# Patient Record
Sex: Male | Born: 1965 | Race: Black or African American | Hispanic: No | Marital: Single | Smoking: Current every day smoker
Health system: Southern US, Community
[De-identification: ages and names within clinical notes are randomized; demographics above are authoritative.]

## PROBLEM LIST (undated history)

## (undated) DIAGNOSIS — A539 Syphilis, unspecified: Secondary | ICD-10-CM

## (undated) DIAGNOSIS — E119 Type 2 diabetes mellitus without complications: Secondary | ICD-10-CM

## (undated) DIAGNOSIS — Z8489 Family history of other specified conditions: Secondary | ICD-10-CM

## (undated) DIAGNOSIS — E785 Hyperlipidemia, unspecified: Secondary | ICD-10-CM

## (undated) HISTORY — PX: NO PAST SURGERIES: SHX2092

---

## 1998-08-17 ENCOUNTER — Emergency Department (HOSPITAL_COMMUNITY): Admission: EM | Admit: 1998-08-17 | Discharge: 1998-08-17 | Payer: Self-pay | Admitting: Emergency Medicine

## 2000-06-22 ENCOUNTER — Emergency Department (HOSPITAL_COMMUNITY): Admission: EM | Admit: 2000-06-22 | Discharge: 2000-06-22 | Payer: Self-pay | Admitting: *Deleted

## 2000-06-23 ENCOUNTER — Emergency Department (HOSPITAL_COMMUNITY): Admission: EM | Admit: 2000-06-23 | Discharge: 2000-06-23 | Payer: Self-pay | Admitting: *Deleted

## 2003-06-04 ENCOUNTER — Emergency Department (HOSPITAL_COMMUNITY): Admission: EM | Admit: 2003-06-04 | Discharge: 2003-06-04 | Payer: Self-pay | Admitting: Emergency Medicine

## 2005-01-08 ENCOUNTER — Emergency Department (HOSPITAL_COMMUNITY): Admission: EM | Admit: 2005-01-08 | Discharge: 2005-01-08 | Payer: Self-pay | Admitting: Emergency Medicine

## 2008-03-11 ENCOUNTER — Emergency Department (HOSPITAL_COMMUNITY): Admission: EM | Admit: 2008-03-11 | Discharge: 2008-03-11 | Payer: Self-pay | Admitting: Emergency Medicine

## 2008-03-12 ENCOUNTER — Emergency Department (HOSPITAL_COMMUNITY): Admission: EM | Admit: 2008-03-12 | Discharge: 2008-03-12 | Payer: Self-pay | Admitting: Emergency Medicine

## 2009-01-18 ENCOUNTER — Emergency Department (HOSPITAL_COMMUNITY): Admission: EM | Admit: 2009-01-18 | Discharge: 2009-01-18 | Payer: Self-pay | Admitting: Emergency Medicine

## 2009-04-18 ENCOUNTER — Emergency Department (HOSPITAL_COMMUNITY): Admission: EM | Admit: 2009-04-18 | Discharge: 2009-04-18 | Payer: Self-pay | Admitting: Emergency Medicine

## 2009-06-05 ENCOUNTER — Emergency Department (HOSPITAL_COMMUNITY): Admission: EM | Admit: 2009-06-05 | Discharge: 2009-06-06 | Payer: Self-pay | Admitting: Emergency Medicine

## 2009-11-07 ENCOUNTER — Emergency Department (HOSPITAL_COMMUNITY): Admission: EM | Admit: 2009-11-07 | Discharge: 2009-11-08 | Payer: Self-pay | Admitting: Emergency Medicine

## 2010-09-24 LAB — CBC
HCT: 44.1 % (ref 39.0–52.0)
Hemoglobin: 15.5 g/dL (ref 13.0–17.0)
MCHC: 35.2 g/dL (ref 30.0–36.0)
MCV: 94.2 fL (ref 78.0–100.0)
Platelets: 221 10*3/uL (ref 150–400)
RBC: 4.68 MIL/uL (ref 4.22–5.81)
RDW: 12.8 % (ref 11.5–15.5)
WBC: 3.8 10*3/uL — ABNORMAL LOW (ref 4.0–10.5)

## 2010-09-24 LAB — DIFFERENTIAL
Basophils Absolute: 0 10*3/uL (ref 0.0–0.1)
Basophils Relative: 1 % (ref 0–1)
Eosinophils Absolute: 0 10*3/uL (ref 0.0–0.7)
Eosinophils Relative: 1 % (ref 0–5)
Lymphocytes Relative: 66 % — ABNORMAL HIGH (ref 12–46)

## 2010-09-24 LAB — COMPREHENSIVE METABOLIC PANEL
ALT: 33 U/L (ref 0–53)
AST: 44 U/L — ABNORMAL HIGH (ref 0–37)
CO2: 23 mEq/L (ref 19–32)
Chloride: 106 mEq/L (ref 96–112)
Creatinine, Ser: 0.93 mg/dL (ref 0.4–1.5)
GFR calc Af Amer: >60 mL/min (ref >60)
GFR calc non Af Amer: >60 mL/min (ref >60)
Total Bilirubin: 0.5 mg/dL (ref 0.3–1.2)

## 2010-09-24 LAB — LIPASE, BLOOD: Lipase: 43 U/L (ref 11–59)

## 2010-10-08 LAB — DIFFERENTIAL
Basophils Absolute: 0.1 K/uL (ref 0.0–0.1)
Basophils Relative: 1 % (ref 0–1)
Eosinophils Absolute: 0.1 K/uL (ref 0.0–0.7)
Eosinophils Relative: 1 % (ref 0–5)
Lymphocytes Relative: 40 % (ref 12–46)
Lymphs Abs: 2.9 K/uL (ref 0.7–4.0)
Monocytes Absolute: 0.6 K/uL (ref 0.1–1.0)
Monocytes Relative: 8 % (ref 3–12)
Neutro Abs: 3.7 K/uL (ref 1.7–7.7)
Neutrophils Relative %: 51 % (ref 43–77)

## 2010-10-08 LAB — COMPREHENSIVE METABOLIC PANEL WITH GFR
ALT: 25 U/L (ref 0–53)
AST: 25 U/L (ref 0–37)
Albumin: 3.9 g/dL (ref 3.5–5.2)
Alkaline Phosphatase: 76 U/L (ref 39–117)
BUN: 6 mg/dL (ref 6–23)
CO2: 22 meq/L (ref 19–32)
Calcium: 9 mg/dL (ref 8.4–10.5)
Chloride: 106 meq/L (ref 96–112)
Creatinine, Ser: 0.93 mg/dL (ref 0.4–1.5)
GFR calc non Af Amer: >60 mL/min
Glucose, Bld: 106 mg/dL — ABNORMAL HIGH (ref 70–99)
Potassium: 3.7 meq/L (ref 3.5–5.1)
Sodium: 140 meq/L (ref 135–145)
Total Bilirubin: 0.4 mg/dL (ref 0.3–1.2)
Total Protein: 7.5 g/dL (ref 6.0–8.3)

## 2010-10-08 LAB — CBC
MCHC: 35.2 g/dL (ref 30.0–36.0)
MCV: 95.8 fL (ref 78.0–100.0)
Platelets: 229 10*3/uL (ref 150–400)
RDW: 13 % (ref 11.5–15.5)

## 2010-10-08 LAB — PROTIME-INR: INR: 0.95 (ref 0.00–1.49)

## 2010-10-08 LAB — LIPASE, BLOOD: Lipase: 35 U/L (ref 11–59)

## 2010-10-09 LAB — URINALYSIS, ROUTINE W REFLEX MICROSCOPIC
Glucose, UA: NEGATIVE mg/dL
Nitrite: NEGATIVE
Protein, ur: NEGATIVE mg/dL
pH: 6 (ref 5.0–8.0)

## 2010-10-10 LAB — WOUND CULTURE

## 2010-10-14 LAB — DIFFERENTIAL
Basophils Relative: 0 % (ref 0–1)
Eosinophils Absolute: 0.1 10*3/uL (ref 0.0–0.7)
Eosinophils Relative: 1 % (ref 0–5)
Lymphs Abs: 1.7 10*3/uL (ref 0.7–4.0)
Monocytes Absolute: 0.3 10*3/uL (ref 0.1–1.0)
Monocytes Relative: 7 % (ref 3–12)
Neutrophils Relative %: 60 % (ref 43–77)

## 2010-10-14 LAB — CBC
HCT: 44 % (ref 39.0–52.0)
MCHC: 34.5 g/dL (ref 30.0–36.0)
MCV: 92.6 fL (ref 78.0–100.0)
RBC: 4.74 MIL/uL (ref 4.22–5.81)

## 2012-02-06 ENCOUNTER — Emergency Department (HOSPITAL_COMMUNITY)
Admission: EM | Admit: 2012-02-06 | Discharge: 2012-02-06 | Disposition: A | Discharge status: Home / Self Care | Payer: Self-pay | Source: Home / Self Care | Attending: Emergency Medicine | Admitting: Emergency Medicine

## 2012-02-06 ENCOUNTER — Encounter (HOSPITAL_COMMUNITY): Payer: Self-pay | Admitting: Family Medicine

## 2012-02-06 DIAGNOSIS — K029 Dental caries, unspecified: Secondary | ICD-10-CM

## 2012-02-06 DIAGNOSIS — K047 Periapical abscess without sinus: Secondary | ICD-10-CM

## 2012-02-06 DIAGNOSIS — F17200 Nicotine dependence, unspecified, uncomplicated: Secondary | ICD-10-CM | POA: Insufficient documentation

## 2012-02-06 MED ORDER — HYDROCODONE-ACETAMINOPHEN 5-500 MG PO TABS: 1.0000 | ORAL_TABLET | Freq: Four times a day (QID) | ORAL | Status: DC | PRN

## 2012-02-06 MED ORDER — PENICILLIN V POTASSIUM 500 MG PO TABS: 500.0000 mg | ORAL_TABLET | Freq: Four times a day (QID) | ORAL | Status: DC

## 2012-02-06 NOTE — Discharge Instructions (Signed)
Abscessed Tooth  A tooth abscess is a collection of infected fluid (pus) from a bacterial infection in the inner part of the tooth (pulp). It usually occurs at the end of the tooth's root.   CAUSES    A very bad cavity (extensive tooth decay).   Trauma to the tooth, such as a broken or chipped tooth, that allows bacteria to enter into the pulp.  SYMPTOMS   Severe pain in and around the infected tooth.   Swelling and redness around the abscessed tooth or in the mouth or face.   Tenderness.   Pus drainage.   Bad breath.   Bitter taste in the mouth.   Difficulty swallowing.   Difficulty opening the mouth.   Feeling sick to your stomach (nauseous).   Vomiting.   Chills.   Swollen neck glands.  DIAGNOSIS   A medical and dental history will be taken.   An examination will be performed by tapping on the abscessed tooth.   X-rays may be taken of the tooth to identify the abscess.  TREATMENT  The goal of treatment is to eliminate the infection.    You may be prescribed antibiotic medicine to stop the infection from spreading.   A root canal may be performed to save the tooth. If the tooth cannot be saved, it may be pulled (extracted) and the abscess may be drained.  HOME CARE INSTRUCTIONS   Only take over-the-counter or prescription medicines for pain, fever, or discomfort as directed by your caregiver.   Do not drive after taking pain medicine (narcotics).   Rinse your mouth (gargle) often with salt water ( tsp salt in 8 oz of warm water) to relieve pain or swelling.   Do not apply heat to the outside of your face.   Return to your dentist for further treatment as directed.  SEEK IMMEDIATE DENTAL CARE IF:   You have a temperature by mouth above 102 F (38.9 C), not controlled by medicine.   You have chills or a very bad headache.   You have problems breathing or swallowing.   Your have trouble opening your mouth.   You develop swelling in the neck or around the eye.   Your pain is not helped  by medicine.   Your pain is getting worse instead of better.  Document Released: 06/23/2005 Document Revised: 06/12/2011 Document Reviewed: 10/01/2010  [employer] Patient Information 2012 [employer]  Dental Pain  A tooth ache may be caused by cavities (tooth decay). Cavities expose the nerve of the tooth to air and hot or cold temperatures. It may come from an infection or abscess (also called a boil or furuncle) around your tooth. It is also often caused by dental caries (tooth decay). This causes the pain you are having.  DIAGNOSIS   Your caregiver can diagnose this problem by exam.  TREATMENT    If caused by an infection, it may be treated with medications which kill germs (antibiotics) and pain medications as prescribed by your caregiver. Take medications as directed.   Only take over-the-counter or prescription medicines for pain, discomfort, or fever as directed by your caregiver.   Whether the tooth ache today is caused by infection or dental disease, you should see your dentist as soon as possible for further care.  SEEK MEDICAL CARE IF:  The exam and treatment you received today has been provided on an emergency basis only. This is not a substitute for complete medical or dental care. If your problem worsens   this location. SEEK IMMEDIATE MEDICAL CARE IF:   You have a fever.   You develop redness and swelling of your face, jaw, or neck.   You are unable to open your mouth.   You have severe pain uncontrolled by pain medicine.  MAKE SURE YOU:   Understand these instructions.   Will watch your condition.   Will get help right away if you are not doing well or get worse.  Document Released: 06/23/2005 Document Revised: 06/12/2011 Document Reviewed: 02/09/2008 [facility] Patient Information 2012 [place], [place].Dental Care and Dentist Visits Dental care  supports good overall health. Regular dental visits can also help you avoid dental pain, bleeding, infection, and other more serious health problems in the future. It is important to keep the mouth healthy because diseases in the teeth, gums, and other oral tissues can spread to other areas of the body. Some problems, such as diabetes, heart disease, and pre-term labor have been associated with poor oral health.  See your dentist every 6 months. If you experience emergency problems such as a toothache or broken tooth, go to the dentist right away. If you see your dentist regularly, you may catch problems early. It is easier to be treated for problems in the early stages.  WHAT TO EXPECT AT A DENTIST VISIT  Your dentist will look for many common oral health problems and recommend proper treatment. At your regular dental visit, you can expect:  Gentle cleaning of the teeth and gums. This includes scraping and polishing. This helps to remove the sticky substance around the teeth and gums (plaque). Plaque forms in the mouth shortly after eating. Over time, plaque hardens on the teeth as tartar. If tartar is not removed regularly, it can cause problems. Cleaning also helps remove stains.   Periodic X-rays. These pictures of the teeth and supporting bone will help your dentist assess the health of your teeth.   Periodic fluoride treatments. Fluoride is a natural mineral shown to help strengthen teeth. Fluoride treatmentinvolves applying a fluoride gel or varnish to the teeth. It is most commonly done in children.   Examination of the mouth, tongue, jaws, teeth, and gums to look for any oral health problems, such as:   Cavities (dental caries). This is decay on the tooth caused by plaque, sugar, and acid in the mouth. It is best to catch a cavity when it is small.   Inflammation of the gums caused by plaque buildup (gingivitis).   Problems with the mouth or malformed or misaligned teeth.   Oral cancer  or other diseases of the soft tissues or jaws.  KEEP YOUR TEETH AND GUMS HEALTHY For healthy teeth and gums, follow these general guidelines as well as your dentist's specific advice:  Have your teeth professionally cleaned at the dentist every 6 months.   Brush twice daily with a fluoride toothpaste.   Floss your teeth daily.   Ask your dentist if you need fluoride supplements, treatments, or fluoride toothpaste.   Eat a healthy diet. Reduce foods and drinks with added sugar.   Avoid smoking.  TREATMENT FOR ORAL HEALTH PROBLEMS If you have oral health problems, treatment varies depending on the conditions present in your teeth and gums.  Your caregiver will most likely recommend good oral hygiene at each visit.   For cavities, gingivitis, or other oral health disease, your caregiver will perform a procedure to treat the problem. This is typically done at a separate appointment. Sometimes your caregiver will refer you to another  dental specialist for specific tooth problems or for surgery.  SEEK IMMEDIATE DENTAL CARE IF:  You have pain, bleeding, or soreness in the gum, tooth, jaw, or mouth area.   A permanent tooth becomes loose or separated from the gum socket.   You experience a blow or injury to the mouth or jaw area.  Document Released: 03/05/2011 Document Revised: 06/12/2011 Document Reviewed: 03/05/2011 [facility] Patient Information 2012 [place], [place].

## 2012-02-06 NOTE — ED Provider Notes (Signed)
History     CSN: [id number]  Arrival date & time 02/06/12  0321   First MD Initiated Contact with Patient 02/06/12 0532      Chief Complaint  Patient presents with   Abscess   Dental Pain    (Consider location/radiation/quality/duration/timing/severity/associated sxs/prior treatment) Patient is a 46 y.o. male presenting with tooth pain. The history is provided by the patient. No language interpreter was used.  Dental PainPrimary symptoms do not include mouth pain, oral bleeding, oral lesions or angioedema. The symptoms are worsening. The symptoms are new.  Additional symptoms do not include: purulent gums, trismus, jaw pain, trouble swallowing, pain with swallowing, smell disturbance and fatigue. Associated symptoms comments: Cheek swelling. Medical issues do not include: chewing tobacco.    History reviewed. No pertinent past medical history.  History reviewed. No pertinent past surgical history.  No family history on file.  History  Substance Use Topics   Smoking status: Current Everyday Smoker -- 1.0 packs/day    Types: Cigarettes, Cigars   Smokeless tobacco: Not on file   Alcohol Use: 7.2 oz/week    12 Cans of beer per week      Review of Systems  Constitutional: Negative for fatigue.  HENT: Negative for trouble swallowing, neck pain and neck stiffness.   All other systems reviewed and are negative.    Allergies  Review of patient's allergies indicates no known allergies.  Home Medications   Current Outpatient Rx  Name Route Sig Dispense Refill   HYDROCODONE-ACETAMINOPHEN 5-500 MG PO TABS Oral Take 1 tablet by mouth every 6 (six) hours as needed for pain. 10 tablet 0   PENICILLIN V POTASSIUM 500 MG PO TABS Oral Take 1 tablet (500 mg total) by mouth 4 (four) times daily. 40 tablet 0    BP 190/94   Pulse 101   Temp 99.4 F (37.4 C) (Oral)   Resp 18   SpO2 100%  Physical Exam  Constitutional: He is oriented to person, place, and time. He appears  well-developed and well-nourished. No distress.  HENT:  Head: Normocephalic and atraumatic.    Mouth/Throat: Oropharynx is clear and moist.         No trismus  Eyes: Conjunctivae are normal. Pupils are equal, round, and reactive to light.  Neck: Normal range of motion. Neck supple.  Cardiovascular: Normal rate and regular rhythm.   Pulmonary/Chest: Effort normal and breath sounds normal. He has no wheezes. He has no rales.  Abdominal: Soft. Bowel sounds are normal. There is no tenderness.  Musculoskeletal: Normal range of motion.  Neurological: He is alert and oriented to person, place, and time.  Skin: Skin is warm and dry.  Psychiatric: He has a normal mood and affect.    ED Course  Procedures (including critical care time)  Labs Reviewed - No data to display No results found.   1. Dental caries   2. Dental abscess       MDM  Follow up with dentist, return for inability to open the mouth fevers worsening swelling or any concerns.  Patient verbalizes understanding and agrees to follow up       Zyshawn Bohnenkamp Smitty Cords, MD 02/06/12 [phone]

## 2012-02-06 NOTE — ED Notes (Addendum)
Patient states "I have an abscess and I think I need antibiotics." Patient states he has a broken tooth left upper molar since  "a while ago."  Has taken Ibuprofen and salt water gargles without relief.

## 2017-08-03 ENCOUNTER — Encounter (HOSPITAL_COMMUNITY): Payer: Self-pay

## 2017-08-03 ENCOUNTER — Other Ambulatory Visit: Payer: Self-pay

## 2017-08-03 ENCOUNTER — Emergency Department (HOSPITAL_COMMUNITY)
Admission: EM | Admit: 2017-08-03 | Discharge: 2017-08-03 | Disposition: A | Discharge status: Home / Self Care | Payer: Self-pay | Attending: Emergency Medicine | Admitting: Emergency Medicine

## 2017-08-03 DIAGNOSIS — R200 Anesthesia of skin: Secondary | ICD-10-CM | POA: Insufficient documentation

## 2017-08-03 DIAGNOSIS — B351 Tinea unguium: Secondary | ICD-10-CM | POA: Insufficient documentation

## 2017-08-03 DIAGNOSIS — B353 Tinea pedis: Secondary | ICD-10-CM

## 2017-08-03 DIAGNOSIS — M791 Myalgia, unspecified site: Secondary | ICD-10-CM | POA: Insufficient documentation

## 2017-08-03 DIAGNOSIS — M79672 Pain in left foot: Secondary | ICD-10-CM | POA: Insufficient documentation

## 2017-08-03 DIAGNOSIS — F17210 Nicotine dependence, cigarettes, uncomplicated: Secondary | ICD-10-CM | POA: Insufficient documentation

## 2017-08-03 DIAGNOSIS — M2550 Pain in unspecified joint: Secondary | ICD-10-CM | POA: Insufficient documentation

## 2017-08-03 MED ORDER — IBUPROFEN 600 MG PO TABS: 600.0000 mg | ORAL_TABLET | Freq: Four times a day (QID) | ORAL | 0 refills | Status: DC | PRN

## 2017-08-03 MED ORDER — TERBINAFINE HCL 1 % EX CREA: 1.0000 "application " | TOPICAL_CREAM | Freq: Two times a day (BID) | CUTANEOUS | 0 refills | Status: DC

## 2017-08-03 NOTE — ED Provider Notes (Signed)
[facility]-EMERGENCY DEPT Provider Note   CSN: [id number] Arrival date & time: 08/03/17  1210     History   Chief Complaint Chief Complaint  Patient presents with  . Foot Pain    bilateral    HPI Tony Hicks is a 52 y.o. male with no known past medical history presenting with bilateral pins and needles sensation in his feet for the last 6 months.  Reports that this is aggravated by wearing his work boots and standing for too long.  His steel toed boots have also been causing him pain in the small toes bilaterally.  This is also aggravated by alcohol. 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 Patient has not been seen by PCP in years.  Denies any weakness, fever, chills, erythema, warmth or other symptoms.  Has not tried anything for his symptoms.   HPI  History reviewed. No pertinent past medical history.  There are no active problems to display for this patient.   History reviewed. No pertinent surgical history.     Home Medications    Prior to Admission medications   Medication Sig Start Date End Date Taking? Authorizing Provider  ibuprofen (ADVIL,MOTRIN) 600 MG tablet Take 1 tablet (600 mg total) by mouth every 6 (six) hours as needed. 08/03/17   Mathews Robinsons B, PA-C  terbinafine (EQ ATHLETES FOOT, TERBINAFINE,) 1 % cream Apply 1 application topically 2 (two) times daily. 08/03/17   Georgiana Shore, PA-C    Family History No family history on file.  Social History Social History   Tobacco Use  . Smoking status: Current Every Day Smoker    Packs/day: 0.50    Types: Cigarettes  . Smokeless tobacco: Never Used  Substance Use Topics  . Alcohol use: Yes    Frequency: Never    Comment: 12 pack of beer a day  . Drug use: Yes    Frequency: 1.0 times per week    Types: Cocaine    Comment: smoke crack     Allergies   Patient has no known allergies.   Review of Systems Review of Systems   Constitutional: Negative for chills and fever.  Respiratory: Negative for shortness of breath.   Cardiovascular: Negative for chest pain and leg swelling.  Musculoskeletal: Positive for arthralgias and myalgias. Negative for joint swelling, neck pain and neck stiffness.  Skin: Negative for color change and pallor.  Neurological: Positive for numbness. Negative for weakness.       Pins and needle sensation in the feet bilaterally intermittently for the last 6 months     Physical Exam Updated Vital Signs BP 129/69   Pulse 78   Temp 98.5 F (36.9 C) (Oral)   Resp 18   Ht 5\' 6"  (1.676 m)   Wt 99.8 kg (220 lb)   SpO2 98%   BMI 35.51 kg/m   Physical Exam  Constitutional: He appears well-developed and well-nourished. No distress.  Afebrile, nontoxic-appearing, sitting comfortably in chair in no acute distress.  HENT:  Head: Normocephalic and atraumatic.  Eyes: Conjunctivae and EOM are normal.  Neck: Normal range of motion. Neck supple.  Cardiovascular: Normal rate and regular rhythm.  No murmur heard. Pulmonary/Chest: Effort normal and breath sounds normal. No respiratory distress.  Abdominal: He exhibits no distension.  Musculoskeletal: Normal range of motion. He exhibits no edema, tenderness or deformity.  Neurological: He is alert. No sensory deficit.  Neurovascularly intact bilaterally  Skin: Skin is warm and dry.  He is not diaphoretic. No erythema. No pallor.  Maceration of the skin between the first and second digit left foot.  Bilateral tinea pedis.  Onychomycosis bilaterally.  Psychiatric: He has a normal mood and affect.  Nursing note and vitals reviewed.    ED Treatments / Results  Labs (all labs ordered are listed, but only abnormal results are displayed) Labs Reviewed - No data to display  EKG  EKG Interpretation None       Radiology No results found.  Procedures Procedures (including critical care time)  Medications Ordered in ED Medications - No  data to display   Initial Impression / Assessment and Plan / ED Course  I have reviewed the triage vital signs and the nursing notes.  Pertinent labs & imaging results that were available during my care of the patient were reviewed by me and considered in my medical decision making (see chart for details).    Patient presenting with bilateral tinea pedis and onychomycosis.  Maceration of the skin between the first and second digit of the left foot.  Will discharge patient home with terbinafine 1% cream and advised to keep the area clean and dry.  Provided patient with a referral to podiatry and the wellness Center for primary care.  Strongly advised to reestablish care with primary care to monitor for any chronic conditions.  Discussed strict return precautions and advised to return to the emergency department if experiencing any new or worsening symptoms. Instructions were understood and patient agreed with discharge plan. Final Clinical Impressions(s) / ED Diagnoses   Final diagnoses:  Tinea pedis, right  Onychomycosis    ED Discharge Orders        Ordered    terbinafine (EQ ATHLETES FOOT, TERBINAFINE,) 1 % cream  2 times daily     08/03/17 1554    ibuprofen (ADVIL,MOTRIN) 600 MG tablet  Every 6 hours PRN     08/03/17 1554       Gregary CromerMitchell, Poseidon Pam B, PA-C 08/03/17 2258    Alvira MondaySchlossman, Erin, MD 08/04/17 2234

## 2017-08-03 NOTE — ED Triage Notes (Signed)
Patient presents with bilateral foot pain, starting last year. Patient reports the pain is a "little pins and needles" feeling that comes and goes. Patient denies history of diabetes. Patient states " I think its gout." Patient denies previous diagnosis of gout. Patient denies taking any OTC pain medication for the pain. Patient denies any injury/wounds to his bilateral feet. Patient ambulates to triage.

## 2017-08-03 NOTE — Discharge Instructions (Signed)
As discussed, keep your feet clean and dry and apply gauze between your toes to maintain distance between the skin to help the area dry and heal.  Use insoles and good support in your boots. Take ibuprofen as needed for pain.  Apply the antifungal cream twice daily. Follow up with Podiatry and the wellness center to establish care with a primary care provider.

## 2019-02-03 ENCOUNTER — Other Ambulatory Visit: Payer: Self-pay

## 2019-02-03 ENCOUNTER — Encounter (HOSPITAL_COMMUNITY): Payer: Self-pay | Admitting: Emergency Medicine

## 2019-02-03 ENCOUNTER — Ambulatory Visit (HOSPITAL_COMMUNITY)
Admission: EM | Admit: 2019-02-03 | Discharge: 2019-02-03 | Disposition: A | Discharge status: Home / Self Care | Payer: BLUE CROSS/BLUE SHIELD | Attending: Family Medicine | Admitting: Family Medicine

## 2019-02-03 DIAGNOSIS — R202 Paresthesia of skin: Secondary | ICD-10-CM | POA: Diagnosis not present

## 2019-02-03 DIAGNOSIS — B351 Tinea unguium: Secondary | ICD-10-CM

## 2019-02-03 LAB — VITAMIN B12: Vitamin B-12: 287 pg/mL (ref 180–914)

## 2019-02-03 LAB — TSH: TSH: 0.294 u[IU]/mL — ABNORMAL LOW (ref 0.350–4.500)

## 2019-02-03 LAB — GLUCOSE, CAPILLARY: Glucose-Capillary: 84 mg/dL (ref 70–99)

## 2019-02-03 MED ORDER — TERBINAFINE HCL 250 MG PO TABS: 250.0000 mg | ORAL_TABLET | Freq: Every day | ORAL | 0 refills | Status: DC

## 2019-02-03 NOTE — ED Provider Notes (Signed)
[facility]    CSN: [id number] Arrival date & time: 02/03/19  1555     History   Chief Complaint Chief Complaint  Patient presents with  . Numbness    bilateral feet    HPI Tony Hicks is a 53 y.o. male.   Initial [facility] visit for this 53 yo man.  Pt here for numbness and pins and needles in both feet.  Pt was seen over a year ago in our ED for the same thing.  [facility]  Patient works [employer].  He has pins and needles feeling all the time on soles of feet, worse with weight bearing.  No hand rash or symptoms.     History reviewed. No pertinent past medical history.  There are no active problems to display for this patient.   History reviewed. No pertinent surgical history.     Home Medications    Prior to Admission medications   Medication Sig Start Date End Date Taking? Authorizing Provider  ibuprofen (ADVIL,MOTRIN) 600 MG tablet Take 1 tablet (600 mg total) by mouth every 6 (six) hours as needed. 08/03/17   Mathews RobinsonsMitchell, Jessica B, PA-C  terbinafine (LAMISIL) 250 MG tablet Take 1 tablet (250 mg total) by mouth daily. 02/03/19   Elvina SidleLauenstein, Case Vassell, MD    Family History History reviewed. No pertinent family history.  Social History Social History   Tobacco Use  . Smoking status: Current Every Day Smoker    Packs/day: 0.50    Types: Cigarettes  . Smokeless tobacco: Never Used  Substance Use Topics  . Alcohol use: Yes    Frequency: Never    Comment: 12 pack of beer a day  . Drug use: Yes    Frequency: 1.0 times per week    Types: Cocaine    Comment: smoke crack     Allergies   Patient has no known allergies.   Review of Systems Review of Systems   Physical Exam Triage Vital Signs ED Triage Vitals  Enc Vitals Group     BP 02/03/19 1624 129/68     Pulse Rate 02/03/19 1624 74     Resp 02/03/19 1624 12     Temp 02/03/19 1624 97.7 F (36.5 C)     Temp Source 02/03/19  1624 Oral     SpO2 02/03/19 1624 98 %     Weight --      Height --      Head Circumference --      Peak Flow --      Pain Score 02/03/19 1622 10     Pain Loc --      Pain Edu? --      Excl. in GC? --    No data found.  Updated Vital Signs BP 129/68 (BP Location: Right Arm)   Pulse 74   Temp 97.7 F (36.5 C) (Oral)   Resp 12   SpO2 98%    Physical Exam Vitals signs and nursing note reviewed.  Constitutional:      Appearance: Normal appearance.  HENT:     Head: Normocephalic and atraumatic.     Nose: Nose normal.  Eyes:     Conjunctiva/sclera: Conjunctivae normal.     Pupils: Pupils are equal, round, and reactive to light.  Neck:     Musculoskeletal: Normal range of motion and neck supple.  Cardiovascular:     Rate and Rhythm: Normal rate.  Pulmonary:  Effort: Pulmonary effort is normal.  Musculoskeletal: Normal range of motion.  Skin:    Comments: Thickened soles of feet with some fissuring onychomycotic nails diffusely  Neurological:     Mental Status: He is alert.      UC Treatments / Results  Labs (all labs ordered are listed, but only abnormal results are displayed) Labs Reviewed  GLUCOSE, CAPILLARY  RPR  VITAMIN B12  TSH  CBG MONITORING, ED    EKG   Radiology No results found.  Procedures Procedures (including critical care time)  Medications Ordered in UC Medications - No data to display  Initial Impression / Assessment and Plan / UC Course  I have reviewed the triage vital signs and the nursing notes.  Pertinent labs & imaging results that were available during my care of the patient were reviewed by me and considered in my medical decision making (see chart for details).    Final Clinical Impressions(s) / UC Diagnoses   Final diagnoses:  Paresthesia  Onychomycosis     Discharge Instructions     I am prescribing an antifungal tablet that you should take once a day.  We will contact you if any of your tests are  abnormal.    ED Prescriptions    Medication Sig Dispense Auth. Provider   terbinafine (LAMISIL) 250 MG tablet Take 1 tablet (250 mg total) by mouth daily. 90 tablet Robyn Haber, MD     Controlled Substance Prescriptions [place] Controlled Substance Registry consulted? Not Applicable   Robyn Haber, MD 02/03/19 1714

## 2019-02-03 NOTE — Discharge Instructions (Addendum)
I am prescribing an antifungal tablet that you should take once a day.  We will contact you if any of your tests are abnormal.

## 2019-02-03 NOTE — ED Triage Notes (Signed)
Pt here for numbness and pins and needles in both feet.  Pt was seen over a year ago in our ED for the same thing.  Pt did not follow up with [facility].

## 2019-02-04 ENCOUNTER — Telehealth (HOSPITAL_COMMUNITY): Payer: Self-pay | Admitting: Emergency Medicine

## 2019-02-04 NOTE — Telephone Encounter (Signed)
Per Dr. Lanny Cramp, pt needs a PCP and to follow up in 4-6 weeks for a recheck of blood levels and further evaluation of numbness in feet. Patient contacted and made aware of    results, all questions answered Given info for PCP follow up.

## 2019-02-07 LAB — RPR, QUANT+TP ABS (REFLEX)
Rapid Plasma Reagin, Quant: 1:32 {titer} — ABNORMAL HIGH
T Pallidum Abs: REACTIVE — AB

## 2019-02-07 LAB — RPR: RPR Ser Ql: REACTIVE — AB

## 2019-02-08 ENCOUNTER — Telehealth (HOSPITAL_COMMUNITY): Payer: Self-pay | Admitting: Emergency Medicine

## 2019-02-08 NOTE — Telephone Encounter (Signed)
Patient contacted and made aware of    results, all questions answered Will return today for treatment.

## 2019-02-08 NOTE — Telephone Encounter (Signed)
Pt needs to return for treatment, Attempted to reach patient. No answer at this time. Voicemail left.

## 2019-02-09 ENCOUNTER — Telehealth (HOSPITAL_COMMUNITY): Payer: Self-pay | Admitting: Emergency Medicine

## 2019-02-09 NOTE — Telephone Encounter (Signed)
Pt did not return for treatment, called and spoke with him again, he will attempt to return today.

## 2019-02-10 ENCOUNTER — Telehealth (HOSPITAL_COMMUNITY): Payer: Self-pay | Admitting: Emergency Medicine

## 2019-02-10 NOTE — Telephone Encounter (Signed)
Pt did not return for treatment, notified health dept. Attempted to reach patient. No answer at this time. Voicemail left.   Will send letter

## 2019-02-10 NOTE — Telephone Encounter (Signed)
If patient returns for treatment, he needs two shots of Bicillin, 2.4 million units.

## 2019-02-11 ENCOUNTER — Telehealth: Payer: Self-pay

## 2019-02-11 NOTE — Telephone Encounter (Signed)

## 2019-02-14 ENCOUNTER — Inpatient Hospital Stay: Payer: BLUE CROSS/BLUE SHIELD | Admitting: Family Medicine

## 2019-02-14 ENCOUNTER — Ambulatory Visit (HOSPITAL_COMMUNITY)
Admission: EM | Admit: 2019-02-14 | Discharge: 2019-02-14 | Disposition: A | Discharge status: Home / Self Care | Payer: BLUE CROSS/BLUE SHIELD | Attending: Internal Medicine | Admitting: Internal Medicine

## 2019-02-14 DIAGNOSIS — A64 Unspecified sexually transmitted disease: Secondary | ICD-10-CM

## 2019-02-14 MED ORDER — PENICILLIN G BENZATHINE 1200000 UNIT/2ML IM SUSP
2.4000 10*6.[IU] | Freq: Once | INTRAMUSCULAR | Status: AC
  Administered 2019-02-14: 2.4 10*6.[IU] via INTRAMUSCULAR

## 2019-02-14 MED ORDER — PENICILLIN G BENZATHINE 1200000 UNIT/2ML IM SUSP
INTRAMUSCULAR | Status: AC
  Filled 2019-02-14: qty 4

## 2019-02-14 NOTE — ED Notes (Signed)
Pt presents for STD treatment.  °

## 2019-02-17 ENCOUNTER — Ambulatory Visit (INDEPENDENT_AMBULATORY_CARE_PROVIDER_SITE_OTHER): Payer: BLUE CROSS/BLUE SHIELD | Admitting: Family Medicine

## 2019-02-17 ENCOUNTER — Encounter: Payer: Self-pay | Admitting: Family Medicine

## 2019-02-17 ENCOUNTER — Telehealth (HOSPITAL_COMMUNITY): Payer: Self-pay | Admitting: Emergency Medicine

## 2019-02-17 ENCOUNTER — Other Ambulatory Visit: Payer: Self-pay

## 2019-02-17 VITALS — BP 129/80 | HR 90 | Temp 97.3°F | Resp 17 | Ht 67.0 in | Wt 208.8 lb

## 2019-02-17 DIAGNOSIS — G621 Alcoholic polyneuropathy: Secondary | ICD-10-CM

## 2019-02-17 DIAGNOSIS — F1010 Alcohol abuse, uncomplicated: Secondary | ICD-10-CM

## 2019-02-17 DIAGNOSIS — Z131 Encounter for screening for diabetes mellitus: Secondary | ICD-10-CM | POA: Diagnosis not present

## 2019-02-17 DIAGNOSIS — F10188 Alcohol abuse with other alcohol-induced disorder: Secondary | ICD-10-CM

## 2019-02-17 DIAGNOSIS — A539 Syphilis, unspecified: Secondary | ICD-10-CM

## 2019-02-17 MED ORDER — GABAPENTIN 300 MG PO CAPS: 300.0000 mg | ORAL_CAPSULE | Freq: Two times a day (BID) | ORAL | 3 refills | Status: DC

## 2019-02-17 NOTE — Progress Notes (Signed)
Subjective:  Patient ID: Tony Hicks Alguire, male    DOB: 1966-03-06  Age: 53 y.o. MRN: [id number]  CC: Establish Care and Foot Problem   HPI Tony Hicks Tony Hicks is a [age] year male with a history of alcohol abuse who presents with a one year history of bilateral feet paresthesia and denies a history of Diabetes Mellitus. Symptoms sometimes prevent him from putting on shoes. He endorses significant alcohol consumption - 12 pack/day. He denies leg pains or recent falls and has no back pain.  Review of his chart reveal an Urgent care visit and labs revealed normal B12 level, diagnosis of Syphilis with RPR of 1:32. Notes reveal this was reported to the Health Department and the patient returned to Urgent Care on 02/14/19 where he received 2.4 million units of IM Benzathine Penicillin. He has no ocular symptoms, neck pain or other neurological symptoms.  He has no additional concerns today.  History reviewed. No pertinent past medical history.  Past Surgical History:  Procedure Laterality Date  . NO PAST SURGERIES      Family History  Family history unknown: Yes    No Known Allergies  Outpatient Medications Prior to Visit  Medication Sig Dispense Refill  . terbinafine (LAMISIL) 250 MG tablet Take 1 tablet (250 mg total) by mouth daily. 90 tablet 0  . ibuprofen (ADVIL,MOTRIN) 600 MG tablet Take 1 tablet (600 mg total) by mouth every 6 (six) hours as needed. 30 tablet 0   No facility-administered medications prior to visit.      ROS Review of Systems  Constitutional: Negative for activity change and appetite change.  HENT: Negative for sinus pressure and sore throat.   Eyes: Negative for visual disturbance.  Respiratory: Negative for cough, chest tightness and shortness of breath.   Cardiovascular: Negative for chest pain and leg swelling.  Gastrointestinal: Negative for abdominal distention, abdominal pain, constipation and diarrhea.  Endocrine: Negative.   Genitourinary: Negative for  dysuria.  Musculoskeletal: Negative for joint swelling and myalgias.  Skin: Negative for rash.  Allergic/Immunologic: Negative.   Neurological: Positive for numbness. Negative for weakness and light-headedness.  Psychiatric/Behavioral: Negative for dysphoric mood and suicidal ideas.    Objective:  BP 129/80   Pulse 90   Temp (!) 97.3 F (36.3 C) (Temporal)   Resp 17   Ht 5\' 7"  (1.702 m)   Wt 208 lb 12.8 oz (94.7 kg)   SpO2 96%   BMI 32.70 kg/m   BP/Weight 02/17/2019 02/14/2019 02/03/2019  Systolic BP 129 154 129  Diastolic BP 80 81 68  Wt. (Lbs) 208.8 - -  BMI 32.7 - -      Physical Exam Constitutional:      Appearance: He is well-developed.  Cardiovascular:     Rate and Rhythm: Normal rate.     Heart sounds: Normal heart sounds. No murmur.  Pulmonary:     Effort: Pulmonary effort is normal.     Breath sounds: Normal breath sounds. No wheezing or rales.  Chest:     Chest wall: No tenderness.  Abdominal:     General: Bowel sounds are normal. There is no distension.     Palpations: Abdomen is soft. There is no mass.     Tenderness: There is no abdominal tenderness.  Musculoskeletal:     Comments: Feet: Extensive callus formation with fissuring of heels Abnormal sensation b/l  Neurological:     Mental Status: He is alert and oriented to person, place, and time.  Assessment & Plan:   1. Neuropathy, alcoholic ([place]) Counseled on alcohol cessation Will screen for Diabetes Mellitus Commenced on Gabapentin He is on Lamisil for Tinea Pedis - Basic Metabolic Panel - CBC with Differential/Platelet - gabapentin (NEURONTIN) 300 MG capsule; Take 1 capsule (300 mg total) by mouth 2 (two) times daily.  Dispense: 60 capsule; Refill: 3  2. Syphilis Treated with Benzathine PCN on 02/14/19 at Urgent Care He will need repeat RPR in 6 months for monitoring  3. Alcohol abuse See #1 above  4. Screening for diabetes mellitus - Hemoglobin A1c    Meds ordered this  encounter  Medications  . gabapentin (NEURONTIN) 300 MG capsule    Sig: Take 1 capsule (300 mg total) by mouth 2 (two) times daily.    Dispense:  60 capsule    Refill:  3    Follow-up: Return in about 6 months (around 08/20/2019) for for RPR monitoring.       Charlott Rakes, MD, FAAFP. [facility] [place], [place]   02/17/2019, 3:38 PM

## 2019-02-17 NOTE — Progress Notes (Signed)
Patient here with c/o of numbness/tingling in B feet x 1 year

## 2019-02-18 ENCOUNTER — Other Ambulatory Visit: Payer: Self-pay | Admitting: Family Medicine

## 2019-02-18 DIAGNOSIS — R7303 Prediabetes: Secondary | ICD-10-CM | POA: Insufficient documentation

## 2019-02-18 DIAGNOSIS — G621 Alcoholic polyneuropathy: Secondary | ICD-10-CM

## 2019-02-18 DIAGNOSIS — A539 Syphilis, unspecified: Secondary | ICD-10-CM

## 2019-02-18 LAB — CBC WITH DIFFERENTIAL/PLATELET
Basophils Absolute: 0 10*3/uL (ref 0.0–0.2)
Basos: 1 %
EOS (ABSOLUTE): 0.1 10*3/uL (ref 0.0–0.4)
Eos: 2 %
Hematocrit: 37.7 % (ref 37.5–51.0)
Hemoglobin: 12.5 g/dL — ABNORMAL LOW (ref 13.0–17.7)
Immature Grans (Abs): 0 10*3/uL (ref 0.0–0.1)
Immature Granulocytes: 0 %
Lymphocytes Absolute: 1.6 10*3/uL (ref 0.7–3.1)
Lymphs: 37 %
MCH: 27.2 pg (ref 26.6–33.0)
MCHC: 33.2 g/dL (ref 31.5–35.7)
MCV: 82 fL (ref 79–97)
Monocytes Absolute: 0.5 10*3/uL (ref 0.1–0.9)
Monocytes: 11 %
Neutrophils Absolute: 2.1 10*3/uL (ref 1.4–7.0)
Neutrophils: 49 %
Platelets: 249 10*3/uL (ref 150–450)
RBC: 4.6 x10E6/uL (ref 4.14–5.80)
RDW: 13.7 % (ref 11.6–15.4)
WBC: 4.3 10*3/uL (ref 3.4–10.8)

## 2019-02-18 LAB — BASIC METABOLIC PANEL
BUN/Creatinine Ratio: 8 — ABNORMAL LOW (ref 9–20)
BUN: 8 mg/dL (ref 6–24)
CO2: 24 mmol/L (ref 20–29)
Calcium: 9.2 mg/dL (ref 8.7–10.2)
Chloride: 102 mmol/L (ref 96–106)
Creatinine, Ser: 0.99 mg/dL (ref 0.76–1.27)
GFR calc Af Amer: 101 mL/min/{1.73_m2} (ref >59)
GFR calc non Af Amer: 87 mL/min/{1.73_m2} (ref >59)
Glucose: 100 mg/dL — ABNORMAL HIGH (ref 65–99)
Potassium: 4.5 mmol/L (ref 3.5–5.2)
Sodium: 140 mmol/L (ref 134–144)

## 2019-02-18 LAB — HEMOGLOBIN A1C
Est. average glucose Bld gHb Est-mCnc: 137 mg/dL
Hgb A1c MFr Bld: 6.4 % — ABNORMAL HIGH (ref 4.8–5.6)

## 2019-02-18 MED ORDER — METFORMIN HCL 500 MG PO TABS: 500.0000 mg | ORAL_TABLET | Freq: Every day | ORAL | 6 refills | Status: DC

## 2019-02-18 NOTE — Progress Notes (Signed)
Patient notified of results & recommendations. Expressed understanding.

## 2020-03-02 ENCOUNTER — Ambulatory Visit: Payer: Self-pay | Admitting: Podiatry

## 2020-03-02 ENCOUNTER — Telehealth: Payer: Self-pay | Admitting: Podiatry

## 2020-03-02 NOTE — Telephone Encounter (Signed)
Patient had a scheduled appointment today with Dr. Samuella Cota but he could not make it. He was asking if he could reschedule. Informed him that someone would be in contact to help him.

## 2020-03-07 ENCOUNTER — Other Ambulatory Visit: Payer: Self-pay | Admitting: Family Medicine

## 2020-03-07 ENCOUNTER — Telehealth: Payer: Self-pay | Admitting: Podiatry

## 2020-03-07 NOTE — Telephone Encounter (Signed)
Called patient, lvm to call the office to r/s appointment

## 2020-03-07 NOTE — Telephone Encounter (Signed)
Requested medication (s) are due for refill today: yes  Requested medication (s) are on the active medication list: yes   Last refill:  02/06/2020  Future visit scheduled: no  Notes to clinic:  overdue for follow up appointment    Requested Prescriptions  Pending Prescriptions Disp Refills   metFORMIN (GLUCOPHAGE) 500 MG tablet [Pharmacy Med Name: METFORMIN HCL 500 MG TABLET] 30 tablet 4    Sig: TAKE 1 TABLET BY MOUTH EVERY DAY WITH BREAKFAST      There is no refill protocol information for this order

## 2020-05-14 ENCOUNTER — Ambulatory Visit: Payer: Self-pay | Attending: Internal Medicine

## 2020-05-14 DIAGNOSIS — Z23 Encounter for immunization: Secondary | ICD-10-CM

## 2020-05-14 NOTE — Progress Notes (Signed)
   Covid-19 Vaccination Clinic  Name:  Tony Hicks    MRN: [id number] DOB: 10/21/1965  05/14/2020  [name] was observed post Covid-19 immunization for 15 minutes without incident. He was provided with Vaccine Information Sheet and instruction to access the V-Safe system.   [name] was instructed to call 911 with any severe reactions post vaccine: Marland Kitchen Difficulty breathing  . Swelling of face and throat  . A fast heartbeat  . A bad rash all over body  . Dizziness and weakness   Immunizations Administered    Name Date Dose VIS Date Route   Moderna COVID-19 Vaccine 05/14/2020  1:16 PM 0.5 mL 04/25/2020 Intramuscular   Manufacturer: Moderna   Lot: [id number]   NDC: 32023-343-56

## 2020-06-11 ENCOUNTER — Ambulatory Visit: Payer: Self-pay

## 2020-07-10 ENCOUNTER — Other Ambulatory Visit: Payer: Self-pay | Admitting: Family Medicine

## 2020-07-10 DIAGNOSIS — G621 Alcoholic polyneuropathy: Secondary | ICD-10-CM

## 2020-07-17 ENCOUNTER — Ambulatory Visit: Payer: Self-pay | Attending: Internal Medicine

## 2020-07-17 DIAGNOSIS — Z23 Encounter for immunization: Secondary | ICD-10-CM

## 2020-07-17 NOTE — Progress Notes (Signed)
   Covid-19 Vaccination Clinic  Name:  Tony Hicks    MRN: [id number] DOB: 1965-08-29  07/17/2020  Mr. Ciavarella was observed post Covid-19 immunization for 15 minutes without incident. He was provided with Vaccine Information Sheet and instruction to access the V-Safe system.   Mr. Divita was instructed to call 911 with any severe reactions post vaccine: Marland Kitchen Difficulty breathing  . Swelling of face and throat  . A fast heartbeat  . A bad rash all over body  . Dizziness and weakness   Immunizations Administered    Name Date Dose VIS Date Route   Moderna COVID-19 Vaccine 07/17/2020  4:03 PM 0.5 mL 04/25/2020 Intramuscular   Manufacturer: Gala Murdoch   Lot: [id number]   NDC: 29518-841-66

## 2020-09-12 ENCOUNTER — Ambulatory Visit (HOSPITAL_COMMUNITY)
Admission: EM | Admit: 2020-09-12 | Discharge: 2020-09-12 | Disposition: A | Discharge status: Home / Self Care | Payer: Self-pay | Attending: Family Medicine | Admitting: Family Medicine

## 2020-09-12 ENCOUNTER — Other Ambulatory Visit: Payer: Self-pay

## 2020-09-12 ENCOUNTER — Encounter (HOSPITAL_COMMUNITY): Payer: Self-pay

## 2020-09-12 DIAGNOSIS — L84 Corns and callosities: Secondary | ICD-10-CM | POA: Insufficient documentation

## 2020-09-12 DIAGNOSIS — M79672 Pain in left foot: Secondary | ICD-10-CM | POA: Insufficient documentation

## 2020-09-12 DIAGNOSIS — B356 Tinea cruris: Secondary | ICD-10-CM | POA: Insufficient documentation

## 2020-09-12 DIAGNOSIS — M79671 Pain in right foot: Secondary | ICD-10-CM | POA: Insufficient documentation

## 2020-09-12 HISTORY — DX: Type 2 diabetes mellitus without complications: E11.9

## 2020-09-12 NOTE — ED Triage Notes (Addendum)
Pt in with c/o bilateral rash on his groin that he noticed about 2 days ago, requesting std testing Pt states he applied cream but it was burning  Also c/o bilateral foot pain that has been going on for over 1 year now

## 2020-09-12 NOTE — Discharge Instructions (Signed)
You may use over the counter CLOTRIMAZOLE cream twice daily for the next 1-2 weeks. This should treat your jock itch.  We have sent testing for sexually transmitted infections. We will notify you of any positive results once they are received. If required, we will prescribe any medications you might need.  Please refrain from all sexual activity for at least the next seven days.

## 2020-09-13 ENCOUNTER — Other Ambulatory Visit: Payer: Self-pay | Admitting: Family Medicine

## 2020-09-13 LAB — CYTOLOGY, (ORAL, ANAL, URETHRAL) ANCILLARY ONLY
Chlamydia: NEGATIVE
Comment: NEGATIVE
Comment: NEGATIVE
Comment: NORMAL
Neisseria Gonorrhea: NEGATIVE
Trichomonas: NEGATIVE

## 2020-09-13 NOTE — Telephone Encounter (Signed)
Requested medication (s) are due for refill today: Yes  Requested medication (s) are on the active medication list: Yes  Last refill:  02/18/2019  Future visit scheduled: No  Notes to clinic:  Unable to refill per protocol, Rx expired, appointment needed.      Requested Prescriptions  Pending Prescriptions Disp Refills   metFORMIN (GLUCOPHAGE) 500 MG tablet [Pharmacy Med Name: METFORMIN HCL 500 MG TABLET] 30 tablet 4    Sig: TAKE 1 TABLET BY MOUTH EVERY DAY WITH BREAKFAST      Endocrinology:  Diabetes - Biguanides Failed - 09/13/2020 12:11 PM      Failed - Cr in normal range and within 360 days    Creatinine, Ser  Date Value Ref Range Status  02/17/2019 0.99 0.76 - 1.27 mg/dL Final          Failed - HBA1C is between 0 and 7.9 and within 180 days    Hgb A1c MFr Bld  Date Value Ref Range Status  02/17/2019 6.4 (H) 4.8 - 5.6 % Final    Comment:             Prediabetes: 5.7 - 6.4          Diabetes: >6.4          Glycemic control for adults with diabetes: <7.0           Failed - AA eGFR in normal range and within 360 days    GFR calc Af Amer  Date Value Ref Range Status  02/17/2019 101 >59 mL/min/1.73 Final   GFR calc non Af Amer  Date Value Ref Range Status  02/17/2019 87 >59 mL/min/1.73 Final          Failed - Valid encounter within last 6 months    Recent Outpatient Visits   None

## 2020-09-13 NOTE — Telephone Encounter (Signed)
Patient called, no answer, mailbox is full.  

## 2020-09-15 NOTE — ED Provider Notes (Signed)
  [facility]   [id number] 09/12/20 Arrival Time: 1314  ASSESSMENT & PLAN:  1. Tinea cruris   2. Bilateral foot pain   3. Callus of foot      Discharge Instructions     You may use over the counter CLOTRIMAZOLE cream twice daily for the next 1-2 weeks. This should treat your jock itch.  We have sent testing for sexually transmitted infections. We will notify you of any positive results once they are received. If required, we will prescribe any medications you might need.  Please refrain from all sexual activity for at least the next seven days.       Follow-up Information    Schedule an appointment as soon as possible for a visit  with [facility]).   Contact information: [address] [place],  [place]  [address]  [phone]               Will follow up with PCP or here if worsening or failing to improve as anticipated. Reviewed expectations re: course of current medical issues. Questions answered. Outlined signs and symptoms indicating need for more acute intervention. Patient verbalized understanding. After Visit Summary given.   SUBJECTIVE:  Tony Hicks is a 55 y.o. male who reports:  1) Rash in groin; few days; itches and burns. Afebrile. No urinary symptoms. No open sores. No OTC tx.  2) Bilateral foot pain; years; affecting work now. No injuries. No extremity sensation changes or weakness.     OBJECTIVE: Vitals:   09/12/20 1347  BP: (!) 145/77  Pulse: 100  Resp: 19  Temp: 98 F (36.7 C)  SpO2: 98%    General appearance: alert; no distress HEENT: East ; AT Neck: supple with FROM Extremities: no edema; moves all extremities normally Skin: warm and dry; signs of infection: tinea cruris present in groin and on scrotum MSK: both feel are heavily calloused and very dry Psychological: alert and cooperative; normal mood and affect  No Known Allergies  Past Medical History:  Diagnosis Date  . Diabetes  mellitus without complication [facility])    Social History   Socioeconomic History  . Marital status: Single    Spouse name: Not on file  . Number of children: Not on file  . Years of education: Not on file  . Highest education level: Not on file  Occupational History  . Not on file  Tobacco Use  . Smoking status: Current Every Day Smoker    Packs/day: 0.50    Types: Cigarettes  . Smokeless tobacco: Never Used  Substance and Sexual Activity  . Alcohol use: Yes    Comment: 12 pack of beer a day  . Drug use: Yes    Frequency: 1.0 times per week    Types: Cocaine    Comment: smoke crack  . Sexual activity: Not on file  Other Topics Concern  . Not on file  Social History Narrative  . Not on file   Social Determinants of Health   Financial Resource Strain: Not on file  Food Insecurity: Not on file  Transportation Needs: Not on file  Physical Activity: Not on file  Stress: Not on file  Social Connections: Not on file  Intimate Partner Violence: Not on file   Family History  Family history unknown: Yes   Past Surgical History:  Procedure Laterality Date  . NO PAST SURGERIES       Mardella Layman, MD 09/15/20 [phone]

## 2020-09-28 ENCOUNTER — Ambulatory Visit (INDEPENDENT_AMBULATORY_CARE_PROVIDER_SITE_OTHER): Payer: Self-pay | Admitting: Podiatry

## 2020-09-28 ENCOUNTER — Encounter: Payer: Self-pay | Admitting: Podiatry

## 2020-09-28 ENCOUNTER — Other Ambulatory Visit: Payer: Self-pay

## 2020-09-28 DIAGNOSIS — L309 Dermatitis, unspecified: Secondary | ICD-10-CM

## 2020-09-28 DIAGNOSIS — L84 Corns and callosities: Secondary | ICD-10-CM

## 2020-10-01 NOTE — Progress Notes (Signed)
Subjective:   Patient ID: Tony Hicks, male   DOB: 55 y.o.   MRN: [id number]   HPI Patient presents with chronic lesions on both feet and dry cracked skin x6 months and states that these lesions can get sore.  He does smoke 1/2 pack/day and is not in good hygiene currently   Review of Systems  All other systems reviewed and are negative.       Objective:  Physical Exam Vitals and nursing note reviewed.  Constitutional:      Appearance: He is well-developed.  Pulmonary:     Effort: Pulmonary effort is normal.  Musculoskeletal:        General: Normal range of motion.  Skin:    General: Skin is warm.  Neurological:     Mental Status: He is alert.     Neurovascular status was found to be intact muscle strength was found to be diminishment and diminishment sharp dull vibratory bilateral.  Severe thickness of the underlying tissue bilateral with patient found to have multiple lesion formation left over right that are painful when pressed     Assessment:  Very poor health individual history of diabetic neuropathy with chronic lesions dry skin     Plan:  H&P reviewed conditions and at this point as best as I could I tried to help by debriding the lesion discussing Vaseline under occlusion and other treatments he can do.  This is a very difficult problem and I do not see where there is good to be a good solution for him and unfortunately he is can have to deal with this on an ongoing basis

## 2020-10-29 ENCOUNTER — Ambulatory Visit: Admission: EM | Admit: 2020-10-29 | Discharge: 2020-10-29 | Discharge status: Absent without leave | Payer: Self-pay

## 2020-11-26 ENCOUNTER — Encounter (HOSPITAL_COMMUNITY): Payer: Self-pay | Admitting: Emergency Medicine

## 2020-11-26 ENCOUNTER — Emergency Department (HOSPITAL_COMMUNITY): Payer: Self-pay

## 2020-11-26 ENCOUNTER — Emergency Department (HOSPITAL_COMMUNITY)
Admission: EM | Admit: 2020-11-26 | Discharge: 2020-11-26 | Disposition: A | Discharge status: Home / Self Care | Payer: Self-pay | Attending: Emergency Medicine | Admitting: Emergency Medicine

## 2020-11-26 DIAGNOSIS — M79672 Pain in left foot: Secondary | ICD-10-CM

## 2020-11-26 DIAGNOSIS — F17210 Nicotine dependence, cigarettes, uncomplicated: Secondary | ICD-10-CM | POA: Insufficient documentation

## 2020-11-26 DIAGNOSIS — Z7984 Long term (current) use of oral hypoglycemic drugs: Secondary | ICD-10-CM | POA: Insufficient documentation

## 2020-11-26 DIAGNOSIS — E1140 Type 2 diabetes mellitus with diabetic neuropathy, unspecified: Secondary | ICD-10-CM | POA: Insufficient documentation

## 2020-11-26 LAB — CBG MONITORING, ED: Glucose-Capillary: 123 mg/dL — ABNORMAL HIGH (ref 70–99)

## 2020-11-26 MED ORDER — BACITRACIN ZINC 500 UNIT/GM EX OINT: 1.0000 "application " | TOPICAL_OINTMENT | Freq: Two times a day (BID) | CUTANEOUS | 0 refills | Status: DC

## 2020-11-26 MED ORDER — CLINDAMYCIN HCL 300 MG PO CAPS: 300.0000 mg | ORAL_CAPSULE | Freq: Three times a day (TID) | ORAL | 0 refills | Status: AC

## 2020-11-26 NOTE — Progress Notes (Signed)
Orthopedic Tech Progress Note Patient Details:  Tony Hicks 1966-06-14 [id number]  Ortho Devices Type of Ortho Device: Crutches,Postop shoe/boot Ortho Device/Splint Location: left Ortho Device/Splint Interventions: Application   Post Interventions Patient Tolerated: Well Instructions Provided: Care of device   Saul Fordyce 11/26/2020, 3:48 PM

## 2020-11-26 NOTE — ED Notes (Signed)
An After Visit Summary was printed and given to the patient. Discharge instructions given and no further questions at this time.  

## 2020-11-26 NOTE — ED Provider Notes (Signed)
[facility]-EMERGENCY DEPT Provider Note   CSN: [id number] Arrival date & time: 11/26/20  1207     History Chief Complaint  Patient presents with  . Foot Pain    Tony Hicks is a 55 y.o. male.  The history is provided by the patient.  Foot Pain This is a new problem. The problem occurs constantly. The problem has not changed since onset.Nothing aggravates the symptoms. Nothing relieves the symptoms. He has tried nothing for the symptoms. The treatment provided no relief.  Patient here with left foot pain.  Ongoing for the last several weeks.  Patient states that he has an area on his heel that is dried and cracked and causing some pain.  Denies any trauma.  No fever.  No chills.     Past Medical History:  Diagnosis Date  . Diabetes mellitus without complication [facility])     Patient Active Problem List   Diagnosis Date Noted  . Prediabetes 02/18/2019  . 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 Syphilis 02/18/2019    Past Surgical History:  Procedure Laterality Date  . NO PAST SURGERIES         Family History  Family history unknown: Yes    Social History   Tobacco Use  . Smoking status: Current Every Day Smoker    Packs/day: 0.50    Types: Cigarettes  . Smokeless tobacco: Never Used  Substance Use Topics  . Alcohol use: Yes    Comment: 12 pack of beer a day  . Drug use: Yes    Frequency: 1.0 times per week    Types: Cocaine    Comment: smoke crack    Home Medications Prior to Admission medications   Medication Sig Start Date End Date Taking? Authorizing Provider  clindamycin (CLEOCIN) 300 MG capsule Take 1 capsule (300 mg total) by mouth 3 (three) times daily for 10 days. 11/26/20 12/06/20 Yes Marque Rademaker, DO  bacitracin ointment Apply 1 application topically 2 (two) times daily. 11/26/20   Xan Ingraham, DO  gabapentin (NEURONTIN) 300 MG capsule Take 1 capsule (300 mg total) by mouth 2 (two) times daily. 02/17/19   Hoy Register,  MD  metFORMIN (GLUCOPHAGE) 500 MG tablet Take 1 tablet (500 mg total) by mouth daily with breakfast. 02/18/19   Hoy Register, MD  terbinafine (LAMISIL) 250 MG tablet Take 1 tablet (250 mg total) by mouth daily. 02/03/19   Elvina Sidle, MD    Allergies    Patient has no known allergies.  Review of Systems   Review of Systems  Constitutional: Negative for fever.  Musculoskeletal: Positive for arthralgias. Negative for back pain, gait problem, joint swelling, myalgias and neck pain.  Skin: Positive for wound.  Neurological: Negative for weakness and numbness.    Physical Exam Updated Vital Signs  ED Triage Vitals  Enc Vitals Group     BP 11/26/20 1215 112/71     Pulse Rate 11/26/20 1215 86     Resp 11/26/20 1215 18     Temp 11/26/20 1215 98.7 F (37.1 C)     Temp Source 11/26/20 1215 Oral     SpO2 11/26/20 1215 99 %     Weight --      Height --      Head Circumference --      Peak Flow --      Pain Score 11/26/20 1213 10     Pain Loc --      Pain Edu? --  Excl. in GC? --     Physical Exam Constitutional:      General: He is not in acute distress.    Appearance: He is not ill-appearing.  Cardiovascular:     Pulses: Normal pulses.  Musculoskeletal:        General: Tenderness present.     Comments: Tenderness to the back of the left heel where there is some skin breakdown but no purulent discharge  Skin:    General: Skin is warm.     Capillary Refill: Capillary refill takes less than 2 seconds.     Findings: No erythema.  Neurological:     General: No focal deficit present.     Mental Status: He is alert.     ED Results / Procedures / Treatments   Labs (all labs ordered are listed, but only abnormal results are displayed) Labs Reviewed  CBG MONITORING, ED - Abnormal; Notable for the following components:      Result Value   Glucose-Capillary 123 (*)    All other components within normal limits    EKG None  Radiology DG Foot Complete  Left  Result Date: 11/26/2020 CLINICAL DATA:  Foot pain EXAM: LEFT FOOT - COMPLETE 3+ VIEW COMPARISON:  None. FINDINGS: There is no evidence of fracture or dislocation. There is no evidence of arthropathy or other focal bone abnormality. Small plantar heel spur. Soft tissues are unremarkable. IMPRESSION: 1. No acute findings. 2. Small plantar heel spur. Electronically Signed   By: Signa Kell M.D.   On: 11/26/2020 13:38    Procedures Procedures   Medications Ordered in ED Medications - No data to display  ED Course  I have reviewed the triage vital signs and the nursing notes.  Pertinent labs & imaging results that were available during my care of the patient were reviewed by me and considered in my medical decision making (see chart for details).    MDM Rules/Calculators/A&P                          Tony Hicks is here with wound to his left foot. Normal vitals. X-ray showed no signs of osteomyelitis or fracture.  Does have a small plantar heel spur.  Has overall chronic appearing wound to the back of the left heel.  No purulent drainage.  Very tender to touch however.  Recommend bacitracin and will start clindamycin.  He does follow with podiatry but wants to follow with a different group.  Referred him to a different podiatrist.  Neurovascularly patient is intact.  Educated about wound care and discharged in ED in good condition.  This chart was dictated using voice recognition software.  Despite best efforts to proofread,  errors can occur which can change the documentation meaning.    Final Clinical Impression(s) / ED Diagnoses Final diagnoses:  Foot pain, left    Rx / DC Orders ED Discharge Orders         Ordered    bacitracin ointment  2 times daily,   Status:  Discontinued        11/26/20 1529    clindamycin (CLEOCIN) 300 MG capsule  3 times daily        11/26/20 1529    bacitracin ointment  2 times daily        11/26/20 1531           Virgina Norfolk,  DO 11/26/20 1532

## 2020-11-26 NOTE — ED Provider Notes (Signed)
Emergency Medicine Provider Triage Evaluation Note  Tony Hicks 55 y.o. male was evaluated in triage.  Pt complains of left foot pain.  He states is been an ongoing issue.  He was seen by [facility] month ago and had his toenails cut.  He states since then, he is continued have pain.  He states the skin on the back of his heel is not dry and cracked and is causing more pain.  No fevers, numbness/weakness.   Review of Systems  Positive: Foot pain Negative: Fever, numbness/weakness.  Physical Exam  BP 134/82   Pulse 70   Temp 98.2 F (36.8 C) (Oral)   Resp 18   Ht 5\' 4"  (1.626 m)   Wt 65.8 kg   SpO2 100%   BMI 24.89 kg/m  Gen:   Awake, no distress   HEENT:  Atraumatic  Resp:  Normal effort Cardiac:  Normal rate.  2+ DP pulse noted to left lower extremity. Abd:   Nondistended, nontender  MSK:   Moves extremities without difficulty  Neuro:  Speech clear   Other:   Dry cracked skin noted to heel.  No surrounding warmth, erythema.  Medical Decision Making  Medically screening exam initiated at 12:17 PM  Appropriate orders placed.  Tony Hicks was informed that the remainder of the evaluation will be completed by another provider, this initial triage assessment does not replace that evaluation. They are counseled that they will need to remain in the ED until the completion of their workup, including full H&P and results of any tests.  Risks of leaving the emergency department prior to completion of treatment were discussed. Patient was advised to inform ED staff if they are leaving before their treatment is complete. The patient acknowledged these risks and time was allowed for questions.     The patient appears stable so that the remainder of the MSE may be completed by another provider.   Clinical Impression  Foot pain   Portions of this note were generated with Dragon dictation software. Dictation errors may occur despite best attempts at proofreading.       Laurita Quint, PA-C 11/26/20 1218    11/28/20, DO 11/26/20 [phone]

## 2020-11-26 NOTE — ED Triage Notes (Signed)
Per patient, states left foot discomfort-needles and pins-patient is diabetic-does not believe PCP when he was told he has diabetic neuropathy--CBG 114

## 2020-11-26 NOTE — Discharge Instructions (Addendum)
Take antibiotic for possible foot infection as prescribed.  Recommend that you keep this area clean and dry.  Wash with soap and water daily.  Recommend using bacitracin ointment as well twice a day and keeping this area covered.  Follow up with [facility]

## 2020-12-21 ENCOUNTER — Encounter (HOSPITAL_COMMUNITY): Payer: Self-pay | Admitting: Emergency Medicine

## 2020-12-21 ENCOUNTER — Other Ambulatory Visit: Payer: Self-pay

## 2020-12-21 ENCOUNTER — Emergency Department (HOSPITAL_COMMUNITY)
Admission: EM | Admit: 2020-12-21 | Discharge: 2020-12-21 | Disposition: A | Discharge status: Home / Self Care | Payer: Self-pay | Attending: Emergency Medicine | Admitting: Emergency Medicine

## 2020-12-21 ENCOUNTER — Emergency Department (HOSPITAL_COMMUNITY): Payer: Self-pay

## 2020-12-21 DIAGNOSIS — E119 Type 2 diabetes mellitus without complications: Secondary | ICD-10-CM | POA: Insufficient documentation

## 2020-12-21 DIAGNOSIS — F17210 Nicotine dependence, cigarettes, uncomplicated: Secondary | ICD-10-CM | POA: Insufficient documentation

## 2020-12-21 DIAGNOSIS — D649 Anemia, unspecified: Secondary | ICD-10-CM

## 2020-12-21 DIAGNOSIS — R079 Chest pain, unspecified: Secondary | ICD-10-CM | POA: Insufficient documentation

## 2020-12-21 DIAGNOSIS — M25542 Pain in joints of left hand: Secondary | ICD-10-CM

## 2020-12-21 DIAGNOSIS — R0602 Shortness of breath: Secondary | ICD-10-CM | POA: Insufficient documentation

## 2020-12-21 DIAGNOSIS — Z7984 Long term (current) use of oral hypoglycemic drugs: Secondary | ICD-10-CM | POA: Insufficient documentation

## 2020-12-21 DIAGNOSIS — R202 Paresthesia of skin: Secondary | ICD-10-CM | POA: Insufficient documentation

## 2020-12-21 DIAGNOSIS — R61 Generalized hyperhidrosis: Secondary | ICD-10-CM | POA: Insufficient documentation

## 2020-12-21 DIAGNOSIS — M79672 Pain in left foot: Secondary | ICD-10-CM | POA: Insufficient documentation

## 2020-12-21 LAB — CBC WITH DIFFERENTIAL/PLATELET
Abs Immature Granulocytes: 0.04 10*3/uL (ref 0.00–0.07)
Basophils Absolute: 0 10*3/uL (ref 0.0–0.1)
Basophils Relative: 0 %
Eosinophils Absolute: 0 10*3/uL (ref 0.0–0.5)
Eosinophils Relative: 0 %
HCT: 27.6 % — ABNORMAL LOW (ref 39.0–52.0)
Hemoglobin: 8.2 g/dL — ABNORMAL LOW (ref 13.0–17.0)
Immature Granulocytes: 0 %
Lymphocytes Relative: 8 %
Lymphs Abs: 0.9 10*3/uL (ref 0.7–4.0)
MCH: 20 pg — ABNORMAL LOW (ref 26.0–34.0)
MCHC: 29.7 g/dL — ABNORMAL LOW (ref 30.0–36.0)
MCV: 67.2 fL — ABNORMAL LOW (ref 80.0–100.0)
Monocytes Absolute: 0.5 10*3/uL (ref 0.1–1.0)
Monocytes Relative: 5 %
Neutro Abs: 9.3 10*3/uL — ABNORMAL HIGH (ref 1.7–7.7)
Neutrophils Relative %: 87 %
Platelets: 283 10*3/uL (ref 150–400)
RBC: 4.11 MIL/uL — ABNORMAL LOW (ref 4.22–5.81)
RDW: 19 % — ABNORMAL HIGH (ref 11.5–15.5)
WBC: 10.8 10*3/uL — ABNORMAL HIGH (ref 4.0–10.5)
nRBC: 0 % (ref 0.0–0.2)

## 2020-12-21 LAB — CBG MONITORING, ED: Glucose-Capillary: 133 mg/dL — ABNORMAL HIGH (ref 70–99)

## 2020-12-21 LAB — BASIC METABOLIC PANEL
Anion gap: 8 (ref 5–15)
BUN: 7 mg/dL (ref 6–20)
CO2: 19 mmol/L — ABNORMAL LOW (ref 22–32)
Calcium: 7.8 mg/dL — ABNORMAL LOW (ref 8.9–10.3)
Chloride: 109 mmol/L (ref 98–111)
Creatinine, Ser: 0.81 mg/dL (ref 0.61–1.24)
GFR, Estimated: >60 mL/min (ref >60)
Glucose, Bld: 101 mg/dL — ABNORMAL HIGH (ref 70–99)
Potassium: 3.2 mmol/L — ABNORMAL LOW (ref 3.5–5.1)
Sodium: 136 mmol/L (ref 135–145)

## 2020-12-21 LAB — TROPONIN I (HIGH SENSITIVITY)
Troponin I (High Sensitivity): 9 ng/L (ref <18)
Troponin I (High Sensitivity): 12 ng/L (ref <18)

## 2020-12-21 MED ORDER — FENTANYL CITRATE (PF) 100 MCG/2ML IJ SOLN
50.0000 ug | Freq: Once | INTRAMUSCULAR | Status: AC
  Administered 2020-12-21: 50 ug via INTRAVENOUS
  Filled 2020-12-21: qty 2

## 2020-12-21 MED ORDER — POTASSIUM CHLORIDE 20 MEQ PO PACK
40.0000 meq | PACK | Freq: Once | ORAL | Status: AC
  Administered 2020-12-21: 40 meq via ORAL
  Filled 2020-12-21: qty 2

## 2020-12-21 MED ORDER — HYDROCODONE-ACETAMINOPHEN 5-325 MG PO TABS
1.0000 | ORAL_TABLET | Freq: Once | ORAL | Status: AC
  Administered 2020-12-21: 1 via ORAL
  Filled 2020-12-21: qty 1

## 2020-12-21 NOTE — Discharge Instructions (Addendum)
I recommend that you call [facility] to schedule an appointment with a primary care doctor who can coordinate your medical care and follow up on the results from your ED visit.  Start using RICE therapy as outlined in this handout for your left hand pain. I recommend wearing the wrist brace at all times while sleeping and while working. You can take Tylenol as needed for pain up to 1000mg  every 6-8 hours.

## 2020-12-21 NOTE — ED Provider Notes (Signed)
[facility] EMERGENCY DEPARTMENT Provider Note   CSN: [id number] Arrival date & time: 12/21/20  1409     History Chief Complaint  Patient presents with  . Near Syncope    Tony Hicks is a 55 y.o. male past medical history of diabetes, presenting via EMS from work.  He states he was at work exerting himself, he works with concrete, when he began having left-sided chest pain and left arm numbness.  With the symptoms he endorses shortness of breath and diaphoresis.  The chest pain felt like pins-and-needles in his left chest, resolved with aspirin.  Left arm numbness persists.  He is having intermittent sharp needlelike pains in the plantar aspect of his left foot that has been ongoing for some time.  He was seen in the ED on 11/26/2020 for chronic appearing wound to the left foot.  He goes to a podiatrist for this.  Was discharged with symptomatic management as well as clindamycin.  He states he just began having worsening pains to the bottom of his foot without new injury.   The history is provided by the patient.      Past Medical History:  Diagnosis Date  . Diabetes mellitus without complication [facility])     Patient Active Problem List   Diagnosis Date Noted  . Prediabetes 02/18/2019  . 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 Syphilis 02/18/2019    Past Surgical History:  Procedure Laterality Date  . NO PAST SURGERIES         Family History  Family history unknown: Yes    Social History   Tobacco Use  . Smoking status: Every Day    Packs/day: 0.50    Pack years: 0.00    Types: Cigarettes  . Smokeless tobacco: Never  Substance Use Topics  . Alcohol use: Yes    Comment: 12 pack of beer a day  . Drug use: Yes    Frequency: 1.0 times per week    Types: Cocaine    Comment: smoke crack    Home Medications Prior to Admission medications   Medication Sig Start Date End Date Taking? Authorizing Provider  bacitracin ointment Apply 1 application  topically 2 (two) times daily. 11/26/20   Curatolo, Adam, DO  gabapentin (NEURONTIN) 300 MG capsule Take 1 capsule (300 mg total) by mouth 2 (two) times daily. 02/17/19   Hoy Register, MD  metFORMIN (GLUCOPHAGE) 500 MG tablet Take 1 tablet (500 mg total) by mouth daily with breakfast. 02/18/19   Hoy Register, MD  terbinafine (LAMISIL) 250 MG tablet Take 1 tablet (250 mg total) by mouth daily. 02/03/19   Elvina Sidle, MD    Allergies    Patient has no known allergies.  Review of Systems   Review of Systems  Cardiovascular:  Positive for chest pain.  Musculoskeletal:  Positive for myalgias.  Neurological:  Positive for numbness.  All other systems reviewed and are negative.  Physical Exam Updated Vital Signs BP (!) 146/77 (BP Location: Right Arm)   Pulse 72   Temp 98 F (36.7 C) (Oral)   Resp 18   Ht 5\' 6"  (1.676 m)   Wt 83.9 kg   SpO2 100%   BMI 29.86 kg/m   Physical Exam Vitals and nursing note reviewed.  Constitutional:      Appearance: He is well-developed. He is diaphoretic.  HENT:     Head: Normocephalic and atraumatic.  Eyes:     Conjunctiva/sclera: Conjunctivae normal.  Cardiovascular:     Rate  and Rhythm: Normal rate and regular rhythm.  Pulmonary:     Effort: Pulmonary effort is normal. No respiratory distress.     Breath sounds: Normal breath sounds.  Chest:     Chest wall: No tenderness.  Abdominal:     General: Bowel sounds are normal.     Palpations: Abdomen is soft.     Tenderness: There is no abdominal tenderness.  Musculoskeletal:     Right lower leg: No edema.     Left lower leg: No edema.     Comments: Long thickened toenails.  Dry scaly feet.  Small darkened wound area to the left plantar aspect of the foot proximal to the second toe.  This area is tender.  No obvious foreign body.  Skin:    General: Skin is warm.  Neurological:     Mental Status: He is alert.  Psychiatric:        Behavior: Behavior normal.    ED Results / Procedures  / Treatments   Labs (all labs ordered are listed, but only abnormal results are displayed) Labs Reviewed  BASIC METABOLIC PANEL  CBC WITH DIFFERENTIAL/PLATELET  CBG MONITORING, ED  TROPONIN I (HIGH SENSITIVITY)    EKG None  Radiology No results found.  Procedures Procedures   Medications Ordered in ED Medications  fentaNYL (SUBLIMAZE) injection 50 mcg (has no administration in time range)    ED Course  I have reviewed the triage vital signs and the nursing notes.  Pertinent labs & imaging results that were available during my care of the patient were reviewed by me and considered in my medical decision making (see chart for details).  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  Pain in his foot as well, followed by podiatry. Follow up XR. Likely will have a low HEAR score. If w/u reassuring, likely d/c with outpatient cards [ZB]  1520 HEAR score 3 [JR]    Clinical Course User Index [JR] Shannelle Alguire, [place] N, PA-C [ZB] Ardeen Fillers, DO   MDM Rules/Calculators/A&P                          Patient with past medical history of diabetes, chronic wound to left foot, presenting for evaluation of chest pain with left arm numbness that began today while exerting himself at work. Pain resolved with full dose of aspirin, now has persisting left arm numbness. Also complains of chronic worsening left foot pain, evaluated for this in May.   Patient is diaphoretic on exam, more so complaining of pain to the left foot which is intermittent needlelike pains.  Heart sounds are normal, lungs are clear.  Initial EKG is nonischemic.  Cardiac work-up initiated with troponin, CBC, BMP, CBG.  Chest x-ray is also ordered and repeat film of the left foot.   Care handed off at shift change to Dr. Wynona Neat, to follow blood work and determine disposition.  Final Clinical Impression(s) / ED Diagnoses Final  diagnoses:  None    Rx / DC Orders ED Discharge Orders     None        Eliyahu Bille, [place] N, PA-C 12/21/20 1505    Tilden Fossa, MD 12/22/20 1338

## 2020-12-21 NOTE — ED Triage Notes (Signed)
Pt BIB [facility] for near syncope with left arm pain. Pain initially radiated to L side of chest. Pt given 324 asa, pain has subsided but pt still endorses pins and needles in L arm and L foot. Pt is a diabetic, did not take meds this am. EMS states pt had injury to L foot several weeks ago.

## 2020-12-21 NOTE — ED Notes (Signed)
Wound care applied to left heel

## 2020-12-21 NOTE — ED Provider Notes (Signed)
  Physical Exam  BP (!) 148/80   Pulse 77   Temp 98 F (36.7 C) (Oral)   Resp 16   Ht 5\' 6"  (1.676 m)   Wt 83.9 kg   SpO2 96%   BMI 29.86 kg/m   Physical Exam Musculoskeletal:     Left forearm: No swelling, deformity, tenderness or bony tenderness.     Left wrist: Tenderness (over the MCP of the 2nd and 5th digits. Full active ROM at these sites) present. No swelling, deformity, effusion, lacerations, bony tenderness, snuff box tenderness or crepitus. Normal range of motion. Normal pulse.    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  Pain in his foot as well, followed by podiatry. Follow up XR. Likely will have a low HEAR score. If w/u reassuring, likely d/c with outpatient cards [ZB]  1520 HEAR score 3 [JR]  1753 Troponin I (High Sensitivity) Delta trop essentially flat (9->12). Do not suspect NSTEMI or equivalent at this time. He continues to be without chest pain. [ZB]  2250 Hgb 8.2 - he says that he has been told that his blood count has been low before.  He denies hematemesis or black, tarry school.  I told him that I would like to do further work-up on this including rectal exam, fecal occult blood testing however he declines and tells me that that is not what he is here for. [ZB]  2252 DG Hand Complete Left Plain films largely unremarkable left hand. [ZB]    Clinical Course User Index [JR] Robinson, 2253 N, PA-C [ZB] [place], DO    MDM   Impression is atraumatic musculoskeletal pain, likely osteoarthritis exacerbated by work Ardeen Fillers.  No evidence of septic arthritis, acute fracture or dislocation.  No wounds or rashes.  Blood work that was ordered reviewed by myself and considered in the decision making.  Hemoglobin 8.2.  Last available labs were over a year ago and his hemoglobin at that time is 12.  When I discussed this finding with the  patient, he was not interested in further work-up at this time.  See above for full details. I strongly recommended he obtain a primary care doctor to follow-up on his anemia and other chronic medical problems.  He requested a new podiatrist which I gave him information for. Treated with p.o. pain medication.  Placed in a wrist splint to wear at work and while sleeping.  I recommended RICE therapy which she was familiar with. Discharged in good condition.       Engineer, structural, DO 12/22/20 0007    12/24/20, MD 12/24/20 [phone]

## 2020-12-21 NOTE — ED Notes (Signed)
This RN called lb regarding pts unresulted CBC. Lab tech stated they would process it.

## 2020-12-21 NOTE — ED Notes (Signed)
Mother Lavella Hammock [phone] would like an update

## 2020-12-25 NOTE — Progress Notes (Signed)
Virtual Visit via Telephone Note  I connected with Tony Hicks, on 12/26/2020 at 1:49 PM by telephone due to the COVID-19 pandemic and verified that I am speaking with the correct person using two identifiers.  Due to current restrictions/limitations of in-office visits due to the COVID-19 pandemic, this scheduled clinical appointment was converted to a telehealth visit.   Consent: I discussed the limitations, risks, security and privacy concerns of performing an evaluation and management service by telephone and the availability of in person appointments. I also discussed with the patient that there may be a patient responsible charge related to this service. The patient expressed understanding and agreed to proceed.   Location of Patient: Home  Location of Provider: [facility]   Persons participating in Telemedicine visit: Tony Hicks Ricky Stabs, NP Margorie John, CMA   History of Present Illness: Tony Hicks is a 55 year-old male who presents to establish care. PMH significant for neuropathy alcoholic, prediabetes, and syphilis.   Current issues and/or concerns: Visit 11/26/2020 at [facility] Emergency Department per DO note: Tony Hicks is here with wound to his left foot. Normal vitals. X-ray showed no signs of osteomyelitis or fracture.  Does have a small plantar heel spur.  Has overall chronic appearing wound to the back of the left heel.  No purulent drainage.  Very tender to touch however.  Recommend bacitracin and will start clindamycin.  He does follow with podiatry but wants to follow with a different group.  Referred him to a different podiatrist.  Neurovascularly patient is intact.  Educated about wound care and discharged in ED in good condition.  Visit 12/21/2020 at [facility] Emergency Department per MD note:  Blood work that was ordered reviewed by myself and considered in the decision making.   Hemoglobin 8.2.  Last available labs were over a year ago and his hemoglobin at that time is 12.  When I discussed this finding with the patient, he was not interested in further work-up at this time.  See above for full details. I strongly recommended he obtain a primary care doctor to follow-up on his anemia and other chronic medical problems.  He requested a new podiatrist which I gave him information for. Treated with p.o. pain medication.  12/26/2020: Reports still having foot and would like medication to help. Reports the antibiotic pills and cream did not help with the pain. Reports he recently went to the foot doctor and doesn't feel that they did anything. Reports they only cut his toenails and removed some dry skin. Would like a new referral to Podiatry.      Past Medical History:  Diagnosis Date   Diabetes mellitus without complication (HCC)    No Known Allergies  Current Outpatient Medications on File Prior to Visit  Medication Sig Dispense Refill   bacitracin ointment Apply 1 application topically 2 (two) times daily. (Patient not taking: Reported on 12/21/2020) 120 g 0   gabapentin (NEURONTIN) 300 MG capsule Take 1 capsule (300 mg total) by mouth 2 (two) times daily. (Patient not taking: Reported on 12/21/2020) 60 capsule 3   metFORMIN (GLUCOPHAGE) 500 MG tablet Take 1 tablet (500 mg total) by mouth daily with breakfast. 30 tablet 6   terbinafine (LAMISIL) 250 MG tablet Take 1 tablet (250 mg total) by mouth daily. (Patient not taking: Reported on 12/21/2020) 90 tablet 0   No current facility-administered medications on file prior to visit.    Observations/Objective:  Alert and oriented x 3. Not in acute distress. Physical examination not completed as this is a telemedicine visit.  Assessment and Plan: 1. Encounter to establish care: - Patient presents today to establish care.  - Return for annual physical examination, labs, and health maintenance. Arrive fasting meaning  having no food for at least 8 hours prior to appointment. You may have only water or black coffee. Please take scheduled medications as normal.  2. Left foot pain: - Ongoing since May 2022.  - Diagnostic x-ray left foot on 11/26/2020 resulted no acute findings and small plantar heel spur. - Diagnostic x-ray left foot on 12/21/2020 resulted soft tissue ulcer along the heel with adjacent soft tissue swelling. No radiographic evidence of osteomyelitis. - Continue Bacitracin ointment as prescribed.  - Ibuprofen as prescribed.  - Follow-up with primary provider as scheduled for in-person office visit.  - Referral to Podiatry for further evaluation and management.  - ibuprofen (ADVIL) 600 MG tablet; Take 1 tablet (600 mg total) by mouth every 8 (eight) hours as needed.  Dispense: 30 tablet; Refill: 0 - bacitracin ointment; Apply 1 application topically 2 (two) times daily.  Dispense: 120 g; Refill: 0 - Ambulatory referral to Podiatry   Follow Up Instructions: Return for annual physical exam. Referral to Podiatry.    Patient was given clear instructions to go to Emergency Department or return to medical center if symptoms don't improve, worsen, or new problems develop.The patient verbalized understanding.  I discussed the assessment and treatment plan with the patient. The patient was provided an opportunity to ask questions and all were answered. The patient agreed with the plan and demonstrated an understanding of the instructions.   The patient was advised to call back or seek an in-person evaluation if the symptoms worsen or if the condition fails to improve as anticipated.    I provided 15 minutes total of non-face-to-face time during this encounter.   Rema Fendt, NP  [facility] [place], [place] [phone] 12/26/2020, 1:49 PM

## 2020-12-26 ENCOUNTER — Other Ambulatory Visit: Payer: Self-pay

## 2020-12-26 ENCOUNTER — Telehealth (INDEPENDENT_AMBULATORY_CARE_PROVIDER_SITE_OTHER): Payer: Self-pay | Admitting: Family

## 2020-12-26 ENCOUNTER — Encounter: Payer: Self-pay | Admitting: Family

## 2020-12-26 DIAGNOSIS — Z7689 Persons encountering health services in other specified circumstances: Secondary | ICD-10-CM

## 2020-12-26 DIAGNOSIS — M79672 Pain in left foot: Secondary | ICD-10-CM

## 2020-12-26 MED ORDER — IBUPROFEN 600 MG PO TABS: 600.0000 mg | ORAL_TABLET | Freq: Three times a day (TID) | ORAL | 0 refills | Status: DC | PRN

## 2020-12-26 MED ORDER — BACITRACIN ZINC 500 UNIT/GM EX OINT: 1.0000 "application " | TOPICAL_OINTMENT | Freq: Two times a day (BID) | CUTANEOUS | 0 refills | Status: DC

## 2020-12-26 NOTE — Progress Notes (Signed)
Pt presents for telemedicine visit to establish care °

## 2021-01-08 ENCOUNTER — Inpatient Hospital Stay (HOSPITAL_COMMUNITY)
Admission: EM | Admit: 2021-01-08 | Discharge: 2021-01-19 | DRG: 853 | Disposition: A | Discharge status: Home / Self Care | Payer: Self-pay | Attending: Family Medicine | Admitting: Family Medicine

## 2021-01-08 ENCOUNTER — Encounter (HOSPITAL_COMMUNITY): Payer: Self-pay | Admitting: Internal Medicine

## 2021-01-08 ENCOUNTER — Other Ambulatory Visit: Payer: Self-pay

## 2021-01-08 ENCOUNTER — Emergency Department (HOSPITAL_COMMUNITY): Payer: Self-pay

## 2021-01-08 DIAGNOSIS — E11621 Type 2 diabetes mellitus with foot ulcer: Secondary | ICD-10-CM

## 2021-01-08 DIAGNOSIS — Z79899 Other long term (current) drug therapy: Secondary | ICD-10-CM

## 2021-01-08 DIAGNOSIS — L97509 Non-pressure chronic ulcer of other part of unspecified foot with unspecified severity: Secondary | ICD-10-CM

## 2021-01-08 DIAGNOSIS — I70262 Atherosclerosis of native arteries of extremities with gangrene, left leg: Secondary | ICD-10-CM | POA: Diagnosis present

## 2021-01-08 DIAGNOSIS — A419 Sepsis, unspecified organism: Secondary | ICD-10-CM

## 2021-01-08 DIAGNOSIS — Z7984 Long term (current) use of oral hypoglycemic drugs: Secondary | ICD-10-CM

## 2021-01-08 DIAGNOSIS — L97429 Non-pressure chronic ulcer of left heel and midfoot with unspecified severity: Secondary | ICD-10-CM | POA: Diagnosis present

## 2021-01-08 DIAGNOSIS — U071 COVID-19: Secondary | ICD-10-CM | POA: Diagnosis present

## 2021-01-08 DIAGNOSIS — E119 Type 2 diabetes mellitus without complications: Secondary | ICD-10-CM

## 2021-01-08 DIAGNOSIS — F17210 Nicotine dependence, cigarettes, uncomplicated: Secondary | ICD-10-CM | POA: Diagnosis present

## 2021-01-08 DIAGNOSIS — E11628 Type 2 diabetes mellitus with other skin complications: Secondary | ICD-10-CM | POA: Diagnosis present

## 2021-01-08 DIAGNOSIS — I739 Peripheral vascular disease, unspecified: Secondary | ICD-10-CM | POA: Diagnosis present

## 2021-01-08 DIAGNOSIS — L03116 Cellulitis of left lower limb: Secondary | ICD-10-CM | POA: Diagnosis present

## 2021-01-08 DIAGNOSIS — D509 Iron deficiency anemia, unspecified: Secondary | ICD-10-CM | POA: Diagnosis present

## 2021-01-08 DIAGNOSIS — E118 Type 2 diabetes mellitus with unspecified complications: Secondary | ICD-10-CM

## 2021-01-08 DIAGNOSIS — E871 Hypo-osmolality and hyponatremia: Secondary | ICD-10-CM

## 2021-01-08 DIAGNOSIS — D62 Acute posthemorrhagic anemia: Secondary | ICD-10-CM | POA: Diagnosis not present

## 2021-01-08 DIAGNOSIS — F1410 Cocaine abuse, uncomplicated: Secondary | ICD-10-CM | POA: Diagnosis present

## 2021-01-08 DIAGNOSIS — F1010 Alcohol abuse, uncomplicated: Secondary | ICD-10-CM | POA: Diagnosis present

## 2021-01-08 DIAGNOSIS — F1910 Other psychoactive substance abuse, uncomplicated: Secondary | ICD-10-CM

## 2021-01-08 DIAGNOSIS — A4189 Other specified sepsis: Principal | ICD-10-CM | POA: Diagnosis present

## 2021-01-08 DIAGNOSIS — E1165 Type 2 diabetes mellitus with hyperglycemia: Secondary | ICD-10-CM | POA: Diagnosis present

## 2021-01-08 DIAGNOSIS — G621 Alcoholic polyneuropathy: Secondary | ICD-10-CM

## 2021-01-08 DIAGNOSIS — E1152 Type 2 diabetes mellitus with diabetic peripheral angiopathy with gangrene: Secondary | ICD-10-CM | POA: Diagnosis present

## 2021-01-08 HISTORY — DX: Syphilis, unspecified: A53.9

## 2021-01-08 LAB — CBC WITH DIFFERENTIAL/PLATELET
Abs Immature Granulocytes: 0.03 10*3/uL (ref 0.00–0.07)
Basophils Absolute: 0.1 10*3/uL (ref 0.0–0.1)
Basophils Relative: 1 %
Eosinophils Absolute: 0.1 10*3/uL (ref 0.0–0.5)
Eosinophils Relative: 1 %
HCT: 27.7 % — ABNORMAL LOW (ref 39.0–52.0)
Hemoglobin: 8.2 g/dL — ABNORMAL LOW (ref 13.0–17.0)
Immature Granulocytes: 0 %
Lymphocytes Relative: 6 %
Lymphs Abs: 0.5 10*3/uL — ABNORMAL LOW (ref 0.7–4.0)
MCH: 19.3 pg — ABNORMAL LOW (ref 26.0–34.0)
MCHC: 29.6 g/dL — ABNORMAL LOW (ref 30.0–36.0)
MCV: 65.2 fL — ABNORMAL LOW (ref 80.0–100.0)
Monocytes Absolute: 0.9 10*3/uL (ref 0.1–1.0)
Monocytes Relative: 11 %
Neutro Abs: 6.3 10*3/uL (ref 1.7–7.7)
Neutrophils Relative %: 81 %
Platelets: 387 10*3/uL (ref 150–400)
RBC: 4.25 MIL/uL (ref 4.22–5.81)
RDW: 19.1 % — ABNORMAL HIGH (ref 11.5–15.5)
WBC: 7.8 10*3/uL (ref 4.0–10.5)
nRBC: 0 % (ref 0.0–0.2)

## 2021-01-08 LAB — COMPREHENSIVE METABOLIC PANEL
ALT: 15 U/L (ref 0–44)
AST: 21 U/L (ref 15–41)
Albumin: 3.2 g/dL — ABNORMAL LOW (ref 3.5–5.0)
Alkaline Phosphatase: 54 U/L (ref 38–126)
Anion gap: 10 (ref 5–15)
BUN: 5 mg/dL — ABNORMAL LOW (ref 6–20)
CO2: 24 mmol/L (ref 22–32)
Calcium: 9.2 mg/dL (ref 8.9–10.3)
Chloride: 97 mmol/L — ABNORMAL LOW (ref 98–111)
Creatinine, Ser: 0.85 mg/dL (ref 0.61–1.24)
GFR, Estimated: >60 mL/min (ref >60)
Glucose, Bld: 175 mg/dL — ABNORMAL HIGH (ref 70–99)
Potassium: 3.6 mmol/L (ref 3.5–5.1)
Sodium: 131 mmol/L — ABNORMAL LOW (ref 135–145)
Total Bilirubin: 0.8 mg/dL (ref 0.3–1.2)
Total Protein: 7.3 g/dL (ref 6.5–8.1)

## 2021-01-08 LAB — C-REACTIVE PROTEIN: CRP: 7.6 mg/dL — ABNORMAL HIGH (ref <1.0)

## 2021-01-08 LAB — IRON AND TIBC
Iron: 14 ug/dL — ABNORMAL LOW (ref 45–182)
Saturation Ratios: 3 % — ABNORMAL LOW (ref 17.9–39.5)
TIBC: 454 ug/dL — ABNORMAL HIGH (ref 250–450)
UIBC: 440 ug/dL

## 2021-01-08 LAB — SARS CORONAVIRUS 2 (TAT 6-24 HRS): SARS Coronavirus 2: POSITIVE — AB

## 2021-01-08 LAB — CBG MONITORING, ED
Glucose-Capillary: 208 mg/dL — ABNORMAL HIGH (ref 70–99)
Glucose-Capillary: 121 mg/dL — ABNORMAL HIGH (ref 70–99)

## 2021-01-08 LAB — LACTIC ACID, PLASMA
Lactic Acid, Venous: 3 mmol/L (ref 0.5–1.9)
Lactic Acid, Venous: 1.6 mmol/L (ref 0.5–1.9)

## 2021-01-08 LAB — FERRITIN: Ferritin: 13 ng/mL — ABNORMAL LOW (ref 24–336)

## 2021-01-08 LAB — HEMOGLOBIN A1C
Hgb A1c MFr Bld: 6.2 % — ABNORMAL HIGH (ref 4.8–5.6)
Mean Plasma Glucose: 131.24 mg/dL

## 2021-01-08 LAB — SEDIMENTATION RATE: Sed Rate: 60 mm/hr — ABNORMAL HIGH (ref 0–16)

## 2021-01-08 LAB — PREALBUMIN: Prealbumin: 15.9 mg/dL — ABNORMAL LOW (ref 18–38)

## 2021-01-08 LAB — HIV ANTIBODY (ROUTINE TESTING W REFLEX): HIV Screen 4th Generation wRfx: NONREACTIVE

## 2021-01-08 MED ORDER — FOLIC ACID 1 MG PO TABS
1.0000 mg | ORAL_TABLET | Freq: Every day | ORAL | Status: DC
  Administered 2021-01-09 – 2021-01-19 (×10): 1 mg via ORAL
  Filled 2021-01-08 (×9): qty 1

## 2021-01-08 MED ORDER — INSULIN ASPART 100 UNIT/ML IJ SOLN
0.0000 [IU] | Freq: Three times a day (TID) | INTRAMUSCULAR | Status: DC
  Administered 2021-01-12 – 2021-01-16 (×7): 1 [IU] via SUBCUTANEOUS
  Administered 2021-01-17 (×2): 2 [IU] via SUBCUTANEOUS
  Administered 2021-01-19: 1 [IU] via SUBCUTANEOUS

## 2021-01-08 MED ORDER — ADULT MULTIVITAMIN W/MINERALS CH
1.0000 | ORAL_TABLET | Freq: Every day | ORAL | Status: DC
  Administered 2021-01-09 – 2021-01-19 (×10): 1 via ORAL
  Filled 2021-01-08 (×10): qty 1

## 2021-01-08 MED ORDER — HYDROCODONE-ACETAMINOPHEN 5-325 MG PO TABS
1.0000 | ORAL_TABLET | Freq: Four times a day (QID) | ORAL | Status: DC | PRN
  Administered 2021-01-09 – 2021-01-18 (×29): 1 via ORAL
  Filled 2021-01-08 (×29): qty 1

## 2021-01-08 MED ORDER — THIAMINE HCL 100 MG/ML IJ SOLN: 100.0000 mg | Freq: Every day | INTRAMUSCULAR | Status: DC

## 2021-01-08 MED ORDER — THIAMINE HCL 100 MG PO TABS
100.0000 mg | ORAL_TABLET | Freq: Every day | ORAL | Status: DC
  Administered 2021-01-09 – 2021-01-19 (×10): 100 mg via ORAL
  Filled 2021-01-08 (×10): qty 1

## 2021-01-08 MED ORDER — GABAPENTIN 300 MG PO CAPS
300.0000 mg | ORAL_CAPSULE | Freq: Two times a day (BID) | ORAL | Status: DC
  Administered 2021-01-08 – 2021-01-19 (×21): 300 mg via ORAL
  Filled 2021-01-08 (×21): qty 1

## 2021-01-08 MED ORDER — LORAZEPAM 2 MG/ML IJ SOLN: 0.0000 mg | Freq: Two times a day (BID) | INTRAMUSCULAR | Status: AC

## 2021-01-08 MED ORDER — LORAZEPAM 2 MG/ML IJ SOLN: 1.0000 mg | INTRAMUSCULAR | Status: AC | PRN

## 2021-01-08 MED ORDER — LORAZEPAM 1 MG PO TABS: 1.0000 mg | ORAL_TABLET | ORAL | Status: AC | PRN

## 2021-01-08 MED ORDER — SODIUM CHLORIDE 0.9% FLUSH
3.0000 mL | Freq: Two times a day (BID) | INTRAVENOUS | Status: DC
  Administered 2021-01-08 – 2021-01-10 (×5): 3 mL via INTRAVENOUS

## 2021-01-08 MED ORDER — LORAZEPAM 2 MG/ML IJ SOLN
0.0000 mg | Freq: Four times a day (QID) | INTRAMUSCULAR | Status: AC
  Administered 2021-01-10: 2 mg via INTRAVENOUS
  Filled 2021-01-08: qty 1

## 2021-01-08 MED ORDER — LACTATED RINGERS IV SOLN: INTRAVENOUS | Status: AC

## 2021-01-08 MED ORDER — SODIUM CHLORIDE 0.9 % IV SOLN
2.0000 g | INTRAVENOUS | Status: DC
  Administered 2021-01-08 – 2021-01-16 (×8): 2 g via INTRAVENOUS
  Filled 2021-01-08 (×2): qty 20
  Filled 2021-01-08 (×2): qty 2
  Filled 2021-01-08: qty 20
  Filled 2021-01-08: qty 2
  Filled 2021-01-08 (×2): qty 20

## 2021-01-08 MED ORDER — SODIUM CHLORIDE 0.9 % IV SOLN
510.0000 mg | Freq: Once | INTRAVENOUS | Status: AC
  Administered 2021-01-09: 510 mg via INTRAVENOUS
  Filled 2021-01-08: qty 17

## 2021-01-08 MED ORDER — MORPHINE SULFATE (PF) 4 MG/ML IV SOLN
4.0000 mg | Freq: Once | INTRAVENOUS | Status: AC
  Administered 2021-01-08: 4 mg via INTRAVENOUS
  Filled 2021-01-08: qty 1

## 2021-01-08 MED ORDER — LACTATED RINGERS IV BOLUS
1000.0000 mL | Freq: Once | INTRAVENOUS | Status: AC
  Administered 2021-01-08: 1000 mL via INTRAVENOUS

## 2021-01-08 MED ORDER — NICOTINE 14 MG/24HR TD PT24
14.0000 mg | MEDICATED_PATCH | Freq: Every day | TRANSDERMAL | Status: DC
  Administered 2021-01-09 – 2021-01-19 (×10): 14 mg via TRANSDERMAL
  Filled 2021-01-08 (×10): qty 1

## 2021-01-08 MED ORDER — ENOXAPARIN SODIUM 40 MG/0.4ML IJ SOSY
40.0000 mg | PREFILLED_SYRINGE | INTRAMUSCULAR | Status: DC
  Administered 2021-01-08 – 2021-01-19 (×10): 40 mg via SUBCUTANEOUS
  Filled 2021-01-08 (×10): qty 0.4

## 2021-01-08 MED ORDER — ACETAMINOPHEN 325 MG PO TABS
650.0000 mg | ORAL_TABLET | Freq: Four times a day (QID) | ORAL | Status: DC | PRN
  Administered 2021-01-08 – 2021-01-09 (×4): 650 mg via ORAL
  Filled 2021-01-08 (×5): qty 2

## 2021-01-08 NOTE — Consult Note (Signed)
Reason for Consult: Ulcers Referring Physician: Dr. Alona Bene  Tony Hicks is an 55 y.o. male.  HPI: 55 year old male presents to the emergency department for concerns of worsening wounds on his left lower extremity as well as concerns for infection.  He states has been on the for the last several weeks.  He was seen by podiatry on September 28, 2020.  At that time was found to have chronic calluses as well as dry skin.  Also he smokes a half a pack a day.  Also has previously reported to the emergency department in May as well as June 2022 for foot pain.  He is admitted today for worsening wounds as well as infection.  Past Medical History:  Diagnosis Date  . Diabetes mellitus without complication (HCC)   . Syphilis    Treated  02/14/2019    Past Surgical History:  Procedure Laterality Date  . NO PAST SURGERIES      Family History  Family history unknown: Yes    Social History:  reports that he has been smoking cigarettes. He has been smoking an average of 0.50 packs per day. He has never used smokeless tobacco. He reports current alcohol use of about 24.0 standard drinks of alcohol per week. He reports current drug use. Frequency: 1.00 time per week. Drug: Cocaine.  Allergies: No Known Allergies  Medications: I have reviewed the patient's current medications.  Results for orders placed or performed during the hospital encounter of 01/08/21 (from the past 48 hour(s))  CBG monitoring, ED     Status: Abnormal   Collection Time: 01/08/21  9:26 AM  Result Value Ref Range   Glucose-Capillary 208 (H) 70 - 99 mg/dL    Comment: Glucose reference range applies only to samples taken after fasting for at least 8 hours.  Comprehensive metabolic panel     Status: Abnormal   Collection Time: 01/08/21  9:35 AM  Result Value Ref Range   Sodium 131 (L) 135 - 145 mmol/L   Potassium 3.6 3.5 - 5.1 mmol/L   Chloride 97 (L) 98 - 111 mmol/L   CO2 24 22 - 32 mmol/L   Glucose, Bld 175 (H) 70 - 99  mg/dL    Comment: Glucose reference range applies only to samples taken after fasting for at least 8 hours.   BUN 5 (L) 6 - 20 mg/dL   Creatinine, Ser 6.29 0.61 - 1.24 mg/dL   Calcium 9.2 8.9 - 52.8 mg/dL   Total Protein 7.3 6.5 - 8.1 g/dL   Albumin 3.2 (L) 3.5 - 5.0 g/dL   AST 21 15 - 41 U/L   ALT 15 0 - 44 U/L   Alkaline Phosphatase 54 38 - 126 U/L   Total Bilirubin 0.8 0.3 - 1.2 mg/dL   GFR, Estimated >41 >32 mL/min    Comment: (NOTE) Calculated using the CKD-EPI Creatinine Equation (2021)    Anion gap 10 5 - 15    Comment: Performed at [facility], [address] [place], [place] [address]  CBC with Differential     Status: Abnormal   Collection Time: 01/08/21  9:35 AM  Result Value Ref Range   WBC 7.8 4.0 - 10.5 K/uL   RBC 4.25 4.22 - 5.81 MIL/uL   Hemoglobin 8.2 (L) 13.0 - 17.0 g/dL    Comment: Reticulocyte Hemoglobin testing may be clinically indicated, consider ordering this additional test [id number]    HCT 27.7 (L) 39.0 - 52.0 %   MCV 65.2 (L)  80.0 - 100.0 fL   MCH 19.3 (L) 26.0 - 34.0 pg   MCHC 29.6 (L) 30.0 - 36.0 g/dL   RDW 40.919.1 (H) 81.111.5 - 91.415.5 %   Platelets 387 150 - 400 K/uL    Comment: REPEATED TO VERIFY   nRBC 0.0 0.0 - 0.2 %   Neutrophils Relative % 81 %   Neutro Abs 6.3 1.7 - 7.7 K/uL   Lymphocytes Relative 6 %   Lymphs Abs 0.5 (L) 0.7 - 4.0 K/uL   Monocytes Relative 11 %   Monocytes Absolute 0.9 0.1 - 1.0 K/uL   Eosinophils Relative 1 %   Eosinophils Absolute 0.1 0.0 - 0.5 K/uL   Basophils Relative 1 %   Basophils Absolute 0.1 0.0 - 0.1 K/uL   Immature Granulocytes 0 %   Abs Immature Granulocytes 0.03 0.00 - 0.07 K/uL   Polychromasia PRESENT     Comment: Performed at [facility], [address] [place], [place] [address]  Lactic acid, plasma     Status: Abnormal   Collection Time: 01/08/21 12:44 PM  Result Value Ref Range   Lactic Acid, Venous 3.0 (HH) 0.5 - 1.9 mmol/L    Comment: CRITICAL RESULT CALLED TO, READ BACK BY AND  VERIFIED WITH: R HARDY RN [id number] BY A BENNETT Performed at [facility], [address] [place], [place] [address]   Lactic acid, plasma     Status: None   Collection Time: 01/08/21  3:15 PM  Result Value Ref Range   Lactic Acid, Venous 1.6 0.5 - 1.9 mmol/L    Comment: Performed at [facility], [address] [place], [place] [address]  Hemoglobin A1c     Status: Abnormal   Collection Time: 01/08/21  3:15 PM  Result Value Ref Range   Hgb A1c MFr Bld 6.2 (H) 4.8 - 5.6 %    Comment: (NOTE) Pre diabetes:          5.7%-6.4%  Diabetes:              >6.4%  Glycemic control for   <7.0% adults with diabetes    Mean Plasma Glucose 131.24 mg/dL    Comment: Performed at [facility], [address] [place], [place] [address]  Sedimentation rate     Status: Abnormal   Collection Time: 01/08/21  3:15 PM  Result Value Ref Range   Sed Rate 60 (H) 0 - 16 mm/hr    Comment: Performed at [facility], [address] [place], [place] [address]  C-reactive protein     Status: Abnormal   Collection Time: 01/08/21  3:15 PM  Result Value Ref Range   CRP 7.6 (H) <1.0 mg/dL    Comment: Performed at [facility], [address] [place], [place] [address]  Prealbumin     Status: Abnormal   Collection Time: 01/08/21  3:15 PM  Result Value Ref Range   Prealbumin 15.9 (L) 18 - 38 mg/dL    Comment: Performed at [facility], [address] [place], [place] [address]  Iron and TIBC     Status: Abnormal   Collection Time: 01/08/21  3:15 PM  Result Value Ref Range   Iron 14 (L) 45 - 182 ug/dL   TIBC 742454 (H) 595250 - 638450 ug/dL   Saturation Ratios 3 (L) 17.9 - 39.5 %   UIBC 440 ug/dL    Comment: Performed at [facility], [address] [place], [place] [address]  Ferritin     Status: Abnormal   Collection Time: 01/08/21  3:15 PM  Result Value Ref Range   Ferritin 13 (L) 24 - 336 ng/mL    Comment: Performed at [facility], [address],  [place], [place] [address]  HIV Antibody (routine testing w rflx)     Status: None   Collection Time: 01/08/21  3:15 PM  Result Value Ref Range   HIV Screen 4th Generation wRfx Non Reactive Non Reactive    Comment: Performed at [facility], [address] [place], [place] [address]  CBG monitoring, ED     Status: Abnormal   Collection Time: 01/08/21  4:39 PM  Result Value Ref Range   Glucose-Capillary 121 (H) 70 - 99 mg/dL    Comment: Glucose reference range applies only to samples taken after fasting for at least 8 hours.   Comment 1 Notify RN    Comment 2 Document in Chart     DG Ankle Complete Left  Result Date: 01/08/2021 CLINICAL DATA:  LEFT foot pain and swelling and LEFT ankle pain for 1 month, open wound at LEFT heel, struck bed frame 1 month ago, history diabetes mellitus, smoking EXAM: LEFT ANKLE COMPLETE - 3+ VIEW COMPARISON:  None FINDINGS: Osseous mineralization normal. Minimal soft tissue swelling, especially laterally. Joint spaces preserved. Corticated old ossicles adjacent to the tips of the lateral and medial malleoli. No acute fracture, dislocation, or bone destruction. Tiny plantar calcaneal spur. Soft tissue lucency overlying the calcaneus dorsally consistent with ulcer, though no underlying bone destruction is identified. IMPRESSION: Posterior heel ulcer without underlying bone destruction; if there is clinical concern for osteomyelitis, consider MR. No acute osseous abnormalities. Electronically Signed   By: Ulyses Southward M.D.   On: 01/08/2021 10:25   DG Foot Complete Left  Result Date: 01/08/2021 CLINICAL DATA:  Prior injury. Diabetes. Open wound. Left foot swelling and pain. EXAM: LEFT FOOT - COMPLETE 3+ VIEW COMPARISON:  12/21/2020 11/26/2020. FINDINGS: Soft tissue ulceration noted the heel. No radiopaque foreign body. No bony erosion. No acute or focal bony abnormality. Old medial malleolar fracture fragment noted. IMPRESSION: Soft tissue ulceration over the heel noted.  No radiopaque foreign body. No acute bony abnormality identified. Electronically Signed   By: Maisie Fus  Register   On: 01/08/2021 10:22    Review of Systems Blood pressure 121/70, pulse (!) 102, temperature 98.9 F (37.2 C), resp. rate 16, SpO2 100 %. Physical Exam General: AAO x3, NAD  Dermatological: Eschar is noted to the posterior aspect of the left heel as well as the posterior Achilles tendon, calf area.  Also wound present left fourth toe with tenderness palpation.  No purulence.  Along the heel wounds amount of purulent is identified there is tenderness palpation there is no fluctuation crepitation.  There is edema to the foot and slight warmth.  No ulceration crepitation.  There is darkened discoloration present to the left foot and ankle.  Vascular: Difficulty to palpate pulses.  Neruologic: Grossly intact via light touch bilateral.   Musculoskeletal: Tenderness to the heel ulcer.  Gait: Unassisted, Nonantalgic.   Assessment/Plan: X-rays were reviewed which not reveal any significant osteomyelitis however given the infection recommend an MRI which is ordered.  Also awaiting vascular studies but likely will need vascular surgery consultation for concerns of PAD given the discoloration to his foot as well as difficulty to palpate pulses.  Continue IV antibiotics.  Will likely need surgical  debridement pending circulation studies.  Podiatry will continue to follow.  Ovid Curd, DPM  Vivi Barrack 01/08/2021, 8:18 PM

## 2021-01-08 NOTE — H&P (Signed)
History and Physical    Tony Hicks BSJ:[id number] DOB: 06/02/1966 DOA: 01/08/2021  Referring MD/NP/PA: Nanda Quinton, MD PCP: [name], NP  Patient coming from: home   Chief Complaint: Left foot pain  I have personally briefly reviewed patient's old medical records in [place]   HPI: Tony Hicks is a 55 y.o. male with medical history significant of diabetes mellitus type II and polysubstance abuse(tobacco, alcohol, and cocaine) presents with complaints of left foot pain.  Patient reports that he has had ulcers present of his left foot for over 2 months now.  Previously seen by Dr. Paulla Dolly of podiatry back in March of this year.  He subsequently was seen in the emergency department on 5/23 and at that time had been prescribed Bactrim zinc ointment placed on his foot twice daily.  Patient reports that he has been placing ointment on the foot as advised, but noted no improvement.  He complains of having sharp pains in his foot that are severe and keeps him up at night.  He reports the ulcers have enlarged in size and been draining pus and blood.  Associated symptoms include itching of his foot, swelling, and increased warmth to his foot.  He had tried to take ibuprofen previously, but reported that it did not help with his pain.  Denies having any fevers, chest pain, nausea, vomiting, diarrhea, or blood in stools.  He admits to smoking cigarettes, drinking reportedly from 2-8 beers per day on most days, and snorting cocaine last yesterday.  He also reports that he had recently ran out of his gabapentin, but reports some issues with obtaining his medications due to money.  Patient has never had a colonoscopy before previously in the past.  ED Course: Upon admission into the emergency department patient was seen to be febrile up to 102.9 F, pulse 100-114, blood pressures 112/79-185/61, and O2 saturation maintained on room air.  Labs significant for hemoglobin 8.2, sodium 131, glucose 175, and  lactic acid 3.  X-rays of the left foot noted soft tissue ulceration over the heel with no bony involvement..  Patient has been started on Rocephin IV.  TRH called to admit.  Review of Systems  Constitutional:  Negative for fever.  HENT:  Negative for congestion and nosebleeds.   Eyes:  Negative for pain.  Respiratory:  Negative for cough and shortness of breath.   Cardiovascular:  Negative for chest pain and leg swelling.  Gastrointestinal:  Negative for blood in stool, diarrhea, nausea and vomiting.  Genitourinary:  Negative for dysuria and flank pain.  Musculoskeletal:  Negative for joint pain and myalgias.  Skin:  Positive for itching.  Neurological:  Negative for focal weakness and loss of consciousness.  Psychiatric/Behavioral:  Positive for substance abuse. The patient has insomnia.    Past Medical History:  Diagnosis Date   Diabetes mellitus without complication ([place])    Syphilis    Treated  02/14/2019    Past Surgical History:  Procedure Laterality Date   NO PAST SURGERIES       reports that he has been smoking cigarettes. He has been smoking an average of 0.50 packs per day. He has never used smokeless tobacco. He reports current alcohol use of about 24.0 standard drinks of alcohol per week. He reports current drug use. Frequency: 1.00 time per week. Drug: Cocaine.  No Known Allergies  Family History  Family history unknown: Yes    Prior to Admission medications   Medication Sig Start Date End  Date Taking? Authorizing Provider  bacitracin ointment Apply 1 application topically 2 (two) times daily. 12/26/20  Yes Minette Brine, Amy J, NP  ibuprofen (ADVIL) 600 MG tablet Take 1 tablet (600 mg total) by mouth every 8 (eight) hours as needed. 12/26/20  Yes Minette Brine, Amy J, NP  metFORMIN (GLUCOPHAGE) 500 MG tablet Take 1 tablet (500 mg total) by mouth daily with breakfast. 02/18/19  Yes Newlin, Enobong, MD  gabapentin (NEURONTIN) 300 MG capsule Take 1 capsule (300 mg total) by  mouth 2 (two) times daily. Patient not taking: Reported on 01/08/2021 02/17/19   Charlott Rakes, MD  terbinafine (LAMISIL) 250 MG tablet Take 1 tablet (250 mg total) by mouth daily. 02/03/19   Robyn Haber, MD    Physical Exam:  Constitutional: Middle-age male currently in no acute distress Vitals:   01/08/21 0912 01/08/21 1211 01/08/21 1313  BP: 112/79 (!) 185/61 (!) 127/95  Pulse: (!) 109 (!) 114 100  Resp: _0 Temp: 99.3 F (37.4 C)    TempSrc: Oral    SpO2: 93% 90% 100%   Eyes: PERRL, lids and conjunctivae normal ENMT: Mucous membranes are moist. Posterior pharynx clear of any exudate or lesions.  Neck: normal, supple, no masses, no thyromegaly Respiratory: clear to auscultation bilaterally, no wheezing, no crackles. Normal respiratory effort. No accessory muscle use.  Cardiovascular: Regular rate and rhythm, no murmurs / rubs / gallops. No extremity edema. 1+ pedal pulses. No carotid bruits.  Abdomen: no tenderness, no masses palpated. No hepatosplenomegaly. Bowel sounds positive.  Musculoskeletal: no clubbing / cyanosis. No joint deformity upper and lower extremities. Good ROM, no contractures. Normal muscle tone.  Skin: Patient is noted to have ulceration of the left fourth toe and heel with foul-smelling drainage present as seen below    Neurologic: CN 2-12 grossly intact.  Abnormal sensation strength 5/5 in all 4.  Psychiatric: Normal judgment and insight. Alert and oriented x 3. Normal mood.     Labs on Admission: I have personally reviewed following labs and imaging studies  CBC: Recent Labs  Lab 01/08/21 0935  WBC 7.8  NEUTROABS 6.3  HGB 8.2*  HCT 27.7*  MCV 65.2*  PLT 800   Basic Metabolic Panel: Recent Labs  Lab 01/08/21 0935  NA 131*  K 3.6  CL 97*  CO2 24  GLUCOSE 175*  BUN 5*  CREATININE 0.85  CALCIUM 9.2   GFR: CrCl cannot be calculated (Unknown ideal weight.). Liver Function Tests: Recent Labs  Lab 01/08/21 0935  AST 21  ALT  15  ALKPHOS 54  BILITOT 0.8  PROT 7.3  ALBUMIN 3.2*   No results for input(s): LIPASE, AMYLASE in the last 168 hours. No results for input(s): AMMONIA in the last 168 hours. Coagulation Profile: No results for input(s): INR, PROTIME in the last 168 hours. Cardiac Enzymes: No results for input(s): CKTOTAL, CKMB, CKMBINDEX, TROPONINI in the last 168 hours. BNP (last 3 results) No results for input(s): PROBNP in the last 8760 hours. HbA1C: No results for input(s): HGBA1C in the last 72 hours. CBG: Recent Labs  Lab 01/08/21 0926  GLUCAP 208*   Lipid Profile: No results for input(s): CHOL, HDL, LDLCALC, TRIG, CHOLHDL, LDLDIRECT in the last 72 hours. Thyroid Function Tests: No results for input(s): TSH, T4TOTAL, FREET4, T3FREE, THYROIDAB in the last 72 hours. Anemia Panel: No results for input(s): VITAMINB12, FOLATE, FERRITIN, TIBC, IRON, RETICCTPCT in the last 72 hours. Urine analysis: No results found for: COLORURINE, APPEARANCEUR, [place], [place], [place], [place], [place],  KETONESUR, PROTEINUR, UROBILINOGEN, NITRITE, LEUKOCYTESUR Sepsis Labs: No results found for this or any previous visit (from the past 240 hour(s)).   Radiological Exams on Admission: DG Ankle Complete Left  Result Date: 01/08/2021 CLINICAL DATA:  LEFT foot pain and swelling and LEFT ankle pain for 1 month, open wound at LEFT heel, struck bed frame 1 month ago, history diabetes mellitus, smoking EXAM: LEFT ANKLE COMPLETE - 3+ VIEW COMPARISON:  None FINDINGS: Osseous mineralization normal. Minimal soft tissue swelling, especially laterally. Joint spaces preserved. Corticated old ossicles adjacent to the tips of the lateral and medial malleoli. No acute fracture, dislocation, or bone destruction. Tiny plantar calcaneal spur. Soft tissue lucency overlying the calcaneus dorsally consistent with ulcer, though no underlying bone destruction is identified. IMPRESSION: Posterior heel ulcer without underlying bone  destruction; if there is clinical concern for osteomyelitis, consider MR. No acute osseous abnormalities. Electronically Signed   By: Lavonia Dana M.D.   On: 01/08/2021 10:25   DG Foot Complete Left  Result Date: 01/08/2021 CLINICAL DATA:  Prior injury. Diabetes. Open wound. Left foot swelling and pain. EXAM: LEFT FOOT - COMPLETE 3+ VIEW COMPARISON:  12/21/2020 11/26/2020. FINDINGS: Soft tissue ulceration noted the heel. No radiopaque foreign body. No bony erosion. No acute or focal bony abnormality. Old medial malleolar fracture fragment noted. IMPRESSION: Soft tissue ulceration over the heel noted. No radiopaque foreign body. No acute bony abnormality identified. Electronically Signed   By: Marcello Moores  Register   On: 01/08/2021 10:22    X-rays of the left foot: Independently reviewed.  Soft tissue ulceration noted over the heel but no acute bony destruction appreciated  Assessment/Plan Sepsis secondary to diabetic foot ulcer with cellulitis: Acute.  Patient presents with ulcers of the leg left foot with increased swelling and pain.  Found to be febrile up to 102.9 F with tachycardia and initial lactic acid of 3.  Blood cultures have been obtained and patient has been started on empiric antibiotics of Rocephin. -Admit to a medical telemetry bed -Check urine culture -Lower extremity wound order set utilized -Check hemoglobin A1c, prealbumin,  CRP, ESR  -Check ABI -Check MRI of the foot -Continue empiric antibiotics of Rocephin IV -Hydrocodone as needed for pain -Trend lactic acid levels -Tylenol as need for fever -Appreciate podiatry consultative services, will follow-up for further recommendation  Diabetes mellitus type 2 with neuropathy: On admission patient noted to have glucose 175.  Home medication regimen includes metformin. -Hypoglycemic protocols -Hold metformin -Continue gabapentin -CBGs before every meal with sensitive SSI  Microcytic hypochromic anemia: On admission hemoglobin 8.2  g/dL with low MCV and MCH, but hemoglobin appears stable when compared to the last month.  He reports that he has never had a colonoscopy previously in the past. -Check iron studies and will order iron infusion in a.m. if warrented -Continue to monitor H&H -Patient needs referral to gastroenterology for colonoscopy.  Hyponatremia: Acute.  Sodium 131 on admission. -check urine sodium -Continue monitor  Polysubstance abuse: Patient admits to drinking significant amount of alcohol most days, smoking tobacco, and snorting cocaine recently. -Continue counseling on need of cessation substance use -CIWA protocols initiated -Nicotine patch offered -Thiamine, multivitamin, and folate  PAD: Patient with decreased pulses noted in his lower extremities. -Follow-up ABI -Consider formally consulting vascular surgery depending on ABIs     DVT prophylaxis: Lovenox Code Status: Full Family Communication: patient declined Disposition Plan: Home Consults called: Podiatry Admission status: Inpatient, require more than 2 midnight stay  Norval Morton MD [facility]  If 7PM-7AM, please contact night-coverage   01/08/2021, 2:06 PM

## 2021-01-08 NOTE — Progress Notes (Signed)
Spoke to Dr. Jacqulyn Bath at 12:38pm today regarding consult. Informed him that I will be by later today to see patient.

## 2021-01-08 NOTE — ED Provider Notes (Signed)
Emergency Medicine Provider Triage Evaluation Note  Tony Hicks 56 y.o. male was evaluated in triage.  Pt complains of presents for evaluation of left foot and ankle pain and swelling has been ongoing for 2 months.  He feels like it is been progressive worsening.  He has a history of diabetes and states he is compliant with his medications.  He states initially he hit it against a bed frame and caused a wound and since then, it is spread around his heel and up into his ankle.  He has not noted any fevers.   Review of Systems  Positive: Foot and ankle pain Negative: Fevers  Physical Exam  BP 134/82   Pulse 70   Temp 98.2 F (36.8 C) (Oral)   Resp 18   Ht 5\' 4"  (1.626 m)   Wt 65.8 kg   SpO2 100%   BMI 24.89 kg/m  Gen:   Awake, no distress   HEENT:  Atraumatic  Resp:  Normal effort Cardiac:  Normal rate.  2+ DP pulses bilaterally. MSK:   Moves extremities without difficulty.  Wound noted to the left heel as well as left fourth digit.  There is some purulent drainage.  There are some mild soft tissue swelling around it and into the ankle.  No overlying warmth. Neuro:  Speech clear   Other:     Medical Decision Making  Medically screening exam initiated at 9:31 AM  Appropriate orders placed.  Tony Hicks was informed that the remainder of the evaluation will be completed by another provider, this initial triage assessment does not replace that evaluation. They are counseled that they will need to remain in the ED until the completion of their workup, including full H&P and results of any tests.  Risks of leaving the emergency department prior to completion of treatment were discussed. Patient was advised to inform ED staff if they are leaving before their treatment is complete. The patient acknowledged these risks and time was allowed for questions.     The patient appears stable so that the remainder of the MSE may be completed by another provider.    Clinical Impression  Foot  pain   Portions of this note were generated with Dragon dictation software. Dictation errors may occur despite best attempts at proofreading.     Laurita Quint, PA-C 01/08/21 0932    03/11/21, MD 01/09/21 [phone]

## 2021-01-08 NOTE — ED Triage Notes (Signed)
Pt with non-healing wound to L foot. Seen for same. Hx of type 1 DM. Reports his lawyer told him to come here to see when he can go back to work. Also reports he is out of pain medication.

## 2021-01-08 NOTE — Progress Notes (Signed)
Patient arrived to floor, no distress noted. Patient oriented to floor, denies any needs at this time. Call light in reach and bed in lowest position.

## 2021-01-08 NOTE — ED Provider Notes (Signed)
Emergency Department Provider Note   I have reviewed the triage vital signs and the nursing notes.   HISTORY  Chief Complaint Wound Check   HPI Tony Hicks is a 55 y.o. male with PMH of DM presents to the emergency department with worsening pain in the left foot.  Patient states that he has had pain with some ulceration there for weeks to several months.  He states has been seen in the emergency department for this and followed with [facility] several months ago.  He has had worsening pain over the past 2 weeks but became significantly worse last night.  He states he was unable to sleep due to pain.  He denies fevers but states there has been drainage from the wound.  He has used the boot provided during prior ED evaluations but has had increasing discomfort.  He presents today for evaluation.    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  Family History  Family history unknown: Yes    Social History Social History   Tobacco Use   Smoking status: Every Day    Packs/day: 0.50    Pack years: 0.00    Types: Cigarettes   Smokeless tobacco: Never  Vaping Use   Vaping Use: Never used  Substance Use Topics   Alcohol use: Yes    Alcohol/week: 24.0 standard drinks    Types: 4 Cans of beer, 20 Standard drinks or equivalent per week    Comment: Approx 20-25 beers week   Drug use:  Yes    Frequency: 1.0 times per week    Types: Cocaine    Comment: smoke crack    Review of Systems  Constitutional: No fever/chills Eyes: No visual changes. ENT: No sore throat. Cardiovascular: Denies chest pain. Respiratory: Denies shortness of breath. Gastrointestinal: No abdominal pain.  No nausea, no vomiting.  No diarrhea.  No constipation. Genitourinary: Negative for dysuria. Musculoskeletal: Negative for back pain. Positive left foot/ankle pain.  Skin: Ulcers to the left foot.  Neurological: Negative for headaches, focal weakness or numbness.  10-point ROS otherwise negative.  ____________________________________________   PHYSICAL EXAM:  VITAL SIGNS: ED Triage Vitals  Enc Vitals Group     BP 01/08/21 0912 112/79     Pulse Rate 01/08/21 0912 (!) 109     Resp 01/08/21 0912 18     Temp 01/08/21 0912 99.3 F (37.4 C)     Temp Source 01/08/21 0912 Oral     SpO2 01/08/21 0912 93 %   Constitutional: Alert and oriented. Well appearing and in no acute distress. Eyes: Conjunctivae are normal.  Head: Atraumatic. Nose: No congestion/rhinnorhea. Mouth/Throat: Mucous membranes are moist.  Neck: No stridor.  Cardiovascular: Normal rate, regular rhythm. Able to doppler DP/PT pulses on the left. Grossly normal heart sounds.   Respiratory: Normal respiratory effort.  No retractions. Lungs CTAB. Gastrointestinal: Soft  and nontender. No distention.  Musculoskeletal: Darker color to left foot and ankle swelling. Ulcers as pictured.  Neurologic:  Normal speech and language. No gross focal neurologic deficits are appreciated.  Skin:  Skin is warm. Ulceration to the left heel and some skin breakdown to the toes on the left.        ____________________________________________   LABS (all labs ordered are listed, but only abnormal results are displayed)  Labs Reviewed  SARS CORONAVIRUS 2 (TAT 6-24 HRS) - Abnormal; Notable for the following components:      Result Value    SARS Coronavirus 2 POSITIVE (*)    All other components within normal limits  COMPREHENSIVE METABOLIC PANEL - Abnormal; Notable for the following components:   Sodium 131 (*)    Chloride 97 (*)    Glucose, Bld 175 (*)    BUN 5 (*)    Albumin 3.2 (*)    All other components within normal limits  CBC WITH DIFFERENTIAL/PLATELET - Abnormal; Notable for the following components:   Hemoglobin 8.2 (*)    HCT 27.7 (*)    MCV 65.2 (*)    MCH 19.3 (*)    MCHC 29.6 (*)    RDW 19.1 (*)    Lymphs Abs 0.5 (*)    All other components within normal limits  LACTIC ACID, PLASMA - Abnormal; Notable for the following components:   Lactic Acid, Venous 3.0 (*)    All other components within normal limits  HEMOGLOBIN A1C - Abnormal; Notable for the following components:   Hgb A1c MFr Bld 6.2 (*)    All other components within normal limits  SEDIMENTATION RATE - Abnormal; Notable for the following components:   Sed Rate 60 (*)    All other components within normal limits  C-REACTIVE PROTEIN - Abnormal; Notable for the following components:   CRP 7.6 (*)    All other components within normal limits  PREALBUMIN - Abnormal; Notable for the following components:   Prealbumin 15.9 (*)    All other components within normal limits  IRON AND TIBC - Abnormal; Notable for the following components:   Iron 14 (*)    TIBC 454 (*)    Saturation Ratios 3 (*)    All other components within normal limits  FERRITIN - Abnormal; Notable for the following components:   Ferritin 13 (*)    All other components within normal limits  PHOSPHORUS - Abnormal; Notable for the following components:   Phosphorus 4.9 (*)    All other components within normal limits  GLUCOSE, CAPILLARY - Abnormal; Notable for the following components:   Glucose-Capillary 104 (*)    All other components within normal limits  GLUCOSE, CAPILLARY - Abnormal; Notable for the following components:   Glucose-Capillary 108 (*)    All other  components within normal limits  COMPREHENSIVE METABOLIC PANEL - Abnormal; Notable for the following components:   Sodium 133 (*)    BUN <5 (*)    Calcium 8.8 (*)    Albumin 2.9 (*)    All other components within normal limits  CBC - Abnormal; Notable for the following components:   RBC 4.18 (*)    Hemoglobin 8.1 (*)    HCT 26.6 (*)    MCV 63.6 (*)    MCH 19.4 (*)    RDW 19.2 (*)    All other components within normal limits  C-REACTIVE PROTEIN - Abnormal; Notable for the following components:   CRP 3.9 (*)  All other components within normal limits  D-DIMER, QUANTITATIVE - Abnormal; Notable for the following components:   D-Dimer, Quant 2.70 (*)    All other components within normal limits  GLUCOSE, CAPILLARY - Abnormal; Notable for the following components:   Glucose-Capillary 105 (*)    All other components within normal limits  GLUCOSE, CAPILLARY - Abnormal; Notable for the following components:   Glucose-Capillary 108 (*)    All other components within normal limits  GLUCOSE, CAPILLARY - Abnormal; Notable for the following components:   Glucose-Capillary 165 (*)    All other components within normal limits  CBC - Abnormal; Notable for the following components:   RBC 4.03 (*)    Hemoglobin 7.9 (*)    HCT 25.7 (*)    MCV 63.8 (*)    MCH 19.6 (*)    RDW 19.3 (*)    All other components within normal limits  GLUCOSE, CAPILLARY - Abnormal; Notable for the following components:   Glucose-Capillary 102 (*)    All other components within normal limits  CBC - Abnormal; Notable for the following components:   Hemoglobin 8.1 (*)    HCT 27.2 (*)    MCV 64.3 (*)    MCH 19.1 (*)    MCHC 29.8 (*)    RDW 19.9 (*)    All other components within normal limits  VANCOMYCIN, TROUGH - Abnormal; Notable for the following components:   Vancomycin Tr 5 (*)    All other components within normal limits  VANCOMYCIN, PEAK - Abnormal; Notable for the following components:   Vancomycin Pk  26 (*)    All other components within normal limits  GLUCOSE, CAPILLARY - Abnormal; Notable for the following components:   Glucose-Capillary 122 (*)    All other components within normal limits  GLUCOSE, CAPILLARY - Abnormal; Notable for the following components:   Glucose-Capillary 143 (*)    All other components within normal limits  GLUCOSE, CAPILLARY - Abnormal; Notable for the following components:   Glucose-Capillary 132 (*)    All other components within normal limits  GLUCOSE, CAPILLARY - Abnormal; Notable for the following components:   Glucose-Capillary 125 (*)    All other components within normal limits  GLUCOSE, CAPILLARY - Abnormal; Notable for the following components:   Glucose-Capillary 147 (*)    All other components within normal limits  GLUCOSE, CAPILLARY - Abnormal; Notable for the following components:   Glucose-Capillary 121 (*)    All other components within normal limits  GLUCOSE, CAPILLARY - Abnormal; Notable for the following components:   Glucose-Capillary 143 (*)    All other components within normal limits  CBG MONITORING, ED - Abnormal; Notable for the following components:   Glucose-Capillary 208 (*)    All other components within normal limits  CBG MONITORING, ED - Abnormal; Notable for the following components:   Glucose-Capillary 121 (*)    All other components within normal limits  CULTURE, BLOOD (ROUTINE X 2)  CULTURE, BLOOD (ROUTINE X 2)  SURGICAL PCR SCREEN  LACTIC ACID, PLASMA  HIV ANTIBODY (ROUTINE TESTING W REFLEX)  MAGNESIUM  GLUCOSE, CAPILLARY  GLUCOSE, CAPILLARY  GLUCOSE, CAPILLARY  GLUCOSE, CAPILLARY  GLUCOSE, CAPILLARY  GLUCOSE, CAPILLARY  CREATININE, SERUM  GLUCOSE, CAPILLARY  GLUCOSE, CAPILLARY  GLUCOSE, CAPILLARY  SODIUM, URINE, RANDOM  URINALYSIS, ROUTINE W REFLEX MICROSCOPIC  BASIC METABOLIC PANEL  CBC   ____________________________________________  RADIOLOGY  DG Ankle Complete Left  Result Date:  01/08/2021 CLINICAL DATA:  LEFT foot pain and swelling  and LEFT ankle pain for 1 month, open wound at LEFT heel, struck bed frame 1 month ago, history diabetes mellitus, smoking EXAM: LEFT ANKLE COMPLETE - 3+ VIEW COMPARISON:  None FINDINGS: Osseous mineralization normal. Minimal soft tissue swelling, especially laterally. Joint spaces preserved. Corticated old ossicles adjacent to the tips of the lateral and medial malleoli. No acute fracture, dislocation, or bone destruction. Tiny plantar calcaneal spur. Soft tissue lucency overlying the calcaneus dorsally consistent with ulcer, though no underlying bone destruction is identified. IMPRESSION: Posterior heel ulcer without underlying bone destruction; if there is clinical concern for osteomyelitis, consider MR. No acute osseous abnormalities. Electronically Signed   By: Ulyses Southward M.D.   On: 01/08/2021 10:25   DG Foot Complete Left  Result Date: 01/08/2021 CLINICAL DATA:  Prior injury. Diabetes. Open wound. Left foot swelling and pain. EXAM: LEFT FOOT - COMPLETE 3+ VIEW COMPARISON:  12/21/2020 11/26/2020. FINDINGS: Soft tissue ulceration noted the heel. No radiopaque foreign body. No bony erosion. No acute or focal bony abnormality. Old medial malleolar fracture fragment noted. IMPRESSION: Soft tissue ulceration over the heel noted. No radiopaque foreign body. No acute bony abnormality identified. Electronically Signed   By: Maisie Fus  Register   On: 01/08/2021 10:22    ____________________________________________   PROCEDURES  Procedure(s) performed:   Procedures  None ____________________________________________   INITIAL IMPRESSION / ASSESSMENT AND PLAN / ED COURSE  Pertinent labs & imaging results that were available during my care of the patient were reviewed by me and considered in my medical decision making (see chart for details).   Patient presents to the emergency department with worsening pain in the left foot with ulcerations as  pictured.  I have some suspicion for ischemic pattern of ulceration and skin breakdown.  There are dopplerable pulses on the left both DP and PT. the left foot is swollen in comparison to the right.  There may be some developing cellulitis on exam.  Plan for antibiotics.  Imaging does not show any obvious osteomyelitis and wounds do appear more superficial.  Patient tells me he is followed with [facility] before.  I spoke with Dr. Ardelle Anton with Podiatry who will consult as an inpatient.   Labs and imaging reviewed.   Discussed patient's case with [facility] to request admission. Patient and family (if present) updated with plan. Care transferred to [facility] service.  I reviewed all nursing notes, vitals, pertinent old records, EKGs, labs, imaging (as available).  ____________________________________________  FINAL CLINICAL IMPRESSION(S) / ED DIAGNOSES  Final diagnoses:  Cellulitis of left foot     MEDICATIONS GIVEN DURING THIS VISIT:  Medications  lactated ringers infusion ( Intravenous New Bag/Given 01/09/21 0121)  cefTRIAXone (ROCEPHIN) 2 g in sodium chloride 0.9 % 100 mL IVPB (2 g Intravenous New Bag/Given 01/13/21 1501)  insulin aspart (novoLOG) injection 0-9 Units (1 Units Subcutaneous Given 01/13/21 1702)  enoxaparin (LOVENOX) injection 40 mg (40 mg Subcutaneous Given 01/13/21 1458)  acetaminophen (TYLENOL) tablet 650 mg ( Oral MAR Unhold 01/11/21 1752)  HYDROcodone-acetaminophen (NORCO/VICODIN) 5-325 MG per tablet 1 tablet (1 tablet Oral Given 01/13/21 1224)  gabapentin (NEURONTIN) capsule 300 mg (300 mg Oral Given 01/13/21 0928)  LORazepam (ATIVAN) tablet 1-4 mg ( Oral MAR Unhold 01/11/21 1752)    Or  LORazepam (ATIVAN) injection 1-4 mg ( Intravenous MAR Unhold 01/11/21 1752)  thiamine tablet 100 mg (100 mg Oral Given 01/13/21 0928)    Or  thiamine (B-1) injection 100 mg ( Intravenous See Alternative 01/13/21 0928)  folic  acid (FOLVITE) tablet 1 mg (1 mg Oral Given 01/13/21 0928)  multivitamin  with minerals tablet 1 tablet (1 tablet Oral Given 01/13/21 0928)  LORazepam (ATIVAN) injection 0-4 mg (0 mg Intravenous Not Given 01/10/21 1214)    Followed by  LORazepam (ATIVAN) injection 0-4 mg (0 mg Intravenous Not Given 01/12/21 0549)  nicotine (NICODERM CQ - dosed in mg/24 hours) patch 14 mg (14 mg Transdermal Patch Applied 01/13/21 0928)  nutrition supplement (JUVEN) (JUVEN) powder packet 1 packet (1 packet Oral Given 01/13/21 1458)  protein supplement (ENSURE MAX) liquid (11 oz Oral Given 01/12/21 2008)  hydrocortisone cream 1 % ( Topical MAR Unhold 01/11/21 1752)  aspirin EC tablet 81 mg (81 mg Oral Given 01/13/21 0928)  atorvastatin (LIPITOR) tablet 40 mg (40 mg Oral Given 01/13/21 0928)  labetalol (NORMODYNE) injection 10 mg (has no administration in time range)  hydrALAZINE (APRESOLINE) injection 5 mg (has no administration in time range)  ondansetron (ZOFRAN) injection 4 mg (has no administration in time range)  0.9% sodium chloride infusion (1 mL/kg/hr  83.9 kg Intravenous Continued from Pre-op 01/11/21 2058)  sodium chloride flush (NS) 0.9 % injection 3 mL (3 mLs Intravenous Given 01/13/21 0929)  sodium chloride flush (NS) 0.9 % injection 3 mL (has no administration in time range)  0.9 %  sodium chloride infusion (has no administration in time range)  vancomycin (VANCOREADY) IVPB 1500 mg/300 mL (1,500 mg Intravenous New Bag/Given 01/13/21 1651)  morphine 4 MG/ML injection 4 mg (4 mg Intravenous Given 01/08/21 1419)  lactated ringers bolus 1,000 mL (1,000 mLs Intravenous New Bag/Given 01/08/21 1625)  ferumoxytol (FERAHEME) 510 mg in sodium chloride 0.9 % 100 mL IVPB (510 mg Intravenous New Bag/Given 01/09/21 1217)  vancomycin (VANCOREADY) IVPB 1500 mg/300 mL (1,500 mg Intravenous New Bag/Given 01/09/21 1435)  morphine 2 MG/ML injection 1 mg (1 mg Intravenous Given 01/11/21 0208)  morphine 2 MG/ML injection 1 mg (1 mg Intravenous Given 01/11/21 1139)      Note:  This document was prepared using Dragon  voice recognition software and may include unintentional dictation errors.  Alona Bene, MD, [facility]    Ikram Riebe, Arlyss Repress, MD 01/13/21 [phone]

## 2021-01-09 ENCOUNTER — Inpatient Hospital Stay (HOSPITAL_COMMUNITY): Payer: Self-pay

## 2021-01-09 DIAGNOSIS — Z01810 Encounter for preprocedural cardiovascular examination: Secondary | ICD-10-CM

## 2021-01-09 DIAGNOSIS — L03116 Cellulitis of left lower limb: Secondary | ICD-10-CM

## 2021-01-09 DIAGNOSIS — I739 Peripheral vascular disease, unspecified: Secondary | ICD-10-CM

## 2021-01-09 DIAGNOSIS — I70229 Atherosclerosis of native arteries of extremities with rest pain, unspecified extremity: Secondary | ICD-10-CM

## 2021-01-09 DIAGNOSIS — L97509 Non-pressure chronic ulcer of other part of unspecified foot with unspecified severity: Secondary | ICD-10-CM

## 2021-01-09 LAB — GLUCOSE, CAPILLARY
Glucose-Capillary: 104 mg/dL — ABNORMAL HIGH (ref 70–99)
Glucose-Capillary: 108 mg/dL — ABNORMAL HIGH (ref 70–99)
Glucose-Capillary: 85 mg/dL (ref 70–99)
Glucose-Capillary: 95 mg/dL (ref 70–99)
Glucose-Capillary: 105 mg/dL — ABNORMAL HIGH (ref 70–99)

## 2021-01-09 LAB — MAGNESIUM: Magnesium: 1.9 mg/dL (ref 1.7–2.4)

## 2021-01-09 LAB — PHOSPHORUS: Phosphorus: 4.9 mg/dL — ABNORMAL HIGH (ref 2.5–4.6)

## 2021-01-09 MED ORDER — HYDROCORTISONE 1 % EX CREA
TOPICAL_CREAM | CUTANEOUS | Status: DC | PRN
  Administered 2021-01-10: 1 via TOPICAL
  Filled 2021-01-09: qty 28

## 2021-01-09 MED ORDER — JUVEN PO PACK
1.0000 | PACK | Freq: Two times a day (BID) | ORAL | Status: DC
  Administered 2021-01-10 – 2021-01-19 (×16): 1 via ORAL
  Filled 2021-01-09 (×17): qty 1

## 2021-01-09 MED ORDER — VANCOMYCIN HCL 1500 MG/300ML IV SOLN
1500.0000 mg | Freq: Once | INTRAVENOUS | Status: AC
  Administered 2021-01-09: 1500 mg via INTRAVENOUS
  Filled 2021-01-09: qty 300

## 2021-01-09 MED ORDER — VANCOMYCIN HCL 1000 MG/200ML IV SOLN
1000.0000 mg | Freq: Two times a day (BID) | INTRAVENOUS | Status: DC
  Administered 2021-01-10 – 2021-01-12 (×4): 1000 mg via INTRAVENOUS
  Filled 2021-01-09 (×7): qty 200

## 2021-01-09 MED ORDER — ENSURE MAX PROTEIN PO LIQD
11.0000 [oz_av] | Freq: Every day | ORAL | Status: DC
  Administered 2021-01-10 – 2021-01-18 (×9): 11 [oz_av] via ORAL
  Filled 2021-01-09 (×9): qty 330

## 2021-01-09 MED ORDER — HYDROCORTISONE 0.5 % EX OINT
TOPICAL_OINTMENT | Freq: Every day | CUTANEOUS | Status: DC | PRN
  Filled 2021-01-09: qty 28.35

## 2021-01-09 NOTE — Progress Notes (Signed)
PROGRESS NOTE  Tony Hicks QAS:[id number] DOB: 25-Jan-1966 DOA: 01/08/2021 PCP: Rema Fendt, NP   LOS: 1 day   Brief Narrative / Interim history: 55 year old male with DM2, polysubstance abuse (tobacco, alcohol, cocaine) who comes in with worsening left foot pain.  He has several ulcers there present for the last 2 months, seen by podiatry but overall without improvement.  Pain is gotten worse, ulcers have been getting larger and he has noticed some purulent discharges.  He eventually came to the ER and was admitted to the hospital.  Podiatry consulted.  Incidentally he is Covid 19 was positive  Subjective / 24h Interval events: Complains of left foot pain this morning.  Denies any cough, denies any shortness of breath.  He is very surprised about COVID-19.  He had a cough while using the [facility] at home but is normal for him.  No chest congestion.  No fever or chills.  Assessment & Plan: Principal Problem Sepsis secondary to diabetic foot ulcer with cellulitis-febrile in the ED, was tachycardic, had elevated lactic acid with likely source from his diabetic foot ulcer.  Podiatry consulted.   -ABI pending. -MRI of the foot pending as well -Continue ceftriaxone, add vancomycin  Active Problems COVID-19-asymptomatic, monitor.  He is on room air.  Chest x-ray unimpressive  Type 2 diabetes mellitus, with hyperglycemia-A1c 6.2.  Placed on sliding scale  Microcytic anemia, iron deficiency anemia-monitor, received IV iron, will place on oral iron on discharge  Hyponatremia-monitor, query related to EtOH  Polysubstance abuse-Patient admits to drinking significant amount of alcohol most days, smoking tobacco, and snorting cocaine recently. CIWA protocols initiated   PAD- Patient with decreased pulses noted in his lower extremities.  ABI pending  Scheduled Meds:  enoxaparin (LOVENOX) injection  40 mg Subcutaneous Q24H   folic acid  1 mg Oral Daily   gabapentin  300 mg Oral BID   insulin aspart   0-9 Units Subcutaneous TID WC   LORazepam  0-4 mg Intravenous Q6H   Followed by   Melene Muller ON 01/10/2021] LORazepam  0-4 mg Intravenous Q12H   multivitamin with minerals  1 tablet Oral Daily   nicotine  14 mg Transdermal Daily   sodium chloride flush  3 mL Intravenous Q12H   thiamine  100 mg Oral Daily   Or   thiamine  100 mg Intravenous Daily   Continuous Infusions:  cefTRIAXone (ROCEPHIN)  IV Stopped (01/08/21 1514)   ferumoxytol 510 mg (01/09/21 1217)   PRN Meds:.acetaminophen, HYDROcodone-acetaminophen, LORazepam **OR** LORazepam  Diet Orders (From admission, onward)     Start     Ordered   01/08/21 1430  Diet Carb Modified Fluid consistency: Thin; Room service appropriate? Yes  Diet effective now       Question Answer Comment  Diet-HS Snack? Nothing   Calorie Level Medium 1600-2000   Fluid consistency: Thin   Room service appropriate? Yes      01/08/21 1456            DVT prophylaxis: enoxaparin (LOVENOX) injection 40 mg Start: 01/08/21 1500     Code Status: Full Code  Family Communication: No family at bedside  Status is: Inpatient  Remains inpatient appropriate because:Inpatient level of care appropriate due to severity of illness  Dispo: The patient is from: Home              Anticipated d/c is to: Home              Patient currently is not medically stable  to d/c.   Difficult to place patient No  Level of care: Telemetry Medical  Consultants:  Podiatry   Procedures:  None   Microbiology  Blood cultures without growth  Antimicrobials: Ceftriaxone, vancomycin   Objective: Vitals:   01/08/21 1727 01/08/21 2045 01/09/21 0125 01/09/21 0832  BP: 121/70 (!) 149/65 (!) 112/56 119/71  Pulse: (!) 102 (!) 104 94 90  Resp: 16   19  Temp: 98.9 F (37.2 C) 100 F (37.8 C) (!) 101.3 F (38.5 C) (!) 100.5 F (38.1 C)  TempSrc:   Oral Oral  SpO2: 100% 100% 98% 100%    Intake/Output Summary (Last 24 hours) at 01/09/2021 1219 Last data filed at  01/08/2021 2234 Gross per 24 hour  Intake 3 ml  Output --  Net 3 ml   There were no vitals filed for this visit.  Examination:  Constitutional: NAD Eyes: no scleral icterus ENMT: Mucous membranes are moist.  Neck: normal, supple Respiratory: clear to auscultation bilaterally, no wheezing, no crackles. Normal respiratory effort. No accessory muscle use.  Cardiovascular: Regular rate and rhythm, no murmurs / rubs / gallops. No LE edema. Good peripheral pulses Abdomen: non distended, no tenderness. Bowel sounds positive.  Musculoskeletal: no clubbing / cyanosis.  Skin: no rashes other than the foot Neurologic: non focal   Data Reviewed: I have independently reviewed following labs and imaging studies   CBC: Recent Labs  Lab 01/08/21 0935  WBC 7.8  NEUTROABS 6.3  HGB 8.2*  HCT 27.7*  MCV 65.2*  PLT 387   Basic Metabolic Panel: Recent Labs  Lab 01/08/21 0935 01/09/21 0359  NA 131*  --   K 3.6  --   CL 97*  --   CO2 24  --   GLUCOSE 175*  --   BUN 5*  --   CREATININE 0.85  --   CALCIUM 9.2  --   MG  --  1.9  PHOS  --  4.9*   Liver Function Tests: Recent Labs  Lab 01/08/21 0935  AST 21  ALT 15  ALKPHOS 54  BILITOT 0.8  PROT 7.3  ALBUMIN 3.2*   Coagulation Profile: No results for input(s): INR, PROTIME in the last 168 hours. HbA1C: Recent Labs    01/08/21 1515  HGBA1C 6.2*   CBG: Recent Labs  Lab 01/08/21 0926 01/08/21 1639 01/09/21 0534 01/09/21 0827  GLUCAP 208* 121* 104* 108*    Recent Results (from the past 240 hour(s))  Culture, blood (routine x 2)     Status: None (Preliminary result)   Collection Time: 01/08/21 12:44 PM   Specimen: BLOOD  Result Value Ref Range Status   Specimen Description BLOOD RIGHT ANTECUBITAL  Final   Special Requests   Final    BOTTLES DRAWN AEROBIC AND ANAEROBIC Blood Culture results may not be optimal due to an inadequate volume of blood received in culture bottles   Culture   Final    NO GROWTH < 24  HOURS Performed at [facility], [address] [place], [place] [address]    Report Status PENDING  Incomplete  Culture, blood (routine x 2)     Status: None (Preliminary result)   Collection Time: 01/08/21 12:51 PM   Specimen: BLOOD  Result Value Ref Range Status   Specimen Description BLOOD RIGHT ANTECUBITAL  Final   Special Requests   Final    BOTTLES DRAWN AEROBIC AND ANAEROBIC Blood Culture results may not be optimal due to an inadequate volume of  blood received in culture bottles   Culture   Final    NO GROWTH < 24 HOURS Performed at [facility], [address], [place], [place] [address]    Report Status PENDING  Incomplete  SARS CORONAVIRUS 2 (TAT 6-24 HRS) Nasopharyngeal Nasopharyngeal Swab     Status: Abnormal   Collection Time: 01/08/21  3:26 PM   Specimen: Nasopharyngeal Swab  Result Value Ref Range Status   SARS Coronavirus 2 POSITIVE (A) NEGATIVE Final    Comment: (NOTE) SARS-CoV-2 target nucleic acids are DETECTED.  The SARS-CoV-2 RNA is generally detectable in upper and lower respiratory specimens during the acute phase of infection. Positive results are indicative of the presence of SARS-CoV-2 RNA. Clinical correlation with patient history and other diagnostic information is  necessary to determine patient infection status. Positive results do not rule out bacterial infection or co-infection with other viruses.  The expected result is Negative.  Fact Sheet for Patients: HairSlick.no  Fact Sheet for Healthcare Providers: quierodirigir.com  This test is not yet approved or cleared by the [place] FDA and  has been authorized for detection and/or diagnosis of SARS-CoV-2 by FDA under an Emergency Use Authorization (EUA). This EUA will remain  in effect (meaning this test can be used) for the duration of the COVID-19 declaration under Section 564(b)(1) of the Act, 21 U. S.C. section  360bbb-3(b)(1), unless the authorization is terminated or revoked sooner.   Performed at [facility], [address], [place], [place] [address]      Radiology Studies: DG CHEST PORT 1 VIEW  Result Date: 01/09/2021 CLINICAL DATA:  COVID.  Cough. EXAM: PORTABLE CHEST 1 VIEW COMPARISON:  12/21/2020. FINDINGS: Mediastinum hilar structures normal. Heart size upper limits normal. Low lung volumes. Mild bilateral interstitial prominence. Mild interstitial edema and/or pneumonitis could present in this fashion. No pleural effusion or pneumothorax. IMPRESSION: 1.  Heart size upper limits normal. 2. Low lung volumes. Mild bilateral interstitial prominence. Mild interstitial edema and/or pneumonitis could present this fashion. Electronically Signed   By: Maisie Fus  Register   On: 01/09/2021 08:46     Pamella Pert, MD, PhD [facility]  Between 7 am - 7 pm I am available, please contact me via Amion (for emergencies) or Securechat (non urgent messages)  Between 7 pm - 7 am I am not available, please contact night coverage MD/APP via Amion

## 2021-01-09 NOTE — Consult Note (Addendum)
Hospital Consult    Reason for Consult:  severe PAD W foot ulcers Requesting Physician:  Dr. Elvera Lennox MRN #:  [id number]  History of Present Illness: This is a 55 y.o. male with medical history significant for diabetes mellitus and polysubstance abuse ( tobacco, alcohol, cocaine) who presented to ED with worsening left foot pain and ulcerations. He states that he has had left foot wounds for approximately 2 months. However, over past several days he feels his left foot has become darker, swollen, more painful and he has noticed puss and bloody drainage from his left 4th toe and heel wounds. He was previously seeing podiatry for these wounds earlier in the year. He additionally has presented to ED on 2 prior occasions for foot pain. His pain is currently shooting  pains that go into his foot. He says he is unable to ambulate or sleep because of the pain. He does normally walk without difficulty. He reports no issues with his right lower extremity. He denies any fevers or chills. He is a current smoker about 1/2 ppd. He also drinks alcohol (2-8 beers per day) and uses cocaine regularly.   Upon admission into the emergency department patient was found to be febrile up to 102.9 F, pulse 100-114. Labs significant for hemoglobin 8.2, sodium 131, glucose 175, and lactic acid 3.  X-rays of the left foot noted soft tissue ulceration over the heel with no bony involvement. Patient has been started on Rocephin IV. MRI of the foot is pending as well as arterial duplex. ABI's obtain show significant arterial disease of the left lower extremity with ABI of 0.36 on the left. Vascular surgery has been consulted for evaluation of peripheral artery disease in left leg with non healing wounds  Past Medical History:  Diagnosis Date   Diabetes mellitus without complication (HCC)    Syphilis    Treated  02/14/2019    Past Surgical History:  Procedure Laterality Date   NO PAST SURGERIES      No Known  Allergies  Prior to Admission medications   Medication Sig Start Date End Date Taking? Authorizing Provider  bacitracin ointment Apply 1 application topically 2 (two) times daily. 12/26/20  Yes Zonia Kief, Amy J, NP  ibuprofen (ADVIL) 600 MG tablet Take 1 tablet (600 mg total) by mouth every 8 (eight) hours as needed. 12/26/20  Yes Zonia Kief, Amy J, NP  metFORMIN (GLUCOPHAGE) 500 MG tablet Take 1 tablet (500 mg total) by mouth daily with breakfast. 02/18/19  Yes Newlin, Enobong, MD  gabapentin (NEURONTIN) 300 MG capsule Take 1 capsule (300 mg total) by mouth 2 (two) times daily. Patient not taking: Reported on 01/08/2021 02/17/19   Hoy Register, MD    Social History   Socioeconomic History   Marital status: Single    Spouse name: Not on file   Number of children: Not on file   Years of education: Not on file   Highest education level: Not on file  Occupational History   Not on file  Tobacco Use   Smoking status: Every Day    Packs/day: 0.50    Pack years: 0.00    Types: Cigarettes   Smokeless tobacco: Never  Vaping Use   Vaping Use: Never used  Substance and Sexual Activity   Alcohol use: Yes    Alcohol/week: 24.0 standard drinks    Types: 4 Cans of beer, 20 Standard drinks or equivalent per week    Comment: Approx 20-25 beers week   Drug use: Yes  Frequency: 1.0 times per week    Types: Cocaine    Comment: smoke crack   Sexual activity: Not on file  Other Topics Concern   Not on file  Social History Narrative   Not on file   Social Determinants of Health   Financial Resource Strain: Not on file  Food Insecurity: Not on file  Transportation Needs: Not on file  Physical Activity: Not on file  Stress: Not on file  Social Connections: Not on file  Intimate Partner Violence: Not on file    Family History  Family history unknown: Yes    ROS: Otherwise negative unless mentioned in HPI  Physical Examination  Vitals:   01/09/21 0125 01/09/21 0832  BP: (!) 112/56  119/71  Pulse: 94 90  Resp:  19  Temp: (!) 101.3 F (38.5 C) (!) 100.5 F (38.1 C)  SpO2: 98% 100%   Body mass index is 29.86 kg/m.  General:  well nourished, in discomfort but no acute distress Gait: Not observed HENT: WNL, normocephalic Pulmonary: normal non-labored breathing Cardiac: regular rate and rhythm Abdomen:  soft, NT/ND, no masses Vascular Exam/Pulses: 2+ femoral pulses bilaterally, no palpable distal pulses. Doppler right At/ PT/ peroneal signals. Doppler left popliteal signal.Doppler monophasic AT and PT signals. Bilateral feet are warm. Coolness of left toes. Motor and sensation intact Extremities: with ischemic changes of left foot, darkening of left foot, foot is edematous around ankle, with cellulitis; with open wounds of 4th toe around nail bed as well as large ulceration on left heel and eschar on distal posterior left leg Musculoskeletal: no muscle wasting or atrophy  Neurologic: A&O X 3;  No focal weakness or paresthesias are detected; speech is fluent/normal Psychiatric:  The pt has Normal affect. Lymph:  Unremarkable  CBC    Component Value Date/Time   WBC 7.8 01/08/2021 0935   RBC 4.25 01/08/2021 0935   HGB 8.2 (L) 01/08/2021 0935   HGB 12.5 (L) 02/17/2019 1543   HCT 27.7 (L) 01/08/2021 0935   HCT 37.7 02/17/2019 1543   PLT 387 01/08/2021 0935   PLT 249 02/17/2019 1543   MCV 65.2 (L) 01/08/2021 0935   MCV 82 02/17/2019 1543   MCH 19.3 (L) 01/08/2021 0935   MCHC 29.6 (L) 01/08/2021 0935   RDW 19.1 (H) 01/08/2021 0935   RDW 13.7 02/17/2019 1543   LYMPHSABS 0.5 (L) 01/08/2021 0935   LYMPHSABS 1.6 02/17/2019 1543   MONOABS 0.9 01/08/2021 0935   EOSABS 0.1 01/08/2021 0935   EOSABS 0.1 02/17/2019 1543   BASOSABS 0.1 01/08/2021 0935   BASOSABS 0.0 02/17/2019 1543    BMET    Component Value Date/Time   NA 131 (L) 01/08/2021 0935   NA 140 02/17/2019 1543   K 3.6 01/08/2021 0935   CL 97 (L) 01/08/2021 0935   CO2 24 01/08/2021 0935   GLUCOSE 175  (H) 01/08/2021 0935   BUN 5 (L) 01/08/2021 0935   BUN 8 02/17/2019 1543   CREATININE 0.85 01/08/2021 0935   CALCIUM 9.2 01/08/2021 0935   GFRNONAA >60 01/08/2021 0935   GFRAA 101 02/17/2019 1543    COAGS: No results found for: INR, PROTIME   Non-Invasive Vascular Imaging:      ABI Findings:  +---------+------------------+-----+----------+--------+  Right    Rt Pressure (mmHg)IndexWaveform  Comment   +---------+------------------+-----+----------+--------+  Brachial 137                    triphasic           +---------+------------------+-----+----------+--------+  PTA      181               1.32 biphasic            +---------+------------------+-----+----------+--------+  DP       98                0.72 monophasic          +---------+------------------+-----+----------+--------+  Great Toe52                0.38 Abnormal            +---------+------------------+-----+----------+--------+   +---------+------------------+-----+-------------------+-------+  Left     Lt Pressure (mmHg)IndexWaveform           Comment  +---------+------------------+-----+-------------------+-------+  Brachial 134                    triphasic                   +---------+------------------+-----+-------------------+-------+  PTA      50                0.36 dampened monophasic         +---------+------------------+-----+-------------------+-------+  DP       0                 0.00 absent                      +---------+------------------+-----+-------------------+-------+  Great Toe0                 0.00 Absent                      +---------+------------------+-----+-------------------+-------+   +-------+-----------+-----------+------------+------------+  ABI/TBIToday's ABIToday's TBIPrevious ABIPrevious TBI  +-------+-----------+-----------+------------+------------+  Right  1.32       0.38                                  +-------+-----------+-----------+------------+------------+  Left   0.36       0.00                                 +-------+-----------+-----------+------------+------------+     Statin:  No. Beta Blocker:  No. Aspirin:  No. ACEI:  No. ARB:  No. CCB use:  No Other antiplatelets/anticoagulants:  No.    ASSESSMENT/PLAN: This is a 55 y.o. male who presents with sepsis secondary to worsening left lower extremity wounds concerning for osteomyelitis. Febrile and tachycardic with initial lactic acid of 3 on presentation. ABI of 0.36 on the left with no palpable distal pulses.   Will likely need debridement of his lower extremity wounds if his leg is salvageable I discussed with patient that he is high risk for limb loss Pending results of MRI of the foot and arterial duplex of the left lower extremity we can provide further recommendations He will need Aortogram, arteriogram of left lower extremity with possible intervention On call vascular surgeon, Dr. Lenell AntuHawken will follow up after non invasive studies and provide further treatment recommendations    Graceann CongressCorrina Baglia PA-C [facility] [phone] 01/09/2021  1:20 PM  VASCULAR STAFF ADDENDUM: I have independently interviewed and examined the patient. I agree with the above.  LLE CLTI with left foot wounds.  Palpable femoral pulses No palpable pedal pulses bilaterally Earliest availability  for LLE angiography with me is 01/16/21 Patient does not need to remain inpatient until then; can do this as an outpatient. Best medical therapy for PAD: ASA 81mg  PO QD, Atorvastatin 40mg  PO QD.   . , MD [facility] Phone Number: [phone] 01/09/2021 2:46 PM

## 2021-01-09 NOTE — Progress Notes (Signed)
Ativan scheduled not given because pt is sleepy all the day

## 2021-01-09 NOTE — Progress Notes (Signed)
VASCULAR LAB    Left lower extremity arterial duplex has been performed.  See CV proc for preliminary results.   Daianna Vasques, RVT 01/09/2021, 2:54 PM

## 2021-01-09 NOTE — Progress Notes (Signed)
Initial Nutrition Assessment  DOCUMENTATION CODES:   Not applicable  INTERVENTION:   -1 packet Juven BID, each packet provides 95 calories, 2.5 grams of protein (collagen), and 9.8 grams of carbohydrate (3 grams sugar); also contains 7 grams of L-arginine and L-glutamine, 300 mg vitamin C, 15 mg vitamin E, 1.2 mcg vitamin B-12, 9.5 mg zinc, 200 mg calcium, and 1.5 g  Calcium Beta-hydroxy-Beta-methylbutyrate to support wound healing  -Ensure Max po daily, each supplement provides 150 kcal and 30 grams of protein -MVI with minerals daily  NUTRITION DIAGNOSIS:   Increased nutrient needs related to wound healing as evidenced by estimated needs.  GOAL:   Patient will meet greater than or equal to 90% of their needs  MONITOR:   PO intake, Supplement acceptance, Labs, Weight trends, Skin, I & O's  REASON FOR ASSESSMENT:   Consult Wound healing  ASSESSMENT:   Tony Hicks is a 55 y.o. male with medical history significant of diabetes mellitus type II and polysubstance abuse(tobacco, alcohol, and cocaine) presents with complaints of left foot pain.  Pt admitted with sepsis related to lt foot DM ulcer with cellulitis.   Reviewed I/O's: +3 ml x 24 hours  UOP: 900 ml x 24 hours  Per MD notes, x-ray does not reveal osteomyelitis. MRI of foot and ABI pending. Podiatry has been consulted.   Attempted to speak with pt x 2, however, in with other providers at times of both visits. Unable to obtain further nutrition-related history or complete nutrition-focused physical exam at this time.   Reviewed wt hx; wt has been stable over the past month. Noted mild wt loss over the past 2 years.   Medications reviewed and include ativan and thiamine.   Lab Results  Component Value Date   HGBA1C 6.2 (H) 01/08/2021   PTA DM medications are 500 mg metformin daily.   Labs reviewed: CBGS: 85-108 (inpatient orders for glycemic control are 0-9 units insulin aspart TID with meals).    Diet Order:    Diet Order             Diet Carb Modified Fluid consistency: Thin; Room service appropriate? Yes  Diet effective now                   EDUCATION NEEDS:   No education needs have been identified at this time  Skin:  Skin Assessment: Reviewed RN Assessment  Last BM:  01/08/21  Height:   Ht Readings from Last 1 Encounters:  12/21/20 5\' 6"  (1.676 m)    Weight:   Wt Readings from Last 1 Encounters:  01/09/21 83.9 kg    Ideal Body Weight:  64.5 kg  BMI:  Body mass index is 29.86 kg/m.  Estimated Nutritional Needs:   Kcal:  1950-2150  Protein:  100-115 grams  Fluid:  > 1.9 L    03/12/21, RD, LDN, CDCES Registered Dietitian II Certified Diabetes Care and Education Specialist Please refer to [facility] for RD and/or RD on-call/weekend/after hours pager

## 2021-01-09 NOTE — Progress Notes (Signed)
Subjective: 55 year old male presents to the emergency department yesterday for worsening ulcers, infection of his left foot.  He was admitted through identified.  MRI is pending.  Arterial studies performed today which did reveal significant PAD.  States he is doing well today without any fevers or chills.  Objective: AAO x3, NAD Nonpalpable pulses present.  Overall exam is unchanged compared to yesterday.  Ulcerations with eschar present to the posterior heel, posterior Achilles tendon.  There is no significant purulence identified today there is no fluctuation crepitation but there is edema to the foot with hyperpigmented changes to the foot on the left side.  Assessment: Left foot ulceration of the severe PAD  Plan: At this point ABI severely abnormal.  Recommend vascular surgery consultation.  If the limb is salvageable would recommend MRI and likely surgical debridement of the wounds.  MRI is pending.  Continue IV antibiotics.  Ovid Curd, DPM

## 2021-01-09 NOTE — Progress Notes (Signed)
Pharmacy Antibiotic Note  Tony Hicks is a 55 y.o. male admitted on 01/08/2021 with M Foot wound infection. Pharmacy has been consulted for vancomycin dosing. Patient is also on ceftriaxone.  WBC wnl, Tmax 102.9.  7/6 Vancomycin 1000mg  Q 12 hr Scr used: 0.85 mg/dL Weight: 84 kg Vd coeff: 0.72 L/kg Est AUC: 420  Plan: Vancomycin 1500mg  x1 then 1000mg  q12hr Ceftriaxone per MD Monitor cultures, clinical status, renal fx, vanc levels Narrow abx as able and f/u duration    Height:  (per char 6/17) Weight: 83.9 kg (185 lb)  Temp (24hrs), Avg:100.7 F (38.2 C), Min:98.9 F (37.2 C), Max:102.9 F (39.4 C)  Recent Labs  Lab 01/08/21 0935 01/08/21 1244 01/08/21 1515  WBC 7.8  --   --   CREATININE 0.85  --   --   LATICACIDVEN  --  3.0* 1.6    Estimated Creatinine Clearance: 100.9 mL/min (by C-G formula based on SCr of 0.85 mg/dL).    No Known Allergies  Antimicrobials this admission: Vanc 7/6 >>  CTX 7/5 >>     Microbiology results: 7/5 BCx: ngtd  7/5 COVID +     Thank you for allowing pharmacy to be a part of this patient's care.  9/5, PharmD, BCPS, BCCP Clinical Pharmacist  Please check AMION for all [facility] Pharmacy phone numbers After 10:00 PM, call Main Pharmacy [phone]

## 2021-01-10 DIAGNOSIS — L97509 Non-pressure chronic ulcer of other part of unspecified foot with unspecified severity: Secondary | ICD-10-CM

## 2021-01-10 DIAGNOSIS — U071 COVID-19: Secondary | ICD-10-CM

## 2021-01-10 LAB — GLUCOSE, CAPILLARY
Glucose-Capillary: 91 mg/dL (ref 70–99)
Glucose-Capillary: 90 mg/dL (ref 70–99)
Glucose-Capillary: 108 mg/dL — ABNORMAL HIGH (ref 70–99)
Glucose-Capillary: 165 mg/dL — ABNORMAL HIGH (ref 70–99)
Glucose-Capillary: 89 mg/dL (ref 70–99)

## 2021-01-10 LAB — COMPREHENSIVE METABOLIC PANEL
ALT: 15 U/L (ref 0–44)
AST: 20 U/L (ref 15–41)
Albumin: 2.9 g/dL — ABNORMAL LOW (ref 3.5–5.0)
Alkaline Phosphatase: 44 U/L (ref 38–126)
Anion gap: 7 (ref 5–15)
BUN: <5 mg/dL — ABNORMAL LOW (ref 6–20)
CO2: 26 mmol/L (ref 22–32)
Calcium: 8.8 mg/dL — ABNORMAL LOW (ref 8.9–10.3)
Chloride: 100 mmol/L (ref 98–111)
Creatinine, Ser: 0.87 mg/dL (ref 0.61–1.24)
GFR, Estimated: >60 mL/min (ref >60)
Glucose, Bld: 99 mg/dL (ref 70–99)
Potassium: 4.1 mmol/L (ref 3.5–5.1)
Sodium: 133 mmol/L — ABNORMAL LOW (ref 135–145)
Total Bilirubin: 0.6 mg/dL (ref 0.3–1.2)
Total Protein: 6.7 g/dL (ref 6.5–8.1)

## 2021-01-10 LAB — CBC
HCT: 26.6 % — ABNORMAL LOW (ref 39.0–52.0)
Hemoglobin: 8.1 g/dL — ABNORMAL LOW (ref 13.0–17.0)
MCH: 19.4 pg — ABNORMAL LOW (ref 26.0–34.0)
MCHC: 30.5 g/dL (ref 30.0–36.0)
MCV: 63.6 fL — ABNORMAL LOW (ref 80.0–100.0)
Platelets: 331 10*3/uL (ref 150–400)
RBC: 4.18 MIL/uL — ABNORMAL LOW (ref 4.22–5.81)
RDW: 19.2 % — ABNORMAL HIGH (ref 11.5–15.5)
WBC: 5.4 10*3/uL (ref 4.0–10.5)
nRBC: 0 % (ref 0.0–0.2)

## 2021-01-10 LAB — D-DIMER, QUANTITATIVE: D-Dimer, Quant: 2.7 ug/mL-FEU — ABNORMAL HIGH (ref 0.00–0.50)

## 2021-01-10 LAB — C-REACTIVE PROTEIN: CRP: 3.9 mg/dL — ABNORMAL HIGH (ref <1.0)

## 2021-01-10 MED ORDER — ASPIRIN EC 81 MG PO TBEC
81.0000 mg | DELAYED_RELEASE_TABLET | Freq: Every day | ORAL | Status: DC
  Administered 2021-01-10 – 2021-01-19 (×9): 81 mg via ORAL
  Filled 2021-01-10 (×9): qty 1

## 2021-01-10 MED ORDER — ATORVASTATIN CALCIUM 40 MG PO TABS
40.0000 mg | ORAL_TABLET | Freq: Every day | ORAL | Status: DC
  Administered 2021-01-10 – 2021-01-19 (×9): 40 mg via ORAL
  Filled 2021-01-10 (×9): qty 1

## 2021-01-10 NOTE — Progress Notes (Signed)
Subjective: 55 year old male presents to the emergency department for worsening ulcers, infection of his left foot.  He was admitted through identified.  MRI is still pending.  Scheduled for angio tomorrow with vascular surgery.  Objective: AAO x3, NAD Nonpalpable pulses present.  Overall exam is unchanged compared to yesterday.  Ulcerations with eschar present to the posterior heel, posterior Achilles tendon.  There is no significant purulence identified today there is no fluctuation crepitation but there is edema to the foot with hyperpigmented changes to the foot on the left side.  Assessment: Left foot ulceration of the severe PAD  Plan: Plan for angio tomorrow with vascular surgery.  Continue IV antibiotics.  MRI pending.  If revascularization is successful consider wound debridement if needed pending MRI.  Ovid Curd, DPM

## 2021-01-10 NOTE — Progress Notes (Signed)
PROGRESS NOTE  Tony Hicks ION:[id number] DOB: 10/21/1965 DOA: 01/08/2021 PCP: Rema Fendt, NP   LOS: 2 days   Brief Narrative / Interim history: 55 year old male with DM2, polysubstance abuse (tobacco, alcohol, cocaine) who comes in with worsening left foot pain.  He has several ulcers there present for the last 2 months, seen by podiatry but overall without improvement.  Pain is gotten worse, ulcers have been getting larger and he has noticed some purulent discharges.  He eventually came to the ER and was admitted to the hospital.  Podiatry consulted.  Incidentally he is Covid 19 was positive  Subjective / 24h Interval events: He is sleeping when I entered the room but wakes up easily.  Denies any shortness of breath, denies any chest pain, no cough or chest congestion  Assessment & Plan: Principal Problem Sepsis secondary to diabetic foot ulcer with cellulitis-febrile in the ED, was tachycardic, had elevated lactic acid with likely source from his diabetic foot ulcer.  Podiatry consulted.   -ABI pending. -MRI of the foot still pending this morning -Continue vancomycin and ceftriaxone  Active Problems COVID-19-remains asymptomatic and on room air.  No cough, no symptoms.  Type 2 diabetes mellitus, with hyperglycemia-A1c 6.2.  Placed on sliding scale  Microcytic anemia, iron deficiency anemia-monitor, received IV iron, will place on oral iron on discharge  Hyponatremia-monitor, query related to EtOH  Polysubstance abuse-Patient admits to drinking significant amount of alcohol most days, smoking tobacco, and snorting cocaine recently. CIWA protocols initiated   PAD- Patient with decreased pulses noted in his lower extremities.  ABI showed severe PAD on the left and vascular surgery consulted.  Scheduled Meds:  aspirin EC  81 mg Oral Daily   atorvastatin  40 mg Oral Daily   enoxaparin (LOVENOX) injection  40 mg Subcutaneous Q24H   folic acid  1 mg Oral Daily   gabapentin  300  mg Oral BID   insulin aspart  0-9 Units Subcutaneous TID WC   LORazepam  0-4 mg Intravenous Q6H   Followed by   LORazepam  0-4 mg Intravenous Q12H   multivitamin with minerals  1 tablet Oral Daily   nicotine  14 mg Transdermal Daily   nutrition supplement (JUVEN)  1 packet Oral BID BM   Ensure Max Protein  11 oz Oral QHS   sodium chloride flush  3 mL Intravenous Q12H   thiamine  100 mg Oral Daily   Or   thiamine  100 mg Intravenous Daily   Continuous Infusions:  cefTRIAXone (ROCEPHIN)  IV 2 g (01/09/21 1343)   vancomycin     PRN Meds:.acetaminophen, HYDROcodone-acetaminophen, hydrocortisone cream, LORazepam **OR** LORazepam  Diet Orders (From admission, onward)     Start     Ordered   01/08/21 1430  Diet Carb Modified Fluid consistency: Thin; Room service appropriate? Yes  Diet effective now       Question Answer Comment  Diet-HS Snack? Nothing   Calorie Level Medium 1600-2000   Fluid consistency: Thin   Room service appropriate? Yes      01/08/21 1456            DVT prophylaxis: enoxaparin (LOVENOX) injection 40 mg Start: 01/08/21 1500     Code Status: Full Code  Family Communication: No family at bedside  Status is: Inpatient  Remains inpatient appropriate because:Inpatient level of care appropriate due to severity of illness  Dispo: The patient is from: Home  Anticipated d/c is to: Home              Patient currently is not medically stable to d/c.   Difficult to place patient No  Level of care: Telemetry Medical  Consultants:  Podiatry  Vascular surgery  Procedures:  None   Microbiology  Blood cultures without growth  Antimicrobials: Ceftriaxone, vancomycin   Objective: Vitals:   01/09/21 1552 01/09/21 1800 01/09/21 2135 01/10/21 0900  BP: 120/66  100/71 (!) 132/59  Pulse: 91  92 88  Resp: 18   18  Temp: (!) 101.1 F (38.4 C) 99.3 F (37.4 C) 98.5 F (36.9 C) 98.9 F (37.2 C)  TempSrc: Oral Oral Oral Oral  SpO2: 99%   100% 100%  Weight:        Intake/Output Summary (Last 24 hours) at 01/10/2021 1138 Last data filed at 01/09/2021 1800 Gross per 24 hour  Intake 980 ml  Output 1850 ml  Net -870 ml    Filed Weights   01/09/21 1200  Weight: 83.9 kg    Examination:  Constitutional: NAD Eyes: No icterus ENMT: mmm Neck: normal, supple Respiratory: Clear bilaterally, no wheezing or crackles Cardiovascular: Regular rate and rhythm, no murmurs, no edema Abdomen: Soft, NT, ND, bowel sounds positive Musculoskeletal: no clubbing / cyanosis.  Skin: No new rashes Neurologic: No focal deficits  Data Reviewed: I have independently reviewed following labs and imaging studies   CBC: Recent Labs  Lab 01/08/21 0935 01/10/21 0638  WBC 7.8 5.4  NEUTROABS 6.3  --   HGB 8.2* 8.1*  HCT 27.7* 26.6*  MCV 65.2* 63.6*  PLT 387 331    Basic Metabolic Panel: Recent Labs  Lab 01/08/21 0935 01/09/21 0359 01/10/21 0638  NA 131*  --  133*  K 3.6  --  4.1  CL 97*  --  100  CO2 24  --  26  GLUCOSE 175*  --  99  BUN 5*  --  <5*  CREATININE 0.85  --  0.87  CALCIUM 9.2  --  8.8*  MG  --  1.9  --   PHOS  --  4.9*  --     Liver Function Tests: Recent Labs  Lab 01/08/21 0935 01/10/21 0638  AST 21 20  ALT 15 15  ALKPHOS 54 44  BILITOT 0.8 0.6  PROT 7.3 6.7  ALBUMIN 3.2* 2.9*    Coagulation Profile: No results for input(s): INR, PROTIME in the last 168 hours. HbA1C: Recent Labs    01/08/21 1515  HGBA1C 6.2*    CBG: Recent Labs  Lab 01/09/21 1211 01/09/21 1548 01/09/21 2147 01/10/21 0636 01/10/21 0902  GLUCAP 85 95 105* 91 90     Recent Results (from the past 240 hour(s))  Culture, blood (routine x 2)     Status: None (Preliminary result)   Collection Time: 01/08/21 12:44 PM   Specimen: BLOOD  Result Value Ref Range Status   Specimen Description BLOOD RIGHT ANTECUBITAL  Final   Special Requests   Final    BOTTLES DRAWN AEROBIC AND ANAEROBIC Blood Culture results may not be  optimal due to an inadequate volume of blood received in culture bottles   Culture   Final    NO GROWTH 2 DAYS Performed at [facility], [address], [place], [place] [address]    Report Status PENDING  Incomplete  Culture, blood (routine x 2)     Status: None (Preliminary result)   Collection Time: 01/08/21 12:51 PM  Specimen: BLOOD  Result Value Ref Range Status   Specimen Description BLOOD RIGHT ANTECUBITAL  Final   Special Requests   Final    BOTTLES DRAWN AEROBIC AND ANAEROBIC Blood Culture results may not be optimal due to an inadequate volume of blood received in culture bottles   Culture   Final    NO GROWTH 2 DAYS Performed at [facility], [address] [place], [place] [address]    Report Status PENDING  Incomplete  SARS CORONAVIRUS 2 (TAT 6-24 HRS) Nasopharyngeal Nasopharyngeal Swab     Status: Abnormal   Collection Time: 01/08/21  3:26 PM   Specimen: Nasopharyngeal Swab  Result Value Ref Range Status   SARS Coronavirus 2 POSITIVE (A) NEGATIVE Final    Comment: (NOTE) SARS-CoV-2 target nucleic acids are DETECTED.  The SARS-CoV-2 RNA is generally detectable in upper and lower respiratory specimens during the acute phase of infection. Positive results are indicative of the presence of SARS-CoV-2 RNA. Clinical correlation with patient history and other diagnostic information is  necessary to determine patient infection status. Positive results do not rule out bacterial infection or co-infection with other viruses.  The expected result is Negative.  Fact Sheet for Patients: HairSlick.nohttps://www.fda.gov/media/138098/download  Fact Sheet for Healthcare Providers: quierodirigir.comhttps://www.fda.gov/media/138095/download  This test is not yet approved or cleared by the [place] FDA and  has been authorized for detection and/or diagnosis of SARS-CoV-2 by FDA under an Emergency Use Authorization (EUA). This EUA will remain  in effect (meaning this test can be used) for  the duration of the COVID-19 declaration under Section 564(b)(1) of the Act, 21 U. S.C. section 360bbb-3(b)(1), unless the authorization is terminated or revoked sooner.   Performed at [facility], [address] [place], [place] [address]       Radiology Studies: VAS US LOWER EXTREMITY ARTERIAL DUPLEX  Result Date: 01/09/2021 LOWER EXTREMITY ARTERIAL DUPLEX STUDY Patient Name:  Tony QuintHARVEY Hicks  Date of Exam:   01/09/2021 Medical Rec #: [id number]     Accession #:    [id number] Date of Birth: 04-Jan-1966     Patient Gender: M Patient Age:   [age]Y Exam [facility]  [facility] Procedure:      VAS US LOWER EXTREMITY ARTERIAL DUPLEX Referring Phys: [id number] CORRINA BAGLIA --------------------------------------------------------------------------------  Indications: Rest pain. High Risk Factors: Diabetes. Other Factors: Tobacco and substance abuse.  Current ABI: R: 1.3 TBI 0.38 L:0.36 TBI 0.0 Limitations: Rest pain, patient constantly moving, involuntary kicking Comparison Study: No prior study Performing Technologist: Sherren Kernsandace Kanady RVS  Examination Guidelines: A complete evaluation includes B-mode imaging, spectral Doppler, color Doppler, and power Doppler as needed of all accessible portions of each vessel. Bilateral testing is considered an integral part of a complete examination. Limited examinations for reoccurring indications may be performed as noted.  +----------+--------+-----+--------+-------------------+--------+ LEFT      PSV cm/sRatioStenosisWaveform           Comments +----------+--------+-----+--------+-------------------+--------+ CFA Prox  141                                              +----------+--------+-----+--------+-------------------+--------+ DFA       69                                               +----------+--------+-----+--------+-------------------+--------+  SFA Prox  62                                                +----------+--------+-----+--------+-------------------+--------+ SFA Mid   122                                              +----------+--------+-----+--------+-------------------+--------+ SFA Distal29                                               +----------+--------+-----+--------+-------------------+--------+ POP Prox  105                                              +----------+--------+-----+--------+-------------------+--------+ POP Mid                        absent                      +----------+--------+-----+--------+-------------------+--------+ POP Distal                     absent                      +----------+--------+-----+--------+-------------------+--------+ ATA Distal21                   dampened monophasic         +----------+--------+-----+--------+-------------------+--------+ PTA Prox  25                   dampened monophasic         +----------+--------+-----+--------+-------------------+--------+   Summary: Left: Stenosis noted mid SFA and distal FA/proximal popliteal artery. Mid to distal popliteal artery appears occluded. Minimal flow noted in the AT and PT arteries.  See table(s) above for measurements and observations. Electronically signed by Heath Lark on 01/09/2021 at 5:25:28 PM.    Final      Pamella Pert, MD, PhD [facility]  Between 7 am - 7 pm I am available, please contact me via Amion (for emergencies) or Securechat (non urgent messages)  Between 7 pm - 7 am I am not available, please contact night coverage MD/APP via Amion

## 2021-01-10 NOTE — Progress Notes (Signed)
Called MRI,procedure might be done between 1-3:30 pm

## 2021-01-10 NOTE — Progress Notes (Addendum)
  Progress Note    01/10/2021 8:00 AM * No surgery found *  Subjective:  Patient sleeping...did not awaken   Vitals:   01/09/21 1800 01/09/21 2135  BP:  100/71  Pulse:  92  Resp:    Temp: 99.3 F (37.4 C) 98.5 F (36.9 C)  SpO2:  100%    Physical Exam: Deferred Foul odor in room secondary to left foot wounds  CBC    Component Value Date/Time   WBC 5.4 01/10/2021 0638   RBC 4.18 (L) 01/10/2021 0638   HGB 8.1 (L) 01/10/2021 0638   HGB 12.5 (L) 02/17/2019 1543   HCT 26.6 (L) 01/10/2021 0638   HCT 37.7 02/17/2019 1543   PLT 331 01/10/2021 0638   PLT 249 02/17/2019 1543   MCV 63.6 (L) 01/10/2021 0638   MCV 82 02/17/2019 1543   MCH 19.4 (L) 01/10/2021 0638   MCHC 30.5 01/10/2021 0638   RDW 19.2 (H) 01/10/2021 0638   RDW 13.7 02/17/2019 1543   LYMPHSABS 0.5 (L) 01/08/2021 0935   LYMPHSABS 1.6 02/17/2019 1543   MONOABS 0.9 01/08/2021 0935   EOSABS 0.1 01/08/2021 0935   EOSABS 0.1 02/17/2019 1543   BASOSABS 0.1 01/08/2021 0935   BASOSABS 0.0 02/17/2019 1543    BMET    Component Value Date/Time   NA 133 (L) 01/10/2021 0638   NA 140 02/17/2019 1543   K 4.1 01/10/2021 0638   CL 100 01/10/2021 0638   CO2 26 01/10/2021 0638   GLUCOSE 99 01/10/2021 0638   BUN <5 (L) 01/10/2021 0638   BUN 8 02/17/2019 1543   CREATININE 0.87 01/10/2021 0638   CALCIUM 8.8 (L) 01/10/2021 0638   GFRNONAA >60 01/10/2021 0638   GFRAA 101 02/17/2019 1543     Intake/Output Summary (Last 24 hours) at 01/10/2021 0800 Last data filed at 01/09/2021 1800 Gross per 24 hour  Intake 980 ml  Output 1850 ml  Net -870 ml    HOSPITAL MEDICATIONS Scheduled Meds:  enoxaparin (LOVENOX) injection  40 mg Subcutaneous Q24H   folic acid  1 mg Oral Daily   gabapentin  300 mg Oral BID   insulin aspart  0-9 Units Subcutaneous TID WC   LORazepam  0-4 mg Intravenous Q6H   Followed by   LORazepam  0-4 mg Intravenous Q12H   multivitamin with minerals  1 tablet Oral Daily   nicotine  14 mg Transdermal  Daily   nutrition supplement (JUVEN)  1 packet Oral BID BM   Ensure Max Protein  11 oz Oral QHS   sodium chloride flush  3 mL Intravenous Q12H   thiamine  100 mg Oral Daily   Or   thiamine  100 mg Intravenous Daily   Continuous Infusions:  cefTRIAXone (ROCEPHIN)  IV 2 g (01/09/21 1343)   vancomycin     PRN Meds:.acetaminophen, HYDROcodone-acetaminophen, hydrocortisone cream, LORazepam **OR** LORazepam  Assessment and Plan: 55 y.o. male who presents with sepsis secondary to worsening left lower extremity wounds concerning for osteomyelitis. Critical limb ischemia without palpable pulses. Antibiotics continue. Left foot MRI pending. Tm 100.7 Needs arteriogram>>timing TBD   Wendi Maya, PA-C [facility] [phone] 01/10/2021  8:00 AM   VASCULAR STAFF ADDENDUM: I agree with the above.  Plan LLE angiogram for critical limb ischemia tomorrow afternoon. NPO after midnight. Order for consent entered.  Rande Brunt. Lenell Antu, MD [facility] Phone Number: [phone] 01/10/2021 1:20 PM

## 2021-01-10 NOTE — Progress Notes (Signed)
Nutrition Follow-up  DOCUMENTATION CODES:   Not applicable  INTERVENTION:     -Continue 1 packet Juven BID, each packet provides 95 calories, 2.5 grams of protein (collagen), and 9.8 grams of carbohydrate (3 grams sugar); also contains 7 grams of L-arginine and L-glutamine, 300 mg vitamin C, 15 mg vitamin E, 1.2 mcg vitamin B-12, 9.5 mg zinc, 200 mg calcium, and 1.5 g  Calcium Beta-hydroxy-Beta-methylbutyrate to support wound healing -Continue Ensure Max po daily, each supplement provides 150 kcal and 30 grams of protein -Continue MVI with minerals daily   NUTRITION DIAGNOSIS:   Increased nutrient needs related to wound healing as evidenced by estimated needs.  Ongoing  GOAL:   Patient will meet greater than or equal to 90% of their needs  Progressing   MONITOR:   PO intake, Supplement acceptance, Labs, Weight trends, Skin, I & O's  REASON FOR ASSESSMENT:   Consult Wound healing  ASSESSMENT:   Tony Hicks is a 55 y.o. male with medical history significant of diabetes mellitus type II and polysubstance abuse(tobacco, alcohol, and cocaine) presents with complaints of left foot pain.  Reviewed I/O's: -390 ml x 24 hours and -387 ml since admission  UOP:1 .9 L x 24 hours  Spoke with pt at bedside, who was pleasant and in good spirits today. He report good appetite, consuming 100% fo breakfast. PTA he had a fair appetite and did not adhere top a consistent meal schedule. He shares that he has avoid foods such as pork and salt in his diet to help control his DM. Commonly consumed foods include baked chicken, fish, and salads. Due to lifestyle modifications, he estimates he has lost about 10 pounds over the past 2 months.   Pt expressed frustration about inability to work over the past 2 months secondary to having to use an orthopedic boot and crutches. He has been experiencing financial difficulties due to this and has been working with a Regulatory affairs officer. Social work consult  pending.   Discussed importance of good meal and supplement intake to promote healing. Pt eager to take Juven and Ensure Max ("I'll take whatever you want me to take if it will help me with my leg").   Medications reviewed and include folic acid, ativan, and thiamine.   Labs reviewed: Na: 133, CBGS: 90-108 (inpatient orders for glycemic control are 0-9 units insulin aspart TID with meals).    NUTRITION - FOCUSED PHYSICAL EXAM:  Flowsheet Row Most Recent Value  Orbital Region No depletion  Upper Arm Region No depletion  Thoracic and Lumbar Region No depletion  Buccal Region No depletion  Temple Region No depletion  Clavicle Bone Region No depletion  Clavicle and Acromion Bone Region No depletion  Scapular Bone Region No depletion  Dorsal Hand No depletion  Patellar Region Mild depletion  Anterior Thigh Region Mild depletion  Posterior Calf Region Mild depletion  Edema (RD Assessment) None  Hair Reviewed  Eyes Reviewed  Mouth Reviewed  Skin Reviewed  Nails Reviewed       Diet Order:   Diet Order             Diet NPO time specified  Diet effective midnight           Diet Carb Modified Fluid consistency: Thin; Room service appropriate? Yes  Diet effective now                   EDUCATION NEEDS:   No education needs have been identified at this time  Skin:  Skin Assessment: Skin Integrity Issues: Skin Integrity Issues:: Diabetic Ulcer Diabetic Ulcer: lt foot  Last BM:  01/08/21  Height:   Ht Readings from Last 1 Encounters:  12/21/20 5\' 6"  (1.676 m)    Weight:   Wt Readings from Last 1 Encounters:  01/09/21 83.9 kg    Ideal Body Weight:  64.5 kg  BMI:  Body mass index is 29.86 kg/m.  Estimated Nutritional Needs:   Kcal:  1950-2150  Protein:  100-115 grams  Fluid:  > 1.9 L    03/12/21, RD, LDN, CDCES Registered Dietitian II Certified Diabetes Care and Education Specialist Please refer to [facility] for RD and/or RD on-call/weekend/after hours  pager

## 2021-01-11 ENCOUNTER — Encounter (HOSPITAL_COMMUNITY): Payer: Self-pay

## 2021-01-11 ENCOUNTER — Inpatient Hospital Stay (HOSPITAL_COMMUNITY): Payer: Self-pay

## 2021-01-11 ENCOUNTER — Encounter (HOSPITAL_COMMUNITY)
Admission: EM | Disposition: A | Discharge status: Home / Self Care | Payer: Self-pay | Source: Home / Self Care | Attending: Internal Medicine

## 2021-01-11 DIAGNOSIS — I7092 Chronic total occlusion of artery of the extremities: Secondary | ICD-10-CM

## 2021-01-11 DIAGNOSIS — L97529 Non-pressure chronic ulcer of other part of left foot with unspecified severity: Secondary | ICD-10-CM

## 2021-01-11 DIAGNOSIS — I70245 Atherosclerosis of native arteries of left leg with ulceration of other part of foot: Secondary | ICD-10-CM

## 2021-01-11 HISTORY — PX: ABDOMINAL AORTOGRAM W/LOWER EXTREMITY: CATH118223

## 2021-01-11 LAB — CBC
HCT: 25.7 % — ABNORMAL LOW (ref 39.0–52.0)
HCT: 27.2 % — ABNORMAL LOW (ref 39.0–52.0)
Hemoglobin: 7.9 g/dL — ABNORMAL LOW (ref 13.0–17.0)
Hemoglobin: 8.1 g/dL — ABNORMAL LOW (ref 13.0–17.0)
MCH: 19.6 pg — ABNORMAL LOW (ref 26.0–34.0)
MCH: 19.1 pg — ABNORMAL LOW (ref 26.0–34.0)
MCHC: 30.7 g/dL (ref 30.0–36.0)
MCHC: 29.8 g/dL — ABNORMAL LOW (ref 30.0–36.0)
MCV: 63.8 fL — ABNORMAL LOW (ref 80.0–100.0)
MCV: 64.3 fL — ABNORMAL LOW (ref 80.0–100.0)
Platelets: 340 10*3/uL (ref 150–400)
Platelets: 352 10*3/uL (ref 150–400)
RBC: 4.03 MIL/uL — ABNORMAL LOW (ref 4.22–5.81)
RBC: 4.23 MIL/uL (ref 4.22–5.81)
RDW: 19.3 % — ABNORMAL HIGH (ref 11.5–15.5)
RDW: 19.9 % — ABNORMAL HIGH (ref 11.5–15.5)
WBC: 5.6 10*3/uL (ref 4.0–10.5)
WBC: 5.6 10*3/uL (ref 4.0–10.5)
nRBC: 0 % (ref 0.0–0.2)
nRBC: 0 % (ref 0.0–0.2)

## 2021-01-11 LAB — CREATININE, SERUM
Creatinine, Ser: 0.75 mg/dL (ref 0.61–1.24)
GFR, Estimated: >60 mL/min (ref >60)

## 2021-01-11 LAB — VANCOMYCIN, PEAK: Vancomycin Pk: 26 ug/mL — ABNORMAL LOW (ref 30–40)

## 2021-01-11 LAB — GLUCOSE, CAPILLARY
Glucose-Capillary: 102 mg/dL — ABNORMAL HIGH (ref 70–99)
Glucose-Capillary: 91 mg/dL (ref 70–99)
Glucose-Capillary: 81 mg/dL (ref 70–99)
Glucose-Capillary: 122 mg/dL — ABNORMAL HIGH (ref 70–99)

## 2021-01-11 SURGERY — ABDOMINAL AORTOGRAM W/LOWER EXTREMITY
Anesthesia: LOCAL

## 2021-01-11 MED ORDER — LIDOCAINE HCL (PF) 1 % IJ SOLN
INTRAMUSCULAR | Status: DC | PRN
  Administered 2021-01-11: 20 mL via INTRADERMAL

## 2021-01-11 MED ORDER — MORPHINE SULFATE (PF) 2 MG/ML IV SOLN
1.0000 mg | Freq: Once | INTRAVENOUS | Status: AC
  Administered 2021-01-11: 1 mg via INTRAVENOUS
  Filled 2021-01-11: qty 1

## 2021-01-11 MED ORDER — FENTANYL CITRATE (PF) 100 MCG/2ML IJ SOLN
INTRAMUSCULAR | Status: DC | PRN
  Administered 2021-01-11: 50 ug via INTRAVENOUS

## 2021-01-11 MED ORDER — FENTANYL CITRATE (PF) 100 MCG/2ML IJ SOLN
INTRAMUSCULAR | Status: AC
  Filled 2021-01-11: qty 2

## 2021-01-11 MED ORDER — LABETALOL HCL 5 MG/ML IV SOLN: 10.0000 mg | INTRAVENOUS | Status: DC | PRN

## 2021-01-11 MED ORDER — MIDAZOLAM HCL 2 MG/2ML IJ SOLN
INTRAMUSCULAR | Status: DC | PRN
  Administered 2021-01-11: 2 mg via INTRAVENOUS

## 2021-01-11 MED ORDER — ONDANSETRON HCL 4 MG/2ML IJ SOLN: 4.0000 mg | Freq: Four times a day (QID) | INTRAMUSCULAR | Status: DC | PRN

## 2021-01-11 MED ORDER — SODIUM CHLORIDE 0.9 % IV SOLN: 250.0000 mL | INTRAVENOUS | Status: DC | PRN

## 2021-01-11 MED ORDER — MIDAZOLAM HCL 2 MG/2ML IJ SOLN
INTRAMUSCULAR | Status: AC
  Filled 2021-01-11: qty 2

## 2021-01-11 MED ORDER — SODIUM CHLORIDE 0.9 % IV SOLN: INTRAVENOUS | Status: DC

## 2021-01-11 MED ORDER — SODIUM CHLORIDE 0.9% FLUSH
3.0000 mL | Freq: Two times a day (BID) | INTRAVENOUS | Status: DC
  Administered 2021-01-13 – 2021-01-19 (×10): 3 mL via INTRAVENOUS

## 2021-01-11 MED ORDER — LIDOCAINE IN D5W 4-5 MG/ML-% IV SOLN
INTRAVENOUS | Status: AC
  Filled 2021-01-11: qty 500

## 2021-01-11 MED ORDER — HYDRALAZINE HCL 20 MG/ML IJ SOLN: 5.0000 mg | INTRAMUSCULAR | Status: DC | PRN

## 2021-01-11 MED ORDER — SODIUM CHLORIDE 0.9% FLUSH: 3.0000 mL | INTRAVENOUS | Status: DC | PRN

## 2021-01-11 MED ORDER — IODIXANOL 320 MG/ML IV SOLN
INTRAVENOUS | Status: DC | PRN
  Administered 2021-01-11: 65 mL via INTRA_ARTERIAL

## 2021-01-11 MED ORDER — HEPARIN (PORCINE) IN NACL 1000-0.9 UT/500ML-% IV SOLN
INTRAVENOUS | Status: DC | PRN
  Administered 2021-01-11 (×2): 500 mL

## 2021-01-11 MED ORDER — SODIUM CHLORIDE 0.9 % WEIGHT BASED INFUSION: 1.0000 mL/kg/h | INTRAVENOUS | Status: AC

## 2021-01-11 SURGICAL SUPPLY — 11 items
CATH OMNI FLUSH 5F 65CM (CATHETERS) ×2 IMPLANT
DEVICE CLOSURE MYNXGRIP 5F (Vascular Products) ×2 IMPLANT
GUIDEWIRE ANGLED .035X150CM (WIRE) ×2 IMPLANT
KIT MICROPUNCTURE NIT STIFF (SHEATH) ×2 IMPLANT
KIT PV (KITS) ×2 IMPLANT
SHEATH PINNACLE 5F 10CM (SHEATH) ×2 IMPLANT
SHEATH PROBE COVER 6X72 (BAG) ×2 IMPLANT
SYR MEDRAD MARK V 150ML (SYRINGE) ×2 IMPLANT
TRANSDUCER W/STOPCOCK (MISCELLANEOUS) ×2 IMPLANT
TRAY PV CATH (CUSTOM PROCEDURE TRAY) ×2 IMPLANT
WIRE BENTSON .035X145CM (WIRE) ×2 IMPLANT

## 2021-01-11 NOTE — Op Note (Signed)
DATE OF SERVICE: 01/11/2021  PATIENT:  Tony Hicks  55 y.o. male  PRE-OPERATIVE DIAGNOSIS:  Atherosclerosis of native arteries of left lower extremity lower extremity causing ulceration  POST-OPERATIVE DIAGNOSIS:  Same  PROCEDURE:   1) US guided right common femoral artery access 2) Aortogram 3) left lower extremity angiogram with second order cannulation (65mL total contrast) 4) Conscious sedation (31 minutes)  SURGEON:  Surgeon(s) and Role:    * Leonie Douglas, MD - Primary  ASSISTANT: none  ANESTHESIA:   local and IV sedation  EBL: min  BLOOD ADMINISTERED:none  DRAINS: none   LOCAL MEDICATIONS USED:  LIDOCAINE   SPECIMEN:  none  COUNTS: confirmed correct.  TOURNIQUET:  none  PATIENT DISPOSITION:  PACU - hemodynamically stable.   Delay start of Pharmacological VTE agent (>24hrs) due to surgical blood loss or risk of bleeding: no  INDICATION FOR PROCEDURE: [name] is a 55 y.o. male with peripheral arterial disease and ischemic wounds of the left foot. After careful discussion of risks, benefits, and alternatives the patient was offered angiography. We specifically discussed access site complications. The patient understood and wished to proceed.  OPERATIVE FINDINGS:  Unremarkable aortogram and iliac angiograms  Left lower extremity CFA / PFA / SFA no flow limiting stenosis Popliteal artery occluded behind-the-knee Reconstitution of tibial vessels about the mid calf  PT is dominant and fills the foot  DESCRIPTION OF PROCEDURE: After identification of the patient in the pre-operative holding area, the patient was transferred to the operating room. The patient was positioned supine on the operating room table. Anesthesia was induced. The groins was prepped and draped in standard fashion. A surgical pause was performed confirming correct patient, procedure, and operative location.  The right groin was anesthetized with subcutaneous injection of 1% lidocaine.  Using ultrasound guidance, the right common femoral artery was accessed with micropuncture technique. Fluoroscopy was used to confirm cannulation over the femoral head. Sheathogram was not performed. The 13F sheath was upsized to 35F.   An 035 glidewire advantage was advanced into the distal aorta. Over the wire an omni flush catheter was advanced to the level of L2. Aortogram was performed - see above for details.   The left common iliac artery was selected with the 035 glidewire advantage. The wire was advanced into the common femoral artery. Over the wire the omni flush catheter was advanced into the external iliac artery. Selective angiography was performed - see above for details.   A mynx device was used to close the arteriotomy. Hemostasis was excellent upon completion.  Conscious sedation was administered with the use of IV fentanyl and midazolam under continuous physician and nurse monitoring.  Heart rate, blood pressure, and oxygen saturation were continuously monitored.  Total sedation time was 31 minutes  Upon completion of the case instrument and sharps counts were confirmed correct. The patient was transferred to the PACU in good condition. I was present for all portions of the procedure.  PLAN: Continue ASA / Statin indefinitely. Needs saphenous vein mapping. Cardiac risk stratification. Plan left above knee popliteal to posterior tibial artery bypass week of 01/14/21.  Rande Brunt. Lenell Antu, MD [facility] Phone Number: [phone] 01/11/2021 5:19 PM

## 2021-01-11 NOTE — Progress Notes (Signed)
I just attempted this patient's MRI for the second time. He was given a dose of pain medicine before coming down. I positioned the patient and went to put him in the scanner and he almost came off the table because of the pain. Pt then told me he was not going to be able to do the MRI in this state. He is willing to try again with a stronger pain med.

## 2021-01-11 NOTE — Progress Notes (Signed)
Pt transferred from cath lab, covid positive.  R groin CDI level 0.  No complaints of pain at this time.  Tele initiated and chg completed.

## 2021-01-11 NOTE — Progress Notes (Signed)
PROGRESS NOTE  Tony Hicks PQZ:[id number] DOB: Jun 22, 1966 DOA: 01/08/2021 PCP: Rema Fendt, NP   LOS: 3 days   Brief Narrative / Interim history: 55 year old male with DM2, polysubstance abuse (tobacco, alcohol, cocaine) who comes in with worsening left foot pain.  He has several ulcers there present for the last 2 months, seen by podiatry but overall without improvement.  Pain is gotten worse, ulcers have been getting larger and he has noticed some purulent discharges.  He eventually came to the ER and was admitted to the hospital.  Podiatry consulted.  Incidentally he is Covid 19 was positive  Subjective / 24h Interval events: Sleeping  Assessment & Plan: Principal Problem Sepsis secondary to diabetic foot ulcer with cellulitis-febrile in the ED, was tachycardic, had elevated lactic acid with likely source from his diabetic foot ulcer.  Podiatry consulted.   -ABI showed severe vascular disease, vascular surgery consulted and plan arteriogram of the lower extremity today. -MRI of the foot, he was unable to tolerate laying his foot against a hard surface, will change imaging to CT scan hopefully will be shorter and he will be able to go through with it -Continue broad-spectrum antibiotics -Podiatry following, likely needs surgery depends on vascular and CT findings  Active Problems COVID-19-remains asymptomatic and on room air  Type 2 diabetes mellitus, with hyperglycemia-A1c 6.2.  Patient was placed on sliding scale  Microcytic anemia, iron deficiency anemia-monitor, received IV iron, will place on oral iron on discharge  Hyponatremia-monitor, query related to EtOH  Polysubstance abuse-Patient admits to drinking significant amount of alcohol most days, smoking tobacco, and snorting cocaine recently. CIWA protocols initiated   PAD- Patient with decreased pulses noted in his lower extremities.  ABI showed severe PAD on the left and vascular surgery consulted.  Scheduled Meds:   aspirin EC  81 mg Oral Daily   atorvastatin  40 mg Oral Daily   enoxaparin (LOVENOX) injection  40 mg Subcutaneous Q24H   folic acid  1 mg Oral Daily   gabapentin  300 mg Oral BID   insulin aspart  0-9 Units Subcutaneous TID WC   LORazepam  0-4 mg Intravenous Q12H    morphine injection  1 mg Intravenous Once   multivitamin with minerals  1 tablet Oral Daily   nicotine  14 mg Transdermal Daily   nutrition supplement (JUVEN)  1 packet Oral BID BM   Ensure Max Protein  11 oz Oral QHS   sodium chloride flush  3 mL Intravenous Q12H   thiamine  100 mg Oral Daily   Or   thiamine  100 mg Intravenous Daily   Continuous Infusions:  sodium chloride 100 mL/hr at 01/11/21 0714   cefTRIAXone (ROCEPHIN)  IV 2 g (01/10/21 1338)   vancomycin 1,000 mg (01/11/21 0523)   PRN Meds:.acetaminophen, HYDROcodone-acetaminophen, hydrocortisone cream, LORazepam **OR** LORazepam  Diet Orders (From admission, onward)     Start     Ordered   01/11/21 0001  Diet NPO time specified  Diet effective midnight        01/10/21 1321            DVT prophylaxis: enoxaparin (LOVENOX) injection 40 mg Start: 01/08/21 1500     Code Status: Full Code  Family Communication: No family at bedside  Status is: Inpatient  Remains inpatient appropriate because:Inpatient level of care appropriate due to severity of illness  Dispo: The patient is from: Home              Anticipated d/c  is to: Home              Patient currently is not medically stable to d/c.   Difficult to place patient No  Level of care: Telemetry Medical  Consultants:  Podiatry  Vascular surgery  Procedures:  None   Microbiology  Blood cultures without growth  Antimicrobials: Ceftriaxone, vancomycin   Objective: Vitals:   01/10/21 1214 01/10/21 2102 01/11/21 0500 01/11/21 0826  BP: 107/90 122/80 124/68 109/85  Pulse: 88 87 86 87  Resp: 20 18  18   Temp: 98.9 F (37.2 C) 98.9 F (37.2 C)  98.6 F (37 C)  TempSrc: Oral Oral   Oral  SpO2: 100% 100%  98%  Weight:        Intake/Output Summary (Last 24 hours) at 01/11/2021 1120 Last data filed at 01/10/2021 1500 Gross per 24 hour  Intake 480 ml  Output 1200 ml  Net -720 ml    Filed Weights   01/09/21 1200  Weight: 83.9 kg    Examination:  Constitutional: No distress Eyes: No icterus ENMT: mmm Neck: normal, supple Respiratory: Clear bilaterally, no wheezing Cardiovascular: Regular rate and rhythm, no murmurs, no peripheral edema Abdomen: Soft, nontender, nondistended, bowel sounds positive Musculoskeletal: no clubbing / cyanosis.  Skin: No rashes seen Neurologic: Nonfocal, equal strength  Data Reviewed: I have independently reviewed following labs and imaging studies   CBC: Recent Labs  Lab 01/08/21 0935 01/10/21 0638 01/11/21 0721  WBC 7.8 5.4 5.6  NEUTROABS 6.3  --   --   HGB 8.2* 8.1* 7.9*  HCT 27.7* 26.6* 25.7*  MCV 65.2* 63.6* 63.8*  PLT 387 331 340    Basic Metabolic Panel: Recent Labs  Lab 01/08/21 0935 01/09/21 0359 01/10/21 0638  NA 131*  --  133*  K 3.6  --  4.1  CL 97*  --  100  CO2 24  --  26  GLUCOSE 175*  --  99  BUN 5*  --  <5*  CREATININE 0.85  --  0.87  CALCIUM 9.2  --  8.8*  MG  --  1.9  --   PHOS  --  4.9*  --     Liver Function Tests: Recent Labs  Lab 01/08/21 0935 01/10/21 0638  AST 21 20  ALT 15 15  ALKPHOS 54 44  BILITOT 0.8 0.6  PROT 7.3 6.7  ALBUMIN 3.2* 2.9*    Coagulation Profile: No results for input(s): INR, PROTIME in the last 168 hours. HbA1C: Recent Labs    01/08/21 1515  HGBA1C 6.2*    CBG: Recent Labs  Lab 01/10/21 0902 01/10/21 1209 01/10/21 1635 01/10/21 2119 01/11/21 0635  GLUCAP 90 108* 165* 89 102*     Recent Results (from the past 240 hour(s))  Culture, blood (routine x 2)     Status: None (Preliminary result)   Collection Time: 01/08/21 12:44 PM   Specimen: BLOOD  Result Value Ref Range Status   Specimen Description BLOOD RIGHT ANTECUBITAL  Final    Special Requests   Final    BOTTLES DRAWN AEROBIC AND ANAEROBIC Blood Culture results may not be optimal due to an inadequate volume of blood received in culture bottles   Culture   Final    NO GROWTH 3 DAYS Performed at [facility], [address], [place], [place]    Report Status PENDING  Incomplete  Culture, blood (routine x 2)     Status: None (Preliminary result)   Collection Time:  01/08/21 12:51 PM   Specimen: BLOOD  Result Value Ref Range Status   Specimen Description BLOOD RIGHT ANTECUBITAL  Final   Special Requests   Final    BOTTLES DRAWN AEROBIC AND ANAEROBIC Blood Culture results may not be optimal due to an inadequate volume of blood received in culture bottles   Culture   Final    NO GROWTH 3 DAYS Performed at [facility], [address] [place], [place] [address]    Report Status PENDING  Incomplete  SARS CORONAVIRUS 2 (TAT 6-24 HRS) Nasopharyngeal Nasopharyngeal Swab     Status: Abnormal   Collection Time: 01/08/21  3:26 PM   Specimen: Nasopharyngeal Swab  Result Value Ref Range Status   SARS Coronavirus 2 POSITIVE (A) NEGATIVE Final    Comment: (NOTE) SARS-CoV-2 target nucleic acids are DETECTED.  The SARS-CoV-2 RNA is generally detectable in upper and lower respiratory specimens during the acute phase of infection. Positive results are indicative of the presence of SARS-CoV-2 RNA. Clinical correlation with patient history and other diagnostic information is  necessary to determine patient infection status. Positive results do not rule out bacterial infection or co-infection with other viruses.  The expected result is Negative.  Fact Sheet for Patients: HairSlick.no  Fact Sheet for Healthcare Providers: quierodirigir.com  This test is not yet approved or cleared by the [place] FDA and  has been authorized for detection and/or diagnosis of SARS-CoV-2 by FDA under an  Emergency Use Authorization (EUA). This EUA will remain  in effect (meaning this test can be used) for the duration of the COVID-19 declaration under Section 564(b)(1) of the Act, 21 U. S.C. section 360bbb-3(b)(1), unless the authorization is terminated or revoked sooner.   Performed at [facility], [address] [place], [place] [address]       Radiology Studies: No results found.   Pamella Pert, MD, PhD [facility]  Between 7 am - 7 pm I am available, please contact me via Amion (for emergencies) or Securechat (non urgent messages)  Between 7 pm - 7 am I am not available, please contact night coverage MD/APP via Amion

## 2021-01-12 ENCOUNTER — Inpatient Hospital Stay (HOSPITAL_COMMUNITY): Payer: Self-pay

## 2021-01-12 ENCOUNTER — Encounter (HOSPITAL_COMMUNITY): Payer: Self-pay | Admitting: Internal Medicine

## 2021-01-12 DIAGNOSIS — L97509 Non-pressure chronic ulcer of other part of unspecified foot with unspecified severity: Secondary | ICD-10-CM

## 2021-01-12 DIAGNOSIS — Z01810 Encounter for preprocedural cardiovascular examination: Secondary | ICD-10-CM

## 2021-01-12 LAB — GLUCOSE, CAPILLARY
Glucose-Capillary: 91 mg/dL (ref 70–99)
Glucose-Capillary: 143 mg/dL — ABNORMAL HIGH (ref 70–99)
Glucose-Capillary: 132 mg/dL — ABNORMAL HIGH (ref 70–99)
Glucose-Capillary: 125 mg/dL — ABNORMAL HIGH (ref 70–99)
Glucose-Capillary: 147 mg/dL — ABNORMAL HIGH (ref 70–99)

## 2021-01-12 LAB — VANCOMYCIN, TROUGH: Vancomycin Tr: 5 ug/mL — ABNORMAL LOW (ref 15–20)

## 2021-01-12 MED ORDER — VANCOMYCIN HCL 1500 MG/300ML IV SOLN
1500.0000 mg | Freq: Two times a day (BID) | INTRAVENOUS | Status: DC
  Administered 2021-01-12 – 2021-01-14 (×4): 1500 mg via INTRAVENOUS
  Administered 2021-01-14: 1000 mg via INTRAVENOUS
  Administered 2021-01-15 – 2021-01-16 (×4): 1500 mg via INTRAVENOUS
  Filled 2021-01-12 (×10): qty 300

## 2021-01-12 NOTE — Progress Notes (Signed)
[facility] [place] PROGRESS NOTE  ASSESSMENT / PLAN: Tony Hicks is a 55 y.o. 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  Angiogram LLE shows popliteal and proximal tibial occlusion. Reconstitution of PT mid calf. Needs L above knee popliteal to PT bypass. Pending vein mapping. Dr. Chestine Spore may have time to do this Monday mid-day.   Continue best therapy for PAD: ASA / Statin. Following closely.     SUBJECTIVE: No complaints. Reviewed angiogram results.  OBJECTIVE: BP 113/73 (BP Location: Right Arm)   Pulse 70   Temp 98 F (36.7 C) (Oral)   Resp 15   Wt 83.9 kg   SpO2 100%   BMI 29.86 kg/m   Intake/Output Summary (Last 24 hours) at 01/12/2021 1102 Last data filed at 01/12/2021 0829 Gross per 24 hour  Intake 0 ml  Output 1645 ml  Net -1645 ml    No distress RRR Unlabored L groin soft  CBC Latest Ref Rng & Units 01/11/2021 01/11/2021 01/10/2021  WBC 4.0 - 10.5 K/uL 5.6 5.6 5.4  Hemoglobin 13.0 - 17.0 g/dL 8.1(L) 7.9(L) 8.1(L)  Hematocrit 39.0 - 52.0 % 27.2(L) 25.7(L) 26.6(L)  Platelets 150 - 400 K/uL 352 340 331     CMP Latest Ref Rng & Units 01/11/2021 01/10/2021 01/08/2021  Glucose 70 - 99 mg/dL - 99 295(M)  BUN 6 - 20 mg/dL - <8(U) 5(L)  Creatinine 0.61 - 1.24 mg/dL 1.32 4.40 1.02  Sodium 135 - 145 mmol/L - 133(L) 131(L)  Potassium 3.5 - 5.1 mmol/L - 4.1 3.6  Chloride 98 - 111 mmol/L - 100 97(L)  CO2 22 - 32 mmol/L - 26 24  Calcium 8.9 - 10.3 mg/dL - 8.8(L) 9.2  Total Protein 6.5 - 8.1 g/dL - 6.7 7.3  Total Bilirubin 0.3 - 1.2 mg/dL - 0.6 0.8  Alkaline Phos 38 - 126 U/L - 44 54  AST 15 - 41 U/L - 20 21  ALT 0 - 44 U/L - 15 15    Estimated Creatinine Clearance: 107.2 mL/min (by C-G formula based on SCr of 0.75 mg/dL).  Rande Brunt. Lenell Antu, MD [facility] Phone Number: [phone] 01/12/2021 11:02 AM

## 2021-01-12 NOTE — Progress Notes (Signed)
Subjective: 55 year old male presents to the emergency department for worsening ulcers, infection of his left foot.  He was admitted through identified.  Vascular to preform bypass.   Objective: AAO x3, NAD- sitting up in bed with friends present; no concerns today Nonpalpable pulses present.  Overall exam is unchanged . Ulcerations with eschar present to the posterior heel, posterior Achilles tendon. There is no fluctuation crepitation but there is edema to the foot with hyperpigmented changes to the foot on the left side.  Assessment: Left foot ulceration of the severe PAD  Plan: Plan for bypass possibly Monday with vascular surgery. CT negative for osteomyelitis. Will follow up after bypass for possible debridement of ulcers.  Ovid Curd, DPM

## 2021-01-12 NOTE — Progress Notes (Signed)
Pharmacy Antibiotic Note  Tony Hicks is a 55 y.o. male admitted on 01/08/2021 with Sepsis secondary to diabetic foot ulcer with cellulitis. Pharmacy has been consulted for Vancomycin dosing.  Assessment: Current AUC: 335 T1/2: 4.2 hours Ke: 0.1646 Afebrile, CBC normal, CrCl stable Current dosing is below target AUC; will adjust accordingly with predicted AUC of 501.4.  Plan: Vancomycin 1500 mg IV every 12 hours for predicted AUC of 501.4 Consider MRSA PCR  Height:  (per char 6/17) Weight: 83.9 kg (185 lb) Dosing weight: 84 kg Scr: 0.75  Temp (24hrs), Avg:98.3 F (36.8 C), Min:98.2 F (36.8 C), Max:98.6 F (37 C)  Recent Labs  Lab 01/08/21 0935 01/08/21 1244 01/08/21 1515 01/10/21 0638 01/11/21 0721 01/11/21 1840 01/11/21 2033 01/12/21 0634  WBC 7.8  --   --  5.4 5.6 5.6  --   --   CREATININE 0.85  --   --  0.87  --  0.75  --   --   LATICACIDVEN  --  3.0* 1.6  --   --   --   --   --   VANCOTROUGH  --   --   --   --   --   --   --  5*  VANCOPEAK  --   --   --   --   --   --  26*  --     Estimated Creatinine Clearance: 107.2 mL/min (by C-G formula based on SCr of 0.75 mg/dL).    No Known Allergies  Antimicrobials this admission: Ceftriaxone 7/5 >>  Vancomycin 7/7 >>   Dose adjustments this admission: 7/9: Vanc increased from 1g q 12 to 1500mg  q 12  Microbiology results: 7/5 BCx: NG x 3 days No MRSA PCR performed COVID + 7/5  Thank you for allowing pharmacy to be a part of this patient's care.  9/5 01/12/2021 7:55 AM

## 2021-01-12 NOTE — Progress Notes (Signed)
PROGRESS NOTE  Tony Hicks VZC:[id number] DOB: June 24, 1966 DOA: 01/08/2021 PCP: Rema Fendt, NP   LOS: 4 days   Brief Narrative / Interim history: 55 year old male with DM2, polysubstance abuse (tobacco, alcohol, cocaine) who comes in with worsening left foot pain.  He has several ulcers there present for the last 2 months, seen by podiatry but overall without improvement.  Pain is gotten worse, ulcers have been getting larger and he has noticed some purulent discharges.  He eventually came to the ER and was admitted to the hospital.  Podiatry consulted.  Incidentally he is Covid 19 was positive  Subjective / 24h Interval events: No complaints, other than intermittent foot pain feels well.  No cough, no chest congestion, no shortness of breath.  Assessment & Plan: Principal Problem Sepsis secondary to diabetic foot ulcer with cellulitis-febrile in the ED, was tachycardic, had elevated lactic acid with likely source from his diabetic foot ulcer.  Podiatry consulted.   -ABI showed severe vascular disease, vascular surgery consulted.  He is status post arteriogram on 7/8 with popliteal artery occlusion on the left lower extremity behind the knee, will need left above-knee popliteal to posterior tibial artery bypass on Monday 7/11 -CT scan of the foot with ulceration of the lateral aspect of the heel but no underlying abscess, osteomyelitis or anything else acute -Continue antibiotics for now  Active Problems COVID-19-remains asymptomatic today  Type 2 diabetes mellitus, with hyperglycemia-A1c 6.2.  Patient was placed on sliding scale.  CBGs controlled  CBG (last 3)  Recent Labs    01/11/21 1809 01/11/21 2114 01/12/21 0553  GLUCAP 81 122* 91   Microcytic anemia, iron deficiency anemia-monitor, received IV iron, will place on oral iron on discharge  Hyponatremia-monitor, query related to EtOH  Polysubstance abuse-Patient admits to drinking significant amount of alcohol most days,  smoking tobacco, and snorting cocaine recently. CIWA protocols initiated, does not appear to be withdrawing   PAD- Patient with decreased pulses noted in his lower extremities.  Plans per vascular as above  Scheduled Meds:  aspirin EC  81 mg Oral Daily   atorvastatin  40 mg Oral Daily   enoxaparin (LOVENOX) injection  40 mg Subcutaneous Q24H   folic acid  1 mg Oral Daily   gabapentin  300 mg Oral BID   insulin aspart  0-9 Units Subcutaneous TID WC   LORazepam  0-4 mg Intravenous Q12H   multivitamin with minerals  1 tablet Oral Daily   nicotine  14 mg Transdermal Daily   nutrition supplement (JUVEN)  1 packet Oral BID BM   Ensure Max Protein  11 oz Oral QHS   sodium chloride flush  3 mL Intravenous Q12H   thiamine  100 mg Oral Daily   Or   thiamine  100 mg Intravenous Daily   Continuous Infusions:  sodium chloride     cefTRIAXone (ROCEPHIN)  IV 2 g (01/10/21 1338)   vancomycin     PRN Meds:.sodium chloride, acetaminophen, hydrALAZINE, HYDROcodone-acetaminophen, hydrocortisone cream, labetalol, ondansetron (ZOFRAN) IV, sodium chloride flush  Diet Orders (From admission, onward)     Start     Ordered   01/11/21 1804  Diet Carb Modified Fluid consistency: Thin; Room service appropriate? Yes  Diet effective now       Question Answer Comment  Diet-HS Snack? Nothing   Calorie Level Medium 1600-2000   Fluid consistency: Thin   Room service appropriate? Yes      01/11/21 1803  DVT prophylaxis: SCD's Start: 01/11/21 1804 enoxaparin (LOVENOX) injection 40 mg Start: 01/08/21 1500     Code Status: Full Code  Family Communication: No family at bedside  Status is: Inpatient  Remains inpatient appropriate because:Inpatient level of care appropriate due to severity of illness  Dispo: The patient is from: Home              Anticipated d/c is to: Home              Patient currently is not medically stable to d/c.   Difficult to place patient No  Level of care:  Progressive  Consultants:  Podiatry  Vascular surgery  Procedures:  None   Microbiology  Blood cultures without growth  Antimicrobials: Ceftriaxone, vancomycin   Objective: Vitals:   01/11/21 1941 01/11/21 2334 01/12/21 0329 01/12/21 0824  BP: (!) 118/59 103/60 113/73   Pulse: 91 78 70   Resp: 20 20 17 15   Temp: 98.3 F (36.8 C) 98.2 F (36.8 C) 98.2 F (36.8 C) 98 F (36.7 C)  TempSrc: Oral Oral Oral Oral  SpO2: 100% 99% 100%   Weight:        Intake/Output Summary (Last 24 hours) at 01/12/2021 1043 Last data filed at 01/12/2021 03/15/2021 Gross per 24 hour  Intake 0 ml  Output 1645 ml  Net -1645 ml    Filed Weights   01/09/21 1200  Weight: 83.9 kg    Examination:  Constitutional: NAD, in bed Eyes: No icterus ENMT: mmm Neck: normal, supple Respiratory: Clear bilaterally, no wheezing Cardiovascular: Regular rate and rhythm no murmurs Abdomen: Soft, NT, ND, bowel sounds positive Musculoskeletal: no clubbing / cyanosis.  Skin: No rashes Neurologic: No focal deficits  Data Reviewed: I have independently reviewed following labs and imaging studies   CBC: Recent Labs  Lab 01/08/21 0935 01/10/21 0638 01/11/21 0721 01/11/21 1840  WBC 7.8 5.4 5.6 5.6  NEUTROABS 6.3  --   --   --   HGB 8.2* 8.1* 7.9* 8.1*  HCT 27.7* 26.6* 25.7* 27.2*  MCV 65.2* 63.6* 63.8* 64.3*  PLT 387 331 340 352    Basic Metabolic Panel: Recent Labs  Lab 01/08/21 0935 01/09/21 0359 01/10/21 0638 01/11/21 1840  NA 131*  --  133*  --   K 3.6  --  4.1  --   CL 97*  --  100  --   CO2 24  --  26  --   GLUCOSE 175*  --  99  --   BUN 5*  --  <5*  --   CREATININE 0.85  --  0.87 0.75  CALCIUM 9.2  --  8.8*  --   MG  --  1.9  --   --   PHOS  --  4.9*  --   --     Liver Function Tests: Recent Labs  Lab 01/08/21 0935 01/10/21 0638  AST 21 20  ALT 15 15  ALKPHOS 54 44  BILITOT 0.8 0.6  PROT 7.3 6.7  ALBUMIN 3.2* 2.9*    Coagulation Profile: No results for input(s): INR,  PROTIME in the last 168 hours. HbA1C: No results for input(s): HGBA1C in the last 72 hours.  CBG: Recent Labs  Lab 01/11/21 0635 01/11/21 1140 01/11/21 1809 01/11/21 2114 01/12/21 0553  GLUCAP 102* 91 81 122* 91     Recent Results (from the past 240 hour(s))  Culture, blood (routine x 2)     Status: None (Preliminary result)   Collection Time:  01/08/21 12:44 PM   Specimen: BLOOD  Result Value Ref Range Status   Specimen Description BLOOD RIGHT ANTECUBITAL  Final   Special Requests   Final    BOTTLES DRAWN AEROBIC AND ANAEROBIC Blood Culture results may not be optimal due to an inadequate volume of blood received in culture bottles   Culture   Final    NO GROWTH 4 DAYS Performed at [facility], [address] [place], [place] [address]    Report Status PENDING  Incomplete  Culture, blood (routine x 2)     Status: None (Preliminary result)   Collection Time: 01/08/21 12:51 PM   Specimen: BLOOD  Result Value Ref Range Status   Specimen Description BLOOD RIGHT ANTECUBITAL  Final   Special Requests   Final    BOTTLES DRAWN AEROBIC AND ANAEROBIC Blood Culture results may not be optimal due to an inadequate volume of blood received in culture bottles   Culture   Final    NO GROWTH 4 DAYS Performed at [facility], [address] [place], [place] [address]    Report Status PENDING  Incomplete  SARS CORONAVIRUS 2 (TAT 6-24 HRS) Nasopharyngeal Nasopharyngeal Swab     Status: Abnormal   Collection Time: 01/08/21  3:26 PM   Specimen: Nasopharyngeal Swab  Result Value Ref Range Status   SARS Coronavirus 2 POSITIVE (A) NEGATIVE Final    Comment: (NOTE) SARS-CoV-2 target nucleic acids are DETECTED.  The SARS-CoV-2 RNA is generally detectable in upper and lower respiratory specimens during the acute phase of infection. Positive results are indicative of the presence of SARS-CoV-2 RNA. Clinical correlation with patient history and other diagnostic information is   necessary to determine patient infection status. Positive results do not rule out bacterial infection or co-infection with other viruses.  The expected result is Negative.  Fact Sheet for Patients: HairSlick.nohttps://www.fda.gov/media/138098/download  Fact Sheet for Healthcare Providers: quierodirigir.comhttps://www.fda.gov/media/138095/download  This test is not yet approved or cleared by the [place] FDA and  has been authorized for detection and/or diagnosis of SARS-CoV-2 by FDA under an Emergency Use Authorization (EUA). This EUA will remain  in effect (meaning this test can be used) for the duration of the COVID-19 declaration under Section 564(b)(1) of the Act, 21 U. S.C. section 360bbb-3(b)(1), unless the authorization is terminated or revoked sooner.   Performed at [facility], [address] [place], [place] [address]       Radiology Studies: CT ANKLE LEFT W CONTRAST  Result Date: 01/11/2021 CLINICAL DATA:  Diabetic patient with a skin ulceration on the left foot. EXAM: CT OF THE LEFT ANKLE AND FOOT WITH CONTRAST TECHNIQUE: Multidetector CT imaging of the left ankle and foot was performed following the standard protocol during bolus administration of intravenous contrast. CONTRAST:  100 mL Omnipaque 300. COMPARISON:  None. FINDINGS: Bones/Joint/Cartilage No bony destructive change or periosteal reaction. No fracture or dislocation. No evidence of arthropathy. No joint effusion. Ligaments Suboptimally assessed by CT. Muscles and Tendons No intramuscular fluid collection or gas.  Appear intact. Soft tissues Skin ulceration is seen on the lateral aspect of the heel. No underlying abscess. No mass. IMPRESSION: Skin ulceration on the lateral aspect of the heel without underlying abscess. No evidence of osteomyelitis or other acute abnormality. Electronically Signed   By: Drusilla Kannerhomas  Dalessio M.D.   On: 01/11/2021 14:31   CT FOOT LEFT W CONTRAST  Result Date: 01/11/2021 CLINICAL DATA:  Diabetic  patient with a skin ulceration on  the left foot. EXAM: CT OF THE LEFT ANKLE AND FOOT WITH CONTRAST TECHNIQUE: Multidetector CT imaging of the left ankle and foot was performed following the standard protocol during bolus administration of intravenous contrast. CONTRAST:  100 mL Omnipaque 300. COMPARISON:  None. FINDINGS: Bones/Joint/Cartilage No bony destructive change or periosteal reaction. No fracture or dislocation. No evidence of arthropathy. No joint effusion. Ligaments Suboptimally assessed by CT. Muscles and Tendons No intramuscular fluid collection or gas.  Appear intact. Soft tissues Skin ulceration is seen on the lateral aspect of the heel. No underlying abscess. No mass. IMPRESSION: Skin ulceration on the lateral aspect of the heel without underlying abscess. No evidence of osteomyelitis or other acute abnormality. Electronically Signed   By: Drusilla Kanner M.D.   On: 01/11/2021 14:31     Pamella Pert, MD, PhD [facility]  Between 7 am - 7 pm I am available, please contact me via Amion (for emergencies) or Securechat (non urgent messages)  Between 7 pm - 7 am I am not available, please contact night coverage MD/APP via Amion

## 2021-01-13 LAB — CULTURE, BLOOD (ROUTINE X 2)
Culture: NO GROWTH
Culture: NO GROWTH

## 2021-01-13 LAB — GLUCOSE, CAPILLARY
Glucose-Capillary: 98 mg/dL (ref 70–99)
Glucose-Capillary: 121 mg/dL — ABNORMAL HIGH (ref 70–99)
Glucose-Capillary: 143 mg/dL — ABNORMAL HIGH (ref 70–99)
Glucose-Capillary: 118 mg/dL — ABNORMAL HIGH (ref 70–99)

## 2021-01-13 NOTE — Progress Notes (Signed)
Reviewed operative plan with patient.  We will plan for left popliteal to posterior tibial artery bypass tomorrow with Dr. Chestine Spore.  May use greater saphenous vein if it appears adequate.  N.p.o. after midnight.  Rande Brunt. Lenell Antu, MD [facility] Phone Number: [phone] 01/13/2021 10:21 AM

## 2021-01-13 NOTE — Progress Notes (Signed)
PROGRESS NOTE  Tony Hicks Tony Hicks:[id number]:[id number] DOB: 03-27-1966 DOA: 01/08/2021 PCP: [name], Amy J, NP   LOS: 5 days   Brief Narrative / Interim history: 55 year old male with DM2, polysubstance abuse (tobacco, alcohol, cocaine) who comes in with worsening left foot pain.  He has several ulcers there present for the last 2 months, seen by podiatry but overall without improvement.  Pain is gotten worse, ulcers have been getting larger and he has noticed some purulent discharges.  He eventually came to the ER and was admitted to the hospital.  Podiatry consulted.  Incidentally he is Covid 19 was positive  Subjective / 24h Interval events: Left foot is swollen this morning.  Has mild pain.  Assessment & Plan: Principal Problem Sepsis secondary to diabetic foot ulcer with cellulitis-febrile in the ED, was tachycardic, had elevated lactic acid with likely source from his diabetic foot ulcer.  Podiatry consulted.   -ABI showed severe vascular disease, vascular surgery consulted.  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  Needs 10 days of isolation  Type 2 diabetes mellitus, with hyperglycemia-A1c 6.2.  Patient was placed on sliding scale.  CBGs controlled today  CBG (last 3)  Recent Labs    01/12/21 1553 01/12/21 2004 01/13/21 0626  GLUCAP 125* 147* 98   Microcytic anemia, iron deficiency anemia-monitor, received IV iron, will place on oral iron on discharge  Hyponatremia-monitor, query related to EtOH  Polysubstance abuse-Patient admits to drinking significant amount of alcohol most days,  smoking tobacco, and snorting cocaine recently. CIWA protocols initiated, does not appear to be withdrawing   PAD- Patient with decreased pulses noted in his lower extremities.  Plans per vascular as above  Scheduled Meds:  aspirin EC  81 mg Oral Daily   atorvastatin  40 mg Oral Daily   enoxaparin (LOVENOX) injection  40 mg Subcutaneous Q24H   folic acid  1 mg Oral Daily   gabapentin  300 mg Oral BID   insulin aspart  0-9 Units Subcutaneous TID WC   multivitamin with minerals  1 tablet Oral Daily   nicotine  14 mg Transdermal Daily   nutrition supplement (JUVEN)  1 packet Oral BID BM   Ensure Max Protein  11 oz Oral QHS   sodium chloride flush  3 mL Intravenous Q12H   thiamine  100 mg Oral Daily   Or   thiamine  100 mg Intravenous Daily   Continuous Infusions:  sodium chloride     cefTRIAXone (ROCEPHIN)  IV 2 g (01/12/21 1543)   vancomycin 1,500 mg (01/13/21 0451)   PRN Meds:.sodium chloride, acetaminophen, hydrALAZINE, HYDROcodone-acetaminophen, hydrocortisone cream, labetalol, ondansetron (ZOFRAN) IV, sodium chloride flush  Diet Orders (From admission, onward)     Start     Ordered   01/11/21 1804  Diet Carb Modified Fluid consistency: Thin; Room service appropriate? Yes  Diet effective now       Question Answer Comment  Diet-HS Snack? Nothing   Calorie Level Medium 1600-2000   Fluid consistency: Thin   Room service appropriate? Yes      01/11/21 1803            DVT  prophylaxis: SCD's Start: 01/11/21 1804 enoxaparin (LOVENOX) injection 40 mg Start: 01/08/21 1500     Code Status: Full Code  Family Communication: No family at bedside  Status is: Inpatient  Remains inpatient appropriate because:Inpatient level of care appropriate due to severity of illness  Dispo: The patient is from: Home              Anticipated d/c is to: Home              Patient currently is not medically stable to d/c.   Difficult to place patient No  Level of care:  Progressive  Consultants:  Podiatry  Vascular surgery  Procedures:  None   Microbiology  Blood cultures without growth  Antimicrobials: Ceftriaxone, vancomycin   Objective: Vitals:   01/12/21 2008 01/12/21 2342 01/13/21 0400 01/13/21 0917  BP: 132/76 114/76 121/78 119/77  Pulse: 84 77 74 76  Resp: (!) 22 (!) 22 13 18   Temp: 98.2 F (36.8 C) 98.4 F (36.9 C) 98 F (36.7 C) 98 F (36.7 C)  TempSrc: Oral Oral Oral Oral  SpO2: 98% 100% 100% 100%  Weight:      Height:        Intake/Output Summary (Last 24 hours) at 01/13/2021 1004 Last data filed at 01/13/2021 03/16/2021 Gross per 24 hour  Intake 618 ml  Output 1100 ml  Net -482 ml    Filed Weights   01/09/21 1200 01/12/21 1554  Weight: 83.9 kg 89.3 kg    Examination:  Constitutional: No distress, in bed Eyes: No scleral icterus ENMT: mmm Neck: normal, supple Respiratory: Clear bilaterally, no wheezing Cardiovascular: Regular rate and rhythm, no murmurs Abdomen: Nondistended, bowel sounds positive Musculoskeletal: no clubbing / cyanosis.  Skin: No rashes seen Neurologic: Nonfocal  Data Reviewed: I have independently reviewed following labs and imaging studies   CBC: Recent Labs  Lab 01/08/21 0935 01/10/21 0638 01/11/21 0721 01/11/21 1840  WBC 7.8 5.4 5.6 5.6  NEUTROABS 6.3  --   --   --   HGB 8.2* 8.1* 7.9* 8.1*  HCT 27.7* 26.6* 25.7* 27.2*  MCV 65.2* 63.6* 63.8* 64.3*  PLT 387 331 340 352    Basic Metabolic Panel: Recent Labs  Lab 01/08/21 0935 01/09/21 0359 01/10/21 0638 01/11/21 1840  NA 131*  --  133*  --   K 3.6  --  4.1  --   CL 97*  --  100  --   CO2 24  --  26  --   GLUCOSE 175*  --  99  --   BUN 5*  --  <5*  --   CREATININE 0.85  --  0.87 0.75  CALCIUM 9.2  --  8.8*  --   MG  --  1.9  --   --   PHOS  --  4.9*  --   --     Liver Function Tests: Recent Labs  Lab 01/08/21 0935 01/10/21 0638  AST 21 20  ALT 15 15  ALKPHOS 54 44  BILITOT 0.8 0.6  PROT 7.3 6.7  ALBUMIN  3.2* 2.9*    Coagulation Profile: No results for input(s): INR, PROTIME in the last 168 hours. HbA1C: No results for input(s): HGBA1C in the last 72 hours.  CBG: Recent Labs  Lab 01/12/21 1214 01/12/21 1324 01/12/21 1553 01/12/21 2004 01/13/21 0626  GLUCAP 143* 132* 125* 147* 98     Recent Results (from the past 240 hour(s))  Culture, blood (routine x 2)  Status: None   Collection Time: 01/08/21 12:44 PM   Specimen: BLOOD  Result Value Ref Range Status   Specimen Description BLOOD RIGHT ANTECUBITAL  Final   Special Requests   Final    BOTTLES DRAWN AEROBIC AND ANAEROBIC Blood Culture results may not be optimal due to an inadequate volume of blood received in culture bottles   Culture   Final    NO GROWTH 5 DAYS Performed at [facility], [address], [place], [place] [address]    Report Status 01/13/2021 FINAL  Final  Culture, blood (routine x 2)     Status: None   Collection Time: 01/08/21 12:51 PM   Specimen: BLOOD  Result Value Ref Range Status   Specimen Description BLOOD RIGHT ANTECUBITAL  Final   Special Requests   Final    BOTTLES DRAWN AEROBIC AND ANAEROBIC Blood Culture results may not be optimal due to an inadequate volume of blood received in culture bottles   Culture   Final    NO GROWTH 5 DAYS Performed at [facility], [address] [place], [place] [address]    Report Status 01/13/2021 FINAL  Final  SARS CORONAVIRUS 2 (TAT 6-24 HRS) Nasopharyngeal Nasopharyngeal Swab     Status: Abnormal   Collection Time: 01/08/21  3:26 PM   Specimen: Nasopharyngeal Swab  Result Value Ref Range Status   SARS Coronavirus 2 POSITIVE (A) NEGATIVE Final    Comment: (NOTE) SARS-CoV-2 target nucleic acids are DETECTED.  The SARS-CoV-2 RNA is generally detectable in upper and lower respiratory specimens during the acute phase of infection. Positive results are indicative of the presence of SARS-CoV-2 RNA. Clinical correlation with patient history  and other diagnostic information is  necessary to determine patient infection status. Positive results do not rule out bacterial infection or co-infection with other viruses.  The expected result is Negative.  Fact Sheet for Patients: HairSlick.no  Fact Sheet for Healthcare Providers: quierodirigir.com  This test is not yet approved or cleared by the [place] FDA and  has been authorized for detection and/or diagnosis of SARS-CoV-2 by FDA under an Emergency Use Authorization (EUA). This EUA will remain  in effect (meaning this test can be used) for the duration of the COVID-19 declaration under Section 564(b)(1) of the Act, 21 U. S.C. section 360bbb-3(b)(1), unless the authorization is terminated or revoked sooner.   Performed at [facility], [address], [place], [place] [address]       Radiology Studies: VAS [place] LOWER EXTREMITY SAPHENOUS VEIN MAPPING  Result Date: 01/12/2021 LOWER EXTREMITY VEIN MAPPING Patient Name:  STEFFON GLADU  Date of Exam:   01/12/2021 Medical Rec #: [id number]     Accession #:    [id number] Date of Birth: Jan 21, 1966     Patient Gender: M Patient Age:   55Y Exam Location:  [facility] Procedure:      VAS [place] LOWER EXTREMITY SAPHENOUS VEIN MAPPING Referring Phys: [id number] Leonie Douglas --------------------------------------------------------------------------------  Indications:       Pre-op Other Indications: Rest pain of left lower extremity. Ulcerations. Risk Factors:      PAD.  Comparison Study: No prior study Performing Technologist: Sherren Kerns RVS  Examination Guidelines: A complete evaluation includes B-mode imaging, spectral Doppler, color Doppler, and power Doppler as needed of all accessible portions of each vessel. Bilateral testing is considered an integral part of a complete examination. Limited examinations for reoccurring indications may be performed as noted.  +---------------+-----------+----------------------+---------------+-----------+  RT Diameter  RT Findings         GSV            LT Diameter  LT Findings      (cm)                                            (cm)                  +---------------+-----------+----------------------+---------------+-----------+      0.44                     Saphenofemoral         0.29                                                   Junction                                  +---------------+-----------+----------------------+---------------+-----------+      0.34                     Proximal thigh         0.22                  +---------------+-----------+----------------------+---------------+-----------+      2.32       branching       Mid thigh            0.22                  +---------------+-----------+----------------------+---------------+-----------+      0.32                      Distal thigh          0.26                  +---------------+-----------+----------------------+---------------+-----------+      0.32                          Knee              0.30                  +---------------+-----------+----------------------+---------------+-----------+      0.24                       Prox calf            0.30       branching  +---------------+-----------+----------------------+---------------+-----------+      0.22                        Mid calf            0.33                  +---------------+-----------+----------------------+---------------+-----------+      0.29                      Distal calf           0.21                  +---------------+-----------+----------------------+---------------+-----------+  0.24                         Ankle              0.29                  +---------------+-----------+----------------------+---------------+-----------+    Preliminary      Pamella Pert, MD, PhD [facility]  Between 7 am - 7  pm I am available, please contact me via Amion (for emergencies) or Securechat (non urgent messages)  Between 7 pm - 7 am I am not available, please contact night coverage MD/APP via Amion

## 2021-01-14 ENCOUNTER — Encounter (HOSPITAL_COMMUNITY)
Admission: EM | Disposition: A | Discharge status: Home / Self Care | Payer: Self-pay | Source: Home / Self Care | Attending: Internal Medicine

## 2021-01-14 ENCOUNTER — Inpatient Hospital Stay (HOSPITAL_COMMUNITY): Payer: Self-pay

## 2021-01-14 ENCOUNTER — Encounter (HOSPITAL_COMMUNITY): Payer: Self-pay | Admitting: Vascular Surgery

## 2021-01-14 DIAGNOSIS — L97529 Non-pressure chronic ulcer of other part of left foot with unspecified severity: Secondary | ICD-10-CM

## 2021-01-14 DIAGNOSIS — I70245 Atherosclerosis of native arteries of left leg with ulceration of other part of foot: Secondary | ICD-10-CM

## 2021-01-14 DIAGNOSIS — I70244 Atherosclerosis of native arteries of left leg with ulceration of heel and midfoot: Secondary | ICD-10-CM

## 2021-01-14 DIAGNOSIS — L97429 Non-pressure chronic ulcer of left heel and midfoot with unspecified severity: Secondary | ICD-10-CM

## 2021-01-14 HISTORY — PX: THROMBECTOMY OF BYPASS GRAFT FEMORAL- TIBIAL ARTERY: SHX6904

## 2021-01-14 LAB — GLUCOSE, CAPILLARY
Glucose-Capillary: 119 mg/dL — ABNORMAL HIGH (ref 70–99)
Glucose-Capillary: 114 mg/dL — ABNORMAL HIGH (ref 70–99)
Glucose-Capillary: 113 mg/dL — ABNORMAL HIGH (ref 70–99)
Glucose-Capillary: 136 mg/dL — ABNORMAL HIGH (ref 70–99)
Glucose-Capillary: 230 mg/dL — ABNORMAL HIGH (ref 70–99)

## 2021-01-14 LAB — CBC
HCT: 26.1 % — ABNORMAL LOW (ref 39.0–52.0)
Hemoglobin: 7.9 g/dL — ABNORMAL LOW (ref 13.0–17.0)
MCH: 20.1 pg — ABNORMAL LOW (ref 26.0–34.0)
MCHC: 30.3 g/dL (ref 30.0–36.0)
MCV: 66.2 fL — ABNORMAL LOW (ref 80.0–100.0)
Platelets: 351 10*3/uL (ref 150–400)
RBC: 3.94 MIL/uL — ABNORMAL LOW (ref 4.22–5.81)
RDW: 21.9 % — ABNORMAL HIGH (ref 11.5–15.5)
WBC: 6.8 10*3/uL (ref 4.0–10.5)
nRBC: 0 % (ref 0.0–0.2)

## 2021-01-14 LAB — BASIC METABOLIC PANEL
Anion gap: 7 (ref 5–15)
BUN: 12 mg/dL (ref 6–20)
CO2: 25 mmol/L (ref 22–32)
Calcium: 9.1 mg/dL (ref 8.9–10.3)
Chloride: 102 mmol/L (ref 98–111)
Creatinine, Ser: 0.71 mg/dL (ref 0.61–1.24)
GFR, Estimated: >60 mL/min (ref >60)
Glucose, Bld: 93 mg/dL (ref 70–99)
Potassium: 4.1 mmol/L (ref 3.5–5.1)
Sodium: 134 mmol/L — ABNORMAL LOW (ref 135–145)

## 2021-01-14 LAB — TYPE AND SCREEN
ABO/RH(D): O POS
Antibody Screen: NEGATIVE

## 2021-01-14 LAB — ABO/RH: ABO/RH(D): O POS

## 2021-01-14 SURGERY — THROMBECTOMY, BYPASS GRAFT, ARTERIAL, FEMORAL TO TIBIAL
Anesthesia: General | Site: Leg Lower | Laterality: Left

## 2021-01-14 MED ORDER — DOCUSATE SODIUM 100 MG PO CAPS
100.0000 mg | ORAL_CAPSULE | Freq: Every day | ORAL | Status: DC
  Administered 2021-01-15 – 2021-01-19 (×5): 100 mg via ORAL
  Filled 2021-01-14 (×5): qty 1

## 2021-01-14 MED ORDER — SUGAMMADEX SODIUM 200 MG/2ML IV SOLN
INTRAVENOUS | Status: DC | PRN
  Administered 2021-01-14: 200 mg via INTRAVENOUS

## 2021-01-14 MED ORDER — PHENYLEPHRINE 40 MCG/ML (10ML) SYRINGE FOR IV PUSH (FOR BLOOD PRESSURE SUPPORT)
PREFILLED_SYRINGE | INTRAVENOUS | Status: AC
  Filled 2021-01-14: qty 10

## 2021-01-14 MED ORDER — POTASSIUM CHLORIDE CRYS ER 20 MEQ PO TBCR: 20.0000 meq | EXTENDED_RELEASE_TABLET | Freq: Every day | ORAL | Status: DC | PRN

## 2021-01-14 MED ORDER — SUCCINYLCHOLINE CHLORIDE 200 MG/10ML IV SOSY
PREFILLED_SYRINGE | INTRAVENOUS | Status: DC | PRN
  Administered 2021-01-14: 140 mg via INTRAVENOUS

## 2021-01-14 MED ORDER — DEXAMETHASONE SODIUM PHOSPHATE 10 MG/ML IJ SOLN
INTRAMUSCULAR | Status: DC | PRN
  Administered 2021-01-14: 10 mg via INTRAVENOUS

## 2021-01-14 MED ORDER — ROCURONIUM BROMIDE 10 MG/ML (PF) SYRINGE
PREFILLED_SYRINGE | INTRAVENOUS | Status: DC | PRN
  Administered 2021-01-14 (×3): 50 mg via INTRAVENOUS

## 2021-01-14 MED ORDER — MIDAZOLAM HCL 2 MG/2ML IJ SOLN
INTRAMUSCULAR | Status: AC
  Filled 2021-01-14: qty 2

## 2021-01-14 MED ORDER — FENTANYL CITRATE (PF) 250 MCG/5ML IJ SOLN
INTRAMUSCULAR | Status: DC | PRN
  Administered 2021-01-14: 50 ug via INTRAVENOUS
  Administered 2021-01-14 (×2): 100 ug via INTRAVENOUS
  Administered 2021-01-14: 150 ug via INTRAVENOUS

## 2021-01-14 MED ORDER — PHENYLEPHRINE HCL-NACL 10-0.9 MG/250ML-% IV SOLN
INTRAVENOUS | Status: DC | PRN
  Administered 2021-01-14: 30 ug/min via INTRAVENOUS

## 2021-01-14 MED ORDER — LIVING WELL WITH DIABETES BOOK
Freq: Once | Status: DC
  Filled 2021-01-14: qty 1

## 2021-01-14 MED ORDER — HEPARIN SODIUM (PORCINE) 1000 UNIT/ML IJ SOLN
INTRAMUSCULAR | Status: AC
  Filled 2021-01-14: qty 1

## 2021-01-14 MED ORDER — PROPOFOL 10 MG/ML IV BOLUS
INTRAVENOUS | Status: AC
  Filled 2021-01-14: qty 20

## 2021-01-14 MED ORDER — PHENOL 1.4 % MT LIQD: 1.0000 | OROMUCOSAL | Status: DC | PRN

## 2021-01-14 MED ORDER — ONDANSETRON HCL 4 MG/2ML IJ SOLN
INTRAMUSCULAR | Status: DC | PRN
  Administered 2021-01-14: 4 mg via INTRAVENOUS

## 2021-01-14 MED ORDER — DEXMEDETOMIDINE (PRECEDEX) IN NS 20 MCG/5ML (4 MCG/ML) IV SYRINGE
PREFILLED_SYRINGE | INTRAVENOUS | Status: AC
  Filled 2021-01-14: qty 5

## 2021-01-14 MED ORDER — FENTANYL CITRATE (PF) 250 MCG/5ML IJ SOLN
INTRAMUSCULAR | Status: AC
  Filled 2021-01-14: qty 5

## 2021-01-14 MED ORDER — ACETAMINOPHEN 650 MG RE SUPP: 325.0000 mg | RECTAL | Status: DC | PRN

## 2021-01-14 MED ORDER — MIDAZOLAM HCL 2 MG/2ML IJ SOLN
INTRAMUSCULAR | Status: DC | PRN
  Administered 2021-01-14: 2 mg via INTRAVENOUS

## 2021-01-14 MED ORDER — HEPARIN SODIUM (PORCINE) 1000 UNIT/ML IJ SOLN
INTRAMUSCULAR | Status: AC
  Filled 2021-01-14: qty 2

## 2021-01-14 MED ORDER — ALUM & MAG HYDROXIDE-SIMETH 200-200-20 MG/5ML PO SUSP: 15.0000 mL | ORAL | Status: DC | PRN

## 2021-01-14 MED ORDER — METOPROLOL TARTRATE 5 MG/5ML IV SOLN: 2.0000 mg | INTRAVENOUS | Status: DC | PRN

## 2021-01-14 MED ORDER — LIDOCAINE 2% (20 MG/ML) 5 ML SYRINGE
INTRAMUSCULAR | Status: DC | PRN
  Administered 2021-01-14: 100 mg via INTRAVENOUS

## 2021-01-14 MED ORDER — DEXMEDETOMIDINE (PRECEDEX) IN NS 20 MCG/5ML (4 MCG/ML) IV SYRINGE
PREFILLED_SYRINGE | INTRAVENOUS | Status: DC | PRN
  Administered 2021-01-14: 20 ug via INTRAVENOUS

## 2021-01-14 MED ORDER — SODIUM CHLORIDE 0.9 % IV SOLN: 500.0000 mL | Freq: Once | INTRAVENOUS | Status: DC | PRN

## 2021-01-14 MED ORDER — HEPARIN 6000 UNIT IRRIGATION SOLUTION
Status: AC
  Filled 2021-01-14: qty 500

## 2021-01-14 MED ORDER — HEPARIN SODIUM (PORCINE) 1000 UNIT/ML IJ SOLN
INTRAMUSCULAR | Status: DC | PRN
  Administered 2021-01-14: 2000 [IU] via INTRAVENOUS
  Administered 2021-01-14: 9000 [IU] via INTRAVENOUS

## 2021-01-14 MED ORDER — LACTATED RINGERS IV SOLN: INTRAVENOUS | Status: DC | PRN

## 2021-01-14 MED ORDER — PROPOFOL 10 MG/ML IV BOLUS
INTRAVENOUS | Status: DC | PRN
  Administered 2021-01-14: 30 mg via INTRAVENOUS
  Administered 2021-01-14: 90 mg via INTRAVENOUS

## 2021-01-14 MED ORDER — 0.9 % SODIUM CHLORIDE (POUR BTL) OPTIME
TOPICAL | Status: DC | PRN
  Administered 2021-01-14: 2000 mL

## 2021-01-14 MED ORDER — SENNOSIDES-DOCUSATE SODIUM 8.6-50 MG PO TABS
1.0000 | ORAL_TABLET | Freq: Every evening | ORAL | Status: DC | PRN
  Administered 2021-01-17 – 2021-01-18 (×2): 1 via ORAL
  Filled 2021-01-14 (×2): qty 1

## 2021-01-14 MED ORDER — SODIUM CHLORIDE 0.9 % IV SOLN: INTRAVENOUS | Status: DC

## 2021-01-14 MED ORDER — PANTOPRAZOLE SODIUM 40 MG PO TBEC
40.0000 mg | DELAYED_RELEASE_TABLET | Freq: Every day | ORAL | Status: DC
  Administered 2021-01-15 – 2021-01-19 (×5): 40 mg via ORAL
  Filled 2021-01-14 (×5): qty 1

## 2021-01-14 MED ORDER — PHENYLEPHRINE HCL-NACL 10-0.9 MG/250ML-% IV SOLN: INTRAVENOUS | Status: DC | PRN

## 2021-01-14 MED ORDER — PHENYLEPHRINE 40 MCG/ML (10ML) SYRINGE FOR IV PUSH (FOR BLOOD PRESSURE SUPPORT)
PREFILLED_SYRINGE | INTRAVENOUS | Status: DC | PRN
  Administered 2021-01-14 (×2): 120 ug via INTRAVENOUS
  Administered 2021-01-14: 80 ug via INTRAVENOUS

## 2021-01-14 MED ORDER — GUAIFENESIN-DM 100-10 MG/5ML PO SYRP: 15.0000 mL | ORAL_SOLUTION | ORAL | Status: DC | PRN

## 2021-01-14 MED ORDER — HEPARIN 6000 UNIT IRRIGATION SOLUTION
Status: DC | PRN
  Administered 2021-01-14: 1

## 2021-01-14 MED ORDER — ACETAMINOPHEN 325 MG PO TABS
325.0000 mg | ORAL_TABLET | ORAL | Status: DC | PRN
  Administered 2021-01-15 – 2021-01-19 (×2): 650 mg via ORAL
  Filled 2021-01-14 (×2): qty 2

## 2021-01-14 MED ORDER — MAGNESIUM SULFATE 2 GM/50ML IV SOLN: 2.0000 g | Freq: Every day | INTRAVENOUS | Status: DC | PRN

## 2021-01-14 MED ORDER — PROTAMINE SULFATE 10 MG/ML IV SOLN
INTRAVENOUS | Status: DC | PRN
  Administered 2021-01-14 (×2): 10 mg via INTRAVENOUS
  Administered 2021-01-14: 20 mg via INTRAVENOUS

## 2021-01-14 SURGICAL SUPPLY — 65 items
ADH SKN CLS APL DERMABOND .7 (GAUZE/BANDAGES/DRESSINGS) ×4
AGENT HMST SPONGE THK3/8 (HEMOSTASIS)
BAG COUNTER SPONGE SURGICOUNT (BAG) ×2 IMPLANT
BAG SPNG CNTER NS LX DISP (BAG) ×1
BANDAGE ESMARK 6X9 LF (GAUZE/BANDAGES/DRESSINGS) ×1 IMPLANT
BLADE CLIPPER SURG (BLADE) ×2 IMPLANT
BNDG CMPR 9X6 STRL LF SNTH (GAUZE/BANDAGES/DRESSINGS) ×1
BNDG ESMARK 6X9 LF (GAUZE/BANDAGES/DRESSINGS) ×2
CANISTER SUCT 3000ML PPV (MISCELLANEOUS) ×2 IMPLANT
CANNULA VESSEL 3MM 2 BLNT TIP (CANNULA) ×2 IMPLANT
CATH EMB 3FR 80CM (CATHETERS) IMPLANT
CATH EMB 4FR 80CM (CATHETERS) IMPLANT
CATH EMB 5FR 80CM (CATHETERS) IMPLANT
CLIP VESOCCLUDE MED 24/CT (CLIP) ×2 IMPLANT
CLIP VESOCCLUDE SM WIDE 24/CT (CLIP) ×2 IMPLANT
COVER PROBE W GEL 5X96 (DRAPES) ×2 IMPLANT
CUFF TOURN SGL QUICK 18X4 (TOURNIQUET CUFF) IMPLANT
CUFF TOURN SGL QUICK 24 (TOURNIQUET CUFF) ×2
CUFF TOURN SGL QUICK 34 (TOURNIQUET CUFF)
CUFF TOURN SGL QUICK 42 (TOURNIQUET CUFF) IMPLANT
CUFF TRNQT CYL 24X4X16.5-23 (TOURNIQUET CUFF) ×1 IMPLANT
CUFF TRNQT CYL 34X4.125X (TOURNIQUET CUFF) IMPLANT
DERMABOND ADVANCED (GAUZE/BANDAGES/DRESSINGS) ×8
DERMABOND ADVANCED .7 DNX12 (GAUZE/BANDAGES/DRESSINGS) ×4 IMPLANT
DRAIN CHANNEL 15F RND FF W/TCR (WOUND CARE) IMPLANT
DRAPE C-ARM 42X72 X-RAY (DRAPES) ×2 IMPLANT
DRAPE HALF SHEET 40X57 (DRAPES) IMPLANT
ELECT REM PT RETURN 9FT ADLT (ELECTROSURGICAL) ×2
ELECTRODE REM PT RTRN 9FT ADLT (ELECTROSURGICAL) ×1 IMPLANT
EVACUATOR SILICONE 100CC (DRAIN) IMPLANT
GLOVE SRG 8 PF TXTR STRL LF DI (GLOVE) ×1 IMPLANT
GLOVE SURG ENC MOIS LTX SZ7.5 (GLOVE) ×2 IMPLANT
GLOVE SURG UNDER POLY LF SZ8 (GLOVE) ×2
GOWN STRL REUS W/ TWL LRG LVL3 (GOWN DISPOSABLE) ×2 IMPLANT
GOWN STRL REUS W/ TWL XL LVL3 (GOWN DISPOSABLE) ×2 IMPLANT
GOWN STRL REUS W/TWL LRG LVL3 (GOWN DISPOSABLE) ×4
GOWN STRL REUS W/TWL XL LVL3 (GOWN DISPOSABLE) ×4
HEMOSTAT SPONGE AVITENE ULTRA (HEMOSTASIS) IMPLANT
KIT BASIN OR (CUSTOM PROCEDURE TRAY) ×2 IMPLANT
KIT TURNOVER KIT B (KITS) ×2 IMPLANT
NS IRRIG 1000ML POUR BTL (IV SOLUTION) ×4 IMPLANT
PACK PERIPHERAL VASCULAR (CUSTOM PROCEDURE TRAY) ×2 IMPLANT
PAD ARMBOARD 7.5X6 YLW CONV (MISCELLANEOUS) ×4 IMPLANT
STOPCOCK 4 WAY LG BORE MALE ST (IV SETS) ×2 IMPLANT
SUT MNCRL AB 4-0 PS2 18 (SUTURE) ×10 IMPLANT
SUT PROLENE 5 0 C 1 24 (SUTURE) ×6 IMPLANT
SUT PROLENE 6 0 BV (SUTURE) ×4 IMPLANT
SUT PROLENE 7 0 BV 1 (SUTURE) IMPLANT
SUT PROLENE 7 0 BV1 MDA (SUTURE) ×2 IMPLANT
SUT SILK 2 0 PERMA HAND 18 BK (SUTURE) IMPLANT
SUT SILK 2 0 SH (SUTURE) ×2 IMPLANT
SUT SILK 3 0 (SUTURE) ×2
SUT SILK 3-0 18XBRD TIE 12 (SUTURE) ×1 IMPLANT
SUT SILK 4 0 (SUTURE) ×2
SUT SILK 4-0 18XBRD TIE 12 (SUTURE) ×1 IMPLANT
SUT VIC AB 2-0 CT1 27 (SUTURE) ×2
SUT VIC AB 2-0 CT1 TAPERPNT 27 (SUTURE) ×1 IMPLANT
SUT VIC AB 3-0 SH 27 (SUTURE) ×8
SUT VIC AB 3-0 SH 27X BRD (SUTURE) ×4 IMPLANT
SYR 3ML LL SCALE MARK (SYRINGE) ×2 IMPLANT
TOWEL GREEN STERILE (TOWEL DISPOSABLE) ×2 IMPLANT
TRAY FOLEY MTR SLVR 16FR STAT (SET/KITS/TRAYS/PACK) ×2 IMPLANT
TUBING EXTENTION W/L.L. (IV SETS) ×2 IMPLANT
UNDERPAD 30X36 HEAVY ABSORB (UNDERPADS AND DIAPERS) ×2 IMPLANT
WATER STERILE IRR 1000ML POUR (IV SOLUTION) ×2 IMPLANT

## 2021-01-14 NOTE — Anesthesia Procedure Notes (Signed)
Arterial Line Insertion Performed by: Val Eagle, MD, CRNA  Patient location: OR. Preanesthetic checklist: patient identified, surgical consent, monitors and equipment checked, pre-op evaluation and timeout performed Patient sedated Left, radial was placed Catheter size: 22 G  Attempts: 2 Procedure performed without using ultrasound guided technique. Following insertion, dressing applied and Biopatch. Post procedure assessment: normal  Patient tolerated the procedure well with no immediate complications.

## 2021-01-14 NOTE — Anesthesia Procedure Notes (Signed)
Procedure Name: Intubation Date/Time: 01/14/2021 1:31 PM Performed by: Mariea Clonts, CRNA Pre-anesthesia Checklist: Patient identified, Emergency Drugs available, Suction available and Patient being monitored Patient Re-evaluated:Patient Re-evaluated prior to induction Oxygen Delivery Method: Circle System Utilized Preoxygenation: Pre-oxygenation with 100% oxygen Induction Type: IV induction Laryngoscope Size: Mac and 3 Grade View: Grade I Tube type: Oral Tube size: 7.5 mm Number of attempts: 1 Airway Equipment and Method: Stylet and Bite block Placement Confirmation: ETT inserted through vocal cords under direct vision, positive ETCO2 and breath sounds checked- equal and bilateral Secured at: 23 cm Tube secured with: Tape Dental Injury: Teeth and Oropharynx as per pre-operative assessment

## 2021-01-14 NOTE — Progress Notes (Addendum)
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  Extensive tissue loss left foot.  Discussed limb at risk even with bypass.  Risk and beenfits discussed including poor durability of bypass, anesthesia, bleeding, infections, etc.  ZABDIEL DRIPPS 01/14/2021 12:44 PM --

## 2021-01-14 NOTE — Anesthesia Preprocedure Evaluation (Addendum)
Anesthesia Evaluation  Patient identified by MRN, date of birth, ID band Patient awake    Reviewed: Allergy & Precautions, NPO status , Patient's Chart, lab work & pertinent test results  History of Anesthesia Complications Negative for: history of anesthetic complications  Airway Mallampati: I  TM Distance: >3 FB Neck ROM: Full    Dental  (+) Edentulous Upper, Poor Dentition, Dental Advisory Given   Pulmonary neg shortness of breath, neg COPD, neg recent URI, Current Smoker and Patient abstained from smoking.,  Covid-19 tested positive 6 days ago    (-) decreased breath sounds(-) wheezing      Cardiovascular + Peripheral Vascular Disease   Rhythm:Regular  PAD S/P left femoral-tibial bypass with thrombus    Neuro/Psych neg Seizures Peripheral neuropathy  Neuromuscular disease negative psych ROS   GI/Hepatic negative GI ROS, (+)     substance abuse  alcohol use and cocaine use,   Endo/Other  diabetes, Poorly Controlled, Type 2, Oral Hypoglycemic AgentsObesity  Renal/GU negative Renal ROS  negative genitourinary   Musculoskeletal Diabetic foot ulcer   Abdominal (+) - obese,   Peds  Hematology  (+) Blood dyscrasia, anemia , Lab Results      Component                Value               Date                      WBC                      6.8                 01/14/2021                HGB                      7.9 (L)             01/14/2021                HCT                      26.1 (L)            01/14/2021                MCV                      66.2 (L)            01/14/2021                PLT                      351                 01/14/2021              Anesthesia Other Findings   Reproductive/Obstetrics                         Anesthesia Physical Anesthesia Plan  ASA: 3  Anesthesia Plan: General   Post-op Pain Management:    Induction: Intravenous  PONV Risk Score and Plan: 2 and  Treatment may vary due to age or medical condition, Ondansetron and Midazolam  Airway Management Planned: Oral ETT  Additional Equipment: Arterial line  Intra-op Plan:   Post-operative Plan: Extubation in OR  Informed Consent: I have reviewed the patients History and Physical, chart, labs and discussed the procedure including the risks, benefits and alternatives for the proposed anesthesia with the patient or authorized representative who has indicated his/her understanding and acceptance.     Dental advisory given  Plan Discussed with: CRNA and Surgeon  Anesthesia Plan Comments: (Patient will go directly to OR due to Covid status. Will place A line in OR.)     Anesthesia Quick Evaluation

## 2021-01-14 NOTE — Progress Notes (Signed)
Inpatient Diabetes Program Recommendations  AACE/ADA: New Consensus Statement on Inpatient Glycemic Control (2015)  Target Ranges:  Prepandial:   less than 140 mg/dL      Peak postprandial:   less than 180 mg/dL (1-2 hours)      Critically ill patients:  140 - 180 mg/dL   Lab Results  Component Value Date   GLUCAP 114 (H) 01/14/2021   HGBA1C 6.2 (H) 01/08/2021    Review of Glycemic Control  Diabetes history: prediabetes Outpatient Diabetes medications: Metformin 500 mg at breakfast Current orders for Inpatient glycemic control: Novolog SENSITIVE correction scale TID  Inpatient Diabetes Program Recommendations:   Received diabetes coordinator consult. Note that patient's HgbA1C is 6.2% for prediabetes. Staff RN reported that patient wanted information about diabetes. Ordered Living Well with Diabetes booklet, attached Exit Notes on prediabetes meal plan and diagnosing Type 2 diabetes. Inpatient diabetes team will follow up with patient tomorrow since patient is going to surgery today. Noted that patient may need blood glucose meter kit since he does not have insurance. Will need to follow up with PCP at discharge.   Harvel Ricks RN BSN CDE Diabetes Coordinator Pager: [phone]  8am-5pm

## 2021-01-14 NOTE — Discharge Instructions (Addendum)
° °[facility] ° °Discharge instructions ° °Lower Extremity Bypass Surgery ° °Please refer to the following instruction for your post-procedure care. Your surgeon or physician assistant will discuss any changes with you. ° °Activity ° °You are encouraged to walk as much as you can. You can slowly return to normal activities during the month after your surgery. Avoid strenuous activity and heavy lifting until your doctor tells you it's OK. Avoid activities such as vacuuming or swinging a golf club. Do not drive until your doctor give the OK and you are no longer taking prescription pain medications. It is also normal to have difficulty with sleep habits, eating and bowel movement after surgery. These will go away with time. ° °Bathing/Showering ° °Shower daily after you go home. Do not soak in a bathtub, hot tub, or swim until the incision heals completely. ° °Incision Care ° °Clean your incision with mild soap and water. Shower every day. Pat the area dry with a clean towel. You do not need a bandage unless otherwise instructed. Do not apply any ointments or creams to your incision. If you have open wounds you will be instructed how to care for them or a visiting nurse may be arranged for you. If you have staples or sutures along your incision they will be removed at your post-op appointment. You may have skin glue on your incision. Do not peel it off. It will come off on its own in about one week. ° °Wash the groin wound with soap and water daily and pat dry. (No tub bath-only shower)  Then put a dry gauze or washcloth in the groin to keep this area dry to help prevent wound infection.  Do this daily and as needed.  Do not use Vaseline or neosporin on your incisions.  Only use soap and water on your incisions and then protect and keep dry. ° °Diet ° °Resume your normal diet. There are no special food restrictions following this procedure. A low fat/ low cholesterol diet is  recommended for all patients with vascular disease. In order to heal from your surgery, it is CRITICAL to get adequate nutrition. Your body requires vitamins, minerals, and protein. Vegetables are the best source of vitamins and minerals. Vegetables also provide the perfect balance of protein. Processed food has little nutritional value, so try to avoid this. ° °Medications ° °Resume taking all your medications unless your doctor or physician assistant tells you not to. If your incision is causing pain, you may take over-the-counter pain relievers such as acetaminophen (Tylenol). If you were prescribed a stronger pain medication, please aware these medication can cause nausea and constipation. Prevent nausea by taking the medication with a snack or meal. Avoid constipation by drinking plenty of fluids and eating foods with high amount of fiber, such as fruits, vegetables, and grains. Take Colace 100 mg (an over-the-counter stool softener) twice a day as needed for constipation.  °Do not take Tylenol if you are taking prescription pain medications. ° °Follow Up ° °Our office will schedule a follow up appointment 2-3 weeks following discharge. ° °Please call us immediately for any of the following conditions ° °•Severe or worsening pain in your legs or feet while at rest or while walking •Increase pain, redness, warmth, or drainage (pus) from your incision site(s) °• Fever of 101 degree or higher °• The swelling in your leg with the bypass suddenly worsens and becomes more painful than when you were in the hospital °•   been instructed to feel your graft pulse then you should do so every day. If you can no longer feel this pulse, call the office immediately. Not all patients are given this instruction.  Leg swelling is common after leg bypass surgery.  The swelling should improve over a few months following surgery. To improve the swelling, you may elevate your legs above the level of your heart while you are  sitting or resting. Your surgeon or physician assistant may ask you to apply an ACE wrap or wear compression (TED) stockings to help to reduce swelling.  Reduce your risk of vascular disease  Stop smoking. If you would like help call [facility] at 1-800-QUIT-NOW (314)616-8227) or [place] at (251)740-0049.  Manage your cholesterol Maintain a desired weight Control your diabetes weight Control your diabetes Keep your blood pressure down  If you have any questions, please call the office at [phone]         Glucose Products:  ReliOnT glucose products raise low blood sugar fast. Tablets are free of fat, caffeine, sodium and gluten. They are portable and easy to carry, making it easier for people with diabetes to BE PREPARED for lows.  Glucose Tablets Available in 6 flavors  10 ct...................................... $1.00  50 ct...................................... $3.98 Glucose Shot..................................$1.48 Glucose Gel....................................$3.44  Alcohol Swabs Alcohol swabs are used to sterilize your injection site. All of our swabs are individually wrapped for maximum safety, convenience and moisture retention. ReliOnT Alcohol Swabs  100 ct Swabs..............................$1.00  400 ct Swabs..............................$3.74  Lancets ReliOnT offers three lancet options conveniently designed to work with almost every lancing device. Each features a protective disk, which guarantees sterility before testing. ReliOnT Lancets  100 ct Lancets $1.56  200 ct Lancets $2.64 Available in Ultra-Thin, Thin & Micro-Thin ReliOnT 2-IN-1 Lancing Device  50 ct Lancets..................................... $3.44 Available in 30 gauge and 25 gauge ReliOnT Lancing Device....................$5.84  Blood Glucose Monitors ReliOnT offers a full range of blood glucose testing options to provide an accurate, affordable system that meets each person's  unique needs and preferences. Prime Meter....................................... $9.00 Prime Test Strips  25 test strips.................................... $5.00  50 test strips.................................... $9.00  100 test strips.................................$17.88 Premier BLU Meter  ............  $18.98 Premier Voice Meter  .............  $14.98 Premier Test Strips  50 test strips.................................... $9.00  100 test strips.................................$17.88 Premier [employer]  ............  $19.44 Kit includes:  50 test strips  10 lancets  Lancing device  Carry case  Ketone Test Strips  50 test strips  ................  $6.64  Human Insulin  Novolin/ReliOnT (recombinant DNA origin) is manufactured for [employer] by [employer] Insulin* with Vial..........$24.88 Available in N, R, 70/30 Novolin/ReliOnT Insulin Pens*  .....  $42.88 Available in N, R, 70/30  Insulin Delivery ReliOnT syringes and pen needles provide precision technology, comfort and accuracy in insulin delivery at affordable prices. ReliOnT Pen Needles*  50 ct....................................................$9.00 Available in 51mm, 21mm, 22mm & 41mm ReliOnT Insulin Syringes*  100 ct ............ $12.58 Available in 29G, 30G & 31G (3/10cc, 1/2cc & 1cc units)

## 2021-01-14 NOTE — Op Note (Signed)
Date: January 14, 2021  Preoperative diagnosis: Critical limb ischemia of the left lower extremity with tissue loss including left fourth toe ulceration and heel wound  Postoperative diagnosis: Same  Procedure: 1.  Harvest of left leg great saphenous vein 2.  Left above-knee popliteal artery to posterior tibial artery bypass at the ankle with reversed ipsilateral great saphenous vein  Surgeon: Dr. Cephus Shelling, MD  Assistant: Dr. Fabienne Bruns MD and Nathanial Rancher, [place]  Indications: Patient is a 55 year old male seen in consultation by my partner Dr. Lenell Antu with tissue loss in the left lower extremity.  He underwent angiogram that showed significant tibial disease.  He was then scheduled for left above-knee popliteal to PT bypass and presents today after risk benefits discussed.  An assistant was needed for exposure and expedite the case.  Findings: The great saphenous vein was of marginal caliber and was harvested from the saphenofemoral junction down to the mid calf.  Ultimately bypass was sewn from the above-knee popliteal artery tunneled anatomic behind the knee and then through the saphenectomy sites in the lower calf to the posterior tibial artery just above the ankle.  We elected to do this reversed given the vein was fairly friable and I was concerned about running a valvulotome through it.  There was a palpable pulse in the bypass and a brisk PT signal at the ankle upon completion.  Anesthesia: General  EBL: 100 mL  Details: Patient was taken to the operating room after informed consent was obtained.  Placed on the operative table in the supine position.  General endotracheal anesthesia was induced.  I used a ultrasound and evaluated the saphenous vein in both lower extremities and they were about equal caliber.  We elected to harvest vein from the left leg and this was marked from the left saphenofemoral junction all the way down to the mid calf.  The left leg was then prepped  and draped in usual sterile fashion.  Timeout was performed.  Antibiotics were given.  I initially started with longitudinal incision in the left mid calf and started harvesting saphenous vein while my partner Dr. Darrick Penna started in the thigh also harvesting saphenous vein through a separate skip incision.  Ultimately we continue to mobilize with multiple skip incisions in the thigh and several skip incisions in the left calf until the saphenous vein was harvested throughout its length from the saphenofemoral junction down to the mid calf.  It should be noted that down below the knee it became quite small and all side branches were ligated between silk ties and divided.  Ultimately once I felt it was no longer usable, I ligated this over a right angle clamp and then passed it through all the skin tunnels and then it was oversewn at the saphenofemoral junction with a 5-0 Prolene.  The vein was then passed off the field and Dr. Darrick Penna reversed it and placed a vessel cannula it appeared to dilate nicely and several branches were repaired with 6-0 Prolene.  I then exposed the PT target at the ankle based on the arteriogram.  I made a longitudinal incision over the PT artery and the facia was opened and the artery was dissected away from the adjacent veins until we had enough length for anastomosis.  I then went up to the vein harvest site in the distal calf and ultimately I mobilized sartorius posteriorly and entered the above-knee popliteal space and identified the above-knee popliteal artery and although was diseased he did have  a palpable pulse.  Once the vein was harvested, we elected to do a reversed bypass given it was quite friable and thin.  At that point in time, the vein was reversed on the field and I used Vesseloops to control the above-knee popliteal artery after 100 units per kilogram IV heparin was given and this circulated for 2 minutes.  The above-knee popliteal artery was opened with 11 blade scalpel  extended Potts scissors.  The vein was spatulated and end-to-side anastomosis was sewn to the above-knee popliteal artery with 5-0 Prolene.  We had pulsatile flow through the bypass and then this was marked to ensure no twisting.  It was then tunneled behind the knee in the anatomic position and then down through the saphenectomy sites in the medial calf.  We straighten the leg to ensure we had an appropriate length.  The posterior tibial artery at the ankle was then controlled with Serafin clamps and opened with a 11 blade scalpel extended with Potts scissors.  I passed a #1.5 and a #2 dilator down the distal PT with no resistance and good back-bleeding.  We spatulated the vein and end-to-side anastomosis was sewn with 6-0 Prolene parachute technique to the posterior tibial artery.  We did de-air everything prior to completion.  There was a palpable pulse in the bypass and a PT signal in the foot.  Protamine was given for reversal.  All the incisions were irrigated out and closed with multiple layers of 3-0 Vicryl 4-0 Monocryl and Dermabond.  Complication: None  Condition: Stable  Cephus Shelling, MD [facility] Office: [phone]   Cephus Shelling

## 2021-01-14 NOTE — Progress Notes (Signed)
PROGRESS NOTE  Tony Hicks FIE:[id number] DOB: 05-29-1966 DOA: 01/08/2021 PCP: Rema Fendt, NP   LOS: 6 days   Brief Narrative / Interim history: 55 year old male with DM2, polysubstance abuse (tobacco, alcohol, cocaine) who comes in with worsening left foot pain.  He has several ulcers there present for the last 2 months, seen by podiatry but overall without improvement.  Pain is gotten worse, ulcers have been getting larger and he has noticed some purulent discharges.  He eventually came to the ER and was admitted to the hospital.  Podiatry consulted.  Incidentally he is Covid 19 was positive  Subjective / 24h Interval events: Complains of pain and swelling in the left foot  Assessment & Plan: Principal Problem Sepsis secondary to diabetic foot ulcer with cellulitis-febrile in the ED, was tachycardic, had elevated lactic acid with likely source from his diabetic foot ulcer.  Podiatry consulted.   -ABI showed severe vascular disease, vascular surgery consulted.  He is status post arteriogram on 7/8 with popliteal artery occlusion on the left lower extremity behind the knee, will get left above-knee popliteal to posterior tibial artery bypass today -CT scan of the foot with ulceration of the lateral aspect of the heel but no underlying abscess, osteomyelitis or anything else acute -Continue antibiotics for now, will narrow after the procedure  Active Problems COVID-19-remains asymptomatic.  Needs 10 days of isolation from 7/5 when he was first diagnosed  Type 2 diabetes mellitus, with hyperglycemia-A1c 6.2.  Patient was placed on sliding scale.  CBGs controlled today  CBG (last 3)  Recent Labs    01/13/21 1506 01/13/21 2136 01/14/21 0611  GLUCAP 143* 118* 119*   Microcytic anemia, iron deficiency anemia-monitor, received IV iron, will place on oral iron on discharge  Hyponatremia-monitor, query related to EtOH  Polysubstance abuse-Patient admits to drinking significant amount  of alcohol most days, smoking tobacco, and snorting cocaine recently. CIWA protocols initiated, does not appear to be withdrawing   PAD- Patient with decreased pulses noted in his lower extremities.  Plans per vascular as above  Scheduled Meds:  aspirin EC  81 mg Oral Daily   atorvastatin  40 mg Oral Daily   enoxaparin (LOVENOX) injection  40 mg Subcutaneous Q24H   folic acid  1 mg Oral Daily   gabapentin  300 mg Oral BID   insulin aspart  0-9 Units Subcutaneous TID WC   living well with diabetes book   Does not apply Once   multivitamin with minerals  1 tablet Oral Daily   nicotine  14 mg Transdermal Daily   nutrition supplement (JUVEN)  1 packet Oral BID BM   Ensure Max Protein  11 oz Oral QHS   sodium chloride flush  3 mL Intravenous Q12H   thiamine  100 mg Oral Daily   Or   thiamine  100 mg Intravenous Daily   Continuous Infusions:  sodium chloride     cefTRIAXone (ROCEPHIN)  IV Stopped (01/13/21 1531)   vancomycin Stopped (01/14/21 0501)   PRN Meds:.sodium chloride, acetaminophen, hydrALAZINE, HYDROcodone-acetaminophen, hydrocortisone cream, labetalol, ondansetron (ZOFRAN) IV, sodium chloride flush  Diet Orders (From admission, onward)     Start     Ordered   01/14/21 0001  Diet NPO time specified  Diet effective midnight        01/13/21 1109            DVT prophylaxis: SCD's Start: 01/11/21 1804 enoxaparin (LOVENOX) injection 40 mg Start: 01/08/21 1500     Code Status:  Full Code  Family Communication: No family at bedside  Status is: Inpatient  Remains inpatient appropriate because:Inpatient level of care appropriate due to severity of illness  Dispo: The patient is from: Home              Anticipated d/c is to: Home              Patient currently is not medically stable to d/c.   Difficult to place patient No  Level of care: Progressive  Consultants:  Podiatry  Vascular surgery  Procedures:  None   Microbiology  Blood cultures without  growth  Antimicrobials: Ceftriaxone, vancomycin   Objective: Vitals:   01/13/21 2134 01/13/21 2346 01/14/21 0613 01/14/21 0815  BP: 125/60 125/71 123/85 123/74  Pulse: 81 77 77 70  Resp: 12 18 19 18   Temp: 97.9 F (36.6 C) 98.2 F (36.8 C) 98 F (36.7 C) 98.3 F (36.8 C)  TempSrc: Oral Oral Oral Oral  SpO2: 97% 100% 100% 100%  Weight:      Height:        Intake/Output Summary (Last 24 hours) at 01/14/2021 1145 Last data filed at 01/14/2021 1013 Gross per 24 hour  Intake 1476 ml  Output 3850 ml  Net -2374 ml    Filed Weights   01/09/21 1200 01/12/21 1554  Weight: 83.9 kg 89.3 kg    Examination:  Constitutional: NAD, in bed Eyes: No scleral icterus ENMT: Moist mucous membranes Neck: normal, supple Respiratory: Clear bilaterally, no wheezing, no crackles Cardiovascular: Regular rate and rhythm, no murmurs Abdomen: Soft, NT, ND, bowel sounds positive Musculoskeletal: no clubbing / cyanosis.  Skin: No rashes appreciated Neurologic: No focal deficits  Data Reviewed: I have independently reviewed following labs and imaging studies   CBC: Recent Labs  Lab 01/08/21 0935 01/10/21 0638 01/11/21 0721 01/11/21 1840 01/14/21 0059  WBC 7.8 5.4 5.6 5.6 6.8  NEUTROABS 6.3  --   --   --   --   HGB 8.2* 8.1* 7.9* 8.1* 7.9*  HCT 27.7* 26.6* 25.7* 27.2* 26.1*  MCV 65.2* 63.6* 63.8* 64.3* 66.2*  PLT 387 331 340 352 351    Basic Metabolic Panel: Recent Labs  Lab 01/08/21 0935 01/09/21 0359 01/10/21 0638 01/11/21 1840 01/14/21 0059  NA 131*  --  133*  --  134*  K 3.6  --  4.1  --  4.1  CL 97*  --  100  --  102  CO2 24  --  26  --  25  GLUCOSE 175*  --  99  --  93  BUN 5*  --  <5*  --  12  CREATININE 0.85  --  0.87 0.75 0.71  CALCIUM 9.2  --  8.8*  --  9.1  MG  --  1.9  --   --   --   PHOS  --  4.9*  --   --   --     Liver Function Tests: Recent Labs  Lab 01/08/21 0935 01/10/21 0638  AST 21 20  ALT 15 15  ALKPHOS 54 44  BILITOT 0.8 0.6  PROT 7.3  6.7  ALBUMIN 3.2* 2.9*    Coagulation Profile: No results for input(s): INR, PROTIME in the last 168 hours. HbA1C: No results for input(s): HGBA1C in the last 72 hours.  CBG: Recent Labs  Lab 01/13/21 0626 01/13/21 1227 01/13/21 1506 01/13/21 2136 01/14/21 0611  GLUCAP 98 121* 143* 118* 119*     Recent Results (from the  past 240 hour(s))  Culture, blood (routine x 2)     Status: None   Collection Time: 01/08/21 12:44 PM   Specimen: BLOOD  Result Value Ref Range Status   Specimen Description BLOOD RIGHT ANTECUBITAL  Final   Special Requests   Final    BOTTLES DRAWN AEROBIC AND ANAEROBIC Blood Culture results may not be optimal due to an inadequate volume of blood received in culture bottles   Culture   Final    NO GROWTH 5 DAYS Performed at [facility], [address], [place], [place] [address]    Report Status 01/13/2021 FINAL  Final  Culture, blood (routine x 2)     Status: None   Collection Time: 01/08/21 12:51 PM   Specimen: BLOOD  Result Value Ref Range Status   Specimen Description BLOOD RIGHT ANTECUBITAL  Final   Special Requests   Final    BOTTLES DRAWN AEROBIC AND ANAEROBIC Blood Culture results may not be optimal due to an inadequate volume of blood received in culture bottles   Culture   Final    NO GROWTH 5 DAYS Performed at [facility], [address] [place], [place] [address]    Report Status 01/13/2021 FINAL  Final  SARS CORONAVIRUS 2 (TAT 6-24 HRS) Nasopharyngeal Nasopharyngeal Swab     Status: Abnormal   Collection Time: 01/08/21  3:26 PM   Specimen: Nasopharyngeal Swab  Result Value Ref Range Status   SARS Coronavirus 2 POSITIVE (A) NEGATIVE Final    Comment: (NOTE) SARS-CoV-2 target nucleic acids are DETECTED.  The SARS-CoV-2 RNA is generally detectable in upper and lower respiratory specimens during the acute phase of infection. Positive results are indicative of the presence of SARS-CoV-2 RNA. Clinical correlation with  patient history and other diagnostic information is  necessary to determine patient infection status. Positive results do not rule out bacterial infection or co-infection with other viruses.  The expected result is Negative.  Fact Sheet for Patients: HairSlick.no  Fact Sheet for Healthcare Providers: quierodirigir.com  This test is not yet approved or cleared by the [place] FDA and  has been authorized for detection and/or diagnosis of SARS-CoV-2 by FDA under an Emergency Use Authorization (EUA). This EUA will remain  in effect (meaning this test can be used) for the duration of the COVID-19 declaration under Section 564(b)(1) of the Act, 21 U. S.C. section 360bbb-3(b)(1), unless the authorization is terminated or revoked sooner.   Performed at [facility], [address] [place], [place] [address]       Radiology Studies: No results found.   Pamella Pert, MD, PhD [facility]  Between 7 am - 7 pm I am available, please contact me via Amion (for emergencies) or Securechat (non urgent messages)  Between 7 pm - 7 am I am not available, please contact night coverage MD/APP via Amion

## 2021-01-14 NOTE — Transfer of Care (Signed)
Immediate Anesthesia Transfer of Care Note  Patient: Tony Hicks  Procedure(s) Performed: LEFT ABOVE KNEE TO  POSTERIOR TIBIAL BYPASS GRAFT WITH GREATER SAPHENOUS VEIN (Left: Leg Lower)  Patient Location: PACU  Anesthesia Type:General  Level of Consciousness: awake and alert   Airway & Oxygen Therapy: Patient Spontanous Breathing and Patient connected to face mask oxygen  Post-op Assessment: Report given to RN and Post -op Vital signs reviewed and stable  Post vital signs: Reviewed and stable  Last Vitals:  Vitals Value Taken Time  BP 137/93 01/14/21 1658  Temp    Pulse 79 01/14/21 1703  Resp 18 01/14/21 1703  SpO2 97 % 01/14/21 1703  Vitals shown include unvalidated device data.  Last Pain:  Vitals:   01/14/21 0930  TempSrc:   PainSc: 0-No pain      Patients Stated Pain Goal: 0 (01/14/21 0930)  Complications: No notable events documented.

## 2021-01-15 ENCOUNTER — Encounter (HOSPITAL_COMMUNITY): Payer: Self-pay | Admitting: Vascular Surgery

## 2021-01-15 ENCOUNTER — Telehealth: Payer: Self-pay | Admitting: Family

## 2021-01-15 ENCOUNTER — Encounter: Payer: Self-pay | Admitting: Family

## 2021-01-15 LAB — LIPID PANEL
Cholesterol: 114 mg/dL (ref 0–200)
HDL: 45 mg/dL (ref >40)
LDL Cholesterol: 62 mg/dL (ref 0–99)
Total CHOL/HDL Ratio: 2.5 RATIO
Triglycerides: 35 mg/dL (ref <150)
VLDL: 7 mg/dL (ref 0–40)

## 2021-01-15 LAB — BASIC METABOLIC PANEL
Anion gap: 8 (ref 5–15)
BUN: 7 mg/dL (ref 6–20)
CO2: 23 mmol/L (ref 22–32)
Calcium: 8.9 mg/dL (ref 8.9–10.3)
Chloride: 103 mmol/L (ref 98–111)
Creatinine, Ser: 0.63 mg/dL (ref 0.61–1.24)
GFR, Estimated: >60 mL/min (ref >60)
Glucose, Bld: 95 mg/dL (ref 70–99)
Potassium: 4.2 mmol/L (ref 3.5–5.1)
Sodium: 134 mmol/L — ABNORMAL LOW (ref 135–145)

## 2021-01-15 LAB — CBC
HCT: 25.3 % — ABNORMAL LOW (ref 39.0–52.0)
Hemoglobin: 7.8 g/dL — ABNORMAL LOW (ref 13.0–17.0)
MCH: 20.4 pg — ABNORMAL LOW (ref 26.0–34.0)
MCHC: 30.8 g/dL (ref 30.0–36.0)
MCV: 66.2 fL — ABNORMAL LOW (ref 80.0–100.0)
Platelets: 322 10*3/uL (ref 150–400)
RBC: 3.82 MIL/uL — ABNORMAL LOW (ref 4.22–5.81)
RDW: 22.9 % — ABNORMAL HIGH (ref 11.5–15.5)
WBC: 13.4 10*3/uL — ABNORMAL HIGH (ref 4.0–10.5)
nRBC: 0 % (ref 0.0–0.2)

## 2021-01-15 LAB — GLUCOSE, CAPILLARY
Glucose-Capillary: 131 mg/dL — ABNORMAL HIGH (ref 70–99)
Glucose-Capillary: 132 mg/dL — ABNORMAL HIGH (ref 70–99)
Glucose-Capillary: 143 mg/dL — ABNORMAL HIGH (ref 70–99)
Glucose-Capillary: 112 mg/dL — ABNORMAL HIGH (ref 70–99)

## 2021-01-15 LAB — POCT ACTIVATED CLOTTING TIME: Activated Clotting Time: 248 seconds

## 2021-01-15 MED ORDER — LIDOCAINE-PRILOCAINE 2.5-2.5 % EX CREA
TOPICAL_CREAM | Freq: Once | CUTANEOUS | Status: AC
  Filled 2021-01-15: qty 5

## 2021-01-15 NOTE — Telephone Encounter (Signed)
Patient called in to cancel his physical appointment. Patient states he is in the hospital and not sure when he will be released. He is asking to speak with Ricky Stabs NP before he reschedules his appointment. He states he needs to find out what is best for him to do.  Patient is asking to be called in his room at the hospital at [phone].

## 2021-01-15 NOTE — Progress Notes (Signed)
PROGRESS NOTE  Tony Hicks YOV:[id number] DOB: [birth year] DOA: 01/08/2021 PCP: Rema Fendt, NP   LOS: 7 days   Brief Narrative / Interim history: 55 year old male with DM2, polysubstance abuse (tobacco, alcohol, cocaine) who comes in with worsening left foot pain.  He has several ulcers there present for the last 2 months, seen by podiatry but overall without improvement.  Pain is gotten worse, ulcers have been getting larger and he has noticed some purulent discharges.  He eventually came to the ER and was admitted to the hospital.  He was found to have COVID-19, incidental, without symptoms.  Podiatry and vascular surgery consulted, he has significant PAD and he is status post left above-knee popliteal artery to posterior tibial artery bypass at the ankle with reversed ipsilateral great saphenous vein on 7/11 by Dr Chestine Spore.  Subjective / 24h Interval events: Tells me his left foot feels about the same.  He was trying to get up and walk but cannot put too much pressure on the left foot.  No chest pain, no shortness of breath  Assessment & Plan: Principal Problem Sepsis secondary to diabetic foot ulcer with cellulitis-febrile in the ED, was tachycardic, had elevated lactic acid with likely source from his diabetic foot ulcer.  Podiatry was consulted on admission. -ABI showed severe vascular disease, vascular surgery consulted. Underwent arteriogram on 7/8 with popliteal artery occlusion on the left lower extremity behind the knee, and was taken to OR 7/11 for left above-knee popliteal to posterior tibial artery bypass. -CT scan of the foot with ulceration of the lateral aspect of the heel but no underlying abscess, osteomyelitis or anything else acute -Podiatry to reevaluate today whether he needs debridement.  Continue antibiotics for now -PT/OT consultation pending  Active Problems COVID-19-remains asymptomatic.  Needs 10 days of isolation from 7/5 when he was first diagnosed  Type 2  diabetes mellitus, with hyperglycemia-A1c 6.2.  Patient was placed on sliding scale.  Monitor CBGs  CBG (last 3)  Recent Labs    01/14/21 1837 01/14/21 2016 01/15/21 0602  GLUCAP 136* 230* 131*   Microcytic anemia, iron deficiency anemia-monitor, received IV iron, will place on oral iron on discharge.  Hemoglobin 7.8, stable from yesterday.  No bleeding, transfuse if less than 7  Hyponatremia-monitor, query related to EtOH.  Overall stable  Polysubstance abuse-Patient admits to drinking significant amount of alcohol most days, smoking tobacco, and snorting cocaine recently. CIWA protocols initiated, does not appear to be withdrawing   Scheduled Meds:  aspirin EC  81 mg Oral Daily   atorvastatin  40 mg Oral Daily   docusate sodium  100 mg Oral Daily   enoxaparin (LOVENOX) injection  40 mg Subcutaneous Q24H   folic acid  1 mg Oral Daily   gabapentin  300 mg Oral BID   insulin aspart  0-9 Units Subcutaneous TID WC   living well with diabetes book   Does not apply Once   multivitamin with minerals  1 tablet Oral Daily   nicotine  14 mg Transdermal Daily   nutrition supplement (JUVEN)  1 packet Oral BID BM   pantoprazole  40 mg Oral Daily   Ensure Max Protein  11 oz Oral QHS   sodium chloride flush  3 mL Intravenous Q12H   thiamine  100 mg Oral Daily   Or   thiamine  100 mg Intravenous Daily   Continuous Infusions:  sodium chloride     sodium chloride     sodium chloride  cefTRIAXone (ROCEPHIN)  IV 0 g (01/13/21 1531)   magnesium sulfate bolus IVPB     vancomycin 1,500 mg (01/15/21 0347)   PRN Meds:.sodium chloride, sodium chloride, acetaminophen **OR** acetaminophen, alum & mag hydroxide-simeth, guaiFENesin-dextromethorphan, hydrALAZINE, HYDROcodone-acetaminophen, hydrocortisone cream, labetalol, magnesium sulfate bolus IVPB, metoprolol tartrate, ondansetron (ZOFRAN) IV, phenol, potassium chloride, senna-docusate, sodium chloride flush  Diet Orders (From admission, onward)      Start     Ordered   01/14/21 1821  Diet Heart Room service appropriate? Yes; Fluid consistency: Thin  Diet effective now       Question Answer Comment  Room service appropriate? Yes   Fluid consistency: Thin      01/14/21 1820            DVT prophylaxis: SCD's Start: 01/14/21 1819 SCD's Start: 01/11/21 1804 enoxaparin (LOVENOX) injection 40 mg Start: 01/08/21 1500     Code Status: Full Code  Family Communication: No family at bedside  Status is: Inpatient  Remains inpatient appropriate because:Inpatient level of care appropriate due to severity of illness  Dispo: The patient is from: Home              Anticipated d/c is to: Home              Patient currently is not medically stable to d/c.   Difficult to place patient No  Level of care: Progressive  Consultants:  Podiatry  Vascular surgery  Procedures:  None   Microbiology  Blood cultures without growth  Antimicrobials: Ceftriaxone, vancomycin   Objective: Vitals:   01/14/21 1730 01/14/21 2012 01/15/21 0005 01/15/21 0552  BP: 126/81 118/69 113/70 119/78  Pulse: 77 84 77 78  Resp: 15 13 20 19   Temp: (!) 97.4 F (36.3 C) 98.1 F (36.7 C) 99 F (37.2 C) 98.6 F (37 C)  TempSrc:  Oral Oral Oral  SpO2: 100% 100% 98% 99%  Weight:      Height:        Intake/Output Summary (Last 24 hours) at 01/15/2021 1004 Last data filed at 01/15/2021 0600 Gross per 24 hour  Intake 1800 ml  Output 1950 ml  Net -150 ml    Filed Weights   01/09/21 1200 01/12/21 1554  Weight: 83.9 kg 89.3 kg    Examination:  Constitutional: No distress, in bed Eyes: No scleral icterus ENMT: mmm Neck: normal, supple Respiratory: Clear to auscultation bilaterally, no wheezing, no crackles Cardiovascular: Regular rate and rhythm, no murmurs appreciated.  No significant peripheral edema Abdomen: Soft, nontender, nondistended, bowel sounds positive Musculoskeletal: no clubbing / cyanosis.  Skin: No rashes  appreciated Neurologic: Nonfocal, equal strength  Data Reviewed: I have independently reviewed following labs and imaging studies   CBC: Recent Labs  Lab 01/10/21 0638 01/11/21 0721 01/11/21 1840 01/14/21 0059 01/15/21 0500  WBC 5.4 5.6 5.6 6.8 13.4*  HGB 8.1* 7.9* 8.1* 7.9* 7.8*  HCT 26.6* 25.7* 27.2* 26.1* 25.3*  MCV 63.6* 63.8* 64.3* 66.2* 66.2*  PLT 331 340 352 351 322    Basic Metabolic Panel: Recent Labs  Lab 01/09/21 0359 01/10/21 0638 01/11/21 1840 01/14/21 0059 01/15/21 0500  NA  --  133*  --  134* 134*  K  --  4.1  --  4.1 4.2  CL  --  100  --  102 103  CO2  --  26  --  25 23  GLUCOSE  --  99  --  93 95  BUN  --  <5*  --  12  7  CREATININE  --  0.87 0.75 0.71 0.63  CALCIUM  --  8.8*  --  9.1 8.9  MG 1.9  --   --   --   --   PHOS 4.9*  --   --   --   --     Liver Function Tests: Recent Labs  Lab 01/10/21 0638  AST 20  ALT 15  ALKPHOS 44  BILITOT 0.6  PROT 6.7  ALBUMIN 2.9*    Coagulation Profile: No results for input(s): INR, PROTIME in the last 168 hours. HbA1C: No results for input(s): HGBA1C in the last 72 hours.  CBG: Recent Labs  Lab 01/14/21 1302 01/14/21 1715 01/14/21 1837 01/14/21 2016 01/15/21 0602  GLUCAP 114* 113* 136* 230* 131*     Recent Results (from the past 240 hour(s))  Culture, blood (routine x 2)     Status: None   Collection Time: 01/08/21 12:44 PM   Specimen: BLOOD  Result Value Ref Range Status   Specimen Description BLOOD RIGHT ANTECUBITAL  Final   Special Requests   Final    BOTTLES DRAWN AEROBIC AND ANAEROBIC Blood Culture results may not be optimal due to an inadequate volume of blood received in culture bottles   Culture   Final    NO GROWTH 5 DAYS Performed at [facility], [address] [place], [place] [address]    Report Status 01/13/2021 FINAL  Final  Culture, blood (routine x 2)     Status: None   Collection Time: 01/08/21 12:51 PM   Specimen: BLOOD  Result Value Ref Range Status    Specimen Description BLOOD RIGHT ANTECUBITAL  Final   Special Requests   Final    BOTTLES DRAWN AEROBIC AND ANAEROBIC Blood Culture results may not be optimal due to an inadequate volume of blood received in culture bottles   Culture   Final    NO GROWTH 5 DAYS Performed at [facility], [address] [place], [place] [address]    Report Status 01/13/2021 FINAL  Final  SARS CORONAVIRUS 2 (TAT 6-24 HRS) Nasopharyngeal Nasopharyngeal Swab     Status: Abnormal   Collection Time: 01/08/21  3:26 PM   Specimen: Nasopharyngeal Swab  Result Value Ref Range Status   SARS Coronavirus 2 POSITIVE (A) NEGATIVE Final    Comment: (NOTE) SARS-CoV-2 target nucleic acids are DETECTED.  The SARS-CoV-2 RNA is generally detectable in upper and lower respiratory specimens during the acute phase of infection. Positive results are indicative of the presence of SARS-CoV-2 RNA. Clinical correlation with patient history and other diagnostic information is  necessary to determine patient infection status. Positive results do not rule out bacterial infection or co-infection with other viruses.  The expected result is Negative.  Fact Sheet for Patients: HairSlick.nohttps://www.fda.gov/media/138098/download  Fact Sheet for Healthcare Providers: quierodirigir.comhttps://www.fda.gov/media/138095/download  This test is not yet approved or cleared by the [place] FDA and  has been authorized for detection and/or diagnosis of SARS-CoV-2 by FDA under an Emergency Use Authorization (EUA). This EUA will remain  in effect (meaning this test can be used) for the duration of the COVID-19 declaration under Section 564(b)(1) of the Act, 21 U. S.C. section 360bbb-3(b)(1), unless the authorization is terminated or revoked sooner.   Performed at [facility], [address] [place], [place] [address]       Radiology Studies: No results found.   Pamella Pertostin Yasmina Chico, MD, PhD [facility]  Between 7 am - 7  pm I am  available, please contact me via Amion (for emergencies) or Securechat (non urgent messages)  Between 7 pm - 7 am I am not available, please contact night coverage MD/APP via Amion

## 2021-01-15 NOTE — Progress Notes (Signed)
Patient currently admitted at [facility]. 

## 2021-01-15 NOTE — Progress Notes (Signed)
[facility]  Subjective  -states his left foot feels better   Objective 113/70 77 99 F (37.2 C) (Oral) 20 98%  Intake/Output Summary (Last 24 hours) at 01/15/2021 0544 Last data filed at 01/14/2021 2100 Gross per 24 hour  Intake 1900 ml  Output 2800 ml  Net -900 ml    Palpable pulse in bypass in left leg at the calf Brisk left PT signal Incisions look good except for the below-knee popliteal incision where there is some separation distally with Steri-Strips  Laboratory Lab Results: Recent Labs    01/14/21 0059  WBC 6.8  HGB 7.9*  HCT 26.1*  PLT 351   BMET Recent Labs    01/14/21 0059  NA 134*  K 4.1  CL 102  CO2 25  GLUCOSE 93  BUN 12  CREATININE 0.71  CALCIUM 9.1    COAG No results found for: INR, PROTIME No results found for: PTT  Assessment/Planning:  Postop day 1 status post left above-knee popliteal to posterior tibial bypass at the ankle for critical limb ischemia with tissue loss.  He has a palpable pulse in the bypass that is tunneled in the subcutaneous tissue below the calf and a brisk PT signal at the ankle.  On aspirin and statin for risk reduction.  Recommend PT OT consult.  Discussed he needs to float his heels to help offload the wound.  BILLY TURVEY 01/15/2021 5:44 AM --

## 2021-01-15 NOTE — Evaluation (Signed)
Occupational Therapy Evaluation Patient Details Name: Tony Hicks MRN: [id number] DOB: Jun 14, 1966 Today's Date: 01/15/2021    History of Present Illness Tony Hicks is a 55 y.o. male with left foot pain and nonhealing wounds. Pt noted with sepsis, cellulitis and incidental COVID-19+ finding. Pt underwent L popliteal to posterior tibial bypass on 7/11. PMH: DM2, polysubstance abuse (tobacco, alcohol, cocaine)   Clinical Impression   PTA, pt lives with sister and reports Independence in all daily tasks without use of AD. Pt recently working in [employer]. Pt presents now with deficits in L LE pain/swelling and opted for self-NWB during activities. Pt able to demo mobility in room using RW and while NWB at min guard with no LOB noted. Pt overall Independent for UB ADLs and min guard for LB ADLs at this time. Encouraged continued elevation/AROM of L LE to decrease swelling, as well as protection of skin integrity. At this time, anticipate no OT services needed at DC. However, if amputation indicated, further rehab may be indicated. Will continue to follow acutely and progress independence with ADLs/mobility.     Follow Up Recommendations  No OT follow up;Supervision - Intermittent    Equipment Recommendations  3 in 1 bedside commode;Other (comment) (Rolling walker)    Recommendations for Other Services       Precautions / Restrictions Precautions Precautions: Fall;Other (comment) Precaution Comments: significant ulcer to L heel Restrictions Weight Bearing Restrictions: No Other Position/Activity Restrictions: no formal orders, but pt self NWB L LE due to pain and significant heel ulcer      Mobility Bed Mobility Overal bed mobility: Modified Independent                  Transfers Overall transfer level: Needs assistance Equipment used: Rolling walker (2 wheeled) Transfers: Sit to/from Stand Sit to Stand: Supervision         General transfer comment: supervision for  sit to stand, no LOB. min guard for turning/dynamic tasks using RW to ensure safety    Balance Overall balance assessment: Needs assistance Sitting-balance support: No upper extremity supported;Feet supported Sitting balance-Leahy Scale: Normal     Standing balance support: Bilateral upper extremity supported;During functional activity Standing balance-Leahy Scale: Poor Standing balance comment: reliant on UE support due to self NWB to L LE                           ADL either performed or assessed with clinical judgement   ADL Overall ADL's : Needs assistance/impaired Eating/Feeding: Independent;Sitting   Grooming: Supervision/safety;Standing;Wash/dry hands Grooming Details (indicate cue type and reason): leaning on sink to maintain balance due to self NWB to L LE, no LOB noted         Upper Body Dressing : Independent;Sitting   Lower Body Dressing: Min guard;Sit to/from stand;Sitting/lateral leans Lower Body Dressing Details (indicate cue type and reason): min guard in standing to maintain balance, able to don sock sitting EOB without issue. Did not use sock on L foot due to pain/skin integrity concerns (pt kept foot off of floor entire session) Toilet Transfer: Min guard;Ambulation;Comfort height toilet;Grab bars;RW Statistician Details (indicate cue type and reason): min guard for safety in mobility, intermittent cues to avoid getting too close inside of walker. no LOB and good maneuvering inside bathroom Toileting- Clothing Manipulation and Hygiene: Supervision/safety;Sit to/from stand Toileting - Clothing Manipulation Details (indicate cue type and reason): for safety, no assist needed     Functional mobility during ADLs:  Min guard;Rolling walker;Cueing for sequencing;Cueing for safety General ADL Comments: Pt with L heel ulcer, swelling and pain - self NWB due to these factors. Pt able to keep weight off of L foot well, benefits from cues for DME/ADL completion  strategies but good carryover     Vision Baseline Vision/History: No visual deficits Patient Visual Report: No change from baseline Vision Assessment?: No apparent visual deficits     Perception     Praxis      Pertinent Vitals/Pain Pain Assessment: 0-10 Pain Score: 10-Worst pain ever Pain Location: L foot Pain Descriptors / Indicators: Grimacing;Guarding;Sore Pain Intervention(s): Limited activity within patient's tolerance;Monitored during session;Repositioned;Patient requesting pain meds-RN notified     Hand Dominance Right   Extremity/Trunk Assessment Upper Extremity Assessment Upper Extremity Assessment: Overall WFL for tasks assessed   Lower Extremity Assessment Lower Extremity Assessment: Defer to PT evaluation   Cervical / Trunk Assessment Cervical / Trunk Assessment: Normal   Communication Communication Communication: No difficulties   Cognition Arousal/Alertness: Awake/alert Behavior During Therapy: WFL for tasks assessed/performed Overall Cognitive Status: Within Functional Limits for tasks assessed                                     General Comments  VSS on RA. Encouraged elevation, AROM of L foot up in recliner.    Exercises     Shoulder Instructions      Home Living Family/patient expects to be discharged to:: Private residence Living Arrangements: Other relatives (sister) Available Help at Discharge: Family;Available PRN/intermittently Type of Home: House Home Access: Stairs to enter Entrance Stairs-Number of Steps: 1   Home Layout: Two level;1/2 bath on main level;Able to live on main level with bedroom/bathroom         Bathroom Toilet: Standard     Home Equipment: Cane - single point   Additional Comments: reports recently moving in with sister, one bedroom and half bath on main level where pt plans to stay. pt reports he does not plan on going up/down stairs inside the home      Prior Functioning/Environment Level  of Independence: Independent        Comments: previously independent with ADLs, IADLs and mobility without use of AD. 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        OT Problem List: Decreased activity tolerance;Impaired balance (sitting and/or standing);Decreased knowledge of use of DME or AE;Pain      OT Treatment/Interventions: Self-care/ADL training;Therapeutic exercise;DME and/or AE instruction;Therapeutic activities;Patient/family education;Balance training    OT Goals(Current goals can be found in the care plan section) Acute Rehab OT Goals Patient Stated Goal: for left leg to heal, avoid amputation if possible OT Goal Formulation: With patient Time For Goal Achievement: 01/29/21 Potential to Achieve Goals: Good  OT Frequency: Min 2X/week   Barriers to D/C:            Co-evaluation              AM-PAC OT "6 Clicks" Daily Activity     Outcome Measure Help from another person eating meals?: None Help from another person taking care of personal grooming?: A Little Help from another person toileting, which includes using toliet, bedpan, or urinal?: A Little Help from another person bathing (including washing, rinsing, drying)?: A Little Help from another person to put on and taking off regular upper body clothing?: None Help from another  person to put on and taking off regular lower body clothing?: A Little 6 Click Score: 20   End of Session Equipment Utilized During Treatment: Gait belt;Rolling walker Nurse Communication: Mobility status;Patient requests pain meds  Activity Tolerance: Patient tolerated treatment well Patient left: in chair;with call bell/phone within reach  OT Visit Diagnosis: Unsteadiness on feet (R26.81);Other abnormalities of gait and mobility (R26.89);Pain Pain - Right/Left: Right Pain - part of body: Ankle and joints of foot;Leg                Time: 0827-0900 OT Time Calculation (min): 33 min Charges:  OT General  Charges $OT Visit: 1 Visit OT Evaluation $OT Eval Moderate Complexity: 1 Mod OT Treatments $Self Care/Home Management : 8-22 mins  Bradd Canary, OTR/L Acute Rehab Services Office: [phone]   Lorre Munroe 01/15/2021, 9:21 AM

## 2021-01-15 NOTE — Plan of Care (Signed)
Patient is s/p left above-knee popliteal to posterior tibial bypass. Will evaluate today for possible need for debridement of ulcerations; if needed can plan for debridement tomorrow. Continue vascular post-op reccs.  Park Liter, DPM

## 2021-01-15 NOTE — Progress Notes (Signed)
PHARMACIST LIPID MONITORING   Tony Hicks is a 55 y.o. male admitted on 01/08/2021 with L foot pain.  Pharmacy has been consulted to optimize lipid-lowering therapy with the indication of secondary prevention for clinical ASCVD.  Recent Labs:  Lipid Panel (last 6 months):   Lab Results  Component Value Date   CHOL 114 01/15/2021   TRIG 35 01/15/2021   HDL 45 01/15/2021   CHOLHDL 2.5 01/15/2021   VLDL 7 01/15/2021   LDLCALC 62 01/15/2021    Hepatic function panel (last 6 months):   Lab Results  Component Value Date   AST 20 01/10/2021   ALT 15 01/10/2021   ALKPHOS 44 01/10/2021   BILITOT 0.6 01/10/2021    SCr (since admission):   Serum creatinine: 0.63 mg/dL 41/66/06 3016 Estimated creatinine clearance: 110.5 mL/min  Current therapy and lipid therapy tolerance Current lipid-lowering therapy: atorvastatin 40 Previous lipid-lowering therapies (if applicable): none Documented or reported allergies or intolerances to lipid-lowering therapies (if applicable): none  Assessment:   Patient agrees with changes to lipid-lowering therapy - was started on atorvastatin 40 mg this admission  Plan:    1.Statin intensity (high intensity recommended for all patients regardless of the LDL):  Add or increase statin to high intensity.  2.Add ezetimibe (if any one of the following):   Not indicated at this time.  3.Refer to lipid clinic:   No  4.Follow-up with:  Primary care provider - Rema Fendt, NP  5.Follow-up labs after discharge:  Changes in lipid therapy were made. Check a lipid panel in 8-12 weeks then annually.      Sherron Monday, PharmD, BCCCP Clinical Pharmacist  Phone: [phone] 01/15/2021 9:30 AM  Please check AMION for all [facility] Pharmacy phone numbers After 10:00 PM, call Main Pharmacy [phone]

## 2021-01-15 NOTE — Progress Notes (Signed)
Inpatient Diabetes Program Recommendations  AACE/ADA: New Consensus Statement on Inpatient Glycemic Control (2015)  Target Ranges:  Prepandial:   less than 140 mg/dL      Peak postprandial:   less than 180 mg/dL (1-2 hours)      Critically ill patients:  140 - 180 mg/dL   Lab Results  Component Value Date   GLUCAP 131 (H) 01/15/2021   HGBA1C 6.2 (H) 01/08/2021    Review of Glycemic Control  Diabetes history: Pre DM Outpatient Diabetes medications: Metformin 500 mg Daily Current orders for Inpatient glycemic control:  Novolog 0-9 units tid  Spoke with pt over the phone regarding A1c and glucose control at home. Pt was on metformin prior to admission. Pt worked in [employer]. Pt has a plan to stop smoking, drinking, checking his glucose when he gets home. Pt reports going to a rehab facility that accepts pts without insurance.   Will give glucose meter to RN to give to pt since he does not have insurance or income.  Thanks,  Christena Deem RN, MSN, BC-ADM Inpatient Diabetes Coordinator Team Pager [phone] (8a-5p)

## 2021-01-15 NOTE — Progress Notes (Signed)
Pharmacy Antibiotic Note  Tony Hicks is a 55 y.o. male admitted on 01/08/2021 with Sepsis secondary to diabetic foot ulcer with cellulitis. Pharmacy has been consulted for Vancomycin dosing.  Assessment: WBC 13.4 (received dexamethasone yesterday in OR). Scr stable. Afebrile. S/p L A popliteal artery to posterior tibial artery bypass 7/11- being evaluated for need of debridement of ulcerations by podiatry.   Plan: Continue vancomycin 1500 mg IV every 12 hours for predicted AUC of 501.4 Monitor Scr, cx results, clinical pic, and for abx LOT   Height: 5\' 6"  (167.6 cm) Weight: 89.3 kg (196 lb 13.9 oz) IBW/kg (Calculated) : 63.8 Dosing weight: 84 kg Scr: 0.75  Temp (24hrs), Avg:98.1 F (36.7 C), Min:97.4 F (36.3 C), Max:99 F (37.2 C)  Recent Labs  Lab 01/08/21 0935 01/08/21 1244 01/08/21 1515 01/10/21 0638 01/11/21 0721 01/11/21 1840 01/11/21 2033 01/12/21 0634 01/14/21 0059 01/15/21 0500  WBC 7.8  --   --  5.4 5.6 5.6  --   --  6.8 13.4*  CREATININE 0.85  --   --  0.87  --  0.75  --   --  0.71 0.63  LATICACIDVEN  --  3.0* 1.6  --   --   --   --   --   --   --   VANCOTROUGH  --   --   --   --   --   --   --  5*  --   --   VANCOPEAK  --   --   --   --   --   --  26*  --   --   --      Estimated Creatinine Clearance: 110.5 mL/min (by C-G formula based on SCr of 0.63 mg/dL).    No Known Allergies  Antimicrobials this admission: Ceftriaxone 7/5 >>  Vancomycin 7/7 >>   Dose adjustments this admission: 7/9: Vanc increased from 1g q 12 to 1500mg  q 12  Microbiology results: 7/5 BCx: NG No MRSA PCR performed COVID + 7/5  Thank you for allowing pharmacy to be a part of this patient's care.  9/5, PharmD, BCCCP Clinical Pharmacist  Phone: [phone] 01/15/2021 9:12 AM  Please check AMION for all [facility] Pharmacy phone numbers After 10:00 PM, call Main Pharmacy [phone]

## 2021-01-15 NOTE — Progress Notes (Signed)
PT Cancellation Note  Patient Details Name: Tony Hicks MRN: [id number] DOB: 1966/02/26   Cancelled Treatment:    Reason Eval/Treat Not Completed: Fatigue/lethargy limiting ability to participate. Patient sleeping with head under blanket, declines getting up at this time. Will re-attempt tomorrow.    Luie Laneve 01/15/2021, 2:09 PM

## 2021-01-16 LAB — CBC
HCT: 26 % — ABNORMAL LOW (ref 39.0–52.0)
Hemoglobin: 7.9 g/dL — ABNORMAL LOW (ref 13.0–17.0)
MCH: 20.6 pg — ABNORMAL LOW (ref 26.0–34.0)
MCHC: 30.4 g/dL (ref 30.0–36.0)
MCV: 67.9 fL — ABNORMAL LOW (ref 80.0–100.0)
Platelets: 315 10*3/uL (ref 150–400)
RBC: 3.83 MIL/uL — ABNORMAL LOW (ref 4.22–5.81)
RDW: 24.4 % — ABNORMAL HIGH (ref 11.5–15.5)
WBC: 9.4 10*3/uL (ref 4.0–10.5)
nRBC: 0 % (ref 0.0–0.2)

## 2021-01-16 LAB — GLUCOSE, CAPILLARY
Glucose-Capillary: 114 mg/dL — ABNORMAL HIGH (ref 70–99)
Glucose-Capillary: 102 mg/dL — ABNORMAL HIGH (ref 70–99)
Glucose-Capillary: 134 mg/dL — ABNORMAL HIGH (ref 70–99)

## 2021-01-16 LAB — BASIC METABOLIC PANEL
Anion gap: 10 (ref 5–15)
BUN: 9 mg/dL (ref 6–20)
CO2: 24 mmol/L (ref 22–32)
Calcium: 9.1 mg/dL (ref 8.9–10.3)
Chloride: 99 mmol/L (ref 98–111)
Creatinine, Ser: 0.7 mg/dL (ref 0.61–1.24)
GFR, Estimated: >60 mL/min (ref >60)
Glucose, Bld: 103 mg/dL — ABNORMAL HIGH (ref 70–99)
Potassium: 3.9 mmol/L (ref 3.5–5.1)
Sodium: 133 mmol/L — ABNORMAL LOW (ref 135–145)

## 2021-01-16 LAB — SEDIMENTATION RATE: Sed Rate: 57 mm/hr — ABNORMAL HIGH (ref 0–16)

## 2021-01-16 LAB — C-REACTIVE PROTEIN: CRP: 11 mg/dL — ABNORMAL HIGH (ref <1.0)

## 2021-01-16 MED ORDER — DOXYCYCLINE HYCLATE 100 MG PO TABS
100.0000 mg | ORAL_TABLET | Freq: Two times a day (BID) | ORAL | Status: DC
  Administered 2021-01-16 – 2021-01-19 (×6): 100 mg via ORAL
  Filled 2021-01-16 (×6): qty 1

## 2021-01-16 MED ORDER — AMOXICILLIN-POT CLAVULANATE 875-125 MG PO TABS
1.0000 | ORAL_TABLET | Freq: Two times a day (BID) | ORAL | Status: DC
  Administered 2021-01-16 – 2021-01-19 (×6): 1 via ORAL
  Filled 2021-01-16 (×6): qty 1

## 2021-01-16 MED ORDER — COLLAGENASE 250 UNIT/GM EX OINT
TOPICAL_OINTMENT | Freq: Every day | CUTANEOUS | Status: DC
  Filled 2021-01-16 (×2): qty 30

## 2021-01-16 NOTE — Progress Notes (Signed)
Nutrition Follow-up  DOCUMENTATION CODES:   Not applicable  INTERVENTION:   -Continue Juven Fruit Punch BID, each serving provides 95kcal and 2.5g of protein (amino acids glutamine and arginine) -Continue Ensure Max po daily, each supplement provides 150 kcal and 30 grams of protein -Continue MVI with minerals daily   NUTRITION DIAGNOSIS:   Increased nutrient needs related to wound healing as evidenced by estimated needs.  Ongoing  GOAL:   Patient will meet greater than or equal to 90% of their needs  Meeting   MONITOR:   PO intake, Supplement acceptance, Labs, Weight trends, Skin, I & O's  REASON FOR ASSESSMENT:   Consult Wound healing  ASSESSMENT:   Tony Hicks is a 55 y.o. male with medical history significant of diabetes mellitus type II and polysubstance abuse(tobacco, alcohol, and cocaine) presents with complaints of left foot pain.  7/08- s/p aortogram. L lower extremity angiogram 7/11- s/p L popliteal artery to tibial artery bypass   Intake remains stable. Last five meal completions charted as 100%. Taking Ensure MAX and Juven. Continue current interventions.   Admission weight: 89.3 kg   Medications: colace, folic acid, SS novolog, thiamine  Labs: Na 133 (L) CBG 102-143  Diet Order:   Diet Order             Diet Heart Room service appropriate? Yes; Fluid consistency: Thin  Diet effective now                   EDUCATION NEEDS:   No education needs have been identified at this time  Skin:  Skin Assessment: Skin Integrity Issues: Skin Integrity Issues:: Diabetic Ulcer Diabetic Ulcer: lt foot Incision: L leg  Last BM:  7/8  Height:   Ht Readings from Last 1 Encounters:  01/12/21 5\' 6"  (1.676 m)    Weight:   Wt Readings from Last 1 Encounters:  01/12/21 89.3 kg    Ideal Body Weight:  64.5 kg  BMI:  Body mass index is 31.78 kg/m.  Estimated Nutritional Needs:   Kcal:  1950-2150  Protein:  100-115 grams  Fluid:  > 1.9  L  Shantanu Strauch MS, RD, LDN, CNSC Clinical Nutrition Pager listed in AMION

## 2021-01-16 NOTE — Progress Notes (Signed)
PROGRESS NOTE    Tony Hicks  JDB:[id number] DOB: Dec 02, 1965 DOA: 01/08/2021 PCP: [name], NP  Chief Complaint  Patient presents with   Wound Check    Brief Narrative:  55 year old male with DM2, polysubstance abuse (tobacco, alcohol, cocaine) who comes in with worsening left foot pain.  He has several ulcers there present for the last 2 months, seen by podiatry but overall without improvement.  Pain is gotten worse, ulcers have been getting larger and he has noticed some purulent discharges.  He eventually came to the ER and was admitted to the hospital.  He was found to have COVID-19, incidental, without symptoms.  Podiatry and vascular surgery consulted, he has significant PAD and he is status post left above-knee popliteal artery to posterior tibial artery bypass at the ankle with reversed ipsilateral great saphenous vein on 7/11 by Dr [name].   Assessment & Plan:   Principal Problem:   Diabetic foot ulcer ([place]) Active Problems:   Polysubstance abuse ([place])   Hyponatremia   Microcytic anemia   Diabetes mellitus type 2, controlled ([place])   PAD (peripheral artery disease) ([facility])  Sepsis secondary to diabetic foot ulcer with cellulitis-febrile in the ED, was tachycardic, had elevated lactic acid with likely source from his diabetic foot ulcer.  Podiatry was consulted on admission. -ABI showed severe vascular disease, vascular surgery consulted. Underwent arteriogram on 7/8 with popliteal artery occlusion on the left lower extremity behind the knee, and was taken to OR 7/11 for left above-knee popliteal to posterior tibial artery bypass. -CT scan of the foot with ulceration of the lateral aspect of the heel but no underlying abscess, osteomyelitis or anything else acute - elevated ESR/CRP - transitioned to oral abx 7/13 - need to consider duration -Podiatry planning to follow outpatient, planning enzymatic debridement at this time -PT/OT consultation pending  COVID-19-remains  asymptomatic.  Needs 10 days of isolation from 7/5 when he was first diagnosed   Type 2 diabetes mellitus, with hyperglycemia-A1c 6.2.  Patient was placed on sliding scale.  Monitor CBGs  Microcytic anemia, iron deficiency anemia-monitor, received IV iron, will place on oral iron on discharge.  Hemoglobin 7.8, stable from yesterday.  No bleeding, transfuse if less than 7  Hyponatremia-monitor, query related to EtOH.  Overall stable   Polysubstance abuse-Patient admits to drinking significant amount of alcohol most days, smoking tobacco, and snorting cocaine recently. CIWA protocols initiated, does not appear to be withdrawing  DVT prophylaxis: lovenox Code Status: full  Family Communication: none at bedside Disposition:   Status is: Inpatient  Remains inpatient appropriate because:Inpatient level of care appropriate due to severity of illness  Dispo: The patient is from: Home              Anticipated d/c is to:  [place]              Patient currently is not medically stable to d/c.   Difficult to place patient No       Consultants:  vascular  Procedures:  7/11 Procedure: 1.  Harvest of left leg great saphenous vein 2.  Left above-knee popliteal artery to posterior tibial artery bypass at the ankle with reversed ipsilateral great saphenous vein  PROCEDURE:   1) US guided right common femoral artery access 2) Aortogram 3) left lower extremity angiogram with second order cannulation (64mL total contrast) 4) Conscious sedation (31 minutes)  LLE arterial duplex Summary:  Left: Stenosis noted mid SFA and distal FA/proximal popliteal artery. Mid  to distal popliteal  artery appears occluded. Minimal flow noted in the AT  and PT arteries.   ABI  Summary:  Right: Resting right ankle-brachial index indicates noncompressible right  lower extremity arteries. The right toe-brachial index is abnormal.   Left: Resting left ankle-brachial index indicates severe left lower   extremity arterial disease. The left toe-brachial index is absent.    Antimicrobials:  Anti-infectives (From admission, onward)    Start     Dose/Rate Route Frequency Ordered Stop   01/16/21 2200  amoxicillin-clavulanate (AUGMENTIN) 875-125 MG per tablet 1 tablet        1 tablet Oral Every 12 hours 01/16/21 2010     01/16/21 2200  doxycycline (VIBRA-TABS) tablet 100 mg        100 mg Oral Every 12 hours 01/16/21 2010     01/12/21 1600  vancomycin (VANCOREADY) IVPB 1500 mg/300 mL  Status:  Discontinued        1,500 mg 150 mL/hr over 120 Minutes Intravenous Every 12 hours 01/12/21 0833 01/16/21 2010   01/10/21 0600  vancomycin (VANCOREADY) IVPB 1000 mg/200 mL  Status:  Discontinued        1,000 mg 200 mL/hr over 60 Minutes Intravenous Every 12 hours 01/09/21 1345 01/12/21 0833   01/09/21 1345  vancomycin (VANCOREADY) IVPB 1500 mg/300 mL        1,500 mg 150 mL/hr over 120 Minutes Intravenous  Once 01/09/21 1246 01/09/21 1635   01/08/21 1400  cefTRIAXone (ROCEPHIN) 2 g in sodium chloride 0.9 % 100 mL IVPB  Status:  Discontinued        2 g 200 mL/hr over 30 Minutes Intravenous Every 24 hours 01/08/21 1345 01/16/21 2010          Subjective: No new complaints, notes LLE discomfort  Objective: Vitals:   01/16/21 0839 01/16/21 1208 01/16/21 1614 01/16/21 1929  BP: (!) 103/57 108/75 116/67 130/75  Pulse: 88 77 82 82  Resp: $Remo'18 20 19 18  'PmGbP$ Temp: 98.5 F (36.9 C) 98.6 F (37 C) 98.7 F (37.1 C) 98.2 F (36.8 C)  TempSrc: Oral Oral Oral Oral  SpO2: 98% 100% 100% 100%  Weight:      Height:        Intake/Output Summary (Last 24 hours) at 01/16/2021 2011 Last data filed at 01/16/2021 1214 Gross per 24 hour  Intake 820 ml  Output 1100 ml  Net -280 ml   Filed Weights   01/09/21 1200 01/12/21 1554  Weight: 83.9 kg 89.3 kg    Examination:  General exam: Appears calm and comfortable  Respiratory system: Clear to auscultation. Respiratory effort normal. Cardiovascular system:  S1 & S2 heard, RRR.  Gastrointestinal system: Abdomen is nondistended, soft and nontender. Central nervous system: Alert and oriented. No focal neurological deficits. Extremities: ulceration/wounds to L heel and LLE Psychiatry: Judgement and insight appear normal. Mood & affect appropriate.     Data Reviewed: I have personally reviewed following labs and imaging studies  CBC: Recent Labs  Lab 01/11/21 0721 01/11/21 1840 01/14/21 0059 01/15/21 0500 01/16/21 0229  WBC 5.6 5.6 6.8 13.4* 9.4  HGB 7.9* 8.1* 7.9* 7.8* 7.9*  HCT 25.7* 27.2* 26.1* 25.3* 26.0*  MCV 63.8* 64.3* 66.2* 66.2* 67.9*  PLT 340 352 351 322 620    Basic Metabolic Panel: Recent Labs  Lab 01/10/21 0638 01/11/21 1840 01/14/21 0059 01/15/21 0500 01/16/21 0634  NA 133*  --  134* 134* 133*  K 4.1  --  4.1 4.2 3.9  CL 100  --  102 103 99  CO2 26  --  $R'25 23 24  'VY$ GLUCOSE 99  --  93 95 103*  BUN <5*  --  $R'12 7 9  'KA$ CREATININE 0.87 0.75 0.71 0.63 0.70  CALCIUM 8.8*  --  9.1 8.9 9.1    GFR: Estimated Creatinine Clearance: 110.5 mL/min (by C-G formula based on SCr of 0.7 mg/dL).  Liver Function Tests: Recent Labs  Lab 01/10/21 0638  AST 20  ALT 15  ALKPHOS 44  BILITOT 0.6  PROT 6.7  ALBUMIN 2.9*    CBG: Recent Labs  Lab 01/15/21 1723 01/15/21 2026 01/16/21 0557 01/16/21 1211 01/16/21 1615  GLUCAP 143* 112* 114* 102* 134*     Recent Results (from the past 240 hour(s))  Culture, blood (routine x 2)     Status: None   Collection Time: 01/08/21 12:44 PM   Specimen: BLOOD  Result Value Ref Range Status   Specimen Description BLOOD RIGHT ANTECUBITAL  Final   Special Requests   Final    BOTTLES DRAWN AEROBIC AND ANAEROBIC Blood Culture results may not be optimal due to an inadequate volume of blood received in culture bottles   Culture   Final    NO GROWTH 5 DAYS Performed at [facility], [place] [address], [place], [place] [address]    Report Status 01/13/2021 FINAL  Final  Culture,  blood (routine x 2)     Status: None   Collection Time: 01/08/21 12:51 PM   Specimen: BLOOD  Result Value Ref Range Status   Specimen Description BLOOD RIGHT ANTECUBITAL  Final   Special Requests   Final    BOTTLES DRAWN AEROBIC AND ANAEROBIC Blood Culture results may not be optimal due to an inadequate volume of blood received in culture bottles   Culture   Final    NO GROWTH 5 DAYS Performed at [facility], [place] [address] [place], [place] [address]    Report Status 01/13/2021 FINAL  Final  SARS CORONAVIRUS 2 (TAT 6-24 HRS) Nasopharyngeal Nasopharyngeal Swab     Status: Abnormal   Collection Time: 01/08/21  3:26 PM   Specimen: Nasopharyngeal Swab  Result Value Ref Range Status   SARS Coronavirus 2 POSITIVE (Dionne Knoop) NEGATIVE Final    Comment: (NOTE) SARS-CoV-2 target nucleic acids are DETECTED.  The SARS-CoV-2 RNA is generally detectable in upper and lower respiratory specimens during the acute phase of infection. Positive results are indicative of the presence of SARS-CoV-2 RNA. Clinical correlation with patient history and other diagnostic information is  necessary to determine patient infection status. Positive results do not rule out bacterial infection or co-infection with other viruses.  The expected result is Negative.  Fact Sheet for Patients: SugarRoll.be  Fact Sheet for Healthcare Providers: https://www.woods-mathews.com/  This test is not yet approved or cleared by the [place] FDA and  has been authorized for detection and/or diagnosis of SARS-CoV-2 by FDA under an Emergency Use Authorization (EUA). This EUA will remain  in effect (meaning this test can be used) for the duration of the COVID-19 declaration under Section 564(b)(1) of the Act, 21 U. S.C. section 360bbb-3(b)(1), unless the authorization is terminated or revoked sooner.   Performed at [facility], [place] [address] [place], [place] [address]           Radiology Studies: No results found.      Scheduled Meds:  amoxicillin-clavulanate  1 tablet Oral Q12H   aspirin EC  81 mg Oral Daily  atorvastatin  40 mg Oral Daily   collagenase   Topical Daily   docusate sodium  100 mg Oral Daily   doxycycline  100 mg Oral Q12H   enoxaparin (LOVENOX) injection  40 mg Subcutaneous D66Y   folic acid  1 mg Oral Daily   gabapentin  300 mg Oral BID   insulin aspart  0-9 Units Subcutaneous TID WC   living well with diabetes book   Does not apply Once   multivitamin with minerals  1 tablet Oral Daily   nicotine  14 mg Transdermal Daily   nutrition supplement (JUVEN)  1 packet Oral BID BM   pantoprazole  40 mg Oral Daily   Ensure Max Protein  11 oz Oral QHS   sodium chloride flush  3 mL Intravenous Q12H   thiamine  100 mg Oral Daily   Or   thiamine  100 mg Intravenous Daily   Continuous Infusions:  sodium chloride     sodium chloride     sodium chloride     magnesium sulfate bolus IVPB       LOS: 8 days    Time spent: over 30 min    Fayrene Helper, MD [facility]   To contact the attending provider between 7A-7P or the covering provider during after hours 7P-7A, please log into the web site [email or web address] and access using universal [place] password for that web site. If you do not have the password, please call the hospital operator.  01/16/2021, 8:11 PM

## 2021-01-16 NOTE — Progress Notes (Addendum)
  Progress Note    01/16/2021 7:54 AM 2 Days Post-Op  Subjective:  says breakfast isn't very good, otherwise, no complaints  afebrile  Vitals:   01/15/21 2339 01/16/21 0553  BP: (!) 97/56 (!) 141/106  Pulse: 78 78  Resp: 19 20  Temp: 98.6 F (37 C) 98.8 F (37.1 C)  SpO2: 97% 98%    Physical Exam: Cardiac:  regular Lungs:  non labored Incisions:  clean and dry Extremities:  brisk doppler signal left PT   CBC    Component Value Date/Time   WBC 9.4 01/16/2021 0229   RBC 3.83 (L) 01/16/2021 0229   HGB 7.9 (L) 01/16/2021 0229   HGB 12.5 (L) 02/17/2019 1543   HCT 26.0 (L) 01/16/2021 0229   HCT 37.7 02/17/2019 1543   PLT 315 01/16/2021 0229   PLT 249 02/17/2019 1543   MCV 67.9 (L) 01/16/2021 0229   MCV 82 02/17/2019 1543   MCH 20.6 (L) 01/16/2021 0229   MCHC 30.4 01/16/2021 0229   RDW 24.4 (H) 01/16/2021 0229   RDW 13.7 02/17/2019 1543   LYMPHSABS 0.5 (L) 01/08/2021 0935   LYMPHSABS 1.6 02/17/2019 1543   MONOABS 0.9 01/08/2021 0935   EOSABS 0.1 01/08/2021 0935   EOSABS 0.1 02/17/2019 1543   BASOSABS 0.1 01/08/2021 0935   BASOSABS 0.0 02/17/2019 1543    BMET    Component Value Date/Time   NA 133 (L) 01/16/2021 0634   NA 140 02/17/2019 1543   K 3.9 01/16/2021 0634   CL 99 01/16/2021 0634   CO2 24 01/16/2021 0634   GLUCOSE 103 (H) 01/16/2021 0634   BUN 9 01/16/2021 0634   BUN 8 02/17/2019 1543   CREATININE 0.70 01/16/2021 0634   CALCIUM 9.1 01/16/2021 0634   GFRNONAA >60 01/16/2021 0634   GFRAA 101 02/17/2019 1543    INR No results found for: INR   Intake/Output Summary (Last 24 hours) at 01/16/2021 0754 Last data filed at 01/16/2021 0259 Gross per 24 hour  Intake 100 ml  Output 400 ml  Net -300 ml     Assessment/Plan:  55 y.o. male is s/p:  left above-knee popliteal to posterior tibial bypass at the ankle for critical limb ischemia with tissue loss.  2 Days Post-Op   -pt with brisk left PT doppler signals and incisions look fine.   Continue to float heel.  -acute blood loss anemia is stable with hgb 7.9 -leukocytosis improved to 9.4 from 13.4   Doreatha Massed, PA-C [facility] [phone] 01/16/2021 7:54 AM  I have seen and evaluated the patient. I agree with the PA note as documented above.  Postop day 2 status post left above-knee popliteal to PT bypass at the ankle.  Bypass has a pulse in the graft and a brisk PT signal at the ankle.  I did discuss that even with revascularization given the extent of his heel wound he may not be able to heal this completely.  Podiatry is following and looks like he has follow-up with them as an outpatient with plans for enzymatic debridement.  Can move to her discharge from my standpoint looks like OT recommended no follow-up and PT will come back to see him today.  Aspirin statin for my standpoint.  Cephus Shelling, MD [facility] [place] Office: [phone]

## 2021-01-16 NOTE — Evaluation (Signed)
Physical Therapy Evaluation Patient Details Name: Tony Hicks MRN: [id number] DOB: 1965-08-23 Today's Date: 01/16/2021   History of Present Illness  Tony Hicks is a 54 y.o. male with left foot pain and nonhealing wounds. Pt noted with sepsis, cellulitis and incidental COVID-19+ finding. Pt underwent L popliteal to posterior tibial bypass on 7/11. PMH: DM2, polysubstance abuse (tobacco, alcohol, cocaine)   Clinical Impression  Patient received in bed, wants to sleep having a tough time getting sleep here. He is agreeable to PT assessment. Patient is mod independent with bed mobility and supervision for transfers. He is pain limited and continues to be unable to put L LE on the floor for ambulation. Demonstrates safe mobility with walker hopping in room. Patient will continue to benefit from skilled PT while here to improve gait and functional independence.       Follow Up Recommendations No PT follow up    Equipment Recommendations  Rolling walker with 5" wheels    Recommendations for Other Services       Precautions / Restrictions Precautions Precautions: Fall;Other (comment) Precaution Comments: significant ulcer to L heel Restrictions Weight Bearing Restrictions: No Other Position/Activity Restrictions: no formal orders, but pt self NWB L LE due to pain and significant heel ulcer      Mobility  Bed Mobility Overal bed mobility: Modified Independent                  Transfers Overall transfer level: Needs assistance Equipment used: Rolling walker (2 wheeled) Transfers: Sit to/from Stand Sit to Stand: Supervision         General transfer comment: supervision for sit to stand, no LOB. min guard for turning/dynamic tasks using RW to ensure safety  Ambulation/Gait Ambulation/Gait assistance: Supervision Gait Distance (Feet): 20 Feet Assistive device: Rolling walker (2 wheeled) Gait Pattern/deviations: Step-to pattern Gait velocity: decr   General Gait  Details: patient is generally safe with mobility, unable to put left foot on floor due to pain.  Stairs            Wheelchair Mobility    Modified Rankin (Stroke Patients Only)       Balance Overall balance assessment: Modified Independent Sitting-balance support: Feet supported Sitting balance-Leahy Scale: Normal     Standing balance support: Bilateral upper extremity supported;During functional activity Standing balance-Leahy Scale: Good Standing balance comment: reliant on UE support due to self NWB to L LE, no lob using RW. Needed at this time for L LE pain.                             Pertinent Vitals/Pain Pain Assessment: Faces Faces Pain Scale: Hurts even more Pain Location: L foot Pain Descriptors / Indicators: Grimacing;Guarding;Sore Pain Intervention(s): Monitored during session;Repositioned    Home Living Family/patient expects to be discharged to:: Private residence Living Arrangements: Other relatives Available Help at Discharge: Family;Available PRN/intermittently Type of Home: House Home Access: Stairs to enter   Entrance Stairs-Number of Steps: 1 Home Layout: Two level;1/2 bath on main level;Able to live on main level with bedroom/bathroom Home Equipment: Gilmer Mor - single point Additional Comments: reports recently moving in with sister, one bedroom and half bath on main level where pt plans to stay. pt reports he does not plan on going up/down stairs inside the home    Prior Function Level of Independence: Independent         Comments: previously independent with ADLs, IADLs and mobility without use of  AD. 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     Hand Dominance   Dominant Hand: Right    Extremity/Trunk Assessment   Upper Extremity Assessment Upper Extremity Assessment: Defer to OT evaluation    Lower Extremity Assessment Lower Extremity Assessment: LLE deficits/detail LLE: Unable to fully assess  due to pain LLE Coordination: decreased gross motor    Cervical / Trunk Assessment Cervical / Trunk Assessment: Normal  Communication   Communication: No difficulties  Cognition Arousal/Alertness: Awake/alert Behavior During Therapy: WFL for tasks assessed/performed Overall Cognitive Status: Within Functional Limits for tasks assessed                                        General Comments      Exercises     Assessment/Plan    PT Assessment Patient needs continued PT services  PT Problem List Decreased strength;Decreased mobility;Decreased activity tolerance;Pain       PT Treatment Interventions DME instruction;Gait training;Therapeutic exercise;Functional mobility training;Therapeutic activities;Patient/family education    PT Goals (Current goals can be found in the Care Plan section)  Acute Rehab PT Goals Patient Stated Goal: to decrease pain, go home PT Goal Formulation: With patient Time For Goal Achievement: 01/23/21 Potential to Achieve Goals: Good    Frequency Min 2X/week   Barriers to discharge        Co-evaluation               AM-PAC PT "6 Clicks" Mobility  Outcome Measure Help needed turning from your back to your side while in a flat bed without using bedrails?: None Help needed moving from lying on your back to sitting on the side of a flat bed without using bedrails?: None Help needed moving to and from a bed to a chair (including a wheelchair)?: A Little Help needed standing up from a chair using your arms (e.g., wheelchair or bedside chair)?: A Little Help needed to walk in hospital room?: A Little Help needed climbing 3-5 steps with a railing? : A Little 6 Click Score: 20    End of Session   Activity Tolerance: Patient tolerated treatment well;Patient limited by pain Patient left: in chair;with call bell/phone within reach Nurse Communication: Mobility status PT Visit Diagnosis: Difficulty in walking, not elsewhere  classified (R26.2);Pain Pain - part of body: Leg    Time: 4401-0272 PT Time Calculation (min) (ACUTE ONLY): 16 min   Charges:   PT Evaluation $PT Eval Moderate Complexity: 1 Mod          Loriana Samad, PT, GCS 01/16/21,11:00 AM

## 2021-01-16 NOTE — Progress Notes (Addendum)
  Subjective:  Patient ID: Tony Hicks, male    DOB: 04/24/1966,  MRN: [id number]  Patient seen bedside. Denies complaints. Underwent vascular intervention yesterday. Objective:   Vitals:   01/15/21 2339 01/16/21 0553  BP: (!) 97/56 (!) 141/106  Pulse: 78 78  Resp: 19 20  Temp: 98.6 F (37 C) 98.8 F (37.1 C)  SpO2: 97% 98%   General AA&O x3. Normal mood and affect.  Vascular Left foot and leg warm to touch.  Neurologic Epicritic sensation grossly intact.  Dermatologic Dry eschars left lateral ankle/leg. No purulence, no drainage, no bogginess. No fluctuance. Well appearing incisions to medial leg bypass site.  Orthopedic: Motor intact.    Assessment & Plan:  Patient was evaluated and treated and all questions answered.  Left ankle and foot wound, now s/p bypass -Wound dry with eschar -Given recent vascular intervention, plan for enzymatic debridement rather than sharp surgical debridement. Start santyl WTD daily. Orders placed. -Please have patient f/u at d/c with [facility] center for LLE ulcerations. -Podiatry to sign off. Please contact with questions or concerns.  Park Liter, DPM  Accessible via secure chat for questions or concerns.

## 2021-01-17 LAB — CBC WITH DIFFERENTIAL/PLATELET
Abs Immature Granulocytes: 0.03 10*3/uL (ref 0.00–0.07)
Basophils Absolute: 0.1 10*3/uL (ref 0.0–0.1)
Basophils Relative: 1 %
Eosinophils Absolute: 0.1 10*3/uL (ref 0.0–0.5)
Eosinophils Relative: 1 %
HCT: 25.2 % — ABNORMAL LOW (ref 39.0–52.0)
Hemoglobin: 7.7 g/dL — ABNORMAL LOW (ref 13.0–17.0)
Immature Granulocytes: 0 %
Lymphocytes Relative: 25 %
Lymphs Abs: 1.7 10*3/uL (ref 0.7–4.0)
MCH: 20.8 pg — ABNORMAL LOW (ref 26.0–34.0)
MCHC: 30.6 g/dL (ref 30.0–36.0)
MCV: 68.1 fL — ABNORMAL LOW (ref 80.0–100.0)
Monocytes Absolute: 0.7 10*3/uL (ref 0.1–1.0)
Monocytes Relative: 11 %
Neutro Abs: 4.2 10*3/uL (ref 1.7–7.7)
Neutrophils Relative %: 62 %
Platelets: 298 10*3/uL (ref 150–400)
RBC: 3.7 MIL/uL — ABNORMAL LOW (ref 4.22–5.81)
RDW: 24.9 % — ABNORMAL HIGH (ref 11.5–15.5)
WBC: 6.8 10*3/uL (ref 4.0–10.5)
nRBC: 0 % (ref 0.0–0.2)

## 2021-01-17 LAB — COMPREHENSIVE METABOLIC PANEL
ALT: 17 U/L (ref 0–44)
AST: 13 U/L — ABNORMAL LOW (ref 15–41)
Albumin: 2.7 g/dL — ABNORMAL LOW (ref 3.5–5.0)
Alkaline Phosphatase: 40 U/L (ref 38–126)
Anion gap: 7 (ref 5–15)
BUN: 13 mg/dL (ref 6–20)
CO2: 23 mmol/L (ref 22–32)
Calcium: 8.9 mg/dL (ref 8.9–10.3)
Chloride: 102 mmol/L (ref 98–111)
Creatinine, Ser: 0.72 mg/dL (ref 0.61–1.24)
GFR, Estimated: >60 mL/min (ref >60)
Glucose, Bld: 110 mg/dL — ABNORMAL HIGH (ref 70–99)
Potassium: 3.9 mmol/L (ref 3.5–5.1)
Sodium: 132 mmol/L — ABNORMAL LOW (ref 135–145)
Total Bilirubin: 0.6 mg/dL (ref 0.3–1.2)
Total Protein: 6.3 g/dL — ABNORMAL LOW (ref 6.5–8.1)

## 2021-01-17 LAB — GLUCOSE, CAPILLARY
Glucose-Capillary: 165 mg/dL — ABNORMAL HIGH (ref 70–99)
Glucose-Capillary: 112 mg/dL — ABNORMAL HIGH (ref 70–99)
Glucose-Capillary: 158 mg/dL — ABNORMAL HIGH (ref 70–99)
Glucose-Capillary: 163 mg/dL — ABNORMAL HIGH (ref 70–99)
Glucose-Capillary: 163 mg/dL — ABNORMAL HIGH (ref 70–99)

## 2021-01-17 LAB — PHOSPHORUS: Phosphorus: 4.3 mg/dL (ref 2.5–4.6)

## 2021-01-17 LAB — MAGNESIUM: Magnesium: 1.8 mg/dL (ref 1.7–2.4)

## 2021-01-17 NOTE — Progress Notes (Signed)
[facility]  Subjective  -overall states his foot feels better.   Objective 107/82 77 98.1 F (36.7 C) (Oral) 18 100%  Intake/Output Summary (Last 24 hours) at 01/17/2021 0910 Last data filed at 01/17/2021 0801 Gross per 24 hour  Intake --  Output 1650 ml  Net -1650 ml    Palpable pulse in left above-knee to PT bypass at the ankle Brisk left PT signal at the ankle Large heel ulcer and fourth toe ulcer  Laboratory Lab Results: Recent Labs    01/16/21 0229 01/17/21 0130  WBC 9.4 6.8  HGB 7.9* 7.7*  HCT 26.0* 25.2*  PLT 315 298   BMET Recent Labs    01/16/21 0634 01/17/21 0130  NA 133* 132*  K 3.9 3.9  CL 99 102  CO2 24 23  GLUCOSE 103* 110*  BUN 9 13  CREATININE 0.70 0.72  CALCIUM 9.1 8.9    COAG No results found for: INR, PROTIME No results found for: PTT  Assessment/Planning:  Postop day 3 status post left above-knee popliteal to PT bypass with vein for critical limb ischemia with tissue loss.  Still has a good pulse in the bypass graft just above the ankle with a brisk PT signal.  Aspirin statin from my standpoint.  Podiatry is following the foot wounds.  Okay for discharge from my standpoint and we will arrange follow-up in the office but looks like there will be some issues with placement due to social situation.  Tony Hicks 01/17/2021 9:10 AM --

## 2021-01-17 NOTE — Progress Notes (Signed)
PROGRESS NOTE    Tony Hicks  PVX:[id number] DOB: 09-Jan-1966 DOA: 01/08/2021 PCP: [name], NP  Chief Complaint  Patient presents with   Wound Check    Brief Narrative:  55 year old male with DM2, polysubstance abuse (tobacco, alcohol, cocaine) who comes in with worsening left foot pain.  He has several ulcers there present for the last 2 months, seen by podiatry but overall without improvement.  Pain is gotten worse, ulcers have been getting larger and he has noticed some purulent discharges.  He eventually came to the ER and was admitted to the hospital.  He was found to have COVID-19, incidental, without symptoms.  Podiatry and vascular surgery consulted, he has significant PAD and he is status post left above-knee popliteal artery to posterior tibial artery bypass at the ankle with reversed ipsilateral great saphenous vein on 7/11 by Dr [name].   Assessment & Plan:   Principal Problem:   Diabetic foot ulcer ([place]) Active Problems:   Polysubstance abuse ([place])   Hyponatremia   Microcytic anemia   Diabetes mellitus type 2, controlled ([place])   PAD (peripheral artery disease) ([facility])  Sepsis secondary to diabetic foot ulcer with cellulitis-febrile in the ED, was tachycardic, had elevated lactic acid with likely source from his diabetic foot ulcer.  Podiatry was consulted on admission. -ABI showed severe vascular disease, vascular surgery consulted. Underwent arteriogram on 7/8 with popliteal artery occlusion on the left lower extremity behind the knee, and was taken to OR 7/11 for left above-knee popliteal to posterior tibial artery bypass. -CT scan of the foot with ulceration of the lateral aspect of the heel but no underlying abscess, osteomyelitis or anything else acute - elevated ESR/CRP - will follow MRI left foot to clarify dx - transitioned to oral abx 7/13 - need to consider duration -Podiatry planning to follow outpatient, planning enzymatic debridement at this time -PT/OT  consultation pending  COVID-19-remains asymptomatic.  Needs 10 days of isolation from 7/5 when he was first diagnosed   Type 2 diabetes mellitus, with hyperglycemia-A1c 6.2.  Patient was placed on sliding scale.  Monitor CBGs  Microcytic anemia, iron deficiency anemia-monitor, received IV iron, will place on oral iron on discharge.  Hemoglobin 7.8, stable from yesterday.  No bleeding, transfuse if less than 7  Hyponatremia-monitor, query related to EtOH.  Overall stable   Polysubstance abuse-Patient admits to drinking significant amount of alcohol most days, smoking tobacco, and snorting cocaine recently. CIWA protocols initiated, does not appear to be withdrawing  DVT prophylaxis: lovenox Code Status: full  Family Communication: none at bedside Disposition:   Status is: Inpatient  Remains inpatient appropriate because:Inpatient level of care appropriate due to severity of illness  Dispo: The patient is from: Home              Anticipated d/c is to:  [place]              Patient currently is not medically stable to d/c.   Difficult to place patient No       Consultants:  vascular  Procedures:  7/11 Procedure: 1.  Harvest of left leg great saphenous vein 2.  Left above-knee popliteal artery to posterior tibial artery bypass at the ankle with reversed ipsilateral great saphenous vein  PROCEDURE:   1) US guided right common femoral artery access 2) Aortogram 3) left lower extremity angiogram with second order cannulation (61mL total contrast) 4) Conscious sedation (31 minutes)  LLE arterial duplex Summary:  Left: Stenosis noted mid SFA and  distal FA/proximal popliteal artery. Mid  to distal popliteal artery appears occluded. Minimal flow noted in the AT  and PT arteries.   ABI  Summary:  Right: Resting right ankle-brachial index indicates noncompressible right  lower extremity arteries. The right toe-brachial index is abnormal.   Left: Resting left ankle-brachial  index indicates severe left lower  extremity arterial disease. The left toe-brachial index is absent.    Antimicrobials:  Anti-infectives (From admission, onward)    Start     Dose/Rate Route Frequency Ordered Stop   01/16/21 2200  amoxicillin-clavulanate (AUGMENTIN) 875-125 MG per tablet 1 tablet        1 tablet Oral Every 12 hours 01/16/21 2010     01/16/21 2030  doxycycline (VIBRA-TABS) tablet 100 mg        100 mg Oral Every 12 hours 01/16/21 2010     01/12/21 1600  vancomycin (VANCOREADY) IVPB 1500 mg/300 mL  Status:  Discontinued        1,500 mg 150 mL/hr over 120 Minutes Intravenous Every 12 hours 01/12/21 0833 01/16/21 2010   01/10/21 0600  vancomycin (VANCOREADY) IVPB 1000 mg/200 mL  Status:  Discontinued        1,000 mg 200 mL/hr over 60 Minutes Intravenous Every 12 hours 01/09/21 1345 01/12/21 0833   01/09/21 1345  vancomycin (VANCOREADY) IVPB 1500 mg/300 mL        1,500 mg 150 mL/hr over 120 Minutes Intravenous  Once 01/09/21 1246 01/09/21 1635   01/08/21 1400  cefTRIAXone (ROCEPHIN) 2 g in sodium chloride 0.9 % 100 mL IVPB  Status:  Discontinued        2 g 200 mL/hr over 30 Minutes Intravenous Every 24 hours 01/08/21 1345 01/16/21 2010          Subjective: Continued L heel pain   Objective: Vitals:   01/16/21 2300 01/17/21 0757 01/17/21 0800 01/17/21 1227  BP: 110/65 107/82 107/82 (!) 109/57  Pulse: 77 77 74 75  Resp: $Remo'17 18 18 20  'WWwUI$ Temp: 98.4 F (36.9 C) 98.1 F (36.7 C)  98.3 F (36.8 C)  TempSrc: Oral Oral  Oral  SpO2: 99% 100% 100% 100%  Weight:      Height:        Intake/Output Summary (Last 24 hours) at 01/17/2021 1525 Last data filed at 01/17/2021 1228 Gross per 24 hour  Intake 240 ml  Output 1250 ml  Net -1010 ml   Filed Weights   01/09/21 1200 01/12/21 1554  Weight: 83.9 kg 89.3 kg    Examination:  General: No acute distress. Cardiovascular: RRR Lungs: unlabored Abdomen: Soft, nontender, nondistended Neurological: Alert and oriented  3. Moves all extremities 4. Cranial nerves II through XII grossly intact. Extremities: L heel ulceration, ischemic appearing wounds, foul smelling     Data Reviewed: I have personally reviewed following labs and imaging studies  CBC: Recent Labs  Lab 01/11/21 1840 01/14/21 0059 01/15/21 0500 01/16/21 0229 01/17/21 0130  WBC 5.6 6.8 13.4* 9.4 6.8  NEUTROABS  --   --   --   --  4.2  HGB 8.1* 7.9* 7.8* 7.9* 7.7*  HCT 27.2* 26.1* 25.3* 26.0* 25.2*  MCV 64.3* 66.2* 66.2* 67.9* 68.1*  PLT 352 351 322 315 950    Basic Metabolic Panel: Recent Labs  Lab 01/11/21 1840 01/14/21 0059 01/15/21 0500 01/16/21 0634 01/17/21 0130  NA  --  134* 134* 133* 132*  K  --  4.1 4.2 3.9 3.9  CL  --  102 103  99 102  CO2  --  $R'25 23 24 23  'TD$ GLUCOSE  --  93 95 103* 110*  BUN  --  $R'12 7 9 13  'Xf$ CREATININE 0.75 0.71 0.63 0.70 0.72  CALCIUM  --  9.1 8.9 9.1 8.9  MG  --   --   --   --  1.8  PHOS  --   --   --   --  4.3    GFR: Estimated Creatinine Clearance: 110.5 mL/min (by C-G formula based on SCr of 0.72 mg/dL).  Liver Function Tests: Recent Labs  Lab 01/17/21 0130  AST 13*  ALT 17  ALKPHOS 40  BILITOT 0.6  PROT 6.3*  ALBUMIN 2.7*    CBG: Recent Labs  Lab 01/16/21 1211 01/16/21 1615 01/16/21 2158 01/17/21 0557 01/17/21 1223  GLUCAP 102* 134* 165* 112* 158*     Recent Results (from the past 240 hour(s))  Culture, blood (routine x 2)     Status: None   Collection Time: 01/08/21 12:44 PM   Specimen: BLOOD  Result Value Ref Range Status   Specimen Description BLOOD RIGHT ANTECUBITAL  Final   Special Requests   Final    BOTTLES DRAWN AEROBIC AND ANAEROBIC Blood Culture results may not be optimal due to an inadequate volume of blood received in culture bottles   Culture   Final    NO GROWTH 5 DAYS Performed at [facility], [place] [address], [place], [place] [address]    Report Status 01/13/2021 FINAL  Final  Culture, blood (routine x 2)     Status: None   Collection  Time: 01/08/21 12:51 PM   Specimen: BLOOD  Result Value Ref Range Status   Specimen Description BLOOD RIGHT ANTECUBITAL  Final   Special Requests   Final    BOTTLES DRAWN AEROBIC AND ANAEROBIC Blood Culture results may not be optimal due to an inadequate volume of blood received in culture bottles   Culture   Final    NO GROWTH 5 DAYS Performed at [facility], [place] [address] [place], [place] [address]    Report Status 01/13/2021 FINAL  Final  SARS CORONAVIRUS 2 (TAT 6-24 HRS) Nasopharyngeal Nasopharyngeal Swab     Status: Abnormal   Collection Time: 01/08/21  3:26 PM   Specimen: Nasopharyngeal Swab  Result Value Ref Range Status   SARS Coronavirus 2 POSITIVE ([place]) NEGATIVE Final    Comment: (NOTE) SARS-CoV-2 target nucleic acids are DETECTED.  The SARS-CoV-2 RNA is generally detectable in upper and lower respiratory specimens during the acute phase of infection. Positive results are indicative of the presence of SARS-CoV-2 RNA. Clinical correlation with patient history and other diagnostic information is  necessary to determine patient infection status. Positive results do not rule out bacterial infection or co-infection with other viruses.  The expected result is Negative.  Fact Sheet for Patients: SugarRoll.be  Fact Sheet for Healthcare Providers: https://www.woods-mathews.com/  This test is not yet approved or cleared by the [place] FDA and  has been authorized for detection and/or diagnosis of SARS-CoV-2 by FDA under an Emergency Use Authorization (EUA). This EUA will remain  in effect (meaning this test can be used) for the duration of the COVID-19 declaration under Section 564(b)(1) of the Act, 21 U. S.C. section 360bbb-3(b)(1), unless the authorization is terminated or revoked sooner.   Performed at [facility], [place] [address], [place], [place] [address]          Radiology  Studies: No results  found.      Scheduled Meds:  amoxicillin-clavulanate  1 tablet Oral Q12H   aspirin EC  81 mg Oral Daily   atorvastatin  40 mg Oral Daily   collagenase   Topical Daily   docusate sodium  100 mg Oral Daily   doxycycline  100 mg Oral Q12H   enoxaparin (LOVENOX) injection  40 mg Subcutaneous V15W   folic acid  1 mg Oral Daily   gabapentin  300 mg Oral BID   insulin aspart  0-9 Units Subcutaneous TID WC   living well with diabetes book   Does not apply Once   multivitamin with minerals  1 tablet Oral Daily   nicotine  14 mg Transdermal Daily   nutrition supplement (JUVEN)  1 packet Oral BID BM   pantoprazole  40 mg Oral Daily   Ensure Max Protein  11 oz Oral QHS   sodium chloride flush  3 mL Intravenous Q12H   thiamine  100 mg Oral Daily   Or   thiamine  100 mg Intravenous Daily   Continuous Infusions:  sodium chloride     sodium chloride     sodium chloride     magnesium sulfate bolus IVPB       LOS: 9 days    Time spent: over 30 min    Fayrene Helper, MD [facility]   To contact the attending provider between 7A-7P or the covering provider during after hours 7P-7A, please log into the web site [email or web address] and access using universal [place] password for that web site. If you do not have the password, please call the hospital operator.  01/17/2021, 3:25 PM

## 2021-01-18 ENCOUNTER — Inpatient Hospital Stay (HOSPITAL_COMMUNITY): Payer: Self-pay

## 2021-01-18 LAB — CBC WITH DIFFERENTIAL/PLATELET
Abs Immature Granulocytes: 0.03 10*3/uL (ref 0.00–0.07)
Basophils Absolute: 0 10*3/uL (ref 0.0–0.1)
Basophils Relative: 1 %
Eosinophils Absolute: 0.1 10*3/uL (ref 0.0–0.5)
Eosinophils Relative: 1 %
HCT: 25.1 % — ABNORMAL LOW (ref 39.0–52.0)
Hemoglobin: 7.4 g/dL — ABNORMAL LOW (ref 13.0–17.0)
Immature Granulocytes: 0 %
Lymphocytes Relative: 24 %
Lymphs Abs: 1.7 10*3/uL (ref 0.7–4.0)
MCH: 20.2 pg — ABNORMAL LOW (ref 26.0–34.0)
MCHC: 29.5 g/dL — ABNORMAL LOW (ref 30.0–36.0)
MCV: 68.6 fL — ABNORMAL LOW (ref 80.0–100.0)
Monocytes Absolute: 0.7 10*3/uL (ref 0.1–1.0)
Monocytes Relative: 9 %
Neutro Abs: 4.6 10*3/uL (ref 1.7–7.7)
Neutrophils Relative %: 65 %
Platelets: 378 10*3/uL (ref 150–400)
RBC: 3.66 MIL/uL — ABNORMAL LOW (ref 4.22–5.81)
RDW: 25 % — ABNORMAL HIGH (ref 11.5–15.5)
WBC: 7.2 10*3/uL (ref 4.0–10.5)
nRBC: 0 % (ref 0.0–0.2)

## 2021-01-18 LAB — COMPREHENSIVE METABOLIC PANEL
ALT: 17 U/L (ref 0–44)
AST: 13 U/L — ABNORMAL LOW (ref 15–41)
Albumin: 2.8 g/dL — ABNORMAL LOW (ref 3.5–5.0)
Alkaline Phosphatase: 38 U/L (ref 38–126)
Anion gap: 7 (ref 5–15)
BUN: 14 mg/dL (ref 6–20)
CO2: 25 mmol/L (ref 22–32)
Calcium: 9.1 mg/dL (ref 8.9–10.3)
Chloride: 100 mmol/L (ref 98–111)
Creatinine, Ser: 0.61 mg/dL (ref 0.61–1.24)
GFR, Estimated: >60 mL/min (ref >60)
Glucose, Bld: 129 mg/dL — ABNORMAL HIGH (ref 70–99)
Potassium: 3.7 mmol/L (ref 3.5–5.1)
Sodium: 132 mmol/L — ABNORMAL LOW (ref 135–145)
Total Bilirubin: 0.1 mg/dL — ABNORMAL LOW (ref 0.3–1.2)
Total Protein: 6.4 g/dL — ABNORMAL LOW (ref 6.5–8.1)

## 2021-01-18 LAB — GLUCOSE, CAPILLARY
Glucose-Capillary: 113 mg/dL — ABNORMAL HIGH (ref 70–99)
Glucose-Capillary: 120 mg/dL — ABNORMAL HIGH (ref 70–99)
Glucose-Capillary: 107 mg/dL — ABNORMAL HIGH (ref 70–99)
Glucose-Capillary: 120 mg/dL — ABNORMAL HIGH (ref 70–99)

## 2021-01-18 LAB — PHOSPHORUS: Phosphorus: 4.3 mg/dL (ref 2.5–4.6)

## 2021-01-18 LAB — MAGNESIUM: Magnesium: 1.9 mg/dL (ref 1.7–2.4)

## 2021-01-18 MED ORDER — GADOBUTROL 1 MMOL/ML IV SOLN
8.9000 mL | Freq: Once | INTRAVENOUS | Status: AC | PRN
  Administered 2021-01-18: 8.9 mL via INTRAVENOUS

## 2021-01-18 NOTE — Social Work (Addendum)
CSW unable to complete assessment- patient off unit  Tony Hicks, MSW, LCSW Clinical Social Worker

## 2021-01-18 NOTE — Progress Notes (Signed)
Occupational Therapy Treatment Patient Details Name: Tony Hicks MRN: [id number] DOB: 03-24-1966 Today's Date: 01/18/2021    History of present illness Tony Hicks is a 55 y.o. male with left foot pain and nonhealing wounds. Pt noted with sepsis, cellulitis and incidental COVID-19+ finding. Pt underwent L popliteal to posterior tibial bypass on 7/11. PMH: DM2, polysubstance abuse (tobacco, alcohol, cocaine)   OT comments  Pt making excellent progress towards OT goals (met and updated accordingly). Pt received sitting EOB finishing up bathing task. Pt able to demo dressing tasks and toileting tasks with Modified Independence. Setup only for ambulation using RW with pt self-NWB due to L foot discomfort but able to hop well without safety concerns. Discussed tub transfers with 3 in 1 as shower chair (handout provided) with plans to assess in next session. Pt progressing well enough to DC from OT services soon.    Follow Up Recommendations  No OT follow up    Equipment Recommendations  3 in 1 bedside commode;Other (comment) (Rolling walker)    Recommendations for Other Services      Precautions / Restrictions Precautions Precautions: Fall;Other (comment) Precaution Comments: significant ulcer to L heel Restrictions Weight Bearing Restrictions: No Other Position/Activity Restrictions: no formal orders, but pt self NWB L LE due to pain and significant heel ulcer       Mobility Bed Mobility               General bed mobility comments: sitting EOB on entry    Transfers Overall transfer level: Modified independent Equipment used: Rolling walker (2 wheeled) Transfers: Sit to/from Stand Sit to Stand: Modified independent (Device/Increase time)              Balance Overall balance assessment: Modified Independent                                         ADL either performed or assessed with clinical judgement   ADL Overall ADL's : Needs  assistance/impaired     Grooming: Standing;Oral care;Modified independent Grooming Details (indicate cue type and reason): no LOB though pt does continue to lean on sink due to self NWB to L LE         Upper Body Dressing : Independent;Sitting   Lower Body Dressing: Sit to/from stand;Sitting/lateral leans;Modified independent Lower Body Dressing Details (indicate cue type and reason): donned R sock without issue, had managed boxers prior to entry with bathing task - reports able to stand and complete this Toilet Transfer: Set up;Ambulation;RW;Regular Toilet   Toileting- Clothing Manipulation and Hygiene: Modified independent;Sit to/from stand;Sitting/lateral lean     Tub/Shower Transfer Details (indicate cue type and reason): Discussed tub transfers due to typical motel room setup. Provided handout for use of 3 in 1 as shower chair and tub transfer. will assess next session   General ADL Comments: Pt continues to self NWB to LLE but able to maintain well, good manuevering with DME and able to problem solve compensatory strategies for ADLs     Vision   Vision Assessment?: No apparent visual deficits   Perception     Praxis      Cognition Arousal/Alertness: Awake/alert Behavior During Therapy: WFL for tasks assessed/performed Overall Cognitive Status: Within Functional Limits for tasks assessed  Exercises     Shoulder Instructions       General Comments Assisted pt in elevation of L LE at end of session with pt compliant on continued elevation of LE. Pt reports concerns about DC plan as he no longer has motel room - Nature conservation officer to relay pt concerns    Pertinent Vitals/ Pain       Pain Assessment: Faces Faces Pain Scale: Hurts a little bit Pain Location: L foot Pain Descriptors / Indicators: Sore Pain Intervention(s): Monitored during session;Repositioned  Home Living                                           Prior Functioning/Environment              Frequency  Min 2X/week        Progress Toward Goals  OT Goals(current goals can now be found in the care plan section)  Progress towards OT goals: Progressing toward goals  Acute Rehab OT Goals Patient Stated Goal: to decrease pain, go home OT Goal Formulation: With patient Time For Goal Achievement: 01/29/21 Potential to Achieve Goals: Good ADL Goals Pt Will Perform Lower Body Bathing: with modified independence;sit to/from stand;sitting/lateral leans Pt Will Perform Lower Body Dressing: with modified independence;sitting/lateral leans;sit to/from stand Pt Will Transfer to Toilet: with modified independence;ambulating Pt Will Perform Toileting - Clothing Manipulation and hygiene: with modified independence;sitting/lateral leans;sit to/from stand Pt Will Perform Tub/Shower Transfer: Tub transfer;with modified independence;ambulating;rolling walker;3 in 1  Plan Discharge plan remains appropriate    Co-evaluation                 AM-PAC OT "6 Clicks" Daily Activity     Outcome Measure   Help from another person eating meals?: None Help from another person taking care of personal grooming?: None Help from another person toileting, which includes using toliet, bedpan, or urinal?: A Little Help from another person bathing (including washing, rinsing, drying)?: A Little Help from another person to put on and taking off regular upper body clothing?: None Help from another person to put on and taking off regular lower body clothing?: None 6 Click Score: 22    End of Session Equipment Utilized During Treatment: Rolling walker  OT Visit Diagnosis: Unsteadiness on feet (R26.81);Other abnormalities of gait and mobility (R26.89);Pain Pain - Right/Left: Left Pain - part of body: Ankle and joints of foot;Leg   Activity Tolerance Patient tolerated treatment well   Patient Left in chair;with call  bell/phone within reach   Nurse Communication Mobility status        Time: 7289-7915 OT Time Calculation (min): 29 min  Charges: OT General Charges $OT Visit: 1 Visit OT Treatments $Self Care/Home Management : 23-37 mins  [name], OTR/L Acute Rehab Services Office: [phone]    Layla Maw 01/18/2021, 12:49 PM

## 2021-01-18 NOTE — Progress Notes (Signed)
PROGRESS NOTE    Tony Hicks  EOF:[id number] DOB: Jun 13, 1966 DOA: 01/08/2021 PCP: [name], NP  Chief Complaint  Patient presents with   Wound Check    Brief Narrative:  55 year old male with DM2, polysubstance abuse (tobacco, alcohol, cocaine) who comes in with worsening left foot pain.  He has several ulcers there present for the last 2 months, seen by podiatry but overall without improvement.  Pain is gotten worse, ulcers have been getting larger and he has noticed some purulent discharges.  He eventually came to the ER and was admitted to the hospital.  He was found to have COVID-19, incidental, without symptoms.  Podiatry and vascular surgery consulted, he has significant PAD and he is status post left above-knee popliteal artery to posterior tibial artery bypass at the ankle with reversed ipsilateral great saphenous vein on 7/11 by Dr [name].   Assessment & Plan:   Principal Problem:   Diabetic foot ulcer ([place]) Active Problems:   Polysubstance abuse ([place])   Hyponatremia   Microcytic anemia   Diabetes mellitus type 2, controlled ([place])   PAD (peripheral artery disease) ([facility])  Sepsis secondary to diabetic foot ulcer with cellulitis-febrile in the ED, was tachycardic, had elevated lactic acid with likely source from his diabetic foot ulcer.  Podiatry was consulted on admission. -ABI showed severe vascular disease, vascular surgery consulted. Underwent arteriogram on 7/8 with popliteal artery occlusion on the left lower extremity behind the knee, and was taken to OR 7/11 for left above-knee popliteal to posterior tibial artery bypass. -CT scan of the foot with ulceration of the lateral aspect of the heel but no underlying abscess, osteomyelitis or anything else acute - elevated ESR/CRP - MRI with soft tissue ulceration along lateral aspect of heel - no osteo, septic joint, or abscess - transitioned to oral abx 7/13 - will plan for additional 10 days after d/c -Podiatry planning  to follow outpatient, planning enzymatic debridement at this time -PT/OT consultation pending  COVID-19-remains asymptomatic.  Needs 10 days of isolation from 7/5 when he was first diagnosed.  Can d/c isolation tomorrow.   Type 2 diabetes mellitus, with hyperglycemia-A1c 6.2.  Patient was placed on sliding scale.  Monitor CBGs  Microcytic anemia, iron deficiency anemia-monitor, received IV iron, will place on oral iron on discharge.  Hemoglobin 7.8, stable from yesterday.  No bleeding, transfuse if less than 7  Hyponatremia-monitor, query related to EtOH.  Overall stable   Polysubstance abuse-Patient admits to drinking significant amount of alcohol most days, smoking tobacco, and snorting cocaine recently. CIWA protocols initiated, does not appear to be withdrawing  DVT prophylaxis: lovenox Code Status: full  Family Communication: none at bedside Disposition:   Status is: Inpatient  Remains inpatient appropriate because:Inpatient level of care appropriate due to severity of illness  Dispo: The patient is from: Home              Anticipated d/c is to:  [place]              Patient currently is not medically stable to d/c.   Difficult to place patient No       Consultants:  vascular  Procedures:  7/11 Procedure: 1.  Harvest of left leg great saphenous vein 2.  Left above-knee popliteal artery to posterior tibial artery bypass at the ankle with reversed ipsilateral great saphenous vein  PROCEDURE:   1) US guided right common femoral artery access 2) Aortogram 3) left lower extremity angiogram with second order cannulation (83m total  contrast) 4) Conscious sedation (31 minutes)  LLE arterial duplex Summary:  Left: Stenosis noted mid SFA and distal FA/proximal popliteal artery. Mid  to distal popliteal artery appears occluded. Minimal flow noted in the AT  and PT arteries.   ABI  Summary:  Right: Resting right ankle-brachial index indicates noncompressible right   lower extremity arteries. The right toe-brachial index is abnormal.   Left: Resting left ankle-brachial index indicates severe left lower  extremity arterial disease. The left toe-brachial index is absent.    Antimicrobials:  Anti-infectives (From admission, onward)    Start     Dose/Rate Route Frequency Ordered Stop   01/16/21 2200  amoxicillin-clavulanate (AUGMENTIN) 875-125 MG per tablet 1 tablet        1 tablet Oral Every 12 hours 01/16/21 2010     01/16/21 2030  doxycycline (VIBRA-TABS) tablet 100 mg        100 mg Oral Every 12 hours 01/16/21 2010     01/12/21 1600  vancomycin (VANCOREADY) IVPB 1500 mg/300 mL  Status:  Discontinued        1,500 mg 150 mL/hr over 120 Minutes Intravenous Every 12 hours 01/12/21 0833 01/16/21 2010   01/10/21 0600  vancomycin (VANCOREADY) IVPB 1000 mg/200 mL  Status:  Discontinued        1,000 mg 200 mL/hr over 60 Minutes Intravenous Every 12 hours 01/09/21 1345 01/12/21 0833   01/09/21 1345  vancomycin (VANCOREADY) IVPB 1500 mg/300 mL        1,500 mg 150 mL/hr over 120 Minutes Intravenous  Once 01/09/21 1246 01/09/21 1635   01/08/21 1400  cefTRIAXone (ROCEPHIN) 2 g in sodium chloride 0.9 % 100 mL IVPB  Status:  Discontinued        2 g 200 mL/hr over 30 Minutes Intravenous Every 24 hours 01/08/21 1345 01/16/21 2010          Subjective: No new complaints  Objective: Vitals:   01/18/21 0030 01/18/21 0327 01/18/21 0835 01/18/21 1357  BP: 116/64 123/65 (!) 110/59 111/60  Pulse: 69 72  74  Resp: _0 Temp: 98.2 F (36.8 C) 97.8 F (36.6 C) 98 F (36.7 C) 98.2 F (36.8 C)  TempSrc: Oral Oral Oral Oral  SpO2: 100% 99% 100% 100%  Weight:      Height:        Intake/Output Summary (Last 24 hours) at 01/18/2021 1533 Last data filed at 01/18/2021 1300 Gross per 24 hour  Intake 1260 ml  Output 900 ml  Net 360 ml   Filed Weights   01/09/21 1200 01/12/21 1554  Weight: 83.9 kg 89.3 kg    Examination:  General: No acute  distress. Cardiovascular: RRR Lungs: unlabored Abdomen: Soft, nontender, nondistended Neurological: Alert and oriented 3. Moves all extremities 4 . Cranial nerves II through XII grossly intact. Extremities: L heel ulcer     Data Reviewed: I have personally reviewed following labs and imaging studies  CBC: Recent Labs  Lab 01/14/21 0059 01/15/21 0500 01/16/21 0229 01/17/21 0130 01/18/21 0139  WBC 6.8 13.4* 9.4 6.8 7.2  NEUTROABS  --   --   --  4.2 4.6  HGB 7.9* 7.8* 7.9* 7.7* 7.4*  HCT 26.1* 25.3* 26.0* 25.2* 25.1*  MCV 66.2* 66.2* 67.9* 68.1* 68.6*  PLT 351 322 315 298 774    Basic Metabolic Panel: Recent Labs  Lab 01/14/21 0059 01/15/21 0500 01/16/21 0634 01/17/21 0130 01/18/21 0139  NA 134* 134* 133* 132* 132*  K 4.1 4.2 3.9  3.9 3.7  CL 102 103 99 102 100  CO2 _0 GLUCOSE 93 95 103* 110* 129*  BUN _1 CREATININE 0.71 0.63 0.70 0.72 0.61  CALCIUM 9.1 8.9 9.1 8.9 9.1  MG  --   --   --  1.8 1.9  PHOS  --   --   --  4.3 4.3    GFR: Estimated Creatinine Clearance: 110.5 mL/min (by C-G formula based on SCr of 0.61 mg/dL).  Liver Function Tests: Recent Labs  Lab 01/17/21 0130 01/18/21 0139  AST 13* 13*  ALT 17 17  ALKPHOS 40 38  BILITOT 0.6 0.1*  PROT 6.3* 6.4*  ALBUMIN 2.7* 2.8*    CBG: Recent Labs  Lab 01/17/21 1223 01/17/21 1711 01/17/21 2130 01/18/21 0639 01/18/21 1353  GLUCAP 158* 163* 163* 113* 120*     No results found for this or any previous visit (from the past 240 hour(s)).        Radiology Studies: MR FOOT LEFT W WO CONTRAST  Result Date: 01/18/2021 CLINICAL DATA:  Worsening left foot pain with ulceration. History of diabetes and polysubstance abuse. Concern for diabetes. EXAM: MRI OF THE LEFT FOREFOOT WITHOUT AND WITH CONTRAST TECHNIQUE: Multiplanar, multisequence MR imaging of the left foot was performed both before and after administration of intravenous contrast. CONTRAST:  8.18m GADAVIST  GADOBUTROL 1 MMOL/ML IV SOLN COMPARISON:  CT of the left foot and ankle 01/11/2021. FINDINGS: Large field of view study includes most of the foot with the exception of the distal toes. Despite efforts by the technologist and patient, mild motion artifact is present on today's exam and could not be eliminated. This reduces exam sensitivity and specificity. Bones/Joint/Cartilage As demonstrated on recent CT, there is soft tissue ulceration along the lateral aspect of the heel without evidence of underlying calcaneal cortical destruction, marrow edema or abnormal enhancement. There is no evidence of acute fracture, dislocation or other significant osseous abnormality. Minimal degenerative changes are present at the 1st MTP joint. No significant joint effusions or abnormal synovial enhancement. Ligaments The major ankle ligaments appear intact. Muscles and Tendons The ankle and hindfoot tendons appear normal. The forefoot muscles and tendons appear normal, without abnormal enhancement or tenosynovitis. Soft tissues As above, soft tissue ulceration along the lateral aspect of the heel with surrounding soft tissue enhancement, but no focal fluid collection. There is superficial decreased enhancement along ulcer which may reflect devitalized soft tissue. No evidence of foreign body. Nonspecific subcutaneous edema around the ankle and extending into the dorsum of foot. IMPRESSION: 1. Soft tissue ulceration along the lateral aspect of the heel, similar to recent CT. There is poor superficial enhancement in this area which may reflect devitalized tissue. No focal abscess. 2. No evidence of osteomyelitis or septic joint. Electronically Signed   By: WRichardean SaleM.D.   On: 01/18/2021 13:48        Scheduled Meds:  amoxicillin-clavulanate  1 tablet Oral Q12H   aspirin EC  81 mg Oral Daily   atorvastatin  40 mg Oral Daily   collagenase   Topical Daily   docusate sodium  100 mg Oral Daily   doxycycline  100 mg Oral  Q12H   enoxaparin (LOVENOX) injection  40 mg Subcutaneous QY65L  folic acid  1 mg Oral Daily   gabapentin  300 mg Oral BID   insulin aspart  0-9 Units Subcutaneous TID WC   living well with diabetes book  Does not apply Once   multivitamin with minerals  1 tablet Oral Daily   nicotine  14 mg Transdermal Daily   nutrition supplement (JUVEN)  1 packet Oral BID BM   pantoprazole  40 mg Oral Daily   Ensure Max Protein  11 oz Oral QHS   sodium chloride flush  3 mL Intravenous Q12H   thiamine  100 mg Oral Daily   Or   thiamine  100 mg Intravenous Daily   Continuous Infusions:  sodium chloride     sodium chloride     sodium chloride     magnesium sulfate bolus IVPB       LOS: 10 days    Time spent: over 30 min    Fayrene Helper, MD [facility]   To contact the attending provider between 7A-7P or the covering provider during after hours 7P-7A, please log into the web site [email or web address] and access using universal [place] password for that web site. If you do not have the password, please call the hospital operator.  01/18/2021, 3:33 PM

## 2021-01-18 NOTE — Progress Notes (Signed)
[facility]  Subjective  - no complaints.   Objective (!) 110/59 72 98 F (36.7 C) (Oral) 19 100%  Intake/Output Summary (Last 24 hours) at 01/18/2021 1354 Last data filed at 01/18/2021 0836 Gross per 24 hour  Intake 780 ml  Output 900 ml  Net -120 ml    Palpable pulse in left above-knee popliteal to PT bypass at the ankle Brisk left PT signal Expected postop leg swelling Incisions clean dry and intact  Laboratory Lab Results: Recent Labs    01/17/21 0130 01/18/21 0139  WBC 6.8 7.2  HGB 7.7* 7.4*  HCT 25.2* 25.1*  PLT 298 378   BMET Recent Labs    01/17/21 0130 01/18/21 0139  NA 132* 132*  K 3.9 3.7  CL 102 100  CO2 23 25  GLUCOSE 110* 129*  BUN 13 14  CREATININE 0.72 0.61  CALCIUM 8.9 9.1    COAG No results found for: INR, PROTIME No results found for: PTT  Assessment/Planning:  Doing well status post left above-knee popliteal to PT bypass.  Palpable pulse in the bypass.  Brisk PT signal distally in the ankle.  Continue aspirin and statin from my standpoint.  Okay for discharge.  Podiatry is following the foot wounds and have recommended outpatient follow-up.  He has f/u arranged with [place] 02/05/21.  ESAIAS CLEAVENGER 01/18/2021 1:54 PM --

## 2021-01-19 LAB — GLUCOSE, CAPILLARY
Glucose-Capillary: 125 mg/dL — ABNORMAL HIGH (ref 70–99)
Glucose-Capillary: 115 mg/dL — ABNORMAL HIGH (ref 70–99)
Glucose-Capillary: 140 mg/dL — ABNORMAL HIGH (ref 70–99)

## 2021-01-19 LAB — CBC WITH DIFFERENTIAL/PLATELET
Abs Immature Granulocytes: 0.03 10*3/uL (ref 0.00–0.07)
Basophils Absolute: 0 10*3/uL (ref 0.0–0.1)
Basophils Relative: 1 %
Eosinophils Absolute: 0.1 10*3/uL (ref 0.0–0.5)
Eosinophils Relative: 1 %
HCT: 25.4 % — ABNORMAL LOW (ref 39.0–52.0)
Hemoglobin: 7.8 g/dL — ABNORMAL LOW (ref 13.0–17.0)
Immature Granulocytes: 0 %
Lymphocytes Relative: 25 %
Lymphs Abs: 1.8 10*3/uL (ref 0.7–4.0)
MCH: 20.9 pg — ABNORMAL LOW (ref 26.0–34.0)
MCHC: 30.7 g/dL (ref 30.0–36.0)
MCV: 67.9 fL — ABNORMAL LOW (ref 80.0–100.0)
Monocytes Absolute: 0.7 10*3/uL (ref 0.1–1.0)
Monocytes Relative: 9 %
Neutro Abs: 4.4 10*3/uL (ref 1.7–7.7)
Neutrophils Relative %: 64 %
Platelets: 397 10*3/uL (ref 150–400)
RBC: 3.74 MIL/uL — ABNORMAL LOW (ref 4.22–5.81)
RDW: 25.3 % — ABNORMAL HIGH (ref 11.5–15.5)
WBC: 7 10*3/uL (ref 4.0–10.5)
nRBC: 0 % (ref 0.0–0.2)

## 2021-01-19 LAB — MAGNESIUM: Magnesium: 1.8 mg/dL (ref 1.7–2.4)

## 2021-01-19 LAB — COMPREHENSIVE METABOLIC PANEL
ALT: 16 U/L (ref 0–44)
AST: 13 U/L — ABNORMAL LOW (ref 15–41)
Albumin: 2.8 g/dL — ABNORMAL LOW (ref 3.5–5.0)
Alkaline Phosphatase: 38 U/L (ref 38–126)
Anion gap: 7 (ref 5–15)
BUN: 13 mg/dL (ref 6–20)
CO2: 24 mmol/L (ref 22–32)
Calcium: 9 mg/dL (ref 8.9–10.3)
Chloride: 101 mmol/L (ref 98–111)
Creatinine, Ser: 0.59 mg/dL — ABNORMAL LOW (ref 0.61–1.24)
GFR, Estimated: >60 mL/min (ref >60)
Glucose, Bld: 115 mg/dL — ABNORMAL HIGH (ref 70–99)
Potassium: 3.8 mmol/L (ref 3.5–5.1)
Sodium: 132 mmol/L — ABNORMAL LOW (ref 135–145)
Total Bilirubin: 0.4 mg/dL (ref 0.3–1.2)
Total Protein: 6.4 g/dL — ABNORMAL LOW (ref 6.5–8.1)

## 2021-01-19 LAB — PHOSPHORUS: Phosphorus: 4.4 mg/dL (ref 2.5–4.6)

## 2021-01-19 MED ORDER — ATORVASTATIN CALCIUM 40 MG PO TABS: 40.0000 mg | ORAL_TABLET | Freq: Every day | ORAL | 3 refills | Status: DC

## 2021-01-19 MED ORDER — OXYCODONE HCL 5 MG PO TABS: 5.0000 mg | ORAL_TABLET | Freq: Four times a day (QID) | ORAL | 0 refills | Status: AC | PRN

## 2021-01-19 MED ORDER — ASPIRIN 81 MG PO TBEC: 81.0000 mg | DELAYED_RELEASE_TABLET | Freq: Every day | ORAL | 11 refills | Status: DC

## 2021-01-19 MED ORDER — POLYETHYLENE GLYCOL 3350 17 G PO PACK
17.0000 g | PACK | Freq: Two times a day (BID) | ORAL | Status: DC
  Administered 2021-01-19: 17 g via ORAL
  Filled 2021-01-19: qty 1

## 2021-01-19 MED ORDER — OXYCODONE HCL 5 MG PO TABS
5.0000 mg | ORAL_TABLET | ORAL | Status: DC | PRN
  Administered 2021-01-19 (×2): 5 mg via ORAL
  Filled 2021-01-19 (×2): qty 1

## 2021-01-19 MED ORDER — ACETAMINOPHEN 325 MG PO TABS: 650.0000 mg | ORAL_TABLET | ORAL | 2 refills | Status: AC | PRN

## 2021-01-19 MED ORDER — DOXYCYCLINE HYCLATE 100 MG PO TABS: 100.0000 mg | ORAL_TABLET | Freq: Two times a day (BID) | ORAL | 0 refills | Status: AC

## 2021-01-19 MED ORDER — OXYCODONE HCL 5 MG PO TABS: 5.0000 mg | ORAL_TABLET | Freq: Four times a day (QID) | ORAL | 0 refills | Status: DC | PRN

## 2021-01-19 MED ORDER — PANTOPRAZOLE SODIUM 40 MG PO TBEC: 40.0000 mg | DELAYED_RELEASE_TABLET | Freq: Every day | ORAL | 0 refills | Status: DC

## 2021-01-19 MED ORDER — GABAPENTIN 300 MG PO CAPS
300.0000 mg | ORAL_CAPSULE | Freq: Two times a day (BID) | ORAL | 0 refills | Status: DC
Start: 2021-01-19 — End: 2021-01-19

## 2021-01-19 MED ORDER — ACETAMINOPHEN 325 MG PO TABS
650.0000 mg | ORAL_TABLET | ORAL | 2 refills | Status: DC | PRN
Start: 2021-01-19 — End: 2021-01-19

## 2021-01-19 MED ORDER — COLLAGENASE 250 UNIT/GM EX OINT
TOPICAL_OINTMENT | Freq: Every day | CUTANEOUS | 0 refills | Status: DC
Start: 2021-01-19 — End: 2021-03-14

## 2021-01-19 MED ORDER — AMOXICILLIN-POT CLAVULANATE 875-125 MG PO TABS: 1.0000 | ORAL_TABLET | Freq: Two times a day (BID) | ORAL | 0 refills | Status: AC

## 2021-01-19 MED ORDER — FERROUS SULFATE 325 (65 FE) MG PO TABS: 325.0000 mg | ORAL_TABLET | Freq: Every day | ORAL | 0 refills | Status: DC

## 2021-01-19 MED ORDER — NICOTINE 14 MG/24HR TD PT24: 14.0000 mg | MEDICATED_PATCH | Freq: Every day | TRANSDERMAL | 0 refills | Status: DC

## 2021-01-19 MED ORDER — DOXYCYCLINE HYCLATE 100 MG PO TABS: 100.0000 mg | ORAL_TABLET | Freq: Two times a day (BID) | ORAL | 0 refills | Status: DC

## 2021-01-19 MED ORDER — COLLAGENASE 250 UNIT/GM EX OINT
TOPICAL_OINTMENT | Freq: Every day | CUTANEOUS | 0 refills | Status: DC
Start: 2021-01-19 — End: 2021-01-19

## 2021-01-19 MED ORDER — GABAPENTIN 300 MG PO CAPS: 300.0000 mg | ORAL_CAPSULE | Freq: Two times a day (BID) | ORAL | 0 refills | Status: DC

## 2021-01-19 MED ORDER — AMOXICILLIN-POT CLAVULANATE 875-125 MG PO TABS: 1.0000 | ORAL_TABLET | Freq: Two times a day (BID) | ORAL | 0 refills | Status: DC

## 2021-01-19 MED ORDER — BISACODYL 10 MG RE SUPP: 10.0000 mg | Freq: Every day | RECTAL | Status: DC | PRN

## 2021-01-19 NOTE — Social Work (Addendum)
CSW met with to discuss discharge planning and address the consults. Pt explained that he was living at the [facility] however cannot return due to not having the funds to pay for the room. CSW inquired about address on facesheet and pt explained that it was his sister's address and he could not go there. CSW asked about pt's mom and he said that he may be able to be discharged to her house. CSW asked pt to call mom. Pt spoke with his mother and she stated that pt could come there for a couple days.  Pt explained that he currently does not have any income and would need assistance with medication.  CSW explained that she could provide a taxi voucher today and hospital could provide assistance with medication.  CSW provided pt with housing resources.. CSW inquired about pt SA use and he stated that it was not a problem however he  accepted the SA resources.  MD and RN updated about pt's discharge plan.

## 2021-01-19 NOTE — Discharge Summary (Addendum)
Physician Discharge Summary  Tony Hicks GMW:[id number] DOB: 1965/10/20 DOA: 01/08/2021  PCP: [name], NP  Admit date: 01/08/2021 Discharge date: 01/19/2021  Time spent: 40 minutes  Recommendations for Outpatient Follow-up:  Follow outpatient CBC/CMP Continue aspirin and statin Continue wound care as prescribed Follow up with vascular surgery as an outpatient Follow up with wound care center outpatient  Encourage cessation from cocaine, tobacco, alcohol, etc    Discharge Diagnoses:  Principal Problem:   Diabetic foot ulcer ([place]) Active Problems:   Polysubstance abuse ([place])   Hyponatremia   Microcytic anemia   Diabetes mellitus type 2, controlled ([place])   PAD (peripheral artery disease) ([place])   Discharge Condition: stable  Diet recommendation: heart healthy  Filed Weights   01/09/21 1200 01/12/21 1554  Weight: 83.9 kg 89.3 kg    History of present illness:  55 year old male with DM2, polysubstance abuse (tobacco, alcohol, cocaine) who comes in with worsening left foot pain.  He has several ulcers there present for the last 2 months, seen by podiatry but overall without improvement.  Pain is gotten worse, ulcers have been getting larger and he has noticed some purulent discharges.  He eventually came to the ER and was admitted to the hospital.  He was found to have COVID-19, incidental, without symptoms.  Podiatry and vascular surgery consulted, he has significant PAD and he is status post left above-knee popliteal artery to posterior tibial artery bypass at the ankle with reversed ipsilateral great saphenous vein on 7/11 by Dr [name].  Podiatry followed post op and recommended enzymatic debridement and recommended follow up at wound care center outpatient.  MRI did not show evidence of deep infection.  Plan for discharge on abx with outpatient follow up.  He's going to be d/c'd home to his mother's house, leaving with taxi voucher.  Hospital Course:  Sepsis secondary to  diabetic foot ulcer with cellulitis-febrile in the ED, was tachycardic, had elevated lactic acid with likely source from his diabetic foot ulcer.  Podiatry was consulted on admission. -ABI showed severe vascular disease, vascular surgery consulted. Underwent arteriogram on 7/8 with popliteal artery occlusion on the left lower extremity behind the knee, and was taken to OR 7/11 for left above-knee popliteal to posterior tibial artery bypass. -CT scan of the foot with ulceration of the lateral aspect of the heel but no underlying abscess, osteomyelitis or anything else acute - elevated ESR/CRP - MRI with soft tissue ulceration along lateral aspect of heel - no osteo, septic joint, or abscess - transitioned to oral abx 7/13 - has already had abx since 7/5.  Will discharge with another 5 days, no deep infection noted on imaging.  Follow outpatient.   -Podiatry planning to follow outpatient, planning enzymatic debridement at this time - apply santyl daily, nickel thick, followed by wet to dry dressing -PT/OT consultation pending   COVID-19-remains asymptomatic.  Needs 10 days of isolation from 7/5 when he was first diagnosed.  Can d/c isolation at this time.   Type 2 diabetes mellitus, with hyperglycemia-A1c 6.2.  Patient was placed on sliding scale.  Monitor CBGs Resume metformin at discharge   Microcytic anemia, iron deficiency anemia-monitor, received IV iron, will place on oral iron on discharge.  Hemoglobin 7.8, stable from yesterday.  No bleeding, transfuse if less than 7 D/c with iron  Hyponatremia-monitor, query related to EtOH.  Overall stable   Polysubstance abuse-Patient admits to drinking significant amount of alcohol most days, smoking tobacco, and snorting cocaine recently. CIWA protocols initiated,  does not appear to be withdrawing Encourage cessation  TOC team assistance with cost of meds, d/c plan as sounds like he is without Jeorgia Hicks home  Procedures: 7/11 Procedure: 1.  Harvest of  left leg great saphenous vein 2.  Left above-knee popliteal artery to posterior tibial artery bypass at the ankle with reversed ipsilateral great saphenous vein   PROCEDURE:   1) US guided right common femoral artery access 2) Aortogram 3) left lower extremity angiogram with second order cannulation (57m total contrast) 4) Conscious sedation (31 minutes)   LLE arterial duplex Summary:  Left: Stenosis noted mid SFA and distal FA/proximal popliteal artery. Mid  to distal popliteal artery appears occluded. Minimal flow noted in the AT  and PT arteries.   ABI Summary:  Right: Resting right ankle-brachial index indicates noncompressible right  lower extremity arteries. The right toe-brachial index is abnormal.   Left: Resting left ankle-brachial index indicates severe left lower  extremity arterial disease. The left toe-brachial index is absent  Consultations: Vascular podiatry  Discharge Exam: Vitals:   01/19/21 0828 01/19/21 1134  BP: (!) 105/53 110/72  Pulse: 76 76  Resp: (!) 24 17  Temp: (!) 97.5 F (36.4 C) 98.2 F (36.8 C)  SpO2: 100% 95%   No new complaints  General: No acute distress. Cardiovascular: RRR Lungs: unlabored Abdomen: Soft, nontender, nondistended Neurological: Alert and oriented 3. Moves all extremities 4 . Cranial nerves II through XII grossly intact. Extremities: LLE with ulceration to heel, foul smelling   Discharge Instructions   Discharge Instructions     AMB referral to wound care center   Complete by: As directed    Call MD for:  difficulty breathing, headache or visual disturbances   Complete by: As directed    Call MD for:  extreme fatigue   Complete by: As directed    Call MD for:  hives   Complete by: As directed    Call MD for:  persistant dizziness or light-headedness   Complete by: As directed    Call MD for:  persistant nausea and vomiting   Complete by: As directed    Call MD for:  redness, tenderness, or signs of  infection (pain, swelling, redness, odor or green/yellow discharge around incision site)   Complete by: As directed    Call MD for:  severe uncontrolled pain   Complete by: As directed    Call MD for:  temperature >100.4   Complete by: As directed    Diet - low sodium heart healthy   Complete by: As directed    Discharge instructions   Complete by: As directed    You were seen for Joniel Graumann left ankle wound and treated for cellulitis.  You were treated by vascular surgery with bypass surgery.  You've been started on aspirin and statin, you should continue these medicines indefinitely.  The plan for your foot wounds is to continue local wound care with santyl and to follow outpatient with the wound clinic.  You have follow up scheduled with vascular on 02/05/2021.    We'll treat you with antibiotics for and additional 5 days at discharge.    Stopping use of tobacco and cocaine is extremely important.  Both put you at risk of worsening vascular disease.  Alcohol cessation is also important for your overall health.   Return for new, recurrent, or worsening symptoms.  Please ask your PCP to request records from this hospitalization so they know what was done and what the next steps  will be.   Discharge wound care:   Complete by: As directed    Apply santyl to left ankle wound nickel thick, followed by saline moist gauze, dry gauze, kerlix wrap and tape. Do not apply compression bandage.   Increase activity slowly   Complete by: As directed       Allergies as of 01/19/2021   No Known Allergies      Medication List     TAKE these medications    acetaminophen 325 MG tablet Commonly known as: Tylenol Take 2 tablets (650 mg total) by mouth every 4 (four) hours as needed.   amoxicillin-clavulanate 875-125 MG tablet Commonly known as: AUGMENTIN Take 1 tablet by mouth every 12 (twelve) hours for 5 days.   aspirin 81 MG EC tablet Take 1 tablet (81 mg total) by mouth daily. Swallow  whole. Start taking on: January 20, 2021   atorvastatin 40 MG tablet Commonly known as: LIPITOR Take 1 tablet (40 mg total) by mouth daily. Start taking on: January 20, 2021   bacitracin ointment Apply 1 application topically 2 (two) times daily.   collagenase ointment Commonly known as: SANTYL Apply topically daily. Apply to left ankle wound nickel thick, followed by wet to dry dressing.   doxycycline 100 MG tablet Commonly known as: VIBRA-TABS Take 1 tablet (100 mg total) by mouth every 12 (twelve) hours for 5 days.   ferrous sulfate 325 (65 FE) MG tablet Take 1 tablet (325 mg total) by mouth daily with breakfast.   gabapentin 300 MG capsule Commonly known as: NEURONTIN Take 1 capsule (300 mg total) by mouth 2 (two) times daily.   ibuprofen 600 MG tablet Commonly known as: ADVIL Take 1 tablet (600 mg total) by mouth every 8 (eight) hours as needed.   metFORMIN 500 MG tablet Commonly known as: GLUCOPHAGE Take 1 tablet (500 mg total) by mouth daily with breakfast.   nicotine 14 mg/24hr patch Commonly known as: NICODERM CQ - dosed in mg/24 hours Place 1 patch (14 mg total) onto the skin daily. Start taking on: January 20, 2021   oxyCODONE 5 MG immediate release tablet Commonly known as: Oxy IR/ROXICODONE Take 1 tablet (5 mg total) by mouth every 6 (six) hours as needed for up to 3 days for severe pain.   pantoprazole 40 MG tablet Commonly known as: PROTONIX Take 1 tablet (40 mg total) by mouth daily. Start taking on: January 20, 2021               Discharge Care Instructions  (From admission, onward)           Start     Ordered   01/19/21 0000  Discharge wound care:       Comments: Apply santyl to left ankle wound nickel thick, followed by saline moist gauze, dry gauze, kerlix wrap and tape. Do not apply compression bandage.   01/19/21 1224           No Known Allergies  Follow-up Information     [facility] Follow up in 3  week(s).   Specialty: Vascular Surgery Why: Office will call you to arrange your appt (sent) Contact information: [place] [address] [place] and [place] Follow up.   Specialty: Wound Care Why: Call for Kalani Sthilaire follow up appointment, we've placed Ephraim Reichel referral Contact information: [address], [place] [id number] [place] [phone]  The results of significant diagnostics from this hospitalization (including imaging, microbiology, ancillary and laboratory) are listed below for reference.    Significant Diagnostic Studies: DG Chest 2 View  Result Date: 12/21/2020 CLINICAL DATA:  Chest pain EXAM: CHEST - 2 VIEW COMPARISON:  Chest radiograph 06/06/2009 FINDINGS: The heart size and mediastinal contours are within normal limits.No focal airspace disease. No pleural effusion or pneumothorax.No acute osseous abnormality. IMPRESSION: No evidence of acute cardiopulmonary disease. Electronically Signed   By: Maurine Simmering   On: 12/21/2020 15:43   DG Ankle Complete Left  Result Date: 01/08/2021 CLINICAL DATA:  LEFT foot pain and swelling and LEFT ankle pain for 1 month, open wound at LEFT heel, struck bed frame 1 month ago, history diabetes mellitus, smoking EXAM: LEFT ANKLE COMPLETE - 3+ VIEW COMPARISON:  None FINDINGS: Osseous mineralization normal. Minimal soft tissue swelling, especially laterally. Joint spaces preserved. Corticated old ossicles adjacent to the tips of the lateral and medial malleoli. No acute fracture, dislocation, or bone destruction. Tiny plantar calcaneal spur. Soft tissue lucency overlying the calcaneus dorsally consistent with ulcer, though no underlying bone destruction is identified. IMPRESSION: Posterior heel ulcer without underlying bone destruction; if there is clinical concern for osteomyelitis, consider MR. No acute osseous abnormalities. Electronically  Signed   By: Lavonia Dana M.D.   On: 01/08/2021 10:25   CT ANKLE LEFT W CONTRAST  Result Date: 01/11/2021 CLINICAL DATA:  Diabetic patient with Dhyana Bastone skin ulceration on the left foot. EXAM: CT OF THE LEFT ANKLE AND FOOT WITH CONTRAST TECHNIQUE: Multidetector CT imaging of the left ankle and foot was performed following the standard protocol during bolus administration of intravenous contrast. CONTRAST:  100 mL Omnipaque 300. COMPARISON:  None. FINDINGS: Bones/Joint/Cartilage No bony destructive change or periosteal reaction. No fracture or dislocation. No evidence of arthropathy. No joint effusion. Ligaments Suboptimally assessed by CT. Muscles and Tendons No intramuscular fluid collection or gas.  Appear intact. Soft tissues Skin ulceration is seen on the lateral aspect of the heel. No underlying abscess. No mass. IMPRESSION: Skin ulceration on the lateral aspect of the heel without underlying abscess. No evidence of osteomyelitis or other acute abnormality. Electronically Signed   By: Inge Rise M.D.   On: 01/11/2021 14:31   CT FOOT LEFT W CONTRAST  Result Date: 01/11/2021 CLINICAL DATA:  Diabetic patient with Siyon Linck skin ulceration on the left foot. EXAM: CT OF THE LEFT ANKLE AND FOOT WITH CONTRAST TECHNIQUE: Multidetector CT imaging of the left ankle and foot was performed following the standard protocol during bolus administration of intravenous contrast. CONTRAST:  100 mL Omnipaque 300. COMPARISON:  None. FINDINGS: Bones/Joint/Cartilage No bony destructive change or periosteal reaction. No fracture or dislocation. No evidence of arthropathy. No joint effusion. Ligaments Suboptimally assessed by CT. Muscles and Tendons No intramuscular fluid collection or gas.  Appear intact. Soft tissues Skin ulceration is seen on the lateral aspect of the heel. No underlying abscess. No mass. IMPRESSION: Skin ulceration on the lateral aspect of the heel without underlying abscess. No evidence of osteomyelitis or other acute  abnormality. Electronically Signed   By: Inge Rise M.D.   On: 01/11/2021 14:31   MR FOOT LEFT W WO CONTRAST  Result Date: 01/18/2021 CLINICAL DATA:  Worsening left foot pain with ulceration. History of diabetes and polysubstance abuse. Concern for diabetes. EXAM: MRI OF THE LEFT FOREFOOT WITHOUT AND WITH CONTRAST TECHNIQUE: Multiplanar, multisequence MR imaging of the left foot was performed both before and after administration of intravenous contrast.  CONTRAST:  8.41m GADAVIST GADOBUTROL 1 MMOL/ML IV SOLN COMPARISON:  CT of the left foot and ankle 01/11/2021. FINDINGS: Large field of view study includes most of the foot with the exception of the distal toes. Despite efforts by the technologist and patient, mild motion artifact is present on today's exam and could not be eliminated. This reduces exam sensitivity and specificity. Bones/Joint/Cartilage As demonstrated on recent CT, there is soft tissue ulceration along the lateral aspect of the heel without evidence of underlying calcaneal cortical destruction, marrow edema or abnormal enhancement. There is no evidence of acute fracture, dislocation or other significant osseous abnormality. Minimal degenerative changes are present at the 1st MTP joint. No significant joint effusions or abnormal synovial enhancement. Ligaments The major ankle ligaments appear intact. Muscles and Tendons The ankle and hindfoot tendons appear normal. The forefoot muscles and tendons appear normal, without abnormal enhancement or tenosynovitis. Soft tissues As above, soft tissue ulceration along the lateral aspect of the heel with surrounding soft tissue enhancement, but no focal fluid collection. There is superficial decreased enhancement along ulcer which may reflect devitalized soft tissue. No evidence of foreign body. Nonspecific subcutaneous edema around the ankle and extending into the dorsum of foot. IMPRESSION: 1. Soft tissue ulceration along the lateral aspect of the  heel, similar to recent CT. There is poor superficial enhancement in this area which may reflect devitalized tissue. No focal abscess. 2. No evidence of osteomyelitis or septic joint. Electronically Signed   By: WRichardean SaleM.D.   On: 01/18/2021 13:48   PERIPHERAL VASCULAR CATHETERIZATION  Result Date: 01/13/2021 Formatting of this result is different from the original. [place] 01/11/2021   PATIENT:  HLangston Masker 55y.o. male   PRE-OPERATIVE DIAGNOSIS:  Atherosclerosis of native arteries of left lower extremity lower extremity causing ulceration   POST-OPERATIVE DIAGNOSIS:  Same   PROCEDURE:  1) UK[place]aguided right common femoral artery access 2) Aortogram 3) left lower extremity angiogram with second order cannulation (632mtotal contrast) 4) Conscious sedation (31 minutes)   SURGEON:  Surgeon(s) and Role:    * HaCherre RobinsMD - Primary   ASSISTANT: none   ANESTHESIA:   local and IV sedation   EBL: min   BLOOD ADMINISTERED:none   DRAINS: none   LOCAL MEDICATIONS USED:  LIDOCAINE   SPECIMEN:  none   COUNTS: confirmed correct.   TOURNIQUET:  none   PATIENT DISPOSITION:  PACU - hemodynamically stable.  Delay start of Pharmacological VTE agent (>24hrs) due to surgical blood loss or risk of bleeding: no   INDICATION FOR PROCEDURE: HaZackry Deiness Tiffanee Mcnee [age].o. male with peripheral arterial disease and ischemic wounds of the left foot. After careful discussion of risks, benefits, and alternatives the patient was offered angiography. We specifically discussed access site complications. The patient understood and wished to proceed.   OPERATIVE FINDINGS: Unremarkable aortogram and iliac angiograms   Left lower extremity CFA / PFA / SFA no flow limiting stenosis Popliteal artery occluded behind-the-knee Reconstitution of tibial vessels about the mid calf PT is dominant and fills the foot   DESCRIPTION OF PROCEDURE: After identification of the patient in the pre-operative holding area, the patient was transferred  to the operating room. The patient was positioned supine on the operating room table. Anesthesia was induced. The groins was prepped and draped in standard fashion. Teriann Livingood surgical pause was performed confirming correct patient, procedure, and operative location.   The right groin was anesthetized with subcutaneous injection of  1% lidocaine. Using ultrasound guidance, the right common femoral artery was accessed with micropuncture technique. Fluoroscopy was used to confirm cannulation over the femoral head. Sheathogram was not performed. The 68F sheath was upsized to 32F.   An 035 glidewire advantage was advanced into the distal aorta. Over the wire an omni flush catheter was advanced to the level of L2. Aortogram was performed - see above for details.   The left common iliac artery was selected with the 035 glidewire advantage. The wire was advanced into the common femoral artery. Over the wire the omni flush catheter was advanced into the external iliac artery. Selective angiography was performed - see above for details.   Vincente Asbridge mynx device was used to close the arteriotomy. Hemostasis was excellent upon completion.   Conscious sedation was administered with the use of IV fentanyl and midazolam under continuous physician and nurse monitoring.  Heart rate, blood pressure, and oxygen saturation were continuously monitored.  Total sedation time was 31 minutes   Upon completion of the case instrument and sharps counts were confirmed correct. The patient was transferred to the PACU in good condition. I was present for all portions of the procedure.   PLAN: Continue ASA / Statin indefinitely. Needs saphenous vein mapping. Cardiac risk stratification. Plan left above knee popliteal to posterior tibial artery bypass week of 01/14/21.   Yevonne Aline. Stanford Breed, MD [facility] Phone Number: [phone] 01/11/2021 5:19 PM  DG CHEST PORT 1 VIEW  Result Date: 01/09/2021 CLINICAL DATA:  COVID.  Cough.  EXAM: PORTABLE CHEST 1 VIEW COMPARISON:  12/21/2020. FINDINGS: Mediastinum hilar structures normal. Heart size upper limits normal. Low lung volumes. Mild bilateral interstitial prominence. Mild interstitial edema and/or pneumonitis could present in this fashion. No pleural effusion or pneumothorax. IMPRESSION: 1.  Heart size upper limits normal. 2. Low lung volumes. Mild bilateral interstitial prominence. Mild interstitial edema and/or pneumonitis could present this fashion. Electronically Signed   By: Marcello Moores  Register   On: 01/09/2021 08:46   DG Hand Complete Left  Result Date: 12/21/2020 CLINICAL DATA:  Left hand pain. EXAM: LEFT HAND - COMPLETE 3+ VIEW COMPARISON:  Remote radiograph 04/18/2009 FINDINGS: There is no evidence of fracture or dislocation. Mild osteoarthritis of the digits, metacarpal phalangeal joints, as well as thumb interphalangeal joint. No erosion or bone destruction. Portions of the carpal bones are excluded from the AP view. No focal soft tissue abnormality. IMPRESSION: Mild osteoarthritis, primarily involving the digits. Electronically Signed   By: Keith Rake M.D.   On: 12/21/2020 22:40   DG Foot Complete Left  Result Date: 01/08/2021 CLINICAL DATA:  Prior injury. Diabetes. Open wound. Left foot swelling and pain. EXAM: LEFT FOOT - COMPLETE 3+ VIEW COMPARISON:  12/21/2020 11/26/2020. FINDINGS: Soft tissue ulceration noted the heel. No radiopaque foreign body. No bony erosion. No acute or focal bony abnormality. Old medial malleolar fracture fragment noted. IMPRESSION: Soft tissue ulceration over the heel noted. No radiopaque foreign body. No acute bony abnormality identified. Electronically Signed   By: Marcello Moores  Register   On: 01/08/2021 10:22   DG Foot Complete Left  Result Date: 12/21/2020 CLINICAL DATA:  Foot ulcer on heel EXAM: LEFT FOOT - COMPLETE 3+ VIEW COMPARISON:  Foot radiograph 11/26/2020 FINDINGS: No acute osseous abnormality. Minimal scattered degenerative  change. There is Armondo Cech soft tissue defect along the heel with adjacent swelling. Tiny plantar calcaneal spur. Vascular calcifications. IMPRESSION: Soft tissue ulcer along the heel with adjacent soft tissue swelling. No radiographic  evidence of osteomyelitis. Electronically Signed   By: Maurine Simmering   On: 12/21/2020 15:45   VAS [place] ABI WITH/WO TBI  Result Date: 01/09/2021  LOWER EXTREMITY DOPPLER STUDY Patient Name:  BUELL PARCEL  Date of Exam:   01/09/2021 Medical Rec #: [id number]     Accession #:    [id number] Date of Birth: 1965/09/16     Patient Gender: M Patient Age:   34Y Exam Location:  [facility] Procedure:      VAS [place] ABI WITH/WO TBI Referring Phys: [id number] [place] --------------------------------------------------------------------------------  Indications: Rest pain, and ulceration.  Comparison Study: No prior studies. Performing Technologist: Darlin Coco RDMS RVT  Examination Guidelines: Saamir Armstrong complete evaluation includes at minimum, Doppler waveform signals and systolic blood pressure reading at the level of bilateral brachial, anterior tibial, and posterior tibial arteries, when vessel segments are accessible. Bilateral testing is considered an integral part of Shrika Milos complete examination. Photoelectric Plethysmograph (PPG) waveforms and toe systolic pressure readings are included as required and additional duplex testing as needed. Limited examinations for reoccurring indications may be performed as noted.  ABI Findings: +---------+------------------+-----+----------+--------+ Right    Rt Pressure (mmHg)IndexWaveform  Comment  +---------+------------------+-----+----------+--------+ Brachial 137                    triphasic          +---------+------------------+-----+----------+--------+ PTA      181               1.32 biphasic           +---------+------------------+-----+----------+--------+ DP       98                0.72 monophasic          +---------+------------------+-----+----------+--------+ Great Toe52                0.38 Abnormal           +---------+------------------+-----+----------+--------+ +---------+------------------+-----+-------------------+-------+ Left     Lt Pressure (mmHg)IndexWaveform           Comment +---------+------------------+-----+-------------------+-------+ Brachial 134                    triphasic                  +---------+------------------+-----+-------------------+-------+ PTA      50                0.36 dampened monophasic        +---------+------------------+-----+-------------------+-------+ DP       0                 0.00 absent                     +---------+------------------+-----+-------------------+-------+ Great Toe0                 0.00 Absent                     +---------+------------------+-----+-------------------+-------+ +-------+-----------+-----------+------------+------------+ ABI/TBIToday's ABIToday's TBIPrevious ABIPrevious TBI +-------+-----------+-----------+------------+------------+ Right  1.32       0.38                                +-------+-----------+-----------+------------+------------+ Left   0.36       0.00                                +-------+-----------+-----------+------------+------------+  Arterial wall calcification precludes accurate ankle pressures and ABIs.  Summary: Right: Resting right ankle-brachial index indicates noncompressible right lower extremity arteries. The right toe-brachial index is abnormal. Left: Resting left ankle-brachial index indicates severe left lower extremity arterial disease. The left toe-brachial index is absent.  *See table(s) above for measurements and observations.  Electronically signed by Jamelle Haring on 01/09/2021 at 5:23:44 PM.    Final    VAS [place] LOWER EXTREMITY SAPHENOUS VEIN MAPPING  Result Date: 01/13/2021 LOWER EXTREMITY VEIN MAPPING Patient Name:  HADDEN STEIG  Date of Exam:    01/12/2021 Medical Rec #: [id number]     Accession #:    [id number] Date of Birth: 04/19/1966     Patient Gender: M Patient Age:   55Y Exam Location:  [facility] Procedure:      VAS [place] LOWER EXTREMITY SAPHENOUS VEIN MAPPING Referring Phys: [id number] Cherre Robins --------------------------------------------------------------------------------  Indications:       Pre-op Other Indications: Rest pain of left lower extremity. Ulcerations. Risk Factors:      PAD.  Comparison Study: No prior study Performing Technologist: Sharion Dove RVS  Examination Guidelines: Zed Wanninger complete evaluation includes B-mode imaging, spectral Doppler, color Doppler, and power Doppler as needed of all accessible portions of each vessel. Bilateral testing is considered an integral part of Gwenn Teodoro complete examination. Limited examinations for reoccurring indications may be performed as noted. +---------------+-----------+----------------------+---------------+-----------+   RT Diameter  RT Findings         GSV            LT Diameter  LT Findings      (cm)                                            (cm)                  +---------------+-----------+----------------------+---------------+-----------+      0.44                     Saphenofemoral         0.29                                                   Junction                                  +---------------+-----------+----------------------+---------------+-----------+      0.34                     Proximal thigh         0.22                  +---------------+-----------+----------------------+---------------+-----------+      2.32       branching       Mid thigh            0.22                  +---------------+-----------+----------------------+---------------+-----------+      0.32                      Distal thigh  0.26                  +---------------+-----------+----------------------+---------------+-----------+      0.32                           Knee              0.30                  +---------------+-----------+----------------------+---------------+-----------+      0.24                       Prox calf            0.30       branching  +---------------+-----------+----------------------+---------------+-----------+      0.22                        Mid calf            0.33                  +---------------+-----------+----------------------+---------------+-----------+      0.29                      Distal calf           0.21                  +---------------+-----------+----------------------+---------------+-----------+      0.24                         Ankle              0.29                  +---------------+-----------+----------------------+---------------+-----------+ Diagnosing physician: Jamelle Haring Electronically signed by Jamelle Haring on 01/13/2021 at 1:05:53 PM.    Final    VAS [place] LOWER EXTREMITY ARTERIAL DUPLEX  Result Date: 01/09/2021 LOWER EXTREMITY ARTERIAL DUPLEX STUDY Patient Name:  Tony Hicks  Date of Exam:   01/09/2021 Medical Rec #: [id number]     Accession #:    [id number] Date of Birth: [birth year]     Patient Gender: M Patient Age:   53Y Exam Location:  [facility] Procedure:      VAS [place] LOWER EXTREMITY ARTERIAL DUPLEX Referring Phys: [id number] Karoline Caldwell --------------------------------------------------------------------------------  Indications: Rest pain. High Risk Factors: Diabetes. Other Factors: Tobacco and substance abuse.  Current ABI: R: 1.3 TBI 0.38 L:0.36 TBI 0.0 Limitations: Rest pain, patient constantly moving, involuntary kicking Comparison Study: No prior study Performing Technologist: Sharion Dove RVS  Examination Guidelines: Dajanique Robley complete evaluation includes B-mode imaging, spectral Doppler, color Doppler, and power Doppler as needed of all accessible portions of each vessel. Bilateral testing is considered an integral part of Kearney Evitt complete examination. Limited  examinations for reoccurring indications may be performed as noted.  +----------+--------+-----+--------+-------------------+--------+ LEFT      PSV cm/sRatioStenosisWaveform           Comments +----------+--------+-----+--------+-------------------+--------+ CFA Prox  141                                              +----------+--------+-----+--------+-------------------+--------+ DFA       69                                               +----------+--------+-----+--------+-------------------+--------+  SFA Prox  62                                               +----------+--------+-----+--------+-------------------+--------+ SFA Mid   122                                              +----------+--------+-----+--------+-------------------+--------+ SFA Distal29                                               +----------+--------+-----+--------+-------------------+--------+ POP Prox  105                                              +----------+--------+-----+--------+-------------------+--------+ POP Mid                        absent                      +----------+--------+-----+--------+-------------------+--------+ POP Distal                     absent                      +----------+--------+-----+--------+-------------------+--------+ ATA Distal21                   dampened monophasic         +----------+--------+-----+--------+-------------------+--------+ PTA Prox  25                   dampened monophasic         +----------+--------+-----+--------+-------------------+--------+   Summary: Left: Stenosis noted mid SFA and distal FA/proximal popliteal artery. Mid to distal popliteal artery appears occluded. Minimal flow noted in the AT and PT arteries.  See table(s) above for measurements and observations. Electronically signed by Jamelle Haring on 01/09/2021 at 5:25:28 PM.    Final     Microbiology: No results found for this or any  previous visit (from the past 240 hour(s)).   Labs: Basic Metabolic Panel: Recent Labs  Lab 01/15/21 0500 01/16/21 0634 01/17/21 0130 01/18/21 0139 01/19/21 0113  NA 134* 133* 132* 132* 132*  K 4.2 3.9 3.9 3.7 3.8  CL 103 99 102 100 101  CO2 _0 GLUCOSE 95 103* 110* 129* 115*  BUN _1 CREATININE 0.63 0.70 0.72 0.61 0.59*  CALCIUM 8.9 9.1 8.9 9.1 9.0  MG  --   --  1.8 1.9 1.8  PHOS  --   --  4.3 4.3 4.4   Liver Function Tests: Recent Labs  Lab 01/17/21 0130 01/18/21 0139 01/19/21 0113  AST 13* 13* 13*  ALT _2 ALKPHOS 40 38 38  BILITOT 0.6 0.1* 0.4  PROT 6.3* 6.4* 6.4*  ALBUMIN 2.7* 2.8* 2.8*   No results for input(s): LIPASE, AMYLASE in the last 168 hours. No results for input(s): AMMONIA in the last 168 hours. CBC: Recent Labs  Lab  01/15/21 0500 01/16/21 0229 01/17/21 0130 01/18/21 0139 01/19/21 0113  WBC 13.4* 9.4 6.8 7.2 7.0  NEUTROABS  --   --  4.2 4.6 4.4  HGB 7.8* 7.9* 7.7* 7.4* 7.8*  HCT 25.3* 26.0* 25.2* 25.1* 25.4*  MCV 66.2* 67.9* 68.1* 68.6* 67.9*  PLT 322 315 298 378 397   Cardiac Enzymes: No results for input(s): CKTOTAL, CKMB, CKMBINDEX, TROPONINI in the last 168 hours. BNP: BNP (last 3 results) No results for input(s): BNP in the last 8760 hours.  ProBNP (last 3 results) No results for input(s): PROBNP in the last 8760 hours.  CBG: Recent Labs  Lab 01/18/21 1353 01/18/21 1708 01/18/21 2148 01/19/21 0623 01/19/21 1136  GLUCAP 120* 107* 120* 125* 115*       Signed:  Fayrene Helper MD.  [facility] 01/19/2021, 2:33 PM

## 2021-01-19 NOTE — Progress Notes (Signed)
Medication/discharge instruction given to pt.. Pt had no questions,transportation called.  Pt educated on dressing change . Paper prescriptions given to pt with MATCH letter. Doctors note printed and given to pt.Will continue to monitor   Everlean Cherry, RN

## 2021-01-19 NOTE — TOC Transition Note (Signed)
Transition of Care [facility]) - CM/SW Discharge Note   Patient Details  Name: Tony Hicks MRN: [id number] Date of Birth: 01-May-1966  [facility]) CM/SW Contact:  Kallie Locks, RN Phone Number: [phone] 01/19/2021, 3:14 PM   Clinical Narrative:   Patient states he is going to stay with is mother after dc for about 3 days. LCSW is following for social concerns.   MATCH letter provided to patient at bedside for medication assistance. He declined need for 3 in 1. Requested crutches which were delivered to bedside by ortho tech. Rolling walker ordered and to be delivered to room before dc. Due to not having insurance, patient will receive rolling walker under charity.  Community health clinic telephone numbers provided to patient as well.    Final next level of care: Home/Self Care Barriers to Discharge: No Barriers Identified   Patient Goals and CMS Choice Patient states their goals for this hospitalization and ongoing recovery are:: to get better CMS Medicare.gov Compare Post Acute Care list provided to:: Patient    Discharge Placement                       Discharge Plan and Services                DME Arranged: Crutches, Walker rolling DME Agency: [employer] Date DME Agency Contacted: 01/19/21 Time DME Agency Contacted: 1450 Representative spoke with at DME Agency: Leavy Cella            Social Determinants of Health (SDOH) Interventions     Readmission Risk Interventions No flowsheet data found.

## 2021-01-19 NOTE — Progress Notes (Signed)
Orthopedic Tech Progress Note Patient Details:  Tony Hicks 10-05-1965 [id number]  Ortho Devices Type of Ortho Device: Crutches Ortho Device/Splint Interventions: Ordered, Adjustment      Aniya Jolicoeur E Sydney Hasten 01/19/2021, 3:11 PM

## 2021-01-19 NOTE — Progress Notes (Signed)
Physical Therapy Treatment Patient Details Name: Tony Hicks MRN: [id number] DOB: [birth year] Today's Date: 01/19/2021    History of Present Illness Pt is a 55 y.o. admitted 01/08/21 with L foot pain and nonhealing wounds. Notable workup for sepsis, cellulitis, incidental (+) COVID-19. Pt s/p LLE angiogram, aortogram on 7/8. S/p L popliteal to posterior tibial bypass on 7/11. PMH includes DM2, polysubstance abuse.   PT Comments    Pt progressing with mobility. Pt mod indep with transfers and short ambulation distances with RW; declined formal stair training. Pt continues to maintain L foot NWB secondary to pain from ulcer. Reviewed educ re: edema control, positioning, activity recommendations (HEP handout provided). Pt preparing for d/c home this afternoon; reports no further questions or concerns. If pt to remain admitted, will continue to follow acutely.   Follow Up Recommendations  No PT follow up     Equipment Recommendations  Rolling walker with 5" wheels    Recommendations for Other Services       Precautions / Restrictions Precautions Precautions: Fall;Other (comment) Precaution Comments: L heel ulcer Restrictions Weight Bearing Restrictions: No Other Position/Activity Restrictions: No formal WB restrictions - pt maintains L foot NWB by choice secondary to pain from L heel ulcer    Mobility  Bed Mobility Overal bed mobility: Independent                  Transfers Overall transfer level: Modified independent Equipment used: Rolling walker (2 wheeled) Transfers: Sit to/from Stand           General transfer comment: Mod indep standing from EOB and recliner with RW; little to no WB through L foot, heavy reliance on UE support  Ambulation/Gait Ambulation/Gait assistance: Modified independent (Device/Increase time) Gait Distance (Feet): 20 Feet Assistive device: Rolling walker (2 wheeled) Gait Pattern/deviations: Step-to pattern;Antalgic;Trunk flexed Gait  velocity: Decreased   General Gait Details: Slow, antalgic gait mod indep with RW; pt able to mobilize to recliner and to/from bathroom without assist; prefers to put little to no weight through L foot secondary to pain from ulcer   Stairs Stairs:  (pt declined stair training)           Wheelchair Mobility    Modified Rankin (Stroke Patients Only)       Balance Overall balance assessment: Needs assistance   Sitting balance-Leahy Scale: Good     Standing balance support: No upper extremity supported;Bilateral upper extremity supported;During functional activity Standing balance-Leahy Scale: Fair Standing balance comment: Can static stand without UE support with L foot touching ground; reliant on RW for single leg stance on RLE                            Cognition Arousal/Alertness: Awake/alert Behavior During Therapy: WFL for tasks assessed/performed Overall Cognitive Status: Within Functional Limits for tasks assessed                                        Exercises Other Exercises Other Exercises: [employer] HEP handout (Access Code [email or web address]) provided - SLR, sidelying hip abd, glut squeeze, LAQ    General Comments General comments (skin integrity, edema, etc.): Reinforced resting with L foot elevation. Pt preparing for d/c home this afternoon, hopeful to stay at mother's home for a few days since he can no longer afford hotel      Pertinent Vitals/Pain Pain Assessment:  Faces Faces Pain Scale: Hurts a little bit Pain Location: L foot Pain Descriptors / Indicators: Sore;Guarding Pain Intervention(s): Monitored during session;Repositioned    Home Living                      Prior Function            PT Goals (current goals can now be found in the care plan section) Progress towards PT goals: Progressing toward goals    Frequency    Min 2X/week      PT Plan Current plan remains appropriate    Co-evaluation               AM-PAC PT "6 Clicks" Mobility   Outcome Measure  Help needed turning from your back to your side while in a flat bed without using bedrails?: None Help needed moving from lying on your back to sitting on the side of a flat bed without using bedrails?: None Help needed moving to and from a bed to a chair (including a wheelchair)?: None Help needed standing up from a chair using your arms (e.g., wheelchair or bedside chair)?: None Help needed to walk in hospital room?: None Help needed climbing 3-5 steps with a railing? : A Little 6 Click Score: 23    End of Session   Activity Tolerance: Patient tolerated treatment well;Patient limited by pain Patient left: in chair;with call bell/phone within reach Nurse Communication: Mobility status PT Visit Diagnosis: Difficulty in walking, not elsewhere classified (R26.2);Pain Pain - Right/Left: Left Pain - part of body: Ankle and joints of foot     Time: 0923-3007 PT Time Calculation (min) (ACUTE ONLY): 15 min  Charges:  $Self Care/Home Management: 8-22                     Ina Homes, PT, DPT Acute Rehabilitation Services  Pager [phone] Office [phone]  [name] 01/19/2021, 3:13 PM

## 2021-01-21 ENCOUNTER — Telehealth: Payer: Self-pay

## 2021-01-21 NOTE — Telephone Encounter (Signed)
Patient called stating he was told to call and speak with Ricky Stabs NP when he was out of the hospital. Patient stated he was discharged 7.16.2022. Patient is asking for Amy to return his call.

## 2021-01-21 NOTE — Telephone Encounter (Signed)
Transition Care Management Follow-up Telephone Call Date of discharge and from where: 7/162022, [facility]  How have you been since you were released from the hospital? He said there is still swelling in his leg and he knows that is to be expected after the procedure. He tries to keep his leg elevated as much as possible during the day and he said he has to have it propped up at night in order to sleep.  Any questions or concerns? Yes - noted above  Items Reviewed: Did the pt receive and understand the discharge instructions provided? Yes  Medications obtained and verified? Yes - he said he has all medications including the new ones and did not have any questions about his med regime.  Other? No  Any new allergies since your discharge? No  Dietary orders reviewed? No Do you have support at home?  He is currently staying with his mother but can only stay for a few more days.  He does not have an income so is not able to afford housing. He is not sure where he will be staying when he needs to leave his mother's home. He is currently working with an attorney to submit a disability application.  He spoke about needing copies of his hospital medical records, Instructed him to speak to his attorney about this issue.   Home Care and Equipment/Supplies: Were home health services ordered? no If so, what is the name of the agency? N/a  Has the agency set up a time to come to the patient's home? not applicable Were any new equipment or medical supplies ordered?  Yes: RW What is the name of the medical supply agency? [employer]- charity Were you able to get the supplies/equipment? yes Do you have any questions related to the use of the equipment or supplies? No  He said that he has all wound care supplies including the collagenase and he is able to do the wet to dry dressing changes to his left heel independently.   Functional Questionnaire: (I = Independent and D = Dependent) ADLs:  independent. Using RW with ambulation. He said he is able to get out of the house when needed.   Follow up appointments reviewed:  PCP Hospital f/u appt confirmed? Yes  Scheduled to see Ricky Stabs, NP on 01/28/2021 - virtual visit. Specialist Hospital f/u appt confirmed? Yes  Scheduled to see  wound care - 02/04/2021; VVS - 02/05/2021.  Are transportation arrangements needed?  Sometimes. He is interested in applying for [employer].  Rides can also be arranged through [facility]  If their condition worsens, is the pt aware to call PCP or go to the Emergency Dept.? Yes Was the patient provided with contact information for the PCP's office or ED? Yes Was to pt encouraged to call back with questions or concerns? Yes

## 2021-01-22 ENCOUNTER — Other Ambulatory Visit: Payer: Self-pay

## 2021-01-22 ENCOUNTER — Emergency Department (HOSPITAL_COMMUNITY)
Admission: EM | Admit: 2021-01-22 | Discharge: 2021-01-23 | Disposition: A | Discharge status: Home / Self Care | Payer: Self-pay | Attending: Emergency Medicine | Admitting: Emergency Medicine

## 2021-01-22 ENCOUNTER — Emergency Department (HOSPITAL_COMMUNITY)
Admission: EM | Admit: 2021-01-22 | Discharge: 2021-01-22 | Disposition: A | Discharge status: Home / Self Care | Payer: MEDICAID

## 2021-01-22 DIAGNOSIS — E119 Type 2 diabetes mellitus without complications: Secondary | ICD-10-CM | POA: Insufficient documentation

## 2021-01-22 DIAGNOSIS — F17210 Nicotine dependence, cigarettes, uncomplicated: Secondary | ICD-10-CM | POA: Insufficient documentation

## 2021-01-22 DIAGNOSIS — K5903 Drug induced constipation: Secondary | ICD-10-CM | POA: Insufficient documentation

## 2021-01-22 LAB — COMPREHENSIVE METABOLIC PANEL
ALT: 21 U/L (ref 0–44)
AST: 15 U/L (ref 15–41)
Albumin: 3.2 g/dL — ABNORMAL LOW (ref 3.5–5.0)
Alkaline Phosphatase: 47 U/L (ref 38–126)
Anion gap: 9 (ref 5–15)
BUN: 10 mg/dL (ref 6–20)
CO2: 24 mmol/L (ref 22–32)
Calcium: 9.2 mg/dL (ref 8.9–10.3)
Chloride: 102 mmol/L (ref 98–111)
Creatinine, Ser: 0.7 mg/dL (ref 0.61–1.24)
GFR, Estimated: >60 mL/min (ref >60)
Glucose, Bld: 133 mg/dL — ABNORMAL HIGH (ref 70–99)
Potassium: 3.8 mmol/L (ref 3.5–5.1)
Sodium: 135 mmol/L (ref 135–145)
Total Bilirubin: 0.1 mg/dL — ABNORMAL LOW (ref 0.3–1.2)
Total Protein: 6.9 g/dL (ref 6.5–8.1)

## 2021-01-22 LAB — CBC WITH DIFFERENTIAL/PLATELET
Abs Immature Granulocytes: 0.03 10*3/uL (ref 0.00–0.07)
Basophils Absolute: 0.1 10*3/uL (ref 0.0–0.1)
Basophils Relative: 1 %
Eosinophils Absolute: 0.1 10*3/uL (ref 0.0–0.5)
Eosinophils Relative: 1 %
HCT: 29.1 % — ABNORMAL LOW (ref 39.0–52.0)
Hemoglobin: 8.8 g/dL — ABNORMAL LOW (ref 13.0–17.0)
Immature Granulocytes: 1 %
Lymphocytes Relative: 36 %
Lymphs Abs: 2.2 10*3/uL (ref 0.7–4.0)
MCH: 21.2 pg — ABNORMAL LOW (ref 26.0–34.0)
MCHC: 30.2 g/dL (ref 30.0–36.0)
MCV: 70.1 fL — ABNORMAL LOW (ref 80.0–100.0)
Monocytes Absolute: 0.5 10*3/uL (ref 0.1–1.0)
Monocytes Relative: 9 %
Neutro Abs: 3.2 10*3/uL (ref 1.7–7.7)
Neutrophils Relative %: 52 %
Platelets: 564 10*3/uL — ABNORMAL HIGH (ref 150–400)
RBC: 4.15 MIL/uL — ABNORMAL LOW (ref 4.22–5.81)
RDW: 26.9 % — ABNORMAL HIGH (ref 11.5–15.5)
WBC: 6.1 10*3/uL (ref 4.0–10.5)
nRBC: 0 % (ref 0.0–0.2)

## 2021-01-22 LAB — LACTIC ACID, PLASMA: Lactic Acid, Venous: 1.3 mmol/L (ref 0.5–1.9)

## 2021-01-22 NOTE — ED Triage Notes (Signed)
Constipation x 5 days after being discharged. Took stool softener but no relief.  Pt takes oxycodone for left leg pain.   Also reports increased left leg swelling (post op site) . Denies any fevers.

## 2021-01-22 NOTE — ED Provider Notes (Signed)
Emergency Medicine Provider Triage Evaluation Note  Tony Hicks , a 55 y.o. male  was evaluated in triage.  Pt complains of presents with constipation x5 days after being discharged from the hospital postoperatively.  Had surgery to the left leg due to cellulitis. Pain and has not been able to have a bowel movement x5 days.  Does endorse passing intermittent flatus but decreased.  Has been taking oxycodone for the pain.  Discharged after hospitalization for sepsis with cellulitis of the diabetic foot ulcer.  Endorses chronic pain in the foot and worsening odor.  Review of Systems  Positive: Constipation, postoperative left foot pain and foul odor Negative: Nausea, vomiting, anorexia, dysuria, urinary frequency or urgency.  Physical Exam  BP 133/77 (BP Location: Right Arm)   Pulse 89   Temp 98.1 F (36.7 C)   Resp 15   Ht 5\' 6"  (1.676 m)   Wt 89.3 kg   SpO2 100%   BMI 31.78 kg/m  Gen:   Awake, no distress   Resp:  Normal effort  MSK:   Moves extremities without difficulty  Other:  Generalized abdominal mild tenderness to palpation without rebound or guarding.  Medical Decision Making  Medically screening exam initiated at 9:11 PM.  Appropriate orders placed.  Tony Hicks was informed that the remainder of the evaluation will be completed by another provider, this initial triage assessment does not replace that evaluation, and the importance of remaining in the ED until their evaluation is complete.  This chart was dictated using voice recognition software, Dragon. Despite the best efforts of this provider to proofread and correct errors, errors may still occur which can change documentation meaning.    Tony Hicks 01/22/21 2112    [place], 2113, MD 01/22/21 2203

## 2021-01-23 NOTE — Discharge Instructions (Addendum)
You were evaluated in the Emergency Department and after careful evaluation, we did not find any emergent condition requiring admission or further testing in the hospital.  Your exam/testing today was overall reassuring.  Recommend over-the-counter MiraLAX or polyethylene glycol, to be taken up to 6 times daily until you achieve soft frequent stools.  At that time you can decrease to once or twice a day.  Please return to the Emergency Department if you experience any worsening of your condition.  Thank you for allowing [place] to be a part of your care.

## 2021-01-23 NOTE — Anesthesia Postprocedure Evaluation (Signed)
Anesthesia Post Note  Patient: Laurita Quint  Procedure(s) Performed: LEFT ABOVE KNEE TO  POSTERIOR TIBIAL BYPASS GRAFT WITH GREATER SAPHENOUS VEIN (Left: Leg Lower)     Patient location during evaluation: PACU Anesthesia Type: General Level of consciousness: awake and alert Pain management: pain level controlled Vital Signs Assessment: post-procedure vital signs reviewed and stable Respiratory status: spontaneous breathing, nonlabored ventilation, respiratory function stable and patient connected to nasal cannula oxygen Cardiovascular status: blood pressure returned to baseline and stable Postop Assessment: no apparent nausea or vomiting Anesthetic complications: no   No notable events documented.  Last Vitals:   Reviewed and Stable              Maelie Chriswell

## 2021-01-23 NOTE — ED Provider Notes (Signed)
[facility]-EMER[facility] DEPT [facility] Emergency Department Provider Note MRN:  [id number]  Arrival date & time: 01/23/21     Chief Complaint   Constipation and Post-op Problem   History of Present Illness   Tony Hicks is a 55 y.o. year-old male with a history of diabetes presenting to the ED with chief complaint of constipation.  5 days of minimal bowel movements.  Has been taking oxycodone postoperatively.  Had surgery on his leg for an infection.  Noticing some swelling to the leg over the past few days since the surgery but not significantly changed today or yesterday.  Endorsing some abdominal bloating.  Passed a small firm stool yesterday but none today.  Symptoms mild, constant, no exacerbating or alleviating factors.  Denies any fever, no leg pain, no chest pain, no other complaints.  Review of Systems  A complete 10 system review of systems was obtained and all systems are negative except as noted in the HPI and PMH.   Patient's Health History    Past Medical History:  Diagnosis Date   Diabetes mellitus without complication (HCC)    Syphilis    Treated  02/14/2019    Past Surgical History:  Procedure Laterality Date   ABDOMINAL AORTOGRAM W/LOWER EXTREMITY N/A 01/11/2021   Procedure: ABDOMINAL AORTOGRAM W/LOWER EXTREMITY;  Surgeon: Leonie Douglas, MD;  Location: [facility] INVASIVE CV LAB;  Service: Cardiovascular;  Laterality: N/A;   NO PAST SURGERIES     THROMBECTOMY OF BYPASS GRAFT FEMORAL- TIBIAL ARTERY Left 01/14/2021   Procedure: LEFT ABOVE KNEE TO  POSTERIOR TIBIAL BYPASS GRAFT WITH GREATER SAPHENOUS VEIN;  Surgeon: Cephus Shelling, MD;  Location: [facility] OR;  Service: Vascular;  Laterality: Left;    Family History  Family history unknown: Yes    Social History   Socioeconomic History   Marital status: Single    Spouse name: Not on file   Number of children: Not on file   Years of education: Not on file   Highest education level: Not on file  Occupational History    Occupation: Holiday representative  Tobacco Use   Smoking status: Every Day    Packs/day: 0.50    Types: Cigarettes   Smokeless tobacco: Never  Vaping Use   Vaping Use: Never used  Substance and Sexual Activity   Alcohol use: Yes    Alcohol/week: 24.0 standard drinks    Types: 4 Cans of beer, 20 Standard drinks or equivalent per week    Comment: Approx 20-25 beers week   Drug use: Yes    Frequency: 1.0 times per week    Types: Cocaine    Comment: smoke crack   Sexual activity: Not Currently  Other Topics Concern   Not on file  Social History Narrative   Not on file   Social Determinants of Health   Financial Resource Strain: Not on file  Food Insecurity: Not on file  Transportation Needs: Not on file  Physical Activity: Not on file  Stress: Not on file  Social Connections: Not on file  Intimate Partner Violence: Not on file     Physical Exam   Vitals:   01/22/21 2004 01/23/21 0018  BP: 133/77 108/70  Pulse: 89 87  Resp: 15 16  Temp: 98.1 F (36.7 C)   SpO2: 100% 100%    CONSTITUTIONAL: Chronically ill-appearing, NAD NEURO:  Alert and oriented x 3, no focal deficits EYES:  eyes equal and reactive ENT/NECK:  no LAD, no JVD CARDIO: Regular rate, well-perfused, normal S1 and  S2 PULM:  CTAB no wheezing or rhonchi GI/GU:  normal bowel sounds, non-distended, non-tender MSK/SPINE:  No gross deformities, no edema SKIN:  no rash, atraumatic; left leg with clean bandage on the heel, some mild swelling noted to the extremity, nontender, no increased warmth PSYCH:  Appropriate speech and behavior  *Additional and/or pertinent findings included in MDM below  Diagnostic and Interventional Summary    EKG Interpretation  Date/Time:    Ventricular Rate:    PR Interval:    QRS Duration:   QT Interval:    QTC Calculation:   R Axis:     Text Interpretation:         Labs Reviewed  COMPREHENSIVE METABOLIC PANEL - Abnormal; Notable for the following components:      Result  Value   Glucose, Bld 133 (*)    Albumin 3.2 (*)    Total Bilirubin 0.1 (*)    All other components within normal limits  CBC WITH DIFFERENTIAL/PLATELET - Abnormal; Notable for the following components:   RBC 4.15 (*)    Hemoglobin 8.8 (*)    HCT 29.1 (*)    MCV 70.1 (*)    MCH 21.2 (*)    RDW 26.9 (*)    Platelets 564 (*)    All other components within normal limits  LACTIC ACID, PLASMA  LACTIC ACID, PLASMA    No orders to display    Medications - No data to display   Procedures  /  Critical Care Procedures  ED Course and Medical Decision Making  I have reviewed the triage vital signs, the nursing notes, and pertinent available records from the EMR.  Listed above are laboratory and imaging tests that I personally ordered, reviewed, and interpreted and then considered in my medical decision making (see below for details).  Patient here mostly for constipation, which seems mild.  Abdomen soft and nontender with no rebound guarding or rigidity.  Labs reassuring, vitals normal.  Leg with some minimal swelling which is somewhat expected in the postoperative phase, doubt PE, no signs of new or worsening infection, patient is appropriate for discharge.       Elmer Sow. Pilar Plate, MD [facility] Emergency Medicine [facility] [email or web address]  Final Clinical Impressions(s) / ED Diagnoses     ICD-10-CM   1. Drug-induced constipation  K59.03       ED Discharge Orders     None        Discharge Instructions Discussed with and Provided to Patient:    Discharge Instructions      You were evaluated in the Emergency Department and after careful evaluation, we did not find any emergent condition requiring admission or further testing in the hospital.  Your exam/testing today was overall reassuring.  Recommend over-the-counter MiraLAX or polyethylene glycol, to be taken up to 6 times daily until you achieve soft frequent stools.  At that time you can decrease  to once or twice a day.  Please return to the Emergency Department if you experience any worsening of your condition.  Thank you for allowing [place] to be a part of your care.        Sabas Sous, MD 01/23/21 [id number]

## 2021-01-23 NOTE — Anesthesia Postprocedure Evaluation (Signed)
Anesthesia Post Note  Patient: Tony Hicks  Procedure(s) Performed: LEFT ABOVE KNEE TO  POSTERIOR TIBIAL BYPASS GRAFT WITH GREATER SAPHENOUS VEIN (Left: Leg Lower)     Anesthesia Post Evaluation No notable events documented.  Last Vitals:  Reviewed and Stable              Jaala Bohle

## 2021-01-26 NOTE — Progress Notes (Signed)
TRANSITION OF CARE VISIT   Primary Care Provider (PCP):  Durene Fruits, NP                                                    [facility] at [facility] [address] [place],  [place]  [address] Phone: [phone] Fax: [phone]    Date of Admission: 01/08/2021  Date of Discharge: 01/19/2021  Transitions of Care Call: 01/21/2021 with Eden Lathe, RN  Discharged from: [facility]   Discharge Diagnosis:  Principal Problem:   Diabetic foot ulcer ([place]) Active Problems:   Polysubstance abuse ([place])   Hyponatremia   Microcytic anemia   Diabetes mellitus type 2, controlled ([place])   PAD (peripheral artery disease) ([place])   Recommendations for Outpatient Follow-up:  Follow outpatient CBC/CMP Continue aspirin and statin Continue wound care as prescribed Follow up with vascular surgery as an outpatient Follow up with wound care center outpatient Encourage cessation from cocaine, tobacco, alcohol, etc  Summary of Admission:  55 year old male with DM2, polysubstance abuse (tobacco, alcohol, cocaine) who comes in with worsening left foot pain.  He has several ulcers there present for the last 2 months, seen by podiatry but overall without improvement.  Pain is gotten worse, ulcers have been getting larger and he has noticed some purulent discharges.  He eventually came to the ER and was admitted to the hospital.  He was found to have COVID-19, incidental, without symptoms.  Podiatry and vascular surgery consulted, he has significant PAD and he is status post left above-knee popliteal artery to posterior tibial artery bypass at the ankle with reversed ipsilateral great saphenous vein on 7/11 by Dr [name].  Podiatry followed post op and recommended enzymatic debridement and recommended follow up at wound care center outpatient.  MRI did not show evidence of deep infection.  Plan for discharge on abx  with outpatient follow up.  He's going to be d/c'd home to his mother's house, leaving with taxi voucher.   Hospital Course:  Sepsis secondary to diabetic foot ulcer with cellulitis-febrile in the ED, was tachycardic, had elevated lactic acid with likely source from his diabetic foot ulcer.  Podiatry was consulted on admission. -ABI showed severe vascular disease, vascular surgery consulted. Underwent arteriogram on 7/8 with popliteal artery occlusion on the left lower extremity behind the knee, and was taken to OR 7/11 for left above-knee popliteal to posterior tibial artery bypass. -CT scan of the foot with ulceration of the lateral aspect of the heel but no underlying abscess, osteomyelitis or anything else acute - elevated ESR/CRP - MRI with soft tissue ulceration along lateral aspect of heel - no osteo, septic joint, or abscess - transitioned to oral abx 7/13 - has already had abx since 7/5.  Will discharge with another 5 days, no deep infection noted on imaging.  Follow outpatient.   -Podiatry planning to follow outpatient, planning enzymatic debridement at this time - apply santyl daily, nickel thick, followed by wet to dry dressing -PT/OT consultation pending   COVID-19-remains asymptomatic.  Needs 10 days of isolation from 7/5 when he was first diagnosed.  Can d/c isolation at this time.   Type 2 diabetes mellitus, with hyperglycemia-A1c 6.2.  Patient was placed on sliding scale.  Monitor CBGs Resume metformin at discharge   Microcytic anemia, iron  deficiency anemia-monitor, received IV iron, will place on oral iron on discharge.  Hemoglobin 7.8, stable from yesterday.  No bleeding, transfuse if less than 7 D/c with iron  Hyponatremia-monitor, query related to EtOH.  Overall stable   Polysubstance abuse-Patient admits to drinking significant amount of alcohol most days, smoking tobacco, and snorting cocaine recently. CIWA protocols initiated, does not appear to be  withdrawing Encourage cessation   TOC team assistance with cost of meds, d/c plan as sounds like he is without a home   Procedures: 7/11 Procedure: 1.  Harvest of left leg great saphenous vein 2.  Left above-knee popliteal artery to posterior tibial artery bypass at the ankle with reversed ipsilateral great saphenous vein   PROCEDURE:   1) US guided right common femoral artery access 2) Aortogram 3) left lower extremity angiogram with second order cannulation (57mL total contrast) 4) Conscious sedation (31 minutes)   LLE arterial duplex Summary:  Left: Stenosis noted mid SFA and distal FA/proximal popliteal artery. Mid  to distal popliteal artery appears occluded. Minimal flow noted in the AT  and PT arteries.   ABI Summary:  Right: Resting right ankle-brachial index indicates noncompressible right  lower extremity arteries. The right toe-brachial index is abnormal.   Left: Resting left ankle-brachial index indicates severe left lower  extremity arterial disease. The left toe-brachial index is absent   TODAY's VISIT DIABETIC FOOT ULCER FOLLOW-UP: DIABETES TYPE 2 FOLLOW-UP: PAD FOLLOW-UP: Doing ok since hospital discharge. Finished antibiotics from hospital discharge (Augmentin and Doxycycline). Still having pain and some swelling. Reports the only pills that help with pain is the Oxycodone he was given at hospital discharge but only given a short supply. Ibuprofen doesn't help. Still taking Aspirin and Atorvastatin. He is aware of foot doctor appointment on 02/04/2021. He was not aware of Vascular appointment on 02/05/2021. He is doing daily dressing changes. Using rolling walker but mostly crutches for ambulation. Has not heard from PT/OT since hospital discharge. Last A1C: 6.2%  on 01/08/2021 Med Adherence:  $RemoveBef'[x]'GrEociwSft$  Yes    '[]'$  No Medication side effects:  $RemoveB'[]'yuYEhRXl$  Yes    '[x]'$  No Diet Adherence: $RemoveBeforeD'[x]'bwDSvEemYRQucQ$  Yes    '[]'$  No Exercise: $RemoveBefo'[]'KKRmfNvshAM$  Yes    '[x]'$  No  4. ANEMIA FOLLOW-UP: Still taking oral iron  pill.  5. HYPONATREMIA FOLLOW-UP: Sodium normal at hospital discharge.   6. POLYSUBSTANCE ABUSE:  Smoking 1 cigarette daily. No longer using cocaine or drinking alcohol.  Patient/Caregiver self-reported problems/concerns: No  MEDICATIONS  Medication Reconciliation conducted with patient/caregiver? (Yes/ No):Yes   New medications prescribed/discontinued upon discharge? (Yes/No): Yes  Barriers identified related to medications: No  LABS  Lab Reviewed (Yes/No/NA): Yes  PHYSICAL EXAM:  Physical Exam HENT:     Head: Normocephalic and atraumatic.  Eyes:     Extraocular Movements: Extraocular movements intact.     Conjunctiva/sclera: Conjunctivae normal.     Pupils: Pupils are equal, round, and reactive to light.  Cardiovascular:     Rate and Rhythm: Normal rate and regular rhythm.     Pulses: Normal pulses.     Heart sounds: Normal heart sounds.  Pulmonary:     Effort: Pulmonary effort is normal.     Breath sounds: Normal breath sounds.  Musculoskeletal:     Cervical back: Normal range of motion and neck supple.     Comments: Left foot wrapped in ace bandage.  Neurological:     General: No focal deficit present.     Mental Status: He is alert and oriented to  person, place, and time.  Psychiatric:        Mood and Affect: Mood normal.        Behavior: Behavior normal.    ASSESSMENT & PLAN: 1. Hospital discharge follow-up: - Reviewed hospital course, current medications, ensured proper follow-up in place, and addressed concerns.   2. Diabetic ulcer of left heel associated with type 2 diabetes mellitus, unspecified ulcer stage [facility]): - Patient ambulating with rolling walker but mostly crutches.  - Keep appointment scheduled 02/04/2021 with Kalman Shan, DO at [facility]. - Referral to Physical Therapy for further evaluation and management.  - Ambulatory referral to Physical Therapy  3. Controlled type 2 diabetes mellitus with complication, without long-term  current use of insulin ([place]): - Hemoglobin A1c at goal at 6.2%  on 01/08/2021, goal < 7%. This is slightly improved from previous hemoglobin A1c 6.4% on 02/17/2019. Next hemoglobin A1c due October 2022.  - Continue Metformin as prescribed.  - Discussed the importance of healthy eating habits, low-carbohydrate diet, low-sugar diet, regular aerobic exercise (at least 150 minutes a week as tolerated) and medication compliance to achieve or maintain control of diabetes. - Follow-up with primary provider in 3 months or sooner if needed.  - metFORMIN (GLUCOPHAGE) 500 MG tablet; Take 1 tablet (500 mg total) by mouth daily with breakfast.  Dispense: 90 tablet; Refill: 0  4. PAD (peripheral artery disease) ([place]): - Continue Aspirin and Atorvastatin as prescribed.  - Keep appointment scheduled 02/05/2021 at [facility] [place].   5. Microcytic anemia: - Continue Ferrous Sulfate as prescribed.  - CBC to recheck for anemia improvement.  - CBC - ferrous sulfate 325 (65 FE) MG tablet; Take 1 tablet (325 mg total) by mouth daily with breakfast.  Dispense: 120 tablet; Refill: 0  6. Hyponatremia: - Hyponatremia resolved prior to hospital discharge.  - Repeat CMP to check electrolytes.  - Comprehensive metabolic panel  7. Polysubstance abuse [facility]): - Patient denies current use of cocaine and drinking alcohol. Smoking 1 cigarette daily.   8. Neuropathy, alcoholic ([place]): - Continue Gabapentin as prescribed.  - Follow-up with primary provider as scheduled. - gabapentin (NEURONTIN) 300 MG capsule; Take 1 capsule (300 mg total) by mouth 2 (two) times daily.  Dispense: 180 capsule; Refill: 0  9. Gastroesophageal reflux disease, unspecified whether esophagitis present: - Continue Pantoprazole as prescribed.  - Follow-up with primary provider as scheduled.  - pantoprazole (PROTONIX) 40 MG tablet; Take 1 tablet (40 mg total) by mouth daily.  Dispense: 90 tablet; Refill: 0  10. Housing  instability: - Referral to social workers [place], Neurosurgeon for [employer].    PATIENT EDUCATION PROVIDED: See AVS   FOLLOW-UP (Include any further testing or referrals): Podiatry/Wound Care on 02/04/2021 and [facility] [place] on 02/05/2021. Follow-up with primary provider as scheduled.

## 2021-01-28 ENCOUNTER — Encounter: Payer: Self-pay | Admitting: Family

## 2021-01-28 ENCOUNTER — Ambulatory Visit (INDEPENDENT_AMBULATORY_CARE_PROVIDER_SITE_OTHER): Payer: Self-pay | Admitting: Family

## 2021-01-28 ENCOUNTER — Other Ambulatory Visit: Payer: Self-pay

## 2021-01-28 VITALS — BP 134/83 | HR 82 | Temp 97.9°F | Resp 15 | Ht 66.54 in | Wt 182.0 lb

## 2021-01-28 DIAGNOSIS — I739 Peripheral vascular disease, unspecified: Secondary | ICD-10-CM

## 2021-01-28 DIAGNOSIS — G621 Alcoholic polyneuropathy: Secondary | ICD-10-CM

## 2021-01-28 DIAGNOSIS — L97429 Non-pressure chronic ulcer of left heel and midfoot with unspecified severity: Secondary | ICD-10-CM

## 2021-01-28 DIAGNOSIS — E118 Type 2 diabetes mellitus with unspecified complications: Secondary | ICD-10-CM

## 2021-01-28 DIAGNOSIS — F1910 Other psychoactive substance abuse, uncomplicated: Secondary | ICD-10-CM

## 2021-01-28 DIAGNOSIS — E871 Hypo-osmolality and hyponatremia: Secondary | ICD-10-CM

## 2021-01-28 DIAGNOSIS — K219 Gastro-esophageal reflux disease without esophagitis: Secondary | ICD-10-CM

## 2021-01-28 DIAGNOSIS — Z09 Encounter for follow-up examination after completed treatment for conditions other than malignant neoplasm: Secondary | ICD-10-CM

## 2021-01-28 DIAGNOSIS — Z599 Problem related to housing and economic circumstances, unspecified: Secondary | ICD-10-CM

## 2021-01-28 DIAGNOSIS — D509 Iron deficiency anemia, unspecified: Secondary | ICD-10-CM

## 2021-01-28 DIAGNOSIS — E11621 Type 2 diabetes mellitus with foot ulcer: Secondary | ICD-10-CM

## 2021-01-28 DIAGNOSIS — Z59819 Housing instability, housed unspecified: Secondary | ICD-10-CM

## 2021-01-28 NOTE — Progress Notes (Signed)
Pt presents for hospitalization follow-up, states that he is still experiencing some edema and pain

## 2021-01-28 NOTE — Patient Instructions (Signed)

## 2021-01-29 ENCOUNTER — Institutional Professional Consult (permissible substitution): Payer: Self-pay | Admitting: Clinical

## 2021-01-29 ENCOUNTER — Other Ambulatory Visit: Payer: Self-pay

## 2021-01-29 ENCOUNTER — Ambulatory Visit (INDEPENDENT_AMBULATORY_CARE_PROVIDER_SITE_OTHER): Payer: Self-pay | Admitting: Clinical

## 2021-01-29 DIAGNOSIS — F419 Anxiety disorder, unspecified: Secondary | ICD-10-CM

## 2021-01-29 DIAGNOSIS — F439 Reaction to severe stress, unspecified: Secondary | ICD-10-CM

## 2021-01-29 DIAGNOSIS — F1420 Cocaine dependence, uncomplicated: Secondary | ICD-10-CM

## 2021-01-29 DIAGNOSIS — F1020 Alcohol dependence, uncomplicated: Secondary | ICD-10-CM

## 2021-01-29 DIAGNOSIS — Z5900 Homelessness unspecified: Secondary | ICD-10-CM

## 2021-01-29 LAB — COMPREHENSIVE METABOLIC PANEL
ALT: 29 IU/L (ref 0–44)
AST: 20 IU/L (ref 0–40)
Albumin/Globulin Ratio: 1.5 (ref 1.2–2.2)
Albumin: 4.1 g/dL (ref 3.8–4.9)
Alkaline Phosphatase: 69 IU/L (ref 44–121)
BUN/Creatinine Ratio: 13 (ref 9–20)
BUN: 8 mg/dL (ref 6–24)
Bilirubin Total: <0.2 mg/dL (ref 0.0–1.2)
CO2: 23 mmol/L (ref 20–29)
Calcium: 9.2 mg/dL (ref 8.7–10.2)
Chloride: 103 mmol/L (ref 96–106)
Creatinine, Ser: 0.61 mg/dL — ABNORMAL LOW (ref 0.76–1.27)
Globulin, Total: 2.8 g/dL (ref 1.5–4.5)
Glucose: 102 mg/dL — ABNORMAL HIGH (ref 65–99)
Potassium: 4.2 mmol/L (ref 3.5–5.2)
Sodium: 139 mmol/L (ref 134–144)
Total Protein: 6.9 g/dL (ref 6.0–8.5)
eGFR: 114 mL/min/{1.73_m2} (ref >59)

## 2021-01-29 LAB — CBC
Hematocrit: 30.9 % — ABNORMAL LOW (ref 37.5–51.0)
Hemoglobin: 9.6 g/dL — ABNORMAL LOW (ref 13.0–17.7)
MCH: 21.7 pg — ABNORMAL LOW (ref 26.6–33.0)
MCHC: 31.1 g/dL — ABNORMAL LOW (ref 31.5–35.7)
MCV: 70 fL — ABNORMAL LOW (ref 79–97)
Platelets: 507 10*3/uL — ABNORMAL HIGH (ref 150–450)
RBC: 4.43 x10E6/uL (ref 4.14–5.80)
RDW: 27.3 % — ABNORMAL HIGH (ref 11.6–15.4)
WBC: 4.5 10*3/uL (ref 3.4–10.8)

## 2021-01-29 NOTE — Progress Notes (Signed)
Please call patient with update.   Kidney function normal.   Liver function normal.   Anemia improved slightly since previous labs. Continue oral iron supplement and recheck anemia at lab only visit in 4 to 6 weeks.  Platelets are still higher than normal but improved since previous lab. Platelets check for blood clotting. Monitor for any abnormal bruising and bleeding. Encouraged to have rechecked in 4 to 6 weeks or sooner if needed.

## 2021-01-30 ENCOUNTER — Other Ambulatory Visit: Payer: Self-pay

## 2021-01-30 MED ORDER — FERROUS SULFATE 325 (65 FE) MG PO TABS
325.0000 mg | ORAL_TABLET | Freq: Every day | ORAL | 0 refills | Status: DC
  Filled 2021-01-30 – 2021-05-21 (×3): qty 30, 30d supply, fill #0

## 2021-01-30 MED ORDER — GABAPENTIN 300 MG PO CAPS
300.0000 mg | ORAL_CAPSULE | Freq: Two times a day (BID) | ORAL | 0 refills | Status: DC
  Filled 2021-01-30: qty 180, 90d supply, fill #0
  Filled 2021-05-21: qty 60, 30d supply, fill #0

## 2021-01-30 MED ORDER — PANTOPRAZOLE SODIUM 40 MG PO TBEC
40.0000 mg | DELAYED_RELEASE_TABLET | Freq: Every day | ORAL | 0 refills | Status: DC
  Filled 2021-01-30 – 2021-04-08 (×2): qty 90, 90d supply, fill #0
  Filled 2021-05-21: qty 30, 30d supply, fill #0
  Filled 2021-06-18: qty 30, 30d supply, fill #1

## 2021-01-30 MED ORDER — METFORMIN HCL 500 MG PO TABS
500.0000 mg | ORAL_TABLET | Freq: Every day | ORAL | 0 refills | Status: DC
  Filled 2021-01-30 – 2021-04-24 (×2): qty 90, 90d supply, fill #0

## 2021-01-30 NOTE — BH Specialist Note (Addendum)
Integrated Behavioral Health Initial In-Person Visit  MRN: [id number] Name: Tony Hicks  Number of Integrated Behavioral Health Clinician visits:: 1/6 Session Start time: 10:05 AM  Session End time: 11:05 AM Total time: 60 minutes  Types of Service: Individual psychotherapy  Interpretor:No. Interpretor Name and Language: N/A   Warm Hand Off Completed.         Subjective: Tony Hicks is a 55 y.o. male accompanied by  self Patient was referred by PCP Zonia Kief for substance use. Patient reports the following symptoms/concerns: Pt reports concerns with cocaine and alcohol use. Reports stress and excessive worrying about housing instability, finances, and physical health.  Duration of problem: 20+ years; Severity of problem: moderate  Objective: Mood: Anxious and Affect: Appropriate Risk of harm to self or others: No plan to harm self or others  Life Context: Family and Social: Pt reports that he has 7 children, some of them are adults. Reports that he is currently staying with his mother who is living in senior living.  School/Work: Pt is currently not working but reports that he has employment but is unable to work due to physical health. Self-Care: Pt reports using cocaine and alcohol for over 20 years. Reports that he has not used cocaine in two weeks and is trying to stop. Reports limited healthy coping skills.  Life Changes: Pt has experienced physical health changes due to his feet and diabetes which has caused him to not work and afford housing.   Patient and/or Family's Strengths/Protective Factors: Social connections, Social and Emotional competence, and Sense of purpose  Goals Addressed: Patient will: Reduce symptoms of: anxiety, stress, and substance use Increase knowledge and/or ability of: coping skills, healthy habits, self-management skills, and stress reduction  Demonstrate ability to: Increase healthy adjustment to current life circumstances, Increase adequate  support systems for patient/family, and Decrease self-medicating behaviors  Progress towards Goals: Ongoing  Interventions: Interventions utilized: Motivational Interviewing, Mindfulness or Management consultant, CBT Cognitive Behavioral Therapy, Supportive Counseling, Psychoeducation and/or Health Education, and Preventative Services/Health Promotion  Standardized Assessments completed:  DAST 10, MDQ, AUDIT, GAD-7, and PHQ 9  Patient and/or Family Response: Pt receptive to tx.  Patient Centered Plan: Patient is on the following Treatment Plan(s):  Pt will fu with LCSW. Pt will consider inpatient tx.  Assessment: MDQ was negative and DAST score was 4. Pt presents with anxious mood with an appropriate affect. Pt had difficulty remaining still during session. Denies SI/HI. Denies auditory/visual hallucinations. No safety risks. Reports that he has been abusing cocaine and alcohol for over 20 years. Reports that he has been involved in inpatient tx in the past and relapsed following discharge. Pt reports he has not used cocaine in two weeks and drinks 1 beer/day. Pt reports that he spent $300-$400/week on cocaine but has been unable to due to being out of work. Pt is experiencing health challenges with his feet and diabetes. He is worried about needing his foot amputated. Reports that he also is having to live with his mother in her senior living space due to no longer being able to afford housing. Patient currently experiencing difficulty with stopping substance use.    Patient may benefit from inpatient tx as he is willing to try again. Pt also provided with housing resources. Pt is currently waiting on his medicaid and is uninsured. Pt may temporarily need SAIOP if not accepted into inpatient tx. LCSW will fu with pt and will discuss tx plan with Care Coordinator.  Plan: Follow up with behavioral health  clinician on : 02/12/21 Behavioral recommendations: Utilize deep breathing exercises, establish  boundaries in unhealthy relationships, identify healthy coping skills Referral(s): Integrated Art gallery manager (In Clinic), Substance Abuse Program, and Community Resources:  Housing "From scale of 1-10, how likely are you to follow plan?": 10  Rochelle Nephew C Hester Forget, LCSW

## 2021-02-01 ENCOUNTER — Telehealth: Payer: Self-pay | Admitting: Family

## 2021-02-01 NOTE — Telephone Encounter (Signed)
Pateint stated he has misplaced the number he was given so he can get in touch with Amy and the paperwork for [facility] or [place]. Patient is wanting to find out what transportation information he was given. Patient is asking for a call back.

## 2021-02-04 ENCOUNTER — Other Ambulatory Visit: Payer: Self-pay

## 2021-02-04 ENCOUNTER — Encounter (HOSPITAL_BASED_OUTPATIENT_CLINIC_OR_DEPARTMENT_OTHER): Payer: 59 | Attending: Internal Medicine | Admitting: Internal Medicine

## 2021-02-04 DIAGNOSIS — S81802A Unspecified open wound, left lower leg, initial encounter: Secondary | ICD-10-CM

## 2021-02-04 DIAGNOSIS — Z7984 Long term (current) use of oral hypoglycemic drugs: Secondary | ICD-10-CM | POA: Insufficient documentation

## 2021-02-04 DIAGNOSIS — S91302A Unspecified open wound, left foot, initial encounter: Secondary | ICD-10-CM

## 2021-02-04 DIAGNOSIS — F17210 Nicotine dependence, cigarettes, uncomplicated: Secondary | ICD-10-CM | POA: Insufficient documentation

## 2021-02-04 DIAGNOSIS — E11621 Type 2 diabetes mellitus with foot ulcer: Secondary | ICD-10-CM | POA: Diagnosis not present

## 2021-02-04 DIAGNOSIS — L97422 Non-pressure chronic ulcer of left heel and midfoot with fat layer exposed: Secondary | ICD-10-CM | POA: Insufficient documentation

## 2021-02-04 DIAGNOSIS — I739 Peripheral vascular disease, unspecified: Secondary | ICD-10-CM

## 2021-02-04 DIAGNOSIS — W2203XA Walked into furniture, initial encounter: Secondary | ICD-10-CM | POA: Insufficient documentation

## 2021-02-04 DIAGNOSIS — E1151 Type 2 diabetes mellitus with diabetic peripheral angiopathy without gangrene: Secondary | ICD-10-CM | POA: Insufficient documentation

## 2021-02-04 DIAGNOSIS — E1140 Type 2 diabetes mellitus with diabetic neuropathy, unspecified: Secondary | ICD-10-CM | POA: Insufficient documentation

## 2021-02-04 NOTE — Progress Notes (Signed)
[place], REAS ([id number]) Visit Report for 02/04/2021 Abuse/Suicide Risk Screen Details Patient Name: Date of Service: Tony Hicks, Tony Hicks 02/04/2021 9:00 A M Medical Record Number: [id number] Patient Account Number: [id number] Date of Birth/Sex: Treating RN: December 06, 1965 (55 y.o. Tony Hicks Primary Care Star Resler: Ricky Stabs Other Clinician: Referring Kodie Kishi: Treating Tylah Mancillas/Extender: Bonna Gains, Amy Weeks in Treatment: 0 Abuse/Suicide Risk Screen Items Answer ABUSE RISK SCREEN: Has anyone close to you tried to hurt or harm you recentlyo No Do you feel uncomfortable with anyone in your familyo No Has anyone forced you do things that you didnt want to doo No Electronic Signature(s) Signed: 02/04/2021 5:51:46 PM By: Zandra Abts RN, BSN Entered By: Zandra Abts on 02/04/2021 09:29:55 -------------------------------------------------------------------------------- Activities of Daily Living Details Patient Name: Date of Service: Tony Hicks, Tony Hicks 02/04/2021 9:00 A M Medical Record Number: [id number] Patient Account Number: [id number] Date of Birth/Sex: Treating RN: 04-23-1966 (55 y.o. Tony Hicks Primary Care Choya Tornow: Ricky Stabs Other Clinician: Referring Atilano Covelli: Treating Leyland Kenna/Extender: Bonna Gains, Amy Weeks in Treatment: 0 Activities of Daily Living Items Answer Activities of Daily Living (Please select one for each item) Drive Automobile Completely Able T Medications ake Completely Able Use T elephone Completely Able Care for Appearance Completely Able Use T oilet Completely Able Bath / Shower Completely Able Dress Self Completely Able Feed Self Completely Able Walk Completely Able Get In / Out Bed Completely Able Housework Completely Able Prepare Meals Completely Able Handle Money Completely Able Shop for Self Completely Able Electronic Signature(s) Signed: 02/04/2021 5:51:46 PM By: Zandra Abts RN, BSN Entered By:  Zandra Abts on 02/04/2021 09:30:53 -------------------------------------------------------------------------------- Education Screening Details Patient Name: Date of Service: Tony Hicks, Tony Hicks 02/04/2021 9:00 A M Medical Record Number: [id number] Patient Account Number: [id number] Date of Birth/Sex: Treating RN: May 24, 1966 (55 y.o. Tony Hicks Primary Care Quantel Mcinturff: Ricky Stabs Other Clinician: Referring Rosena Bartle: Treating Johnavon Mcclafferty/Extender: Bonna Gains, Amy Weeks in Treatment: 0 Primary Learner Assessed: Patient Learning Preferences/Education Level/Primary Language Learning Preference: Explanation, Demonstration, Printed Material Highest Education Level: High School Preferred Language: English Cognitive Barrier Language Barrier: No Translator Needed: No Memory Deficit: No Emotional Barrier: No Cultural/Religious Beliefs Affecting Medical Care: No Physical Barrier Impaired Vision: No Impaired Hearing: No Decreased Hand dexterity: No Knowledge/Comprehension Knowledge Level: High Comprehension Level: High Ability to understand written instructions: High Ability to understand verbal instructions: High Motivation Anxiety Level: Calm Cooperation: Cooperative Education Importance: Acknowledges Need Interest in Health Problems: Asks Questions Perception: Coherent Willingness to Engage in Self-Management High Activities: Readiness to Engage in Self-Management High Activities: Electronic Signature(s) Signed: 02/04/2021 5:51:46 PM By: Zandra Abts RN, BSN Entered By: Zandra Abts on 02/04/2021 09:31:12 -------------------------------------------------------------------------------- Fall Risk Assessment Details Patient Name: Date of Service: Tony Hicks, Tony Hicks 02/04/2021 9:00 A M Medical Record Number: [id number] Patient Account Number: [id number] Date of Birth/Sex: Treating RN: Jan 31, 1966 (55 y.o. Tony Hicks Primary Care Liylah Najarro: Ricky Stabs Other Clinician: Referring Asuncion Shibata: Treating Suzanne Kho/Extender: Bonna Gains, Amy Weeks in Treatment: 0 Fall Risk Assessment Items Have you had 2 or more falls in the last 12 monthso 0 No Have you had any fall that resulted in injury in the last 12 monthso 0 No FALLS RISK SCREEN History of falling - immediate or within 3 months 0 No Secondary diagnosis (Do you have 2 or more medical diagnoseso) 15 Yes Ambulatory aid None/bed rest/wheelchair/nurse 0 Yes Crutches/cane/walker 0 No Furniture 0 No Intravenous therapy Access/Saline/Heparin Lock 0 No Gait/Transferring Normal/ bed rest/  wheelchair 0 Yes Weak (short steps with or without shuffle, stooped but able to lift head while walking, may seek 0 No support from furniture) Impaired (short steps with shuffle, may have difficulty arising from chair, head down, impaired 0 No balance) Mental Status Oriented to own ability 0 Yes Electronic Signature(s) Signed: 02/04/2021 5:51:46 PM By: Zandra Abts RN, BSN Entered By: Zandra Abts on 02/04/2021 09:31:24 -------------------------------------------------------------------------------- Foot Assessment Details Patient Name: Date of Service: Tony Hicks, Tony Hicks 02/04/2021 9:00 A M Medical Record Number: [id number] Patient Account Number: [id number] Date of Birth/Sex: Treating RN: Feb 10, 1966 (55 y.o. Tony Hicks Primary Care Evanny Ellerbe: Ricky Stabs Other Clinician: Referring Jermon Chalfant: Treating Quran Vasco/Extender: Bonna Gains, Amy Weeks in Treatment: 0 Foot Assessment Items Site Locations + = Sensation present, - = Sensation absent, C = Callus, U = Ulcer R = Redness, W = Warmth, M = Maceration, PU = Pre-ulcerative lesion F = Fissure, S = Swelling, D = Dryness Assessment Right: Left: Other Deformity: No No Prior Foot Ulcer: No No Prior Amputation: No No Charcot Joint: No No Ambulatory Status: Ambulatory Without Help Gait: Steady Electronic  Signature(s) Signed: 02/04/2021 5:51:46 PM By: Zandra Abts RN, BSN Entered By: Zandra Abts on 02/04/2021 09:33:07 -------------------------------------------------------------------------------- Nutrition Risk Screening Details Patient Name: Date of Service: Tony Hicks, Tony Hicks 02/04/2021 9:00 A M Medical Record Number: [id number] Patient Account Number: [id number] Date of Birth/Sex: Treating RN: 1966-01-30 (55 y.o. Tony Hicks Primary Care Shandee Jergens: Ricky Stabs Other Clinician: Referring Carlota Philley: Treating Rudy Luhmann/Extender: Bonna Gains, Amy Weeks in Treatment: 0 Height (in): 67 Weight (lbs): 190 Body Mass Index (BMI): 29.8 Nutrition Risk Screening Items Score Screening NUTRITION RISK SCREEN: I have an illness or condition that made me change the kind and/or amount of food I eat 0 No I eat fewer than two meals per day 0 No I eat few fruits and vegetables, or milk products 0 No I have three or more drinks of beer, liquor or wine almost every day 0 No I have tooth or mouth problems that make it hard for me to eat 0 No I don't always have enough money to buy the food I need 0 No I eat alone most of the time 0 No I take three or more different prescribed or over-the-counter drugs a day 1 Yes Without wanting to, I have lost or gained 10 pounds in the last six months 0 No I am not always physically able to shop, cook and/or feed myself 0 No Nutrition Protocols Good Risk Protocol Moderate Risk Protocol 0 Provide education on nutrition High Risk Proctocol Risk Level: Good Risk Score: 1 Electronic Signature(s) Signed: 02/04/2021 5:51:46 PM By: Zandra Abts RN, BSN Entered By: Zandra Abts on 02/04/2021 09:31:36

## 2021-02-04 NOTE — Progress Notes (Addendum)
Tony Hicks ([id number]) Visit Report for 02/04/2021 Chief Complaint Document Details Patient Name: Date of Service: Tony Hicks 02/04/2021 9:00 A M Medical Record Number: [id number] Patient Account Number: [id number] Date of Birth/Sex: Treating RN: December 12, 1965 (55 y.o. Male) Zandra Abts Primary Care Provider: Ricky Stabs Other Clinician: Referring Provider: Treating Provider/Extender: Bonna Gains, Amy Weeks in Treatment: 0 Information Obtained from: Patient Chief Complaint Left lower extremity wounds Electronic Signature(s) Signed: 02/04/2021 10:53:43 AM By: Geralyn Corwin DO Entered By: Geralyn Corwin on 02/04/2021 10:19:24 -------------------------------------------------------------------------------- Debridement Details Patient Name: Date of Service: Tony Hicks 02/04/2021 9:00 A M Medical Record Number: [id number] Patient Account Number: [id number] Date of Birth/Sex: Treating RN: [birth year] (55 y.o. Male) Zandra Abts Primary Care Provider: Ricky Stabs Other Clinician: Referring Provider: Treating Provider/Extender: Bonna Gains, Amy Weeks in Treatment: 0 Debridement Performed for Assessment: Wound #3 Left Calcaneus Performed By: Physician Geralyn Corwin, DO Debridement Type: Debridement Severity of Tissue Pre Debridement: Fat layer exposed Level of Consciousness (Pre-procedure): Awake and Alert Pre-procedure Verification/Time Out Yes - 09:58 Taken: Start Time: 09:58 T Area Debrided (L x W): otal 2 (cm) x 3 (cm) = 6 (cm) Tissue and other material debrided: Non-Viable, Eschar, Slough, Slough Level: Non-Viable Tissue Debridement Description: Selective/Open Wound Instrument: Blade, Forceps Bleeding: None End Time: 10:00 Procedural Pain: 0 Post Procedural Pain: 0 Response to Treatment: Procedure was tolerated well Level of Consciousness (Post- Awake and Alert procedure): Post Debridement Measurements of Total  Wound Length: (cm) 5.1 Width: (cm) 4.8 Depth: (cm) 0.3 Volume: (cm) 5.768 Character of Wound/Ulcer Post Debridement: Requires Further Debridement Severity of Tissue Post Debridement: Fat layer exposed Post Procedure Diagnosis Same as Pre-procedure Electronic Signature(s) Signed: 02/04/2021 10:53:43 AM By: Geralyn Corwin DO Signed: 02/04/2021 5:51:46 PM By: Zandra Abts RN, BSN Entered By: Zandra Abts on 02/04/2021 10:01:03 -------------------------------------------------------------------------------- Debridement Details Patient Name: Date of Service: Tony Hicks 02/04/2021 9:00 A M Medical Record Number: [id number] Patient Account Number: [id number] Date of Birth/Sex: Treating RN: 29-Aug-1965 (55 y.o. Male) Zandra Abts Primary Care Provider: Ricky Stabs Other Clinician: Referring Provider: Treating Provider/Extender: Bonna Gains, Amy Weeks in Treatment: 0 Debridement Performed for Assessment: Wound #1 Left,Proximal,Lateral Lower Leg Performed By: Clinician Zandra Abts, RN Debridement Type: Chemical/Enzymatic/Mechanical Agent Used: Santyl Severity of Tissue Pre Debridement: Fat layer exposed Level of Consciousness (Pre-procedure): Awake and Alert Pre-procedure Verification/Time Out Yes - 10:00 Taken: Start Time: 10:00 Bleeding: None End Time: 10:00 Procedural Pain: 0 Post Procedural Pain: 0 Response to Treatment: Procedure was tolerated well Level of Consciousness (Post- Awake and Alert procedure): Post Debridement Measurements of Total Wound Length: (cm) 3.6 Width: (cm) 0.4 Depth: (cm) 0.1 Volume: (cm) 0.113 Character of Wound/Ulcer Post Debridement: Requires Further Debridement Severity of Tissue Post Debridement: Fat layer exposed Post Procedure Diagnosis Same as Pre-procedure Electronic Signature(s) Signed: 02/04/2021 10:53:43 AM By: Geralyn Corwin DO Signed: 02/04/2021 5:51:46 PM By: Zandra Abts RN, BSN Entered By: Zandra Abts on 02/04/2021 10:01:53 -------------------------------------------------------------------------------- Debridement Details Patient Name: Date of Service: Tony Hicks 02/04/2021 9:00 A M Medical Record Number: [id number] Patient Account Number: [id number] Date of Birth/Sex: Treating RN: 10/22/1965 (55 y.o. Male) Zandra Abts Primary Care Provider: Ricky Stabs Other Clinician: Referring Provider: Treating Provider/Extender: Bonna Gains, Amy Weeks in Treatment: 0 Debridement Performed for Assessment: Wound #2 Left,Lateral Lower Leg Performed By: Clinician Zandra Abts, RN Debridement Type: Chemical/Enzymatic/Mechanical Agent Used: Santyl Severity of Tissue Pre Debridement: Fat layer exposed Level of Consciousness (Pre-procedure): Awake and  Alert Pre-procedure Verification/Time Out Yes - 10:00 Taken: Start Time: 10:00 Bleeding: None End Time: 10:00 Procedural Pain: 0 Post Procedural Pain: 0 Response to Treatment: Procedure was tolerated well Level of Consciousness (Post- Awake and Alert procedure): Post Debridement Measurements of Total Wound Length: (cm) 4.7 Width: (cm) 3.5 Depth: (cm) 0.1 Volume: (cm) 1.292 Character of Wound/Ulcer Post Debridement: Requires Further Debridement Severity of Tissue Post Debridement: Fat layer exposed Post Procedure Diagnosis Same as Pre-procedure Electronic Signature(s) Signed: 02/04/2021 10:53:43 AM By: Geralyn Corwin DO Signed: 02/04/2021 5:51:46 PM By: Zandra Abts RN, BSN Entered By: Zandra Abts on 02/04/2021 10:02:22 -------------------------------------------------------------------------------- HPI Details Patient Name: Date of Service: Tony Hicks 02/04/2021 9:00 A M Medical Record Number: [id number] Patient Account Number: [id number] Date of Birth/Sex: Treating RN: [birth year] (55 y.o. Male) Zandra Abts Primary Care Provider: Ricky Stabs Other Clinician: Referring Provider: Treating  Provider/Extender: Bonna Gains, Amy Weeks in Treatment: 0 History of Present Illness HPI Description: Admission 8/1 [name] is a 55 year old male with a past medical history of controlled type 2 diabetes on oral agents, polysubstance abuse (tobacco, alcohol, cocaine) and peripheral arterial disease that presents with 3 left lower extremity wounds. He states that the wound to his heel started out as dried cracking that eventually became infected and worsened. He was following podiatry for this issue. This wound has been ongoing for about 3 months. There are 2 other wounds to his lateral left leg that started out as an injury from hitting his bed post. He states this happened several weeks ago. He reports mild tenderness to the wound beds. He has been using Santyl prescribed to him on discharge from hospital admission. He has no issues or complaints today. He denies signs of infection. Electronic Signature(s) Signed: 02/04/2021 10:53:43 AM By: Geralyn Corwin DO Entered By: Geralyn Corwin on 02/04/2021 10:47:15 -------------------------------------------------------------------------------- Physical Exam Details Patient Name: Date of Service: Tony Hicks 02/04/2021 9:00 A M Medical Record Number: [id number] Patient Account Number: [id number] Date of Birth/Sex: Treating RN: 1966-05-24 (55 y.o. Male) Zandra Abts Primary Care Provider: Ricky Stabs Other Clinician: Referring Provider: Treating Provider/Extender: Bonna Gains, Amy Weeks in Treatment: 0 Constitutional respirations regular, non-labored and within target range for patient.Marland Kitchen Psychiatric pleasant and cooperative. Notes Left lower extremity: 3 wounds. There are 2 open wounds to his lateral side. The most proximal is small with granulation tissue present. The most distal wound is circular with eschar. T his left heel there is a large open wound with slough and granulation tissue present.  Difficult to palpate pedal pulses o Electronic Signature(s) Signed: 02/04/2021 10:53:43 AM By: Geralyn Corwin DO Entered By: Geralyn Corwin on 02/04/2021 10:48:15 -------------------------------------------------------------------------------- Physician Orders Details Patient Name: Date of Service: Tony Hicks 02/04/2021 9:00 A M Medical Record Number: [id number] Patient Account Number: [id number] Date of Birth/Sex: Treating RN: 1965/08/06 (55 y.o. Male) Zandra Abts Primary Care Provider: Ricky Stabs Other Clinician: Referring Provider: Treating Provider/Extender: Bonna Gains, Amy Weeks in Treatment: 0 Verbal / Phone Orders: No Diagnosis Coding ICD-10 Coding Code Description S91.302A Unspecified open wound, left foot, initial encounter S81.802A Unspecified open wound, left lower leg, initial encounter I73.9 Peripheral vascular disease, unspecified E11.621 Type 2 diabetes mellitus with foot ulcer Follow-up Appointments ppointment in 2 weeks. - with Dr. Mikey Bussing Return A Bathing/ Shower/ Hygiene May shower and wash wound with soap and water. - prior to dressing change Edema Control - Lymphedema / SCD / Other Elevate legs to the level of  the heart or above for 30 minutes daily and/or when sitting, a frequency of: - throughout the day Avoid standing for long periods of time. Wound Treatment Wound #1 - Lower Leg Wound Laterality: Left, Lateral, Proximal Cleanser: Soap and Water 1 x Per Day/15 Days Discharge Instructions: May shower and wash wound with dial antibacterial soap and water prior to dressing change. Prim Dressing: Santyl Ointment 1 x Per Day/15 Days ary Discharge Instructions: Apply nickel thick amount to wound bed as instructed Secondary Dressing: Woven Gauze Sponge, Non-Sterile 4x4 in 1 x Per Day/15 Days Discharge Instructions: Apply over primary dressing as directed. Secured With: [employer], 4.5x3.1 (in/yd) 1 x Per Day/15  Days Discharge Instructions: Secure with Kerlix as directed. Secured With: 50M Medipore H Soft Cloth Surgical Tape, 2x2 (in/yd) 1 x Per Day/15 Days Discharge Instructions: Secure dressing with tape as directed. Wound #2 - Lower Leg Wound Laterality: Left, Lateral Cleanser: Soap and Water 1 x Per Day/15 Days Discharge Instructions: May shower and wash wound with dial antibacterial soap and water prior to dressing change. Prim Dressing: Santyl Ointment 1 x Per Day/15 Days ary Discharge Instructions: Apply nickel thick amount to wound bed as instructed Secondary Dressing: Woven Gauze Sponge, Non-Sterile 4x4 in 1 x Per Day/15 Days Discharge Instructions: Apply over primary dressing as directed. Secured With: [employer], 4.5x3.1 (in/yd) 1 x Per Day/15 Days Discharge Instructions: Secure with Kerlix as directed. Secured With: 50M Medipore H Soft Cloth Surgical Tape, 2x2 (in/yd) 1 x Per Day/15 Days Discharge Instructions: Secure dressing with tape as directed. Wound #3 - Calcaneus Wound Laterality: Left Cleanser: Soap and Water 1 x Per Day/15 Days Discharge Instructions: May shower and wash wound with dial antibacterial soap and water prior to dressing change. Prim Dressing: Santyl Ointment 1 x Per Day/15 Days ary Discharge Instructions: Apply nickel thick amount to wound bed as instructed Secondary Dressing: Woven Gauze Sponge, Non-Sterile 4x4 in 1 x Per Day/15 Days Discharge Instructions: Apply over primary dressing as directed. Secured With: [employer], 4.5x3.1 (in/yd) 1 x Per Day/15 Days Discharge Instructions: Secure with Kerlix as directed. Secured With: 50M Medipore H Soft Cloth Surgical Tape, 2x2 (in/yd) 1 x Per Day/15 Days Discharge Instructions: Secure dressing with tape as directed. Electronic Signature(s) Signed: 02/04/2021 10:53:43 AM By: Geralyn Corwin DO Entered By: Geralyn Corwin on 02/04/2021  10:52:37 -------------------------------------------------------------------------------- Problem List Details Patient Name: Date of Service: Tony Hicks 02/04/2021 9:00 A M Medical Record Number: [id number] Patient Account Number: [id number] Date of Birth/Sex: Treating RN: 17-Sep-1965 (55 y.o. Male) Zandra Abts Primary Care Provider: Ricky Stabs Other Clinician: Referring Provider: Treating Provider/Extender: Bonna Gains, Amy Weeks in Treatment: 0 Active Problems ICD-10 Encounter Code Description Active Date MDM Diagnosis S91.302A Unspecified open wound, left foot, initial encounter 02/04/2021 No Yes S81.802A Unspecified open wound, left lower leg, initial encounter 02/04/2021 No Yes I73.9 Peripheral vascular disease, unspecified 02/04/2021 No Yes E11.621 Type 2 diabetes mellitus with foot ulcer 02/04/2021 No Yes Inactive Problems Resolved Problems Electronic Signature(s) Signed: 02/04/2021 10:53:43 AM By: Geralyn Corwin DO Entered By: Geralyn Corwin on 02/04/2021 10:51:57 -------------------------------------------------------------------------------- Progress Note Details Patient Name: Date of Service: Tony Hicks 02/04/2021 9:00 A M Medical Record Number: [id number] Patient Account Number: [id number] Date of Birth/Sex: Treating RN: February 23, 1966 (55 y.o. Male) Zandra Abts Primary Care Provider: Ricky Stabs Other Clinician: Referring Provider: Treating Provider/Extender: Bonna Gains, Amy Weeks in Treatment: 0 Subjective Chief Complaint Information obtained from Patient Left lower extremity  wounds History of Present Illness (HPI) Admission 8/1 [name] is a 55 year old male with a past medical history of controlled type 2 diabetes on oral agents, polysubstance abuse (tobacco, alcohol, cocaine) and peripheral arterial disease that presents with 3 left lower extremity wounds. He states that the wound to his heel started out as  dried cracking that eventually became infected and worsened. He was following podiatry for this issue. This wound has been ongoing for about 3 months. There are 2 other wounds to his lateral left leg that started out as an injury from hitting his bed post. He states this happened several weeks ago. He reports mild tenderness to the wound beds. He has been using Santyl prescribed to him on discharge from hospital admission. He has no issues or complaints today. He denies signs of infection. Patient History Information obtained from Patient. Allergies No Known Drug Allergies Family History Unknown History. Social History Current every day smoker - 1 cigarette per day, Marital Status - Single, Alcohol Use - Moderate - Beer, Drug Use - Prior History - Cocaine, Caffeine Use - Rarely. Medical History Cardiovascular Patient has history of Peripheral Arterial Disease Endocrine Patient has history of Type II Diabetes Neurologic Patient has history of Neuropathy Patient is treated with Oral Agents. Blood sugar is not tested. Review of Systems (ROS) Constitutional Symptoms (General Health) Denies complaints or symptoms of Fatigue, Fever, Chills, Marked Weight Change. Eyes Denies complaints or symptoms of Dry Eyes, Vision Changes, Glasses / Contacts. Ear/Nose/Mouth/Throat Denies complaints or symptoms of Chronic sinus problems or rhinitis. Respiratory Denies complaints or symptoms of Chronic or frequent coughs, Shortness of Breath. Gastrointestinal Denies complaints or symptoms of Frequent diarrhea, Nausea, Vomiting. Genitourinary Denies complaints or symptoms of Frequent urination. Integumentary (Skin) Complains or has symptoms of Wounds - wounds on left leg and heel. Musculoskeletal Denies complaints or symptoms of Muscle Pain, Muscle Weakness. Psychiatric Denies complaints or symptoms of Claustrophobia, Suicidal. Objective Constitutional respirations regular, non-labored and within  target range for patient.. Vitals Time Taken: 9:20 AM, Height: 67 in, Source: Stated, Weight: 190 lbs, Source: Stated, BMI: 29.8, Temperature: 98.4 F, Pulse: 82 bpm, Respiratory Rate: 16 breaths/min, Blood Pressure: 133/79 mmHg. Psychiatric pleasant and cooperative. General Notes: Left lower extremity: 3 wounds. There are 2 open wounds to his lateral side. The most proximal is small with granulation tissue present. The most distal wound is circular with eschar. T his left heel there is a large open wound with slough and granulation tissue present. Difficult to palpate pedal pulses o Integumentary (Hair, Skin) Wound #1 status is Open. Original cause of wound was Gradually Appeared. The date acquired was: 11/04/2020. The wound is located on the Left,Proximal,Lateral Lower Leg. The wound measures 3.6cm length x 0.4cm width x 0.1cm depth; 1.131cm^2 area and 0.113cm^3 volume. There is Fat Layer (Subcutaneous Tissue) exposed. There is no tunneling or undermining noted. There is a medium amount of serosanguineous drainage noted. The wound margin is flat and intact. There is medium (34-66%) red granulation within the wound bed. There is a medium (34-66%) amount of necrotic tissue within the wound bed including Adherent Slough. Wound #2 status is Open. Original cause of wound was Gradually Appeared. The date acquired was: 11/04/2020. The wound is located on the Left,Lateral Lower Leg. The wound measures 4.7cm length x 3.5cm width x 0.1cm depth; 12.92cm^2 area and 1.292cm^3 volume. There is Fat Layer (Subcutaneous Tissue) exposed. There is no tunneling or undermining noted. There is a medium amount of serosanguineous drainage noted. The  wound margin is flat and intact. There is no granulation within the wound bed. There is a large (67-100%) amount of necrotic tissue within the wound bed including Eschar and Adherent Slough. Wound #3 status is Open. Original cause of wound was Gradually Appeared. The date  acquired was: 11/04/2020. The wound is located on the Left Calcaneus. The wound measures 5.1cm length x 4.8cm width x 0.3cm depth; 19.227cm^2 area and 5.768cm^3 volume. There is Fat Layer (Subcutaneous Tissue) exposed. There is no tunneling or undermining noted. There is a medium amount of serosanguineous drainage noted. The wound margin is flat and intact. There is medium (34-66%) pink granulation within the wound bed. There is a medium (34-66%) amount of necrotic tissue within the wound bed including Eschar and Adherent Slough. Assessment Active Problems ICD-10 Unspecified open wound, left foot, initial encounter Unspecified open wound, left lower leg, initial encounter Peripheral vascular disease, unspecified Type 2 diabetes mellitus with foot ulcer Patient presents with 3 chronically nonhealing wounds to his left lower extremity that is caused by arterial insufficiency. Patient was admitted in the hospital from 7/5 to 01/19/2021 for sepsis secondary to diabetic foot ulcer with cellulitis. 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 He subsequently had a left above-knee popliteal to posterior tibial artery bypass by Dr. Chestine Spore. He was discharged with antibiotics and asked to use Santyl for wound care. He has been using Santyl daily without issues. I was able to debride some of the nonviable tissue to the left heel wound. I crosshatched the eschar on the most distal left lower extremity wound. No signs of infection on exam. I recommended he continue to use Santyl daily to all 3 wound beds. He follows up with vein and vascular tomorrow. 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 There was a Chemical/Enzymatic/Mechanical debridement performed by Zandra Abts, RN.Marland Kitchen Agent used was [employer]. A time out was conducted at 10:00, prior to the start of the procedure. There was no bleeding. The procedure was tolerated well with a pain level of 0 throughout and a pain level of 0 following the procedure. Post Debridement Measurements: 3.6cm length x 0.4cm width x 0.1cm depth; 0.113cm^3 volume. Character of Wound/Ulcer Post Debridement requires further debridement. Severity of Tissue Post Debridement is: Fat layer exposed. 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 There was a Chemical/Enzymatic/Mechanical debridement performed by Zandra Abts, RN.Marland Kitchen Agent used was [employer]. A time out was conducted at 10:00, prior to the start of the procedure. There was no bleeding. The procedure was tolerated well with a pain level of 0 throughout and a pain level of 0 following the procedure. Post Debridement Measurements: 4.7cm length x 3.5cm width x 0.1cm depth; 1.292cm^3 volume. Character of Wound/Ulcer Post Debridement requires further debridement. Severity of Tissue Post Debridement is: Fat layer exposed. Post procedure Diagnosis Wound #2: Same as Pre-Procedure Wound #3 Pre-procedure diagnosis of Wound #3 is a Diabetic Wound/Ulcer of the Lower Extremity located on the Left Calcaneus .Severity of Tissue Pre Debridement is: Fat layer exposed. There was a  Selective/Open Wound Non-Viable Tissue Debridement with a total area of 6 sq cm performed by Geralyn Corwin, DO. With the following instrument(s): Blade, and Forceps to remove Non-Viable tissue/material. Material removed includes Eschar and Slough and. No specimens were taken. A time out was conducted at 09:58, prior to the  start of the procedure. There was no bleeding. The procedure was tolerated well with a pain level of 0 throughout and a pain level of 0 following the procedure. Post Debridement Measurements: 5.1cm length x 4.8cm width x 0.3cm depth; 5.768cm^3 volume. Character of Wound/Ulcer Post Debridement requires further debridement. Severity of Tissue Post Debridement is: Fat layer exposed. Post procedure Diagnosis Wound #3: Same as Pre-Procedure Plan Follow-up Appointments: Return Appointment in 2 weeks. - with Dr. Mikey Bussing Bathing/ Shower/ Hygiene: May shower and wash wound with soap and water. - prior to dressing change Edema Control - Lymphedema / SCD / Other: Elevate legs to the level of the heart or above for 30 minutes daily and/or when sitting, a frequency of: - throughout the day Avoid standing for long periods of time. WOUND #1: - Lower Leg Wound Laterality: Left, Lateral, Proximal Cleanser: Soap and Water 1 x Per Day/15 Days Discharge Instructions: May shower and wash wound with dial antibacterial soap and water prior to dressing change. Prim Dressing: Santyl Ointment 1 x Per Day/15 Days ary Discharge Instructions: Apply nickel thick amount to wound bed as instructed Secondary Dressing: Woven Gauze Sponge, Non-Sterile 4x4 in 1 x Per Day/15 Days Discharge Instructions: Apply over primary dressing as directed. Secured With: [employer], 4.5x3.1 (in/yd) 1 x Per Day/15 Days Discharge Instructions: Secure with Kerlix as directed. Secured With: 50M Medipore H Soft Cloth Surgical T ape, 2x2 (in/yd) 1 x Per Day/15 Days Discharge Instructions: Secure dressing with tape as directed. WOUND #2: - Lower Leg Wound Laterality: Left, Lateral Cleanser: Soap and Water 1 x Per Day/15 Days Discharge Instructions: May shower and wash wound with dial antibacterial soap and water prior to dressing change. Prim Dressing: Santyl Ointment 1 x Per Day/15 Days ary Discharge Instructions: Apply nickel thick  amount to wound bed as instructed Secondary Dressing: Woven Gauze Sponge, Non-Sterile 4x4 in 1 x Per Day/15 Days Discharge Instructions: Apply over primary dressing as directed. Secured With: [employer], 4.5x3.1 (in/yd) 1 x Per Day/15 Days Discharge Instructions: Secure with Kerlix as directed. Secured With: 50M Medipore H Soft Cloth Surgical T ape, 2x2 (in/yd) 1 x Per Day/15 Days Discharge Instructions: Secure dressing with tape as directed. WOUND #3: - Calcaneus Wound Laterality: Left Cleanser: Soap and Water 1 x Per Day/15 Days Discharge Instructions: May shower and wash wound with dial antibacterial soap and water prior to dressing change. Prim Dressing: Santyl Ointment 1 x Per Day/15 Days ary Discharge Instructions: Apply nickel thick amount to wound bed as instructed Secondary Dressing: Woven Gauze Sponge, Non-Sterile 4x4 in 1 x Per Day/15 Days Discharge Instructions: Apply over primary dressing as directed. Secured With: [employer], 4.5x3.1 (in/yd) 1 x Per Day/15 Days Discharge Instructions: Secure with Kerlix as directed. Secured With: 50M Medipore H Soft Cloth Surgical T ape, 2x2 (in/yd) 1 x Per Day/15 Days Discharge Instructions: Secure dressing with tape as directed. 1. In office sharp debridement 2. Continue Santyl daily 3. Follow-up in 2 weeks Electronic Signature(s) Signed: 02/04/2021 10:53:43 AM By: Geralyn Corwin DO Entered By: Geralyn Corwin on 02/04/2021 10:52:48 -------------------------------------------------------------------------------- HxROS Details Patient Name: Date of Service: Tony Hicks 02/04/2021 9:00 A M Medical Record Number:  [id number] Patient Account Number: [id number] Date of Birth/Sex: Treating RN: [birth year] (55 y.o. Male) Zandra Abts Primary Care Provider: Ricky Stabs Other Clinician: Referring Provider: Treating Provider/Extender: Bonna Gains, Amy Weeks in Treatment: 0 Information Obtained  From Patient Constitutional Symptoms (General Health) Complaints and Symptoms: Negative for: Fatigue; Fever; Chills; Marked Weight Change Eyes Complaints and Symptoms: Negative for: Dry Eyes; Vision Changes; Glasses / Contacts Ear/Nose/Mouth/Throat Complaints and Symptoms: Negative for: Chronic sinus problems or rhinitis Respiratory Complaints and Symptoms: Negative for: Chronic or frequent coughs; Shortness of Breath Gastrointestinal Complaints and Symptoms: Negative for: Frequent diarrhea; Nausea; Vomiting Genitourinary Complaints and Symptoms: Negative for: Frequent urination Integumentary (Skin) Complaints and Symptoms: Positive for: Wounds - wounds on left leg and heel Musculoskeletal Complaints and Symptoms: Negative for: Muscle Pain; Muscle Weakness Psychiatric Complaints and Symptoms: Negative for: Claustrophobia; Suicidal Hematologic/Lymphatic Cardiovascular Medical History: Positive for: Peripheral Arterial Disease Endocrine Medical History: Positive for: Type II Diabetes Time with diabetes: 1 year Treated with: Oral agents Blood sugar tested every day: No Immunological Neurologic Medical History: Positive for: Neuropathy Oncologic Immunizations Pneumococcal Vaccine: Received Pneumococcal Vaccination: No Implantable Devices None Family and Social History Unknown History: Yes; Current every day smoker - 1 cigarette per day; Marital Status - Single; Alcohol Use: Moderate - Beer; Drug Use: Prior History - Cocaine; Caffeine Use: Rarely; Financial Concerns: No; Food, Clothing or Shelter Needs: No; Support System Lacking: No; Transportation Concerns: No Electronic Signature(s) Signed: 02/04/2021 10:53:43 AM By: Geralyn Corwin DO Signed: 02/04/2021 5:51:46 PM By: Zandra Abts RN, BSN Entered By: Zandra Abts on 02/04/2021 09:29:10 -------------------------------------------------------------------------------- SuperBill Details Patient Name: Date of  Service: [name], HA [name] 02/04/2021 Medical Record Number: [id number] Patient Account Number: [id number] Date of Birth/Sex: Treating RN: 01/27/1966 (55 y.o. Male) Zandra Abts Primary Care Provider: Ricky Stabs Other Clinician: Referring Provider: Treating Provider/Extender: Bonna Gains, Amy Weeks in Treatment: 0 Diagnosis Coding ICD-10 Codes Code Description [phone] Unspecified open wound, left foot, initial encounter S81.802A Unspecified open wound, left lower leg, initial encounter I73.9 Peripheral vascular disease, unspecified E11.621 Type 2 diabetes mellitus with foot ulcer Facility Procedures CPT4 Code: [id number] Description: 99213 - WOUND CARE VISIT-LEV 3 EST PT Modifier: 25 Quantity: 1 CPT4 Code: [id number] Description: 84132 - DEBRIDE W/O ANES NON SELECT Modifier: Quantity: 1 CPT4 Code: [id number] Description: 97597 - DEBRIDE WOUND 1ST 20 SQ CM OR < ICD-10 Diagnosis Description S91.302A Unspecified open wound, left foot, initial encounter Modifier: Quantity: 1 Physician Procedures : CPT4 Code Description Modifier [id number] 99204 - WC PHYS LEVEL 4 - NEW PT ICD-10 Diagnosis Description S91.302A Unspecified open wound, left foot, initial encounter S81.802A Unspecified open wound, left lower leg, initial encounter I73.9 Peripheral  vascular disease, unspecified E11.621 Type 2 diabetes mellitus with foot ulcer Quantity: 1 : [id number] 97597 - WC PHYS DEBR WO ANESTH 20 SQ CM 1 ICD-10 Diagnosis Description S91.302A Unspecified open wound, left foot, initial encounter Quantity: Electronic Signature(s) Signed: 02/13/2021 2:17:30 PM By: Shawn Stall Signed: 02/21/2021 2:38:54 PM By: Geralyn Corwin DO Previous Signature: 02/04/2021 5:22:48 PM Version By: Geralyn Corwin DO Previous Signature: 02/04/2021 5:51:46 PM Version By: Zandra Abts RN, BSN Previous Signature: 02/04/2021 10:53:43 AM Version By: Geralyn Corwin DO Entered By: Shawn Stall on 02/13/2021 14:17:30

## 2021-02-04 NOTE — Progress Notes (Signed)
MYLIN, HIRANO ([id number]) Visit Report for 02/04/2021 Allergy List Details Patient Name: Date of Service: [name], [name] 02/04/2021 9:00 A M Medical Record Number: [id number] Patient Account Number: [id number] Date of Birth/Sex: Treating RN: 20-Feb-1966 (55 y.o. Elizebeth Koller Primary Care Valin Massie: Ricky Stabs Other Clinician: Referring Kenston Longton: Treating Yliana Gravois/Extender: Bonna Gains, Amy Weeks in Treatment: 0 Allergies Active Allergies No Known Drug Allergies Allergy Notes Electronic Signature(s) Signed: 02/04/2021 5:51:46 PM By: Zandra Abts RN, BSN Entered By: Zandra Abts on 02/04/2021 09:23:39 -------------------------------------------------------------------------------- Arrival Information Details Patient Name: Date of Service: [name], [name] 02/04/2021 9:00 A M Medical Record Number: [id number] Patient Account Number: [id number] Date of Birth/Sex: Treating RN: [birth year] (55 y.o. Elizebeth Koller Primary Care Ferdinand Revoir: Ricky Stabs Other Clinician: Referring Haneefah Venturini: Treating Georges Victorio/Extender: Bonna Gains, Amy Weeks in Treatment: 0 Visit Information Patient Arrived: Ambulatory Arrival Time: 09:20 Accompanied By: alone Transfer Assistance: None Patient Identification Verified: Yes Secondary Verification Process Completed: Yes Patient Requires Transmission-Based Precautions: No Patient Has Alerts: Yes Patient Alerts: L ABI: 0.36 TBI: 0 Electronic Signature(s) Signed: 02/04/2021 5:51:46 PM By: Zandra Abts RN, BSN Entered By: Zandra Abts on 02/04/2021 09:30:32 -------------------------------------------------------------------------------- Clinic Level of Care Assessment Details Patient Name: Date of Service: [name], [name] 02/04/2021 9:00 A M Medical Record Number: [id number] Patient Account Number: [id number] Date of Birth/Sex: Treating RN: May 16, 1966 (55 y.o. Elizebeth Koller Primary Care Helix Lafontaine: Ricky Stabs Other  Clinician: Referring Osric Klopf: Treating Serrena Linderman/Extender: Bonna Gains, Amy Weeks in Treatment: 0 Clinic Level of Care Assessment Items TOOL 1 Quantity Score X- 1 0 Use when EandM and Procedure is performed on INITIAL visit ASSESSMENTS - Nursing Assessment / Reassessment X- 1 20 General Physical Exam (combine w/ comprehensive assessment (listed just below) when performed on new pt. evals) X- 1 25 Comprehensive Assessment (HX, ROS, Risk Assessments, Wounds Hx, etc.) ASSESSMENTS - Wound and Skin Assessment / Reassessment []  - 0 Dermatologic / Skin Assessment (not related to wound area) ASSESSMENTS - Ostomy and/or Continence Assessment and Care []  - 0 Incontinence Assessment and Management []  - 0 Ostomy Care Assessment and Management (repouching, etc.) PROCESS - Coordination of Care X - Simple Patient / Family Education for ongoing care 1 15 []  - 0 Complex (extensive) Patient / Family Education for ongoing care X- 1 10 Staff obtains , Records, T Results / Process Orders est []  - 0 Staff telephones HHA, Nursing Homes / Clarify orders / etc []  - 0 Routine Transfer to another Facility (non-emergent condition) []  - 0 Routine Hospital Admission (non-emergent condition) X- 1 15 New Admissions / / Ordering NPWT Apligraf, etc. , []  - 0 Emergency Hospital Admission (emergent condition) PROCESS - Special Needs []  - 0 Pediatric / Minor Patient Management []  - 0 Isolation Patient Management []  - 0 Hearing / Language / Visual special needs []  - 0 Assessment of Community assistance (transportation, D/C planning, etc.) []  - 0 Additional assistance / Altered mentation []  - 0 Support Surface(s) Assessment (bed, cushion, seat, etc.) INTERVENTIONS - Miscellaneous []  - 0 External ear exam []  - 0 Patient Transfer (multiple staff / / Similar devices) []  - 0 Simple Staple / Suture removal (25 or less) []  - 0 Complex Staple /  Suture removal (26 or more) []  - 0 Hypo/Hyperglycemic Management (do not check if billed separately) []  - 0 Ankle / Brachial Index (ABI) - do not check if billed separately Has the patient been seen at the hospital within the  last three years: Yes Total Score: 85 Level Of Care: New/Established - Level 3 Electronic Signature(s) Signed: 02/04/2021 5:51:46 PM By: Zandra AbtsLynch, Shatara RN, BSN Entered By: Zandra AbtsLynch, Shatara on 02/04/2021 17:14:41 -------------------------------------------------------------------------------- Encounter Discharge Information Details Patient Name: Date of Service: [name], [name] 02/04/2021 9:00 A M Medical Record Number: [id number] Patient Account Number: [id number] Date of Birth/Sex: Treating RN: [birth year] (55 y.o. Elizebeth KollerM) Lynch, Shatara Primary Care Coby Antrobus: Ricky StabsStephens, Amy Other Clinician: Referring Bettylou Frew: Treating Iker Nuttall/Extender: Bonna GainsHoffman, Jessica Stephens, Amy Weeks in Treatment: 0 Encounter Discharge Information Items Post Procedure Vitals Discharge Condition: Stable Temperature (F): 98.4 Ambulatory Status: Ambulatory Pulse (bpm): 82 Discharge Destination: Home Respiratory Rate (breaths/min): 16 Transportation: Private Auto Blood Pressure (mmHg): 133/79 Accompanied By: alone Schedule Follow-up Appointment: Yes Clinical Summary of Care: Patient Declined Electronic Signature(s) Signed: 02/04/2021 5:51:46 PM By: Zandra AbtsLynch, Shatara RN, BSN Entered By: Zandra AbtsLynch, Shatara on 02/04/2021 17:15:39 -------------------------------------------------------------------------------- Lower Extremity Assessment Details Patient Name: Date of Service: [name], [name] 02/04/2021 9:00 A M Medical Record Number: [id number] Patient Account Number: [id number] Date of Birth/Sex: Treating RN: [birth year] (55 y.o. Elizebeth KollerM) Lynch, Shatara Primary Care Levone Otten: Ricky StabsStephens, Amy Other Clinician: Referring Aianna Fahs: Treating Thao Bauza/Extender: Bonna GainsHoffman, Jessica Stephens, Amy Weeks in Treatment:  0 Edema Assessment Assessed: [Left: No] [Right: No] Edema: [Left: Ye] [Right: s] Calf Left: Right: Point of Measurement: 34 cm From Medial Instep 40 cm Ankle Left: Right: Point of Measurement: 11 cm From Medial Instep 24 cm Vascular Assessment Pulses: Dorsalis Pedis Palpable: [Left:Yes] Electronic Signature(s) Signed: 02/04/2021 5:51:46 PM By: Zandra AbtsLynch, Shatara RN, BSN Entered By: Zandra AbtsLynch, Shatara on 02/04/2021 09:33:56 -------------------------------------------------------------------------------- Multi Wound Chart Details Patient Name: Date of Service: [name], [name] 02/04/2021 9:00 A M Medical Record Number: [id number] Patient Account Number: [id number] Date of Birth/Sex: Treating RN: [birth year] (55 y.o. 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 Primary Etiology: Extremity N/A N/A Arterial Insufficiency Ulcer Secondary Etiology: Peripheral Arterial Disease, Type II Peripheral Arterial Disease, Type II Peripheral Arterial Disease, Type II Comorbid History: Diabetes, Neuropathy Diabetes, Neuropathy Diabetes, Neuropathy 11/04/2020 11/04/2020 11/04/2020 Date Acquired: 0 0 0 Weeks of Treatment: Open Open Open Wound Status: Yes No No Clustered Wound: 2 N/A N/A Clustered Quantity: 3.6x0.4x0.1 4.7x3.5x0.1 5.1x4.8x0.3 Measurements L x W x D (cm) 1.131 12.92 19.227 A (cm) : rea 0.113 1.292 5.768 Volume (cm)  : 0.00% 0.00% 0.00% % Reduction in A rea: 0.00% 0.00% 0.00% % Reduction in Volume: Full Thickness Without Exposed Unclassifiable Grade 2 Classification: Support Structures Medium Medium Medium Exudate A mount: Serosanguineous Serosanguineous Serosanguineous Exudate Type: red, brown red, brown red, brown Exudate Color: Flat and Intact Flat and Intact Flat and Intact Wound Margin: Medium (34-66%) None Present (0%) Medium (34-66%) Granulation A mount: Red N/A Pink Granulation Quality: Medium (34-66%) Large (67-100%) Medium (34-66%) Necrotic A mount: Adherent Slough Eschar, Adherent Slough Eschar, Adherent Slough Necrotic Tissue: Fat Layer (Subcutaneous Tissue): Yes Fat Layer (Subcutaneous Tissue): Yes Fat Layer (Subcutaneous Tissue): Yes Exposed Structures: Fascia: No Fascia: No Fascia: No Tendon: No Tendon: No Tendon: No Muscle: No Muscle: No Muscle: No Joint: No Joint: No Joint: No Bone: No Bone:  No Bone: No None None None Epithelialization: Chemical/Enzymatic/Mechanical Chemical/Enzymatic/Mechanical Debridement - Selective/Open Wound Debridement: Pre-procedure Verification/Time Out 10:00 10:00 09:58 Taken: N/A N/A Necrotic/Eschar, Slough Tissue Debrided: N/A N/A Non-Viable Tissue Level: N/A N/A 6 Debridement A (sq cm): rea N/A N/A Blade, Forceps Instrument: None None None Bleeding: 0 0 0 Procedural Pain: 0 0 0 Post Procedural Pain: Procedure was tolerated well Procedure was tolerated well Procedure was tolerated well Debridement Treatment Response: 3.6x0.4x0.1 4.7x3.5x0.1 5.1x4.8x0.3 Post Debridement Measurements L x W x D (cm) 0.113 1.292 5.768 Post Debridement Volume: (cm) Debridement Debridement Debridement Procedures Performed: Treatment Notes Electronic Signature(s) Signed: 02/04/2021 10:53:43 AM By: Geralyn Corwin DO Signed: 02/04/2021 5:51:46 PM By: Zandra Abts RN, BSN Entered By: Geralyn Corwin on 02/04/2021  10:19:08 -------------------------------------------------------------------------------- Multi-Disciplinary Care Plan Details Patient Name: Date of Service: [name], [name] 02/04/2021 9:00 A M Medical Record Number: [id number] Patient Account Number: [id number] Date of Birth/Sex: Treating RN: 10-14-1965 (55 y.o. Elizebeth Koller Primary Care Akylah Hascall: Ricky Stabs Other Clinician: Referring Sadey Yandell: Treating Cord Wilczynski/Extender: Caesar Chestnut Weeks in Treatment: 0 Multidisciplinary Care Plan reviewed with physician Active Inactive Nutrition Nursing Diagnoses: Impaired glucose control: actual or potential Potential for alteratiion in Nutrition/Potential for imbalanced nutrition Goals: Patient/caregiver agrees to and verbalizes understanding of need to use nutritional supplements and/or vitamins as prescribed Date Initiated: 02/04/2021 Target Resolution Date: 03/08/2021 Goal Status: Active Patient/caregiver will maintain therapeutic glucose control Date Initiated: 02/04/2021 Target Resolution Date: 03/08/2021 Goal Status: Active Interventions: Assess HgA1c results as ordered upon admission and as needed Assess patient nutrition upon admission and as needed per policy Provide education on elevated blood sugars and impact on wound healing Provide education on nutrition Notes: Wound/Skin Impairment Nursing Diagnoses: Impaired tissue integrity Knowledge deficit related to smoking impact on wound healing Knowledge deficit related to ulceration/compromised skin integrity Goals: Patient will demonstrate a reduced rate of smoking or cessation of smoking Date Initiated: 02/04/2021 Target Resolution Date: 03/08/2021 Goal Status: Active Patient/caregiver will verbalize understanding of skin care regimen Date Initiated: 02/04/2021 Target Resolution Date: 03/08/2021 Goal Status: Active Interventions: Assess patient/caregiver ability to obtain necessary supplies Assess  patient/caregiver ability to perform ulcer/skin care regimen upon admission and as needed Assess ulceration(s) every visit Provide education on smoking Provide education on ulcer and skin care Notes: Electronic Signature(s) Signed: 02/04/2021 5:51:46 PM By: Zandra Abts RN, BSN Entered By: Zandra Abts on 02/04/2021 17:13:51 -------------------------------------------------------------------------------- Pain Assessment Details Patient Name: Date of Service: [name], [name] 02/04/2021 9:00 A M Medical Record Number: [id number] Patient Account Number: [id number] Date of Birth/Sex: Treating RN: May 02, 1966 (55 y.o. Elizebeth Koller Primary Care Lekesha Claw: Ricky Stabs Other Clinician: Referring Liddie Chichester: Treating Sigifredo Pignato/Extender: Bonna Gains, Amy Weeks in Treatment: 0 Active Problems Location of Pain Severity and Description of Pain Patient Has Paino No Site Locations Pain Management and Medication Current Pain Management: Electronic Signature(s) Signed: 02/04/2021 5:51:46 PM By: Zandra Abts RN, BSN Entered By: Zandra Abts on 02/04/2021 09:39:58 -------------------------------------------------------------------------------- Patient/Caregiver Education Details Patient Name: Date of Service: [name], [name] 8/1/2022andnbsp9:00 A M Medical Record Number: [id number] Patient Account Number: [id number] Date of Birth/Gender: Treating RN: [birth year] (55 y.o. Elizebeth Koller Primary Care Physician: Ricky Stabs Other Clinician: Referring Physician: Treating Physician/Extender: Caesar Chestnut Weeks in Treatment: 0 Education Assessment Education Provided To: Patient Education Topics Provided Elevated Blood Sugar/ Impact on Healing: Methods: Explain/Verbal Responses: State content correctly Nutrition: Methods: Explain/Verbal Responses: State content correctly Smoking and Wound Healing: Methods: Explain/Verbal Responses: State content  correctly Wound/Skin Impairment: Methods: Explain/Verbal Responses: State content correctly Electronic Signature(s) Signed: 02/04/2021 5:51:46 PM By: Zandra Abts RN, BSN Entered By: Zandra Abts on 02/04/2021 17:14:07 -------------------------------------------------------------------------------- Wound Assessment Details Patient Name: Date of Service: [name], [name] 02/04/2021 9:00 A M Medical Record Number: [id number] Patient Account Number: [id number] Date of Birth/Sex: Treating RN: 1966-04-23 (55 y.o. Elizebeth Koller Primary Care Hayward Rylander: Ricky Stabs Other Clinician: Referring Carrson Lightcap: Treating Monterio Bob/Extender: Bonna Gains, Amy Weeks in Treatment: 0 Wound Status Wound Number: 1 Primary Etiology: Arterial Insufficiency Ulcer Wound Location: Left, Proximal, Lateral Lower Leg Wound Status: Open Wounding Event: Gradually Appeared Comorbid History: Peripheral Arterial Disease, Type II Diabetes, Neuropathy Date Acquired: 11/04/2020 Weeks Of Treatment: 0 Clustered Wound: Yes Photos Wound Measurements Length: (cm) 3.6 Width: (cm) 0.4 Depth: (cm) 0.1 Clustered Quantity: 2 Area: (cm) 1.131 Volume: (cm) 0.113 Wound Description Classification: Full Thickness Without Exposed Support Structures Wound Margin: Flat and Intact Exudate Amount: Medium Exudate Type: Serosanguineous Exudate Color: red, brown Foul Odor After Cleansing: No Slough/Fibrino Ye % Reduction in Area: 0% % Reduction in Volume: 0% Epithelialization: None Tunneling: No Undermining: No s Wound Bed Granulation Amount: Medium (34-66%) Exposed Structure Granulation Quality: Red Fascia Exposed: No Necrotic Amount: Medium (34-66%) Fat Layer (Subcutaneous Tissue) Exposed: Yes Necrotic Quality: Adherent Slough Tendon Exposed: No Muscle Exposed: No Joint Exposed: No Bone Exposed: No Treatment Notes Wound #1 (Lower Leg) Wound Laterality: Left, Lateral, Proximal Cleanser Soap and  Water Discharge Instruction: May shower and wash wound with dial antibacterial soap and water prior to dressing change. Peri-Wound Care Topical Primary Dressing Santyl Ointment Discharge Instruction: Apply nickel thick amount to wound bed as instructed Secondary Dressing Woven Gauze Sponge, Non-Sterile 4x4 in Discharge Instruction: Apply over primary dressing as directed. Secured With [employer], 4.5x3.1 (in/yd) Discharge Instruction: Secure with Kerlix as directed. 84M Medipore H Soft Cloth Surgical T ape, 2x2 (in/yd) Discharge Instruction: Secure dressing with tape as directed. Compression Wrap Compression Stockings Add-Ons Electronic Signature(s) Signed: 02/04/2021 1:33:39 PM By: Karl Ito Signed: 02/04/2021 5:51:46 PM By: Zandra Abts RN, BSN Entered By: Karl Ito on 02/04/2021 09:53:28 -------------------------------------------------------------------------------- Wound Assessment Details Patient Name: Date of Service: [name], [name] 02/04/2021 9:00 A M Medical Record Number: [id number] Patient Account Number: [id number] Date of Birth/Sex: Treating RN: January 15, 1966 (55 y.o. Elizebeth Koller Primary Care Lafe Clerk: Ricky Stabs Other Clinician: Referring Benno Brensinger: Treating Shital Crayton/Extender: Bonna Gains, Amy Weeks in Treatment: 0 Wound Status Wound Number: 2 Primary Etiology: Arterial Insufficiency Ulcer Wound Location: Left, Lateral Lower Leg Wound Status: Open Wounding Event: Gradually Appeared Comorbid History: Peripheral Arterial Disease, Type II Diabetes, Neuropathy Date Acquired: 11/04/2020 Weeks Of Treatment: 0 Clustered Wound: No Photos Wound Measurements Length: (cm) 4.7 Width: (cm) 3.5 Depth: (cm) 0.1 Area: (cm) 12.92 Volume: (cm) 1.292 % Reduction in Area: 0% % Reduction in Volume: 0% Epithelialization: None Tunneling: No Undermining: No Wound Description Classification: Unclassifiable Wound Margin: Flat and  Intact Exudate Amount: Medium Exudate Type: Serosanguineous Exudate Color: red, brown Foul Odor After Cleansing: No Slough/Fibrino Yes Wound Bed Granulation Amount: None Present (0%) Exposed Structure Necrotic Amount: Large (67-100%) Fascia Exposed: No Necrotic Quality: Eschar, Adherent Slough Fat Layer (Subcutaneous Tissue) Exposed: Yes Tendon Exposed: No Muscle Exposed: No Joint Exposed: No Bone Exposed: No Treatment Notes Wound #2 (Lower Leg) Wound Laterality: Left, Lateral Cleanser Soap and Water Discharge Instruction: May shower and wash wound with dial antibacterial soap and water prior to dressing change. Peri-Wound Care Topical Primary Dressing Santyl Ointment  Discharge Instruction: Apply nickel thick amount to wound bed as instructed Secondary Dressing Woven Gauze Sponge, Non-Sterile 4x4 in Discharge Instruction: Apply over primary dressing as directed. Secured With [employer], 4.5x3.1 (in/yd) Discharge Instruction: Secure with Kerlix as directed. 44M Medipore H Soft Cloth Surgical T ape, 2x2 (in/yd) Discharge Instruction: Secure dressing with tape as directed. Compression Wrap Compression Stockings Add-Ons Electronic Signature(s) Signed: 02/04/2021 1:33:39 PM By: Karl Ito Signed: 02/04/2021 5:51:46 PM By: Zandra Abts RN, BSN Entered By: Karl Ito on 02/04/2021 09:53:51 -------------------------------------------------------------------------------- Wound Assessment Details Patient Name: Date of Service: [name], [name] 02/04/2021 9:00 A M Medical Record Number: [id number] Patient Account Number: [id number] Date of Birth/Sex: Treating RN: 01/25/1966 (55 y.o. Elizebeth Koller Primary Care Brittaney Beaulieu: Ricky Stabs Other Clinician: Referring Otilia Kareem: Treating Sulamita Lafountain/Extender: Bonna Gains, Amy Weeks in Treatment: 0 Wound Status Wound Number: 3 Primary Etiology: Diabetic Wound/Ulcer of the Lower Extremity Wound  Location: Left Calcaneus Secondary Etiology: Arterial Insufficiency Ulcer Wounding Event: Gradually Appeared Wound Status: Open Date Acquired: 11/04/2020 Comorbid History: Peripheral Arterial Disease, Type II Diabetes, Neuropathy Weeks Of Treatment: 0 Clustered Wound: No Photos Wound Measurements Length: (cm) 5.1 Width: (cm) 4.8 Depth: (cm) 0.3 Area: (cm) 19.227 Volume: (cm) 5.768 % Reduction in Area: 0% % Reduction in Volume: 0% Epithelialization: None Tunneling: No Undermining: No Wound Description Classification: Grade 2 Wound Margin: Flat and Intact Exudate Amount: Medium Exudate Type: Serosanguineous Exudate Color: red, brown Foul Odor After Cleansing: No Slough/Fibrino Yes Wound Bed Granulation Amount: Medium (34-66%) Exposed Structure Granulation Quality: Pink Fascia Exposed: No Necrotic Amount: Medium (34-66%) Fat Layer (Subcutaneous Tissue) Exposed: Yes Necrotic Quality: Eschar, Adherent Slough Tendon Exposed: No Muscle Exposed: No Joint Exposed: No Bone Exposed: No Treatment Notes Wound #3 (Calcaneus) Wound Laterality: Left Cleanser Soap and Water Discharge Instruction: May shower and wash wound with dial antibacterial soap and water prior to dressing change. Peri-Wound Care Topical Primary Dressing Santyl Ointment Discharge Instruction: Apply nickel thick amount to wound bed as instructed Secondary Dressing Woven Gauze Sponge, Non-Sterile 4x4 in Discharge Instruction: Apply over primary dressing as directed. Secured With [employer], 4.5x3.1 (in/yd) Discharge Instruction: Secure with Kerlix as directed. 44M Medipore H Soft Cloth Surgical T ape, 2x2 (in/yd) Discharge Instruction: Secure dressing with tape as directed. Compression Wrap Compression Stockings Add-Ons Electronic Signature(s) Signed: 02/04/2021 1:33:39 PM By: Karl Ito Signed: 02/04/2021 5:51:46 PM By: Zandra Abts RN, BSN Entered By: Karl Ito on 02/04/2021  09:54:11 -------------------------------------------------------------------------------- Vitals Details Patient Name: Date of Service: [name], [name] 02/04/2021 9:00 A M Medical Record Number: [id number] Patient Account Number: [id number] Date of Birth/Sex: Treating RN: [birth year] (55 y.o. Elizebeth Koller Primary Care Cherina Dhillon: Ricky Stabs Other Clinician: Referring Junious Ragone: Treating [place] Huberty/Extender: Bonna Gains, Amy Weeks in Treatment: 0 Vital Signs Time Taken: 09:20 Temperature (F): 98.4 Height (in): 67 Pulse (bpm): 82 Source: Stated Respiratory Rate (breaths/min): 16 Weight (lbs): 190 Blood Pressure (mmHg): 133/79 Source: Stated Reference Range: 80 - 120 mg / dl Body Mass Index (BMI): 29.8 Electronic Signature(s) Signed: 02/04/2021 5:51:46 PM By: Zandra Abts RN, BSN Entered By: Zandra Abts on 02/04/2021 09:22:13

## 2021-02-06 ENCOUNTER — Other Ambulatory Visit: Payer: Self-pay

## 2021-02-12 ENCOUNTER — Ambulatory Visit: Payer: Self-pay | Admitting: Clinical

## 2021-02-13 ENCOUNTER — Ambulatory Visit (INDEPENDENT_AMBULATORY_CARE_PROVIDER_SITE_OTHER): Payer: Self-pay | Admitting: Physician Assistant

## 2021-02-13 ENCOUNTER — Encounter: Payer: Self-pay | Admitting: Physician Assistant

## 2021-02-13 ENCOUNTER — Other Ambulatory Visit: Payer: Self-pay

## 2021-02-13 VITALS — BP 126/79 | HR 79 | Temp 98.2°F | Resp 20 | Ht 66.0 in | Wt 182.0 lb

## 2021-02-13 DIAGNOSIS — I739 Peripheral vascular disease, unspecified: Secondary | ICD-10-CM

## 2021-02-13 NOTE — Progress Notes (Signed)
  POST OPERATIVE OFFICE NOTE    CC:  F/u for surgery  HPI:  This is a 55 y.o. male who is s/p 1.Harvest of left leg great saphenous vein 2.  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 Chestine Spore on 01/14/21.He tolerated the surgery well and did well post operatively. He was discharge POD# 4.  He says his incisions have healed well. He is still having pain in area of non healing wound on left leg/ foot. He has been following up with wound care center for management of his leg wounds. His next appointment with them is on 02/18/21. He denies any claudication, rest pain or new wounds. He is ambulating with crutches.   No Known Allergies  Current Outpatient Medications  Medication Sig Dispense Refill   acetaminophen (TYLENOL) 325 MG tablet Take 2 tablets (650 mg total) by mouth every 4 (four) hours as needed. 30 tablet 2   aspirin 81 MG EC tablet Take 1 tablet (81 mg total) by mouth daily. Swallow whole. 30 tablet 11   atorvastatin (LIPITOR) 40 MG tablet Take 1 tablet (40 mg total) by mouth daily. 30 tablet 3   bacitracin ointment Apply 1 application topically 2 (two) times daily. 120 g 0   collagenase (SANTYL) ointment Apply topically daily. Apply to left ankle wound nickel thick, followed by wet to dry dressing. 15 g 0   ferrous sulfate 325 (65 FE) MG tablet Take 1 tablet (325 mg total) by mouth daily with breakfast. 120 tablet 0   gabapentin (NEURONTIN) 300 MG capsule Take 1 capsule (300 mg total) by mouth 2 (two) times daily. 180 capsule 0   ibuprofen (ADVIL) 600 MG tablet Take 1 tablet (600 mg total) by mouth every 8 (eight) hours as needed. 30 tablet 0   metFORMIN (GLUCOPHAGE) 500 MG tablet Take 1 tablet (500 mg total) by mouth daily with breakfast. 90 tablet 0   nicotine (NICODERM CQ - DOSED IN MG/24 HOURS) 14 mg/24hr patch Place 1 patch (14 mg total) onto the skin daily. 28 patch 0   pantoprazole  (PROTONIX) 40 MG tablet Take 1 tablet (40 mg total) by mouth daily. 90 tablet 0   No current facility-administered medications for this visit.     ROS:  See HPI  Physical Exam:  Vitals:   02/13/21 0846  BP: 126/79  Pulse: 79  Resp: 20  Temp: 98.2 F (36.8 C)  SpO2: 100%  Weight: 182 lb (82.6 kg)  Height: 5\' 6"  (1.676 m)   General: well appearing, well nourished, in no distress Incision:  left groin, saphenectomy sites and lower leg incisions have all healed very nicely Extremities:  well perfused and warm. 2+ femoral pulse. Doppler signal in LLE bypass graft, Dp, PT signals in left foot.   Left distal leg and heel wounds re dressed Neuro: alert and oriented   Assessment/Plan:  This is a 55 y.o. male who is s/p:1.Harvest of left leg great saphenous vein 2.  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 57 on 01/14/21 - continue follow up with Wound care center, next appointment on 8/15 - continue Atorvastatin and Aspirin - He will return in 2-3 weeks for LLE bypass duplex and ABIs   9/15, PA-C [facility] [phone]  Clinic MD:  [phone]

## 2021-02-15 ENCOUNTER — Other Ambulatory Visit: Payer: Self-pay

## 2021-02-15 DIAGNOSIS — I739 Peripheral vascular disease, unspecified: Secondary | ICD-10-CM

## 2021-02-18 ENCOUNTER — Encounter (HOSPITAL_BASED_OUTPATIENT_CLINIC_OR_DEPARTMENT_OTHER): Payer: 59 | Admitting: Internal Medicine

## 2021-02-18 ENCOUNTER — Other Ambulatory Visit: Payer: Self-pay

## 2021-02-18 DIAGNOSIS — S81802A Unspecified open wound, left lower leg, initial encounter: Secondary | ICD-10-CM

## 2021-02-18 NOTE — Progress Notes (Addendum)
Tony Hicks, Tony Hicks ([id number]) Visit Report for 02/18/2021 Arrival Information Details Patient Name: Date of Service: Tony Hicks [name] 02/18/2021 9:00 A M Medical Record Number: [id number] Patient Account Number: [id number] Date of Birth/Sex: Treating RN: 22-Dec-1965 (55 y.o. [name], [name] Primary Care Jilene Spohr: Ricky Stabs Other Clinician: Referring Juana Haralson: Treating Perry Molla/Extender: Bonna Gains, Amy Weeks in Treatment: 2 Visit Information History Since Last Visit Added or deleted any medications: No Patient Arrived: Crutches Any new allergies or adverse reactions: No Arrival Time: 10:05 Had a fall or experienced change in No Accompanied By: self activities of daily living that may affect Transfer Assistance: None risk of falls: Patient Identification Verified: Yes Signs or symptoms of abuse/neglect since last visito No Secondary Verification Process Completed: Yes Hospitalized since last visit: No Patient Requires Transmission-Based Precautions: No Implantable device outside of the clinic excluding No Patient Has Alerts: Yes cellular tissue based products placed in the center Patient Alerts: L ABI: 0.36 TBI: 0 since last visit: Has Dressing in Place as Prescribed: Yes Pain Present Now: Yes Electronic Signature(s) Signed: 02/19/2021 5:16:57 PM By: Fonnie Mu RN Entered By: Fonnie Mu on 02/18/2021 10:05:21 -------------------------------------------------------------------------------- Encounter Discharge Information Details Patient Name: Date of Service: Tony Hicks, Tony Hicks 02/18/2021 9:00 A M Medical Record Number: [id number] Patient Account Number: [id number] Date of Birth/Sex: Treating RN: 18-Aug-1965 (55 y.o. [name] Primary Care Marshall Roehrich: Ricky Stabs Other Clinician: Referring Nupur Hohman: Treating Zurri Rudden/Extender: Bonna Gains, Amy Weeks in Treatment: 2 Encounter Discharge Information Items Post Procedure  Vitals Discharge Condition: Stable Temperature (F): 98.7 Ambulatory Status: Crutches Pulse (bpm): 67 Discharge Destination: Home Respiratory Rate (breaths/min): 17 Transportation: Private Auto Blood Pressure (mmHg): 142/79 Accompanied By: self Schedule Follow-up Appointment: Yes Clinical Summary of Care: Electronic Signature(s) Signed: 02/18/2021 5:38:02 PM By: Shawn Stall Entered By: Shawn Stall on 02/18/2021 13:11:37 -------------------------------------------------------------------------------- Lower Extremity Assessment Details Patient Name: Date of Service: Tony Hicks, Tony Hicks 02/18/2021 9:00 A M Medical Record Number: [id number] Patient Account Number: [id number] Date of Birth/Sex: Treating RN: 1966-02-28 (55 y.o. [name], [name] Primary Care Mazzie Brodrick: Ricky Stabs Other Clinician: Referring Judeth Gilles: Treating Tifany Hirsch/Extender: Bonna Gains, Amy Weeks in Treatment: 2 Edema Assessment Assessed: [Left: No] [Right: No] Edema: [Left: Ye] [Right: s] Calf Left: Right: Point of Measurement: 34 cm From Medial Instep 40 cm Ankle Left: Right: Point of Measurement: 11 cm From Medial Instep 25 cm Vascular Assessment Pulses: Dorsalis Pedis Palpable: [Left:Yes] Posterior Tibial Palpable: [Left:Yes] Electronic Signature(s) Signed: 02/19/2021 5:16:57 PM By: Fonnie Mu RN Entered By: Fonnie Mu on 02/18/2021 10:07:05 -------------------------------------------------------------------------------- Multi Wound Chart Details Patient Name: Date of Service: Tony Hicks, Tony Hicks 02/18/2021 9:00 A M Medical Record Number: [id number] Patient Account Number: [id number] Date of Birth/Sex: Treating RN: [birth year] (55 y.o. Lytle Michaels Primary Care Jasmeet Gehl: Ricky Stabs Other Clinician: Referring Brandn Mcgath: Treating Loren Sawaya/Extender: Bonna Gains, Amy Weeks in Treatment: 2 Vital Signs Height(in): 67 Pulse(bpm): 69 Weight(lbs):  190 Blood Pressure(mmHg): 142/79 Body Mass Index(BMI): 30 Temperature(F): 98.7 Respiratory Rate(breaths/min): 17 Photos: [1:Left, Proximal, Lateral Lower Leg] [2:Left, Lateral Lower Leg] [3:Left Calcaneus] Wound Location: [1:Gradually Appeared] [2:Gradually Appeared] [3:Gradually Appeared] Wounding Event: [1:Arterial Insufficiency Ulcer] [2:Arterial Insufficiency Ulcer] [3:Diabetic Wound/Ulcer of the Lower] Primary Etiology: [1:N/A] [2:N/A] [3:Extremity Arterial Insufficiency Ulcer] Secondary Etiology: [1:Peripheral Arterial Disease, Type II Peripheral Arterial Disease, Type II Peripheral Arterial Disease, Type II] Comorbid History: [1:Diabetes, Neuropathy 11/04/2020] [2:Diabetes, Neuropathy 11/04/2020] [3:Diabetes, Neuropathy 11/04/2020] Date Acquired: [1:2] [2:2] [3:2] Weeks of Treatment: [1:Open] [2:Open] [3:Open] Wound Status: [1:Yes] [2:No] [  3:No] Clustered Wound: [1:2] [2:N/A] [3:N/A] Clustered Quantity: [1:4.8x4.2x0.1] [2:4.3x4x0.2] [3:4.5x4x0.2] Measurements L x W x D (cm) [1:15.834] [2:13.509] [3:14.137] A (cm) : rea [1:1.583] [2:2.702] [3:2.827] Volume (cm) : [1:-1300.00%] [2:-4.60%] [3:26.50%] % Reduction in A [1:rea: -1300.90%] [2:-109.10%] [3:51.00%] % Reduction in Volume: [1:Full Thickness Without Exposed] [2:Unclassifiable] [3:Grade 2] Classification: [1:Support Structures Medium] [2:Medium] [3:Medium] Exudate A mount: [1:Serosanguineous] [2:Serosanguineous] [3:Serosanguineous] Exudate Type: [1:red, brown] [2:red, brown] [3:red, brown] Exudate Color: [1:Flat and Intact] [2:Flat and Intact] [3:Flat and Intact] Wound Margin: [1:Medium (34-66%)] [2:None Present (0%)] [3:Medium (34-66%)] Granulation A mount: [1:Red] [2:N/A] [3:Pink] Granulation Quality: [1:Medium (34-66%)] [2:Large (67-100%)] [3:Medium (34-66%)] Necrotic A mount: [1:Adherent Slough] [2:Eschar, Adherent Slough] [3:Eschar, Adherent Slough] Necrotic Tissue: [1:Fat Layer (Subcutaneous Tissue): Yes Fat Layer  (Subcutaneous Tissue): Yes Fat Layer (Subcutaneous Tissue): Yes] Exposed Structures: [1:Fascia: No Tendon: No Muscle: No Joint: No Bone: No None] [2:Fascia: No Tendon: No Muscle: No Joint: No Bone: No None] [3:Fascia: No Tendon: No Muscle: No Joint: No Bone: No None] Epithelialization: [1:N/A] [2:Debridement - Selective/Open Wound N/A] Debridement: Pre-procedure Verification/Time Out N/A [2:11:15] [3:N/A] Taken: [1:N/A] [2:Other] [3:N/A] Pain Control: [1:N/A] [2:Necrotic/Eschar] [3:N/A] Tissue Debrided: [1:N/A] [2:Non-Viable Tissue] [3:N/A] Level: [1:N/A] [2:12] [3:N/A] Debridement A (sq cm): [1:rea N/A] [2:Blade, Forceps, Scissors] [3:N/A] Instrument: [1:N/A] [2:Minimum] [3:N/A] Bleeding: [1:N/A] [2:Pressure] [3:N/A] Hemostasis A chieved: [1:N/A] [2:Procedure was tolerated well] [3:N/A] Debridement Treatment Response: [1:N/A] [2:4.3x4x0.2] [3:N/A] Post Debridement Measurements L x W x D (cm) [1:N/A] [2:2.702] [3:N/A] Post Debridement Volume: (cm) [1:N/A] [2:Debridement] [3:N/A] Treatment Notes Electronic Signature(s) Signed: 02/18/2021 12:26:54 PM By: Geralyn Corwin DO Signed: 02/18/2021 5:36:52 PM By: Antonieta Iba Entered By: Geralyn Corwin on 02/18/2021 12:13:51 -------------------------------------------------------------------------------- Multi-Disciplinary Care Plan Details Patient Name: Date of Service: Tony Hicks, Tony Hicks 02/18/2021 9:00 A M Medical Record Number: [id number] Patient Account Number: [id number] Date of Birth/Sex: Treating RN: [birth year] (55 y.o. Lytle Michaels Primary Care Elisandro Jarrett: Ricky Stabs Other Clinician: Referring Julis Haubner: Treating Donya Tomaro/Extender: Caesar Chestnut Weeks in Treatment: 2 Multidisciplinary Care Plan reviewed with physician Active Inactive Nutrition Nursing Diagnoses: Impaired glucose control: actual or potential Potential for alteratiion in Nutrition/Potential for imbalanced  nutrition Goals: Patient/caregiver agrees to and verbalizes understanding of need to use nutritional supplements and/or vitamins as prescribed Date Initiated: 02/04/2021 Target Resolution Date: 03/08/2021 Goal Status: Active Patient/caregiver will maintain therapeutic glucose control Date Initiated: 02/04/2021 Target Resolution Date: 03/08/2021 Goal Status: Active Interventions: Assess HgA1c results as ordered upon admission and as needed Assess patient nutrition upon admission and as needed per policy Provide education on elevated blood sugars and impact on wound healing Provide education on nutrition Treatment Activities: Education provided on Nutrition : 02/04/2021 Notes: Wound/Skin Impairment Nursing Diagnoses: Impaired tissue integrity Knowledge deficit related to smoking impact on wound healing Knowledge deficit related to ulceration/compromised skin integrity Goals: Patient will demonstrate a reduced rate of smoking or cessation of smoking Date Initiated: 02/04/2021 Target Resolution Date: 03/08/2021 Goal Status: Active Patient/caregiver will verbalize understanding of skin care regimen Date Initiated: 02/04/2021 Target Resolution Date: 03/08/2021 Goal Status: Active Interventions: Assess patient/caregiver ability to obtain necessary supplies Assess patient/caregiver ability to perform ulcer/skin care regimen upon admission and as needed Assess ulceration(s) every visit Provide education on smoking Provide education on ulcer and skin care Notes: Electronic Signature(s) Signed: 02/18/2021 10:01:29 AM By: Antonieta Iba Entered By: Antonieta Iba on 02/18/2021 10:01:28 -------------------------------------------------------------------------------- Pain Assessment Details Patient Name: Date of Service: Tony Hicks, Tony Hicks 02/18/2021 9:00 A M Medical Record Number: [id number] Patient Account Number: [id number] Date of Birth/Sex: Treating  RN: 13-Mar-1966 (55 y.o. [name],  [name] Primary Care Flemon Kelty: Ricky Stabs Other Clinician: Referring Khalis Hittle: Treating Shantina Chronister/Extender: Bonna Gains, Amy Weeks in Treatment: 2 Active Problems Location of Pain Severity and Description of Pain Patient Has Paino Yes Patient Has Paino Yes Site Locations Pain Location: Generalized Pain, Pain in Ulcers With Dressing Change: Yes Duration of the Pain. Constant / Intermittento Intermittent Rate the pain. Current Pain Level: 10 Worst Pain Level: 10 Least Pain Level: 0 Tolerable Pain Level: 10 Character of Pain Describe the Pain: Aching Pain Management and Medication Current Pain Management: Medication: Yes Cold Application: No Rest: Yes Massage: No Activity: No T.E.N.S.: No Heat Application: No Leg drop or elevation: No Is the Current Pain Management Adequate: Adequate How does your wound impact your activities of daily livingo Sleep: Yes Bathing: No Appetite: Yes Relationship With Others: No Bladder Continence: No Emotions: No Bowel Continence: No Work: No Toileting: No Drive: No Dressing: No Hobbies: No Electronic Signature(s) Signed: 02/19/2021 5:16:57 PM By: Fonnie Mu RN Entered By: Fonnie Mu on 02/18/2021 10:06:09 -------------------------------------------------------------------------------- Patient/Caregiver Education Details Patient Name: Date of Service: Tony Hicks, Tony Hicks 8/15/2022andnbsp9:00 A M Medical Record Number: [id number] Patient Account Number: [id number] Date of Birth/Gender: Treating RN: May 16, 1966 (55 y.o. Lytle Michaels Primary Care Physician: Ricky Stabs Other Clinician: Referring Physician: Treating Physician/Extender: Caesar Chestnut Weeks in Treatment: 2 Education Assessment Education Provided To: Patient Education Topics Provided Elevated Blood Sugar/ Impact on Healing: Methods: Explain/Verbal Responses: State content correctly Offloading: Methods:  Explain/Verbal Responses: State content correctly Smoking and Wound Healing: Methods: Explain/Verbal, Printed Responses: State content correctly Wound/Skin Impairment: Methods: Explain/Verbal, Printed Responses: State content correctly Electronic Signature(s) Signed: 02/18/2021 5:36:52 PM By: Antonieta Iba Entered By: Antonieta Iba on 02/18/2021 10:02:09 -------------------------------------------------------------------------------- Wound Assessment Details Patient Name: Date of Service: Tony Hicks, Tony Hicks 02/18/2021 9:00 A M Medical Record Number: [id number] Patient Account Number: [id number] Date of Birth/Sex: Treating RN: 11-14-1965 (55 y.o. [name], [name] Primary Care Izaak Sahr: Ricky Stabs Other Clinician: Referring Jaken Fregia: Treating Garrin Kirwan/Extender: Bonna Gains, Amy Weeks in Treatment: 2 Wound Status Wound Number: 1 Primary Etiology: Arterial Insufficiency Ulcer Wound Location: Left, Proximal, Lateral Lower Leg Wound Status: Open Wounding Event: Gradually Appeared Comorbid History: Peripheral Arterial Disease, Type II Diabetes, Neuropathy Date Acquired: 11/04/2020 Weeks Of Treatment: 2 Clustered Wound: Yes Photos Wound Measurements Length: (cm) 4.8 Width: (cm) 4.2 Depth: (cm) 0.1 Clustered Quantity: 2 Area: (cm) 15.834 Volume: (cm) 1.583 % Reduction in Area: -1300% % Reduction in Volume: -1300.9% Epithelialization: None Tunneling: No Undermining: No Wound Description Classification: Full Thickness Without Exposed Support Structures Wound Margin: Flat and Intact Exudate Amount: Medium Exudate Type: Serosanguineous Exudate Color: red, brown Foul Odor After Cleansing: No Slough/Fibrino Yes Wound Bed Granulation Amount: Medium (34-66%) Exposed Structure Granulation Quality: Red Fascia Exposed: No Necrotic Amount: Medium (34-66%) Fat Layer (Subcutaneous Tissue) Exposed: Yes Necrotic Quality: Adherent Slough Tendon Exposed:  No Muscle Exposed: No Joint Exposed: No Bone Exposed: No Treatment Notes Wound #1 (Lower Leg) Wound Laterality: Left, Lateral, Proximal Cleanser Soap and Water Discharge Instruction: May shower and wash wound with dial antibacterial soap and water prior to dressing change. Peri-Wound Care Topical Primary Dressing Santyl Ointment Discharge Instruction: Apply nickel thick amount to wound bed as instructed Secondary Dressing Woven Gauze Sponge, Non-Sterile 4x4 in Discharge Instruction: Apply over primary dressing as directed. ABD Pad, 5x9 Discharge Instruction: Apply over primary dressing as directed. Secured With [employer], 4.5x3.1 (in/yd) Discharge Instruction: Secure with Kerlix as  directed. 81M Medipore H Soft Cloth Surgical T ape, 2x2 (in/yd) Discharge Instruction: Secure dressing with tape as directed. Compression Wrap Compression Stockings Add-Ons Electronic Signature(s) Signed: 02/19/2021 11:32:43 AM By: Karl Itoawkins, Destiny Signed: 02/19/2021 5:16:57 PM By: Fonnie MuBreedlove, [name] RN Entered By: Karl Itoawkins, Destiny on 02/18/2021 10:09:46 -------------------------------------------------------------------------------- Wound Assessment Details Patient Name: Date of Service: Tony Hicks, Tony Hicks 02/18/2021 9:00 A M Medical Record Number: [id number] Patient Account Number: [id number] Date of Birth/Sex: Treating RN: 11-Oct-1965 (55 y.o. Charlean MerlM) Breedlove, [name] Primary Care Ignazio Kincaid: Ricky StabsStephens, Amy Other Clinician: Referring Tu Bayle: Treating Cordarrell Sane/Extender: Bonna GainsHoffman, Jessica Stephens, Amy Weeks in Treatment: 2 Wound Status Wound Number: 2 Primary Etiology: Arterial Insufficiency Ulcer Wound Location: Left, Lateral Lower Leg Wound Status: Open Wounding Event: Gradually Appeared Comorbid History: Peripheral Arterial Disease, Type II Diabetes, Neuropathy Date Acquired: 11/04/2020 Weeks Of Treatment: 2 Clustered Wound: No Photos Wound Measurements Length: (cm) 4.3 Width: (cm)  4 Depth: (cm) 0.2 Area: (cm) 13.509 Volume: (cm) 2.702 % Reduction in Area: -4.6% % Reduction in Volume: -109.1% Epithelialization: None Tunneling: No Undermining: No Wound Description Classification: Unclassifiable Wound Margin: Flat and Intact Exudate Amount: Medium Exudate Type: Serosanguineous Exudate Color: red, brown Foul Odor After Cleansing: No Slough/Fibrino Yes Wound Bed Granulation Amount: None Present (0%) Exposed Structure Necrotic Amount: Large (67-100%) Fascia Exposed: No Necrotic Quality: Eschar, Adherent Slough Fat Layer (Subcutaneous Tissue) Exposed: Yes Tendon Exposed: No Muscle Exposed: No Joint Exposed: No Bone Exposed: No Treatment Notes Wound #2 (Lower Leg) Wound Laterality: Left, Lateral Cleanser Soap and Water Discharge Instruction: May shower and wash wound with dial antibacterial soap and water prior to dressing change. Peri-Wound Care Topical Primary Dressing Santyl Ointment Discharge Instruction: Apply nickel thick amount to wound bed as instructed Secondary Dressing Woven Gauze Sponge, Non-Sterile 4x4 in Discharge Instruction: Apply over primary dressing as directed. ABD Pad, 5x9 Discharge Instruction: Apply over primary dressing as directed. Secured With [employer] Roll Sterile, 4.5x3.1 (in/yd) Discharge Instruction: Secure with Kerlix as directed. 81M Medipore H Soft Cloth Surgical T ape, 2x2 (in/yd) Discharge Instruction: Secure dressing with tape as directed. Compression Wrap Compression Stockings Add-Ons Electronic Signature(s) Signed: 02/19/2021 11:32:43 AM By: Karl Itoawkins, Destiny Signed: 02/19/2021 5:16:57 PM By: Fonnie MuBreedlove, [name] RN Entered By: Karl Itoawkins, Destiny on 02/18/2021 10:12:13 -------------------------------------------------------------------------------- Wound Assessment Details Patient Name: Date of Service: Tony Hicks, Tony Hicks 02/18/2021 9:00 A M Medical Record Number: [id number] Patient Account Number: [id number] Date of  Birth/Sex: Treating RN: 11-Oct-1965 (55 y.o. Charlean MerlM) Breedlove, [name] Primary Care Otto Felkins: Ricky StabsStephens, Amy Other Clinician: Referring Bridgid Printz: Treating Pallie Swigert/Extender: Bonna GainsHoffman, Jessica Stephens, Amy Weeks in Treatment: 2 Wound Status Wound Number: 3 Primary Etiology: Diabetic Wound/Ulcer of the Lower Extremity Wound Location: Left Calcaneus Secondary Etiology: Arterial Insufficiency Ulcer Wounding Event: Gradually Appeared Wound Status: Open Date Acquired: 11/04/2020 Comorbid History: Peripheral Arterial Disease, Type II Diabetes, Neuropathy Weeks Of Treatment: 2 Clustered Wound: No Photos Wound Measurements Length: (cm) 4.5 Width: (cm) 4 Depth: (cm) 0.2 Area: (cm) 14.137 Volume: (cm) 2.827 % Reduction in Area: 26.5% % Reduction in Volume: 51% Epithelialization: None Tunneling: No Undermining: No Wound Description Classification: Grade 2 Wound Margin: Flat and Intact Exudate Amount: Medium Exudate Type: Serosanguineous Exudate Color: red, brown Foul Odor After Cleansing: No Slough/Fibrino Yes Wound Bed Granulation Amount: Medium (34-66%) Exposed Structure Granulation Quality: Pink Fascia Exposed: No Necrotic Amount: Medium (34-66%) Fat Layer (Subcutaneous Tissue) Exposed: Yes Necrotic Quality: Eschar, Adherent Slough Tendon Exposed: No Muscle Exposed: No Joint Exposed: No Bone Exposed: No Treatment Notes Wound #3 (Calcaneus) Wound Laterality: Left Cleanser Soap  and Water Discharge Instruction: May shower and wash wound with dial antibacterial soap and water prior to dressing change. Peri-Wound Care Topical Primary Dressing Santyl Ointment Discharge Instruction: Apply nickel thick amount to wound bed as instructed Secondary Dressing Woven Gauze Sponge, Non-Sterile 4x4 in Discharge Instruction: Apply over primary dressing as directed. ABD Pad, 5x9 Discharge Instruction: Apply over primary dressing as directed. Secured With [employer], 4.5x3.1  (in/yd) Discharge Instruction: Secure with Kerlix as directed. 16M Medipore H Soft Cloth Surgical T ape, 2x2 (in/yd) Discharge Instruction: Secure dressing with tape as directed. Compression Wrap Compression Stockings Add-Ons Electronic Signature(s) Signed: 02/19/2021 11:32:43 AM By: Karl Ito Signed: 02/19/2021 5:16:57 PM By: Fonnie Mu RN Entered By: Karl Ito on 02/18/2021 10:13:22 -------------------------------------------------------------------------------- Vitals Details Patient Name: Date of Service: Tony Hicks, Tony Hicks 02/18/2021 9:00 A M Medical Record Number: [id number] Patient Account Number: [id number] Date of Birth/Sex: Treating RN: Sep 07, 1965 (55 y.o. [name], [name] Primary Care Joziah Dollins: Ricky Stabs Other Clinician: Referring Carnesha Maravilla: Treating Brilee Port/Extender: Bonna Gains, Amy Weeks in Treatment: 2 Vital Signs Time Taken: 10:05 Temperature (F): 98.7 Height (in): 67 Pulse (bpm): 69 Weight (lbs): 190 Respiratory Rate (breaths/min): 17 Body Mass Index (BMI): 29.8 Blood Pressure (mmHg): 142/79 Reference Range: 80 - 120 mg / dl Electronic Signature(s) Signed: 02/19/2021 5:16:57 PM By: Fonnie Mu RN Entered By: Fonnie Mu on 02/18/2021 10:05:42

## 2021-02-18 NOTE — Progress Notes (Signed)
Tony Hicks, Tony Hicks ([id number]) Visit Report for 02/18/2021 Chief Complaint Document Details Patient Name: Date of Service: [name], [name] 02/18/2021 9:00 A M Medical Record Number: [id number] Patient Account Number: [id number] Date of Birth/Sex: Treating RN: [birth year] (55 y.o. Tony MichaelsM) Barnhart, Jodi Primary Care Provider: Ricky StabsStephens, Amy Other Clinician: Referring Provider: Treating Provider/Extender: Bonna GainsHoffman, Savannha Welle Stephens, Amy Weeks in Treatment: 2 Information Obtained from: Patient Chief Complaint Left lower extremity wounds Electronic Signature(s) Signed: 02/18/2021 12:26:54 PM By: Geralyn CorwinHoffman, Lakethia Coppess DO Entered By: Geralyn CorwinHoffman, Danyon Mcginness on 02/18/2021 12:14:01 -------------------------------------------------------------------------------- Debridement Details Patient Name: Date of Service: [name], [name] 02/18/2021 9:00 A M Medical Record Number: [id number] Patient Account Number: [id number] Date of Birth/Sex: Treating RN: [birth year] (55 y.o. Tony MichaelsM) Barnhart, Jodi Primary Care Provider: Ricky StabsStephens, Amy Other Clinician: Referring Provider: Treating Provider/Extender: Bonna GainsHoffman, Marian Meneely Stephens, Amy Weeks in Treatment: 2 Debridement Performed for Assessment: Wound #2 Left,Lateral Lower Leg Performed By: Physician Geralyn CorwinHoffman, Katey Barrie, DO Debridement Type: Debridement Severity of Tissue Pre Debridement: Fat layer exposed Level of Consciousness (Pre-procedure): Awake and Alert Pre-procedure Verification/Time Out Yes - 11:15 Taken: Start Time: 11:16 Pain Control: Other : Benzocaine T Area Debrided (L x W): otal 4 (cm) x 3 (cm) = 12 (cm) Tissue and other material debrided: Non-Viable, Eschar Level: Non-Viable Tissue Debridement Description: Selective/Open Wound Instrument: Blade, Forceps, Scissors Bleeding: Minimum Hemostasis Achieved: Pressure End Time: 11:20 Response to Treatment: Procedure was tolerated well Level of Consciousness (Post- Awake and Alert procedure): Post Debridement  Measurements of Total Wound Length: (cm) 4.3 Width: (cm) 4 Depth: (cm) 0.2 Volume: (cm) 2.702 Character of Wound/Ulcer Post Debridement: Stable Severity of Tissue Post Debridement: Fat layer exposed Post Procedure Diagnosis Same as Pre-procedure Electronic Signature(s) Signed: 02/18/2021 12:26:54 PM By: Geralyn CorwinHoffman, Charl Wellen DO Signed: 02/18/2021 5:36:52 PM By: Antonieta IbaBarnhart, Jodi Entered By: Antonieta IbaBarnhart, Jodi on 02/18/2021 11:24:11 -------------------------------------------------------------------------------- HPI Details Patient Name: Date of Service: [name], [name] 02/18/2021 9:00 A M Medical Record Number: [id number] Patient Account Number: [id number] Date of Birth/Sex: Treating RN: [birth year] (55 y.o. Tony MichaelsM) Barnhart, Jodi Primary Care Provider: Ricky StabsStephens, Amy Other Clinician: Referring Provider: Treating Provider/Extender: Bonna GainsHoffman, Ambreen Tufte Stephens, Amy Weeks in Treatment: 2 History of Present Illness HPI Description: Admission 8/1 [name] is a 55 year old male with a past medical history of controlled type 2 diabetes on oral agents, polysubstance abuse (tobacco, alcohol, cocaine) and peripheral arterial disease that presents with 3 left lower extremity wounds. He states that the wound to his heel started out as dried cracking that eventually became infected and worsened. He was following podiatry for this issue. This wound has been ongoing for about 3 months. There are 2 other wounds to his lateral left leg that started out as an injury from hitting his bed post. He states this happened several weeks ago. He reports mild tenderness to the wound beds. He has been using Santyl prescribed to him on discharge from hospital admission. He has no issues or complaints today. He denies signs of infection. 8/15; patient presents for 2-week follow-up. He states he is out of Santyl. He reports following up with vein and vascular. He reports pain to the wound sites although stable. He currently  denies signs of infection. Electronic Signature(s) Signed: 02/18/2021 12:26:54 PM By: Geralyn CorwinHoffman, Sahvanna Mcmanigal DO Entered By: Geralyn CorwinHoffman, Nichael Ehly on 02/18/2021 12:19:39 -------------------------------------------------------------------------------- Physical Exam Details Patient Name: Date of Service: [name], [name] 02/18/2021 9:00 A M Medical Record Number: [id number] Patient Account Number: [id number] Date of Birth/Sex: Treating RN: [birth year] (55 y.o. Tony MichaelsM) Barnhart, Jodi Primary  Care Provider: Ricky Stabs Other Clinician: Referring Provider: Treating Provider/Extender: Bonna Gains, Amy Weeks in Treatment: 2 Constitutional respirations regular, non-labored and within target range for patient.Marland Kitchen Psychiatric pleasant and cooperative. Notes Left lower extremity: 3 wounds present. 2 large open wounds to the lateral aspect. The more proximal wound has nonviable tissue and granulation tissue present. The distal wound has eschar. T his left heel there is a open wound with granulation tissue present. No signs of infection throughout. o Electronic Signature(s) Signed: 02/18/2021 12:26:54 PM By: Geralyn Corwin DO Entered By: Geralyn Corwin on 02/18/2021 12:22:41 -------------------------------------------------------------------------------- Physician Orders Details Patient Name: Date of Service: [name], [name] 02/18/2021 9:00 A M Medical Record Number: [id number] Patient Account Number: [id number] Date of Birth/Sex: Treating RN: 1966/07/02 (55 y.o. Tony Hicks Primary Care Provider: Ricky Stabs Other Clinician: Referring Provider: Treating Provider/Extender: Bonna Gains, Amy Weeks in Treatment: 2 Verbal / Phone Orders: No Diagnosis Coding ICD-10 Coding Code Description S91.302A Unspecified open wound, left foot, initial encounter S81.802A Unspecified open wound, left lower leg, initial encounter I73.9 Peripheral vascular disease, unspecified E11.621  Type 2 diabetes mellitus with foot ulcer Follow-up Appointments ppointment in 1 week. - with Dr. Mikey Bussing Return A Bathing/ Shower/ Hygiene May shower and wash wound with soap and water. - prior to dressing change Edema Control - Lymphedema / SCD / Other Elevate legs to the level of the heart or above for 30 minutes daily and/or when sitting, a frequency of: - throughout the day Avoid standing for long periods of time. Additional Orders / Instructions Follow Nutritious Diet - 100-120g of protein daily. Wound Treatment Wound #1 - Lower Leg Wound Laterality: Left, Lateral, Proximal Cleanser: Soap and Water 1 x Per Day/15 Days Discharge Instructions: May shower and wash wound with dial antibacterial soap and water prior to dressing change. Prim Dressing: Santyl Ointment 1 x Per Day/15 Days ary Discharge Instructions: Apply nickel thick amount to wound bed as instructed Secondary Dressing: Woven Gauze Sponge, Non-Sterile 4x4 in 1 x Per Day/15 Days Discharge Instructions: Apply over primary dressing as directed. Secondary Dressing: ABD Pad, 5x9 1 x Per Day/15 Days Discharge Instructions: Apply over primary dressing as directed. Secured With: [employer], 4.5x3.1 (in/yd) 1 x Per Day/15 Days Discharge Instructions: Secure with Kerlix as directed. Secured With: 76M Medipore H Soft Cloth Surgical Tape, 2x2 (in/yd) 1 x Per Day/15 Days Discharge Instructions: Secure dressing with tape as directed. Wound #2 - Lower Leg Wound Laterality: Left, Lateral Cleanser: Soap and Water 1 x Per Day/15 Days Discharge Instructions: May shower and wash wound with dial antibacterial soap and water prior to dressing change. Prim Dressing: Santyl Ointment 1 x Per Day/15 Days ary Discharge Instructions: Apply nickel thick amount to wound bed as instructed Secondary Dressing: Woven Gauze Sponge, Non-Sterile 4x4 in 1 x Per Day/15 Days Discharge Instructions: Apply over primary dressing as directed. Secondary  Dressing: ABD Pad, 5x9 1 x Per Day/15 Days Discharge Instructions: Apply over primary dressing as directed. Secured With: [employer], 4.5x3.1 (in/yd) 1 x Per Day/15 Days Discharge Instructions: Secure with Kerlix as directed. Secured With: 76M Medipore H Soft Cloth Surgical Tape, 2x2 (in/yd) 1 x Per Day/15 Days Discharge Instructions: Secure dressing with tape as directed. Wound #3 - Calcaneus Wound Laterality: Left Cleanser: Soap and Water 1 x Per Day/15 Days Discharge Instructions: May shower and wash wound with dial antibacterial soap and water prior to dressing change. Prim Dressing: Santyl Ointment 1 x Per Day/15 Days ary  Discharge Instructions: Apply nickel thick amount to wound bed as instructed Secondary Dressing: Woven Gauze Sponge, Non-Sterile 4x4 in 1 x Per Day/15 Days Discharge Instructions: Apply over primary dressing as directed. Secondary Dressing: ABD Pad, 5x9 1 x Per Day/15 Days Discharge Instructions: Apply over primary dressing as directed. Secured With: [employer], 4.5x3.1 (in/yd) 1 x Per Day/15 Days Discharge Instructions: Secure with Kerlix as directed. Secured With: 77M Medipore H Soft Cloth Surgical Tape, 2x2 (in/yd) 1 x Per Day/15 Days Discharge Instructions: Secure dressing with tape as directed. Consults Pain Clinic - Peripheral Arterial Disease with Open Wounds - (ICD10 S81.802A - Unspecified open wound, left lower leg, initial encounter) Patient Medications llergies: No Known Drug Allergies A Notifications Medication Indication Start End 02/18/2021 Santyl DOSE 1 - topical 250 unit/gram ointment - 1 application daily Electronic Signature(s) Signed: 02/18/2021 12:26:54 PM By: Geralyn Corwin DO Entered By: Geralyn Corwin on 02/18/2021 12:23:01 Prescription 02/18/2021 -------------------------------------------------------------------------------- Santiago Bur DO Patient Name: Provider: 05-27-1966 [id number] Date of  Birth: NPI#Judie Petit [id number] Sex: DEA #: [phone] Phone #: License #: Eligha Bridegroom [facility] Patient Address: [address] [place], [place] [address] [address] [place], [place] [address] [phone] Allergies No Known Drug Allergies Provider's Orders Pain Clinic - ICD10: S81.802A - Peripheral Arterial Disease with Open Wounds Hand Signature: Date(s): Prescription 02/18/2021 Santiago Bur DO Patient Name: Provider: 10/08/1965 [id number] Date of Birth: NPI#: Judie Petit [id number] Sex: DEA #: [phone] Phone #: License #: Eligha Bridegroom [facility] Patient Address: [address] [place], [place] [address] [address] [place], [place] [address] [phone] Allergies No Known Drug Allergies Medication Medication: Route: Strength: Form: Santyl 250 unit/gram topical ointment topical 250 unit/gram ointment Class: TOPICAL/MUCOUS MEMBR./SUBCUT ENZYMES . Dose: Frequency / Time: Indication: 1 1 application daily Number of Refills: Number of Units: 2 Thirty (30) Generic Substitution: Start Date: End Date: One Time Use: Substitution Permitted 02/18/2021 No Note to Pharmacy: Hand Signature: Date(s): Electronic Signature(s) Signed: 02/18/2021 12:26:54 PM By: Geralyn Corwin DO Entered By: Geralyn Corwin on 02/18/2021 12:23:02 -------------------------------------------------------------------------------- Problem List Details Patient Name: Date of Service: [name], [name] 02/18/2021 9:00 A M Medical Record Number: [id number] Patient Account Number: [id number] Date of Birth/Sex: Treating RN: [birth year] (55 y.o. Tony Hicks Primary Care Provider: Ricky Stabs Other Clinician: Referring Provider: Treating Provider/Extender: Bonna Gains, Amy Weeks in Treatment: 2 Active Problems ICD-10 Encounter Code Description Active  Date MDM Diagnosis S91.302A Unspecified open wound, left foot, initial encounter 02/04/2021 No Yes S81.802A Unspecified open wound, left lower leg, initial encounter 02/04/2021 No Yes I73.9 Peripheral vascular disease, unspecified 02/04/2021 No Yes E11.621 Type 2 diabetes mellitus with foot ulcer 02/04/2021 No Yes Inactive Problems Resolved Problems Electronic Signature(s) Signed: 02/18/2021 12:26:54 PM By: Geralyn Corwin DO Previous Signature: 02/18/2021 10:01:16 AM Version By: Antonieta Iba Entered By: Geralyn Corwin on 02/18/2021 12:13:42 -------------------------------------------------------------------------------- Progress Note Details Patient Name: Date of Service: [name], [name] 02/18/2021 9:00 A M Medical Record Number: [id number] Patient Account Number: [id number] Date of Birth/Sex: Treating RN: 1966-01-13 (55 y.o. Tony Hicks Primary Care Provider: Ricky Stabs Other Clinician: Referring Provider: Treating Provider/Extender: Bonna Gains, Amy Weeks in Treatment: 2 Subjective Chief Complaint Information obtained from Patient Left lower extremity wounds History of Present Illness (HPI) Admission 8/1 [name] is a 55 year old male with a past medical history of controlled type 2 diabetes on oral  agents, polysubstance abuse (tobacco, alcohol, cocaine) and peripheral arterial disease that presents with 3 left lower extremity wounds. He states that the wound to his heel started out as dried cracking that eventually became infected and worsened. He was following podiatry for this issue. This wound has been ongoing for about 3 months. There are 2 other wounds to his lateral left leg that started out as an injury from hitting his bed post. He states this happened several weeks ago. He reports mild tenderness to the wound beds. He has been using Santyl prescribed to him on discharge from hospital admission. He has no issues or complaints today. He denies  signs of infection. 8/15; patient presents for 2-week follow-up. He states he is out of Santyl. He reports following up with vein and vascular. He reports pain to the wound sites although stable. He currently denies signs of infection. Patient History Information obtained from Patient. Family History Unknown History. Social History Current every day smoker - 1 cigarette per day, Marital Status - Single, Alcohol Use - Moderate - Beer, Drug Use - Prior History - Cocaine, Caffeine Use - Rarely. Medical History Cardiovascular Patient has history of Peripheral Arterial Disease Endocrine Patient has history of Type II Diabetes Neurologic Patient has history of Neuropathy Objective Constitutional respirations regular, non-labored and within target range for patient.. Vitals Time Taken: 10:05 AM, Height: 67 in, Weight: 190 lbs, BMI: 29.8, Temperature: 98.7 F, Pulse: 69 bpm, Respiratory Rate: 17 breaths/min, Blood Pressure: 142/79 mmHg. Psychiatric pleasant and cooperative. General Notes: Left lower extremity: 3 wounds present. 2 large open wounds to the lateral aspect. The more proximal wound has nonviable tissue and granulation tissue present. The distal wound has eschar. T his left heel there is a open wound with granulation tissue present. No signs of infection throughout. o Integumentary (Hair, Skin) Wound #1 status is Open. Original cause of wound was Gradually Appeared. The date acquired was: 11/04/2020. The wound has been in treatment 2 weeks. The wound is located on the Left,Proximal,Lateral Lower Leg. The wound measures 4.8cm length x 4.2cm width x 0.1cm depth; 15.834cm^2 area and 1.583cm^3 volume. There is Fat Layer (Subcutaneous Tissue) exposed. There is no tunneling or undermining noted. There is a medium amount of serosanguineous drainage noted. The wound margin is flat and intact. There is medium (34-66%) red granulation within the wound bed. There is a medium (34-66%) amount  of necrotic tissue within the wound bed including Adherent Slough. Wound #2 status is Open. Original cause of wound was Gradually Appeared. The date acquired was: 11/04/2020. The wound has been in treatment 2 weeks. The wound is located on the Left,Lateral Lower Leg. The wound measures 4.3cm length x 4cm width x 0.2cm depth; 13.509cm^2 area and 2.702cm^3 volume. There is Fat Layer (Subcutaneous Tissue) exposed. There is no tunneling or undermining noted. There is a medium amount of serosanguineous drainage noted. The wound margin is flat and intact. There is no granulation within the wound bed. There is a large (67-100%) amount of necrotic tissue within the wound bed including Eschar and Adherent Slough. Wound #3 status is Open. Original cause of wound was Gradually Appeared. The date acquired was: 11/04/2020. The wound has been in treatment 2 weeks. The wound is located on the Left Calcaneus. The wound measures 4.5cm length x 4cm width x 0.2cm depth; 14.137cm^2 area and 2.827cm^3 volume. There is Fat Layer (Subcutaneous Tissue) exposed. There is no tunneling or undermining noted. There is a medium amount of serosanguineous drainage noted. The wound  margin is flat and intact. There is medium (34-66%) pink granulation within the wound bed. There is a medium (34-66%) amount of necrotic tissue within the wound bed including Eschar and Adherent Slough. Assessment Active Problems ICD-10 Unspecified open wound, left foot, initial encounter Unspecified open wound, left lower leg, initial encounter Peripheral vascular disease, unspecified Type 2 diabetes mellitus with foot ulcer Patient has 3 wounds present. 2 of the 3 wounds are stable and I was able to debride nonviable tissue. 1 wound has become larger. He states that he put Santyl around the intact skin and this broke it down. No signs of infection on exam. For now I recommended continuing Santyl to the wound beds only and I gave him a prescription for  this with a coupon. If he is unable to obtain supplies will likely need to wrap his leg and have this changed weekly. He is elevated vascular on 8/10 and plan is for LLE bypass duplex and ABIs in 2 to 3 weeks. He is also reporting chronic pain to the wound beds. At this time we will refer to pain management. 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 There was a Selective/Open Wound Non-Viable Tissue Debridement with a total area of 12 sq cm performed by Geralyn Corwin, DO. With the following instrument(s): Blade, Forceps, and Scissors to remove Non-Viable tissue/material. Material removed includes Eschar after achieving pain control using Other (Benzocaine). No specimens were taken. A time out was conducted at 11:15, prior to the start of the procedure. A Minimum amount of bleeding was controlled with Pressure. The procedure was tolerated well. Post Debridement Measurements: 4.3cm length x 4cm width x 0.2cm depth; 2.702cm^3 volume. Character of Wound/Ulcer Post Debridement is stable. Severity of Tissue Post Debridement is: Fat layer exposed. Post procedure Diagnosis Wound #2: Same as Pre-Procedure Plan Follow-up Appointments: Return Appointment in 1 week. - with Dr. Mikey Bussing Bathing/ Shower/ Hygiene: May shower and wash wound with soap and water. - prior to dressing change Edema Control - Lymphedema / SCD / Other: Elevate legs to the level of the heart or above for 30 minutes daily and/or when sitting, a frequency of: - throughout the day Avoid standing for long periods of time. Additional Orders / Instructions: Follow Nutritious Diet - 100-120g of protein daily. Consults ordered were: Pain Clinic - Peripheral Arterial Disease with Open Wounds The following medication(s) was prescribed: Santyl topical 250 unit/gram ointment 1 1 application daily  starting 02/18/2021 WOUND #1: - Lower Leg Wound Laterality: Left, Lateral, Proximal Cleanser: Soap and Water 1 x Per Day/15 Days Discharge Instructions: May shower and wash wound with dial antibacterial soap and water prior to dressing change. Prim Dressing: Santyl Ointment 1 x Per Day/15 Days ary Discharge Instructions: Apply nickel thick amount to wound bed as instructed Secondary Dressing: Woven Gauze Sponge, Non-Sterile 4x4 in 1 x Per Day/15 Days Discharge Instructions: Apply over primary dressing as directed. Secondary Dressing: ABD Pad, 5x9 1 x Per Day/15 Days Discharge Instructions: Apply over primary dressing as directed. Secured With: [employer], 4.5x3.1 (in/yd) 1 x Per Day/15 Days Discharge Instructions: Secure with Kerlix as directed. Secured With: 52M Medipore H Soft Cloth Surgical T ape, 2x2 (in/yd) 1 x Per Day/15 Days Discharge Instructions: Secure dressing with tape as directed. WOUND #2: - Lower Leg Wound Laterality: Left, Lateral Cleanser: Soap and Water 1 x Per Day/15 Days Discharge Instructions:  May shower and wash wound with dial antibacterial soap and water prior to dressing change. Prim Dressing: Santyl Ointment 1 x Per Day/15 Days ary Discharge Instructions: Apply nickel thick amount to wound bed as instructed Secondary Dressing: Woven Gauze Sponge, Non-Sterile 4x4 in 1 x Per Day/15 Days Discharge Instructions: Apply over primary dressing as directed. Secondary Dressing: ABD Pad, 5x9 1 x Per Day/15 Days Discharge Instructions: Apply over primary dressing as directed. Secured With: [employer], 4.5x3.1 (in/yd) 1 x Per Day/15 Days Discharge Instructions: Secure with Kerlix as directed. Secured With: 518M Medipore H Soft Cloth Surgical T ape, 2x2 (in/yd) 1 x Per Day/15 Days Discharge Instructions: Secure dressing with tape as directed. WOUND #3: - Calcaneus Wound Laterality: Left Cleanser: Soap and Water 1 x Per Day/15 Days Discharge Instructions: May  shower and wash wound with dial antibacterial soap and water prior to dressing change. Prim Dressing: Santyl Ointment 1 x Per Day/15 Days ary Discharge Instructions: Apply nickel thick amount to wound bed as instructed Secondary Dressing: Woven Gauze Sponge, Non-Sterile 4x4 in 1 x Per Day/15 Days Discharge Instructions: Apply over primary dressing as directed. Secondary Dressing: ABD Pad, 5x9 1 x Per Day/15 Days Discharge Instructions: Apply over primary dressing as directed. Secured With: [employer], 4.5x3.1 (in/yd) 1 x Per Day/15 Days Discharge Instructions: Secure with Kerlix as directed. Secured With: 518M Medipore H Soft Cloth Surgical T ape, 2x2 (in/yd) 1 x Per Day/15 Days Discharge Instructions: Secure dressing with tape as directed. 1. In office sharp debridement 2. Continue Santyl to the wound beds 3. Pain management referral 4. Supplies ordered for dressing change Electronic Signature(s) Signed: 02/18/2021 12:26:54 PM By: Geralyn Corwin DO Entered By: Geralyn Corwin on 02/18/2021 12:26:15 -------------------------------------------------------------------------------- HxROS Details Patient Name: Date of Service: [name], [name] 02/18/2021 9:00 A M Medical Record Number: [id number] Patient Account Number: [id number] Date of Birth/Sex: Treating RN: 1965/07/29 (55 y.o. Tony Hicks Primary Care Provider: Ricky Stabs Other Clinician: Referring Provider: Treating Provider/Extender: Bonna Gains, Amy Weeks in Treatment: 2 Information Obtained From Patient Cardiovascular Medical History: Positive for: Peripheral Arterial Disease Endocrine Medical History: Positive for: Type II Diabetes Time with diabetes: 1 year Treated with: Oral agents Blood sugar tested every day: No Neurologic Medical History: Positive for: Neuropathy Immunizations Pneumococcal Vaccine: Received Pneumococcal Vaccination: No Implantable Devices None Family and  Social History Unknown History: Yes; Current every day smoker - 1 cigarette per day; Marital Status - Single; Alcohol Use: Moderate - Beer; Drug Use: Prior History - Cocaine; Caffeine Use: Rarely; Financial Concerns: No; Food, Clothing or Shelter Needs: No; Support System Lacking: No; Transportation Concerns: No Electronic Signature(s) Signed: 02/18/2021 12:26:54 PM By: Geralyn Corwin DO Signed: 02/18/2021 5:36:52 PM By: Antonieta Iba Entered By: Geralyn Corwin on 02/18/2021 12:19:54 -------------------------------------------------------------------------------- SuperBill Details Patient Name: Date of Service: [name], [name] 02/18/2021 Medical Record Number: [id number] Patient Account Number: [id number] Date of Birth/Sex: Treating RN: Jan 21, 1966 (55 y.o. Tony Hicks Primary Care Provider: Ricky Stabs Other Clinician: Referring Provider: Treating Provider/Extender: Bonna Gains, Amy Weeks in Treatment: 2 Diagnosis Coding ICD-10 Codes Code Description [phone] Unspecified open wound, left foot, initial encounter S81.802A Unspecified open wound, left lower leg, initial encounter I73.9 Peripheral vascular disease, unspecified E11.621 Type 2 diabetes mellitus with foot ulcer Facility Procedures CPT4 Code: [id number] Description: [phone] - DEBRIDE WOUND 1ST 20 SQ CM OR < ICD-10 Diagnosis Description S81.802A Unspecified open wound, left lower leg, initial encounter Modifier: Quantity: 1 Physician Procedures : MWN0  Code Description Modifier [id number] - WC PHYS DEBR WO ANESTH 20 SQ CM ICD-10 Diagnosis Description S81.802A Unspecified open wound, left lower leg, initial encounter Quantity: 1 Electronic Signature(s) Signed: 02/18/2021 12:26:54 PM By: Geralyn Corwin DO Entered By: Geralyn Corwin on 02/18/2021 12:26:26

## 2021-02-20 ENCOUNTER — Telehealth: Payer: Self-pay | Admitting: Family

## 2021-02-25 ENCOUNTER — Telehealth: Payer: Self-pay | Admitting: Family

## 2021-02-25 ENCOUNTER — Other Ambulatory Visit: Payer: Self-pay

## 2021-02-25 ENCOUNTER — Encounter (HOSPITAL_BASED_OUTPATIENT_CLINIC_OR_DEPARTMENT_OTHER): Payer: 59 | Admitting: Internal Medicine

## 2021-02-25 DIAGNOSIS — S91302D Unspecified open wound, left foot, subsequent encounter: Secondary | ICD-10-CM

## 2021-02-25 DIAGNOSIS — S81802D Unspecified open wound, left lower leg, subsequent encounter: Secondary | ICD-10-CM | POA: Diagnosis not present

## 2021-02-25 DIAGNOSIS — E11621 Type 2 diabetes mellitus with foot ulcer: Secondary | ICD-10-CM

## 2021-02-25 DIAGNOSIS — I739 Peripheral vascular disease, unspecified: Secondary | ICD-10-CM

## 2021-02-25 NOTE — Progress Notes (Signed)
ABIR, CRAINE ([id number]) Visit Report for 02/25/2021 Arrival Information Details Patient Name: Date of Service: Tony Hicks 02/25/2021 12:30 PM Medical Record Number: [id number] Patient Account Number: [id number] Date of Birth/Sex: Treating RN: 1966/05/24 (55 y.o. [name] Primary Care Detrich Rakestraw: Zonia Kief, [place] Other Clinician: Referring Tameia Rafferty: Treating Roniqua Kintz/Extender: Bonna Gains, Amy Weeks in Treatment: 3 Visit Information History Since Last Visit Added or deleted any medications: No Patient Arrived: Crutches Any new allergies or adverse reactions: No Arrival Time: 12:37 Had a fall or experienced change in No Accompanied By: self activities of daily living that may affect Transfer Assistance: None risk of falls: Patient Identification Verified: Yes Signs or symptoms of abuse/neglect since last visito No Secondary Verification Process Completed: Yes Hospitalized since last visit: No Patient Requires Transmission-Based Precautions: No Implantable device outside of the clinic excluding No Patient Has Alerts: Yes cellular tissue based products placed in the center Patient Alerts: L ABI: 0.36 TBI: 0 since last visit: Has Dressing in Place as Prescribed: Yes Pain Present Now: Yes Electronic Signature(s) Signed: 02/25/2021 5:14:01 PM By: Zenaida Deed RN, BSN Entered By: Zenaida Deed on 02/25/2021 12:44:21 -------------------------------------------------------------------------------- Encounter Discharge Information Details Patient Name: Date of Service: Tony Hicks, Tony Hicks 02/25/2021 12:30 PM Medical Record Number: [id number] Patient Account Number: [id number] Date of Birth/Sex: Treating RN: [birth year] (55 y.o. [name] Primary Care Domenik Trice: Ricky Stabs Other Clinician: Referring Danija Gosa: Treating Darnise Montag/Extender: Bonna Gains, Amy Weeks in Treatment: 3 Encounter Discharge Information Items Post Procedure  Vitals Discharge Condition: Stable Temperature (F): 98.7 Ambulatory Status: Crutches Pulse (bpm): 92 Discharge Destination: Home Respiratory Rate (breaths/min): 18 Transportation: Other Blood Pressure (mmHg): 148/84 Accompanied By: self Schedule Follow-up Appointment: Yes Clinical Summary of Care: Patient Declined Notes [facility] transportation Electronic Signature(s) Signed: 02/25/2021 5:14:01 PM By: Zenaida Deed RN, BSN Entered By: Zenaida Deed on 02/25/2021 13:22:26 -------------------------------------------------------------------------------- Lower Extremity Assessment Details Patient Name: Date of Service: Tony Hicks, Tony Hicks 02/25/2021 12:30 PM Medical Record Number: [id number] Patient Account Number: [id number] Date of Birth/Sex: Treating RN: 07-02-1966 (55 y.o. [name] Primary Care Veronnica Hennings: Ricky Stabs Other Clinician: Referring Brodyn Depuy: Treating Keary Hanak/Extender: Bonna Gains, Amy Weeks in Treatment: 3 Edema Assessment Assessed: [Left: No] [Right: No] Edema: [Left: Ye] [Right: s] Calf Left: Right: Point of Measurement: 34 cm From Medial Instep 38.4 cm Ankle Left: Right: Point of Measurement: 11 cm From Medial Instep 23 cm Vascular Assessment Pulses: Dorsalis Pedis Palpable: [Left:Yes] Electronic Signature(s) Signed: 02/25/2021 5:14:01 PM By: Zenaida Deed RN, BSN Entered By: Zenaida Deed on 02/25/2021 12:46:26 -------------------------------------------------------------------------------- Multi Wound Chart Details Patient Name: Date of Service: Tony Hicks, Tony Hicks 02/25/2021 12:30 PM Medical Record Number: [id number] Patient Account Number: [id number] Date of Birth/Sex: Treating RN: 1966-05-25 (55 y.o. Lytle Michaels Primary Care Tinika Bucknam: Ricky Stabs Other Clinician: Referring Isaiahs Chancy: Treating Sola Margolis/Extender: Bonna Gains, Amy Weeks in Treatment: 3 Vital Signs Height(in): 67 Pulse(bpm):  92 Weight(lbs): 190 Blood Pressure(mmHg): 148/84 Body Mass Index(BMI): 30 Temperature(F): 98.7 Respiratory Rate(breaths/min): 18 Photos: [1:Left, Proximal, Lateral Lower Leg] [2:Left, Lateral Lower Leg] [3:Left Calcaneus] Wound Location: [1:Gradually Appeared] [2:Gradually Appeared] [3:Gradually Appeared] Wounding Event: [1:Arterial Insufficiency Ulcer] [2:Arterial Insufficiency Ulcer] [3:Diabetic Wound/Ulcer of the Lower] Primary Etiology: [1:N/A] [2:N/A] [3:Extremity Arterial Insufficiency Ulcer] Secondary Etiology: [1:Peripheral Arterial Disease, Type II] [2:Peripheral Arterial Disease, Type II Peripheral Arterial Disease, Type II] Comorbid History: [1:Diabetes, Neuropathy 11/04/2020] [2:Diabetes, Neuropathy 11/04/2020] [3:Diabetes, Neuropathy 11/04/2020] Date Acquired: [1:3] [2:3] [3:3] Weeks of Treatment: [1:Open] [2:Open] [3:Open] Wound Status: [1:Yes] [2:No] [  3:No] Clustered Wound: [1:2] [2:N/A] [3:N/A] Clustered Quantity: [1:0.5x0.6x0.1] [2:3.5x3.4x0.1] [3:3.7x3.5x0.1] Measurements L x W x D (cm) [1:0.236] [2:9.346] [3:10.171] A (cm) : rea [1:0.024] [2:0.935] [3:1.017] Volume (cm) : [1:79.10%] [2:27.70%] [3:47.10%] % Reduction in A [1:rea: 78.80%] [2:27.60%] [3:82.40%] % Reduction in Volume: [1:Full Thickness Without Exposed] [2:Full Thickness Without Exposed] [3:Grade 2] Classification: [1:Support Structures None Present] [2:Support Structures Medium] [3:Medium] Exudate A mount: [1:N/A] [2:Serosanguineous] [3:Serosanguineous] Exudate Type: [1:N/A] [2:red, brown] [3:red, brown] Exudate Color: [1:Flat and Intact] [2:Flat and Intact] [3:Flat and Intact] Wound Margin: [1:None Present (0%)] [2:Small (1-33%)] [3:Large (67-100%)] Granulation A mount: [1:N/A] [2:Red] [3:Red, Pink] Granulation Quality: [1:None Present (0%)] [2:Large (67-100%)] [3:Small (1-33%)] Necrotic A mount: [1:Fascia: No] [2:Fat Layer (Subcutaneous Tissue): Yes Fat Layer (Subcutaneous Tissue): Yes] Exposed  Structures: [1:Fat Layer (Subcutaneous Tissue): No Tendon: No Muscle: No Joint: No Bone: No Limited to Skin Breakdown Large (67-100%)] [2:Fascia: No Tendon: No Muscle: No Joint: No Bone: No None] [3:Fascia: No Tendon: No Muscle: No Joint: No Bone: No Small (1-33%)] Epithelialization: [1:N/A] [2:Chemical/Enzymatic/Mechanical] [3:Chemical/Enzymatic/Mechanical] Debridement: [1:N/A] [2:N/A] [3:N/A] Instrument: [1:N/A] [2:None] [3:None] Bleeding: Debridement Treatment Response: N/A [2:Procedure was tolerated well] [3:Procedure was tolerated well] Post Debridement Measurements L x N/A [2:3.5x3.4x0.1] [3:3.7x3.5x0.1] W x D (cm) [1:N/A] [2:0.935] [3:1.017] Post Debridement Volume: (cm) [1:scabbed] [2:N/A] [3:N/A] Assessment Notes: [1:N/A] [2:Debridement] [3:Debridement] Treatment Notes Wound #1 (Lower Leg) Wound Laterality: Left, Lateral, Proximal Cleanser Soap and Water Discharge Instruction: May shower and wash wound with dial antibacterial soap and water prior to dressing change. Peri-Wound Care Topical Primary Dressing Santyl Ointment Discharge Instruction: Apply nickel thick amount to wound bed as instructed Secondary Dressing Woven Gauze Sponge, Non-Sterile 4x4 in Discharge Instruction: Apply over primary dressing as directed. ABD Pad, 5x9 Discharge Instruction: Apply over primary dressing as directed. Secured With [employer], 4.5x3.1 (in/yd) Discharge Instruction: Secure with Kerlix as directed. 7M Medipore H Soft Cloth Surgical T ape, 2x2 (in/yd) Discharge Instruction: Secure dressing with tape as directed. Compression Wrap Compression Stockings Add-Ons Wound #2 (Lower Leg) Wound Laterality: Left, Lateral Cleanser Soap and Water Discharge Instruction: May shower and wash wound with dial antibacterial soap and water prior to dressing change. Peri-Wound Care Topical Primary Dressing Santyl Ointment Discharge Instruction: Apply nickel thick amount to wound bed as  instructed Secondary Dressing Woven Gauze Sponge, Non-Sterile 4x4 in Discharge Instruction: Apply over primary dressing as directed. ABD Pad, 5x9 Discharge Instruction: Apply over primary dressing as directed. Secured With [employer], 4.5x3.1 (in/yd) Discharge Instruction: Secure with Kerlix as directed. 7M Medipore H Soft Cloth Surgical T ape, 2x2 (in/yd) Discharge Instruction: Secure dressing with tape as directed. Compression Wrap Compression Stockings Add-Ons Wound #3 (Calcaneus) Wound Laterality: Left Cleanser Soap and Water Discharge Instruction: May shower and wash wound with dial antibacterial soap and water prior to dressing change. Peri-Wound Care Topical Primary Dressing Santyl Ointment Discharge Instruction: Apply nickel thick amount to wound bed as instructed Secondary Dressing Woven Gauze Sponge, Non-Sterile 4x4 in Discharge Instruction: Apply over primary dressing as directed. ABD Pad, 5x9 Discharge Instruction: Apply over primary dressing as directed. Secured With [employer], 4.5x3.1 (in/yd) Discharge Instruction: Secure with Kerlix as directed. 7M Medipore H Soft Cloth Surgical T ape, 2x2 (in/yd) Discharge Instruction: Secure dressing with tape as directed. Compression Wrap Compression Stockings Add-Ons Electronic Signature(s) Signed: 02/25/2021 1:32:30 PM By: Geralyn Corwin DO Signed: 02/25/2021 5:26:48 PM By: Antonieta Iba Entered By: Geralyn Corwin on 02/25/2021 13:22:22 -------------------------------------------------------------------------------- Multi-Disciplinary Care Plan Details Patient Name: Date of Service: Tony Hicks, Tony Hicks  02/25/2021 12:30 PM Medical Record Number: [id number] Patient Account Number: [id number] Date of Birth/Sex: Treating RN: 10-May-1966 (55 y.o. [name] Primary Care Tamiyah Moulin: Ricky Stabs Other Clinician: Referring Orrin Yurkovich: Treating Monterius Rolf/Extender: Caesar Chestnut Weeks in Treatment: 3 Multidisciplinary Care Plan reviewed with physician Active Inactive Nutrition Nursing Diagnoses: Impaired glucose control: actual or potential Potential for alteratiion in Nutrition/Potential for imbalanced nutrition Goals: Patient/caregiver agrees to and verbalizes understanding of need to use nutritional supplements and/or vitamins as prescribed Date Initiated: 02/04/2021 Target Resolution Date: 03/08/2021 Goal Status: Active Patient/caregiver will maintain therapeutic glucose control Date Initiated: 02/04/2021 Target Resolution Date: 03/08/2021 Goal Status: Active Interventions: Assess HgA1c results as ordered upon admission and as needed Assess patient nutrition upon admission and as needed per policy Provide education on elevated blood sugars and impact on wound healing Provide education on nutrition Treatment Activities: Education provided on Nutrition : 02/04/2021 Notes: Wound/Skin Impairment Nursing Diagnoses: Impaired tissue integrity Knowledge deficit related to smoking impact on wound healing Knowledge deficit related to ulceration/compromised skin integrity Goals: Patient will demonstrate a reduced rate of smoking or cessation of smoking Date Initiated: 02/04/2021 Target Resolution Date: 03/08/2021 Goal Status: Active Patient/caregiver will verbalize understanding of skin care regimen Date Initiated: 02/04/2021 Target Resolution Date: 03/08/2021 Goal Status: Active Interventions: Assess patient/caregiver ability to obtain necessary supplies Assess patient/caregiver ability to perform ulcer/skin care regimen upon admission and as needed Assess ulceration(s) every visit Provide education on smoking Provide education on ulcer and skin care Notes: Electronic Signature(s) Signed: 02/25/2021 5:14:01 PM By: Zenaida Deed RN, BSN Entered By: Zenaida Deed on 02/25/2021  13:04:57 -------------------------------------------------------------------------------- Pain Assessment Details Patient Name: Date of Service: Tony Hicks, Tony Hicks 02/25/2021 12:30 PM Medical Record Number: [id number] Patient Account Number: [id number] Date of Birth/Sex: Treating RN: 01/20/1966 (56 y.o. [name] Primary Care Tanmay Halteman: Ricky Stabs Other Clinician: Referring Marzell Isakson: Treating Abisai Deer/Extender: Bonna Gains, Amy Weeks in Treatment: 3 Active Problems Location of Pain Severity and Description of Pain Patient Has Paino Yes Site Locations Pain Location: Pain in Ulcers With Dressing Change: Yes Duration of the Pain. Constant / Intermittento Constant Rate the pain. Current Pain Level: 9 Worst Pain Level: 10 Least Pain Level: 6 Character of Pain Describe the Pain: Aching, Throbbing Pain Management and Medication Current Pain Management: Medication: Yes Is the Current Pain Management Adequate: Inadequate How does your wound impact your activities of daily livingo Sleep: Yes Bathing: No Appetite: No Relationship With Others: No Bladder Continence: No Emotions: No Bowel Continence: No Drive: No Toileting: No Hobbies: No Dressing: No Electronic Signature(s) Signed: 02/25/2021 5:14:01 PM By: Zenaida Deed RN, BSN Entered By: Zenaida Deed on 02/25/2021 12:45:47 -------------------------------------------------------------------------------- Patient/Caregiver Education Details Patient Name: Date of Service: Tony Hicks 8/22/2022andnbsp12:30 PM Medical Record Number: [id number] Patient Account Number: [id number] Date of Birth/Gender: Treating RN: 12-07-1965 (55 y.o. [name] Primary Care Physician: Ricky Stabs Other Clinician: Referring Physician: Treating Physician/Extender: Cheri Rous in Treatment: 3 Education Assessment Education Provided To: Patient Education Topics  Provided Tissue Oxygenation: Methods: Explain/Verbal Responses: Reinforcements needed, State content correctly Wound/Skin Impairment: Methods: Explain/Verbal Responses: Reinforcements needed, State content correctly Electronic Signature(s) Signed: 02/25/2021 5:14:01 PM By: Zenaida Deed RN, BSN Entered By: Zenaida Deed on 02/25/2021 13:05:28 -------------------------------------------------------------------------------- Wound Assessment Details Patient Name: Date of Service: Tony Hicks, Tony Hicks 02/25/2021 12:30 PM Medical Record Number: [id number] Patient Account Number: [id number] Date of Birth/Sex: Treating RN: [birth year] (55 y.o. [name] Primary Care Diasia Henken: Ricky Stabs Other  Clinician: Referring Bruchy Mikel: Treating Jasim Harari/Extender: Bonna Gains, Amy Weeks in Treatment: 3 Wound Status Wound Number: 1 Primary Etiology: Arterial Insufficiency Ulcer Wound Location: Left, Proximal, Lateral Lower Leg Wound Status: Open Wounding Event: Gradually Appeared Comorbid History: Peripheral Arterial Disease, Type II Diabetes, Neuropathy Date Acquired: 11/04/2020 Weeks Of Treatment: 3 Clustered Wound: Yes Photos Wound Measurements Length: (cm) 0 Width: (cm) 0 Depth: (cm) 0 Clustered Quantity: 2 Area: (cm) Volume: (cm) .5 % Reduction in Area: 79.1% .6 % Reduction in Volume: 78.8% .1 Epithelialization: Large (67-100%) Tunneling: No 0.236 Undermining: No 0.024 Wound Description Classification: Full Thickness Without Exposed Support Structures Wound Margin: Flat and Intact Exudate Amount: None Present Foul Odor After Cleansing: No Slough/Fibrino No Wound Bed Granulation Amount: None Present (0%) Exposed Structure Necrotic Amount: None Present (0%) Fascia Exposed: No Fat Layer (Subcutaneous Tissue) Exposed: No Tendon Exposed: No Muscle Exposed: No Joint Exposed: No Bone Exposed: No Limited to Skin Breakdown Assessment  Notes scabbed Treatment Notes Wound #1 (Lower Leg) Wound Laterality: Left, Lateral, Proximal Cleanser Soap and Water Discharge Instruction: May shower and wash wound with dial antibacterial soap and water prior to dressing change. Peri-Wound Care Topical Primary Dressing Santyl Ointment Discharge Instruction: Apply nickel thick amount to wound bed as instructed Secondary Dressing Woven Gauze Sponge, Non-Sterile 4x4 in Discharge Instruction: Apply over primary dressing as directed. ABD Pad, 5x9 Discharge Instruction: Apply over primary dressing as directed. Secured With [employer], 4.5x3.1 (in/yd) Discharge Instruction: Secure with Kerlix as directed. 70M Medipore H Soft Cloth Surgical T ape, 2x2 (in/yd) Discharge Instruction: Secure dressing with tape as directed. Compression Wrap Compression Stockings Add-Ons Electronic Signature(s) Signed: 02/25/2021 5:14:01 PM By: Zenaida Deed RN, BSN Entered By: Zenaida Deed on 02/25/2021 12:55:08 -------------------------------------------------------------------------------- Wound Assessment Details Patient Name: Date of Service: Tony Hicks, Tony Hicks 02/25/2021 12:30 PM Medical Record Number: [id number] Patient Account Number: [id number] Date of Birth/Sex: Treating RN: [birth year] (55 y.o. [name] Primary Care Teretha Chalupa: Ricky Stabs Other Clinician: Referring Zamirah Denny: Treating Maryetta Shafer/Extender: Bonna Gains, Amy Weeks in Treatment: 3 Wound Status Wound Number: 2 Primary Etiology: Arterial Insufficiency Ulcer Wound Location: Left, Lateral Lower Leg Wound Status: Open Wounding Event: Gradually Appeared Comorbid History: Peripheral Arterial Disease, Type II Diabetes, Neuropathy Date Acquired: 11/04/2020 Weeks Of Treatment: 3 Clustered Wound: No Photos Wound Measurements Length: (cm) 3.5 Width: (cm) 3.4 Depth: (cm) 0.1 Area: (cm) 9.346 Volume: (cm) 0.935 % Reduction in Area: 27.7% %  Reduction in Volume: 27.6% Epithelialization: None Tunneling: No Undermining: No Wound Description Classification: Full Thickness Without Exposed Support Structu Wound Margin: Flat and Intact Exudate Amount: Medium Exudate Type: Serosanguineous Exudate Color: red, brown res Foul Odor After Cleansing: No Slough/Fibrino Yes Wound Bed Granulation Amount: Small (1-33%) Exposed Structure Granulation Quality: Red Fascia Exposed: No Necrotic Amount: Large (67-100%) Fat Layer (Subcutaneous Tissue) Exposed: Yes Necrotic Quality: Adherent Slough Tendon Exposed: No Muscle Exposed: No Joint Exposed: No Bone Exposed: No Treatment Notes Wound #2 (Lower Leg) Wound Laterality: Left, Lateral Cleanser Soap and Water Discharge Instruction: May shower and wash wound with dial antibacterial soap and water prior to dressing change. Peri-Wound Care Topical Primary Dressing Santyl Ointment Discharge Instruction: Apply nickel thick amount to wound bed as instructed Secondary Dressing Woven Gauze Sponge, Non-Sterile 4x4 in Discharge Instruction: Apply over primary dressing as directed. ABD Pad, 5x9 Discharge Instruction: Apply over primary dressing as directed. Secured With [employer], 4.5x3.1 (in/yd) Discharge Instruction: Secure with Kerlix as directed. 70M Medipore H Soft Cloth Surgical T ape, 2x2 (  in/yd) Discharge Instruction: Secure dressing with tape as directed. Compression Wrap Compression Stockings Add-Ons Electronic Signature(s) Signed: 02/25/2021 5:14:01 PM By: Zenaida DeedBoehlein, Linda RN, BSN Entered By: Zenaida DeedBoehlein, Linda on 02/25/2021 12:55:48 -------------------------------------------------------------------------------- Wound Assessment Details Patient Name: Date of Service: Tony Hicks, Tony Hicks 02/25/2021 12:30 PM Medical Record Number: [id number] Patient Account Number: [id number] Date of Birth/Sex: Treating RN: 03-27-1966 (55 y.o. Damaris SchoonerM) Boehlein, Linda Primary Care Kori Colin:  Ricky StabsStephens, Amy Other Clinician: Referring Casimir Barcellos: Treating Raeya Merritts/Extender: Bonna GainsHoffman, Jessica Stephens, Amy Weeks in Treatment: 3 Wound Status Wound Number: 3 Primary Etiology: Diabetic Wound/Ulcer of the Lower Extremity Wound Location: Left Calcaneus Secondary Etiology: Arterial Insufficiency Ulcer Wounding Event: Gradually Appeared Wound Status: Open Date Acquired: 11/04/2020 Comorbid History: Peripheral Arterial Disease, Type II Diabetes, Neuropathy Weeks Of Treatment: 3 Clustered Wound: No Photos Wound Measurements Length: (cm) 3.7 Width: (cm) 3.5 Depth: (cm) 0.1 Area: (cm) 10.171 Volume: (cm) 1.017 % Reduction in Area: 47.1% % Reduction in Volume: 82.4% Epithelialization: Small (1-33%) Tunneling: No Undermining: No Wound Description Classification: Grade 2 Wound Margin: Flat and Intact Exudate Amount: Medium Exudate Type: Serosanguineous Exudate Color: red, brown Foul Odor After Cleansing: No Slough/Fibrino No Wound Bed Granulation Amount: Large (67-100%) Exposed Structure Granulation Quality: Red, Pink Fascia Exposed: No Necrotic Amount: Small (1-33%) Fat Layer (Subcutaneous Tissue) Exposed: Yes Necrotic Quality: Adherent Slough Tendon Exposed: No Muscle Exposed: No Joint Exposed: No Bone Exposed: No Treatment Notes Wound #3 (Calcaneus) Wound Laterality: Left Cleanser Soap and Water Discharge Instruction: May shower and wash wound with dial antibacterial soap and water prior to dressing change. Peri-Wound Care Topical Primary Dressing Santyl Ointment Discharge Instruction: Apply nickel thick amount to wound bed as instructed Secondary Dressing Woven Gauze Sponge, Non-Sterile 4x4 in Discharge Instruction: Apply over primary dressing as directed. ABD Pad, 5x9 Discharge Instruction: Apply over primary dressing as directed. Secured With [employer] Roll Sterile, 4.5x3.1 (in/yd) Discharge Instruction: Secure with Kerlix as directed. 49M Medipore H Soft  Cloth Surgical T ape, 2x2 (in/yd) Discharge Instruction: Secure dressing with tape as directed. Compression Wrap Compression Stockings Add-Ons Electronic Signature(s) Signed: 02/25/2021 5:14:01 PM By: Zenaida DeedBoehlein, Linda RN, BSN Entered By: Zenaida DeedBoehlein, Linda on 02/25/2021 12:56:41 -------------------------------------------------------------------------------- Vitals Details Patient Name: Date of Service: Tony Hicks, Tony Hicks 02/25/2021 12:30 PM Medical Record Number: [id number] Patient Account Number: [id number] Date of Birth/Sex: Treating RN: 03-27-1966 (55 y.o. Damaris SchoonerM) Boehlein, Linda Primary Care Niles Ess: Ricky StabsStephens, Amy Other Clinician: Referring Katerra Ingman: Treating Deicy Rusk/Extender: Bonna GainsHoffman, Jessica Stephens, Amy Weeks in Treatment: 3 Vital Signs Time Taken: 12:44 Temperature (F): 98.7 Height (in): 67 Pulse (bpm): 92 Source: Stated Respiratory Rate (breaths/min): 18 Weight (lbs): 190 Blood Pressure (mmHg): 148/84 Source: Stated Reference Range: 80 - 120 mg / dl Body Mass Index (BMI): 29.8 Electronic Signature(s) Signed: 02/25/2021 5:14:01 PM By: Zenaida DeedBoehlein, Linda RN, BSN Entered By: Zenaida DeedBoehlein, Linda on 02/25/2021 12:44:54

## 2021-02-25 NOTE — Progress Notes (Signed)
Tony Hicks, Tony Hicks ([id number]) Visit Report for 02/25/2021 Chief Complaint Document Details Patient Name: Date of Service: Tony Hicks [name] 02/25/2021 12:30 PM Medical Record Number: [id number] Patient Account Number: [id number] Date of Birth/Sex: Treating RN: [birth year] (55 y.o. Tony Hicks Primary Care Provider: Ricky Hicks Other Clinician: Referring Provider: Treating Provider/Extender: Tony Hicks, Tony Hicks in Treatment: 3 Information Obtained from: Patient Chief Complaint Left lower extremity wounds Electronic Signature(s) Signed: 02/25/2021 1:32:30 PM By: Geralyn Corwin DO Entered By: Geralyn Corwin on 02/25/2021 13:22:33 -------------------------------------------------------------------------------- Debridement Details Patient Name: Date of Service: Tony Hicks, Tony Hicks 02/25/2021 12:30 PM Medical Record Number: [id number] Patient Account Number: [id number] Date of Birth/Sex: Treating RN: July 11, 1965 (55 y.o. Tony Hicks Primary Care Provider: Ricky Hicks Other Clinician: Referring Provider: Treating Provider/Extender: Tony Hicks, Tony Hicks in Treatment: 3 Debridement Performed for Assessment: Wound #2 Left,Lateral Lower Leg Performed By: Clinician Antonieta Iba, RN Debridement Type: Chemical/Enzymatic/Mechanical Agent Used: Santyl Severity of Tissue Pre Debridement: Fat layer exposed Level of Consciousness (Pre-procedure): Awake and Alert Pre-procedure Verification/Time Out No Taken: Bleeding: None Response to Treatment: Procedure was tolerated well Level of Consciousness (Post- Awake and Alert procedure): Post Debridement Measurements of Total Wound Length: (cm) 3.5 Width: (cm) 3.4 Depth: (cm) 0.1 Volume: (cm) 0.935 Character of Wound/Ulcer Post Debridement: Improved Severity of Tissue Post Debridement: Fat layer exposed Post Procedure Diagnosis Same as Pre-procedure Electronic Signature(s) Signed: 02/25/2021 1:32:30 PM  By: Geralyn Corwin DO Signed: 02/25/2021 5:14:01 PM By: Zenaida Deed RN, BSN Entered By: Zenaida Deed on 02/25/2021 13:10:03 -------------------------------------------------------------------------------- Debridement Details Patient Name: Date of Service: Tony Hicks, Tony Hicks 02/25/2021 12:30 PM Medical Record Number: [id number] Patient Account Number: [id number] Date of Birth/Sex: Treating RN: May 08, 1966 (55 y.o. Tony Hicks Primary Care Provider: Ricky Hicks Other Clinician: Referring Provider: Treating Provider/Extender: Tony Hicks, Tony Hicks in Treatment: 3 Debridement Performed for Assessment: Wound #3 Left Calcaneus Performed By: Clinician Antonieta Iba, RN Debridement Type: Chemical/Enzymatic/Mechanical Agent Used: Santyl Severity of Tissue Pre Debridement: Fat layer exposed Level of Consciousness (Pre-procedure): Awake and Alert Pre-procedure Verification/Time Out No Taken: Bleeding: None Response to Treatment: Procedure was tolerated well Level of Consciousness (Post- Awake and Alert procedure): Post Debridement Measurements of Total Wound Length: (cm) 3.7 Width: (cm) 3.5 Depth: (cm) 0.1 Volume: (cm) 1.017 Character of Wound/Ulcer Post Debridement: Improved Severity of Tissue Post Debridement: Fat layer exposed Post Procedure Diagnosis Same as Pre-procedure Electronic Signature(s) Signed: 02/25/2021 1:32:30 PM By: Geralyn Corwin DO Signed: 02/25/2021 5:14:01 PM By: Zenaida Deed RN, BSN Entered By: Zenaida Deed on 02/25/2021 13:10:40 -------------------------------------------------------------------------------- HPI Details Patient Name: Date of Service: Tony Hicks, Tony Hicks 02/25/2021 12:30 PM Medical Record Number: [id number] Patient Account Number: [id number] Date of Birth/Sex: Treating RN: 04-26-1966 (55 y.o. Tony Hicks Primary Care Provider: Ricky Hicks Other Clinician: Referring Provider: Treating  Provider/Extender: Tony Hicks, Tony Hicks in Treatment: 3 History of Present Illness HPI Description: Admission 8/1 Tony Hicks is a 55 year old male with a past medical history of controlled type 2 diabetes on oral agents, polysubstance abuse (tobacco, alcohol, cocaine) and peripheral arterial disease that presents with 3 left lower extremity wounds. He states that the wound to his heel started out as dried cracking that eventually became infected and worsened. He was following podiatry for this issue. This wound has been ongoing for about 3 months. There are 2 other wounds to his lateral left leg that started out as an injury from hitting his bed post. He states this  happened several Hicks ago. He reports mild tenderness to the wound beds. He has been using Santyl prescribed to him on discharge from hospital admission. He has no issues or complaints today. He denies signs of infection. 8/15; patient presents for 2-week follow-up. He states he is out of Santyl. He reports following up with vein and vascular. He reports pain to the wound sites although stable. He currently denies signs of infection. 8/22; patient presents for 1 week follow-up. He states he has been using Santyl daily to the wound beds. He continues to report pain throughout the wound sites. He denies increased warmth erythema or purulent drainage. Unfortunately patient was denied for care at the pain management clinic. He states he is following up with his primary care provider in the next few days. Electronic Signature(s) Signed: 02/25/2021 1:32:30 PM By: Geralyn Corwin DO Entered By: Geralyn Corwin on 02/25/2021 13:24:03 -------------------------------------------------------------------------------- Physical Exam Details Patient Name: Date of Service: Tony Hicks, Tony Hicks 02/25/2021 12:30 PM Medical Record Number: [id number] Patient Account Number: [id number] Date of Birth/Sex: Treating RN: 1966/02/24 (56  y.o. Tony Hicks Primary Care Provider: Ricky Hicks Other Clinician: Referring Provider: Treating Provider/Extender: Tony Hicks, Tony Hicks in Treatment: 3 Constitutional respirations regular, non-labored and within target range for patient.Marland Kitchen Psychiatric pleasant and cooperative. Notes Left lower extremity: 3 wounds present. All wounds with granulation tissue present and scant nonviable tissue present. The most proximal wound is almost healed. No signs of infection on exam. Electronic Signature(s) Signed: 02/25/2021 1:32:30 PM By: Geralyn Corwin DO Entered By: Geralyn Corwin on 02/25/2021 13:25:44 -------------------------------------------------------------------------------- Physician Orders Details Patient Name: Date of Service: Tony Hicks, Tony Hicks 02/25/2021 12:30 PM Medical Record Number: [id number] Patient Account Number: [id number] Date of Birth/Sex: Treating RN: [birth year] (55 y.o. Tony Hicks Primary Care Provider: Zonia Kief, [place] Other Clinician: Referring Provider: Treating Provider/Extender: Tony Hicks, Tony Hicks in Treatment: 3 Verbal / Phone Orders: No Diagnosis Coding ICD-10 Coding Code Description S91.302D Unspecified open wound, left foot, subsequent encounter S81.802D Unspecified open wound, left lower leg, subsequent encounter I73.9 Peripheral vascular disease, unspecified E11.621 Type 2 diabetes mellitus with foot ulcer Follow-up Appointments ppointment in 2 Hicks. - with Dr. Mikey Bussing Return A Bathing/ Shower/ Hygiene May shower and wash wound with soap and water. - prior to dressing change Edema Control - Lymphedema / SCD / Other Elevate legs to the level of the heart or above for 30 minutes daily and/or when sitting, a frequency of: - throughout the day Avoid standing for long periods of time. Additional Orders / Instructions Follow Nutritious Diet - 100-120g of protein daily. Wound Treatment Wound #1 - Lower  Leg Wound Laterality: Left, Lateral, Proximal Cleanser: Soap and Water 1 x Per Day/15 Days Discharge Instructions: May shower and wash wound with dial antibacterial soap and water prior to dressing change. Prim Dressing: Santyl Ointment 1 x Per Day/15 Days ary Discharge Instructions: Apply nickel thick amount to wound bed as instructed Secondary Dressing: Woven Gauze Sponge, Non-Sterile 4x4 in 1 x Per Day/15 Days Discharge Instructions: Apply over primary dressing as directed. Secondary Dressing: ABD Pad, 5x9 1 x Per Day/15 Days Discharge Instructions: Apply over primary dressing as directed. Secured With: [employer], 4.5x3.1 (in/yd) 1 x Per Day/15 Days Discharge Instructions: Secure with Kerlix as directed. Secured With: 31M Medipore H Soft Cloth Surgical Tape, 2x2 (in/yd) 1 x Per Day/15 Days Discharge Instructions: Secure dressing with tape as directed. Wound #2 - Lower Leg Wound Laterality: Left, Lateral Cleanser: Soap  and Water 1 x Per Day/15 Days Discharge Instructions: May shower and wash wound with dial antibacterial soap and water prior to dressing change. Prim Dressing: Santyl Ointment 1 x Per Day/15 Days ary Discharge Instructions: Apply nickel thick amount to wound bed as instructed Secondary Dressing: Woven Gauze Sponge, Non-Sterile 4x4 in 1 x Per Day/15 Days Discharge Instructions: Apply over primary dressing as directed. Secondary Dressing: ABD Pad, 5x9 1 x Per Day/15 Days Discharge Instructions: Apply over primary dressing as directed. Secured With: [employer], 4.5x3.1 (in/yd) 1 x Per Day/15 Days Discharge Instructions: Secure with Kerlix as directed. Secured With: 36M Medipore H Soft Cloth Surgical Tape, 2x2 (in/yd) 1 x Per Day/15 Days Discharge Instructions: Secure dressing with tape as directed. Wound #3 - Calcaneus Wound Laterality: Left Cleanser: Soap and Water 1 x Per Day/15 Days Discharge Instructions: May shower and wash wound with dial  antibacterial soap and water prior to dressing change. Prim Dressing: Santyl Ointment 1 x Per Day/15 Days ary Discharge Instructions: Apply nickel thick amount to wound bed as instructed Secondary Dressing: Woven Gauze Sponge, Non-Sterile 4x4 in 1 x Per Day/15 Days Discharge Instructions: Apply over primary dressing as directed. Secondary Dressing: ABD Pad, 5x9 1 x Per Day/15 Days Discharge Instructions: Apply over primary dressing as directed. Secured With: [employer], 4.5x3.1 (in/yd) 1 x Per Day/15 Days Discharge Instructions: Secure with Kerlix as directed. Secured With: 36M Medipore H Soft Cloth Surgical Tape, 2x2 (in/yd) 1 x Per Day/15 Days Discharge Instructions: Secure dressing with tape as directed. Electronic Signature(s) Signed: 02/25/2021 1:32:30 PM By: Geralyn Corwin DO Entered By: Geralyn Corwin on 02/25/2021 13:30:33 -------------------------------------------------------------------------------- Problem List Details Patient Name: Date of Service: Tony Hicks, Tony Hicks 02/25/2021 12:30 PM Medical Record Number: [id number] Patient Account Number: [id number] Date of Birth/Sex: Treating RN: 1966/03/07 (55 y.o. Tony Hicks Primary Care Provider: Ricky Hicks Other Clinician: Referring Provider: Treating Provider/Extender: Tony Hicks, Tony Hicks in Treatment: 3 Active Problems ICD-10 Encounter Code Description Active Date MDM Diagnosis S91.302D Unspecified open wound, left foot, subsequent encounter 02/25/2021 No Yes S81.802D Unspecified open wound, left lower leg, subsequent encounter 02/25/2021 No Yes I73.9 Peripheral vascular disease, unspecified 02/04/2021 No Yes E11.621 Type 2 diabetes mellitus with foot ulcer 02/04/2021 No Yes Inactive Problems ICD-10 Code Description Active Date Inactive Date S91.302A Unspecified open wound, left foot, initial encounter 02/04/2021 02/04/2021 S81.802A Unspecified open wound, left lower leg, initial encounter  02/04/2021 02/04/2021 Resolved Problems Electronic Signature(s) Signed: 02/25/2021 1:32:30 PM By: Geralyn Corwin DO Entered By: Geralyn Corwin on 02/25/2021 13:22:13 -------------------------------------------------------------------------------- Progress Note Details Patient Name: Date of Service: Tony Hicks, Tony Hicks 02/25/2021 12:30 PM Medical Record Number: [id number] Patient Account Number: [id number] Date of Birth/Sex: Treating RN: [birth year] (55 y.o. Tony Hicks Primary Care Provider: Ricky Hicks Other Clinician: Referring Provider: Treating Provider/Extender: Tony Hicks, Tony Hicks in Treatment: 3 Subjective Chief Complaint Information obtained from Patient Left lower extremity wounds History of Present Illness (HPI) Admission 8/1 [name] is a 54 year old male with a past medical history of controlled type 2 diabetes on oral agents, polysubstance abuse (tobacco, alcohol, cocaine) and peripheral arterial disease that presents with 3 left lower extremity wounds. He states that the wound to his heel started out as dried cracking that eventually became infected and worsened. He was following podiatry for this issue. This wound has been ongoing for about 3 months. There are 2 other wounds to his lateral left leg that started out as an injury from  hitting his bed post. He states this happened several Hicks ago. He reports mild tenderness to the wound beds. He has been using Santyl prescribed to him on discharge from hospital admission. He has no issues or complaints today. He denies signs of infection. 8/15; patient presents for 2-week follow-up. He states he is out of Santyl. He reports following up with vein and vascular. He reports pain to the wound sites although stable. He currently denies signs of infection. 8/22; patient presents for 1 week follow-up. He states he has been using Santyl daily to the wound beds. He continues to report pain throughout  the wound sites. He denies increased warmth erythema or purulent drainage. Unfortunately patient was denied for care at the pain management clinic. He states he is following up with his primary care provider in the next few days. Patient History Information obtained from Patient. Family History Unknown History. Social History Current every day smoker - 1 cigarette per day, Marital Status - Single, Alcohol Use - Moderate - Beer, Drug Use - Prior History - Cocaine, Caffeine Use - Rarely. Medical History Cardiovascular Patient has history of Peripheral Arterial Disease Endocrine Patient has history of Type II Diabetes Neurologic Patient has history of Neuropathy Objective Constitutional respirations regular, non-labored and within target range for patient.. Vitals Time Taken: 12:44 PM, Height: 67 in, Source: Stated, Weight: 190 lbs, Source: Stated, BMI: 29.8, Temperature: 98.7 F, Pulse: 92 bpm, Respiratory Rate: 18 breaths/min, Blood Pressure: 148/84 mmHg. Psychiatric pleasant and cooperative. General Notes: Left lower extremity: 3 wounds present. All wounds with granulation tissue present and scant nonviable tissue present. The most proximal wound is almost healed. No signs of infection on exam. Integumentary (Hair, Skin) Wound #1 status is Open. Original cause of wound was Gradually Appeared. The date acquired was: 11/04/2020. The wound has been in treatment 3 Hicks. The wound is located on the Left,Proximal,Lateral Lower Leg. The wound measures 0.5cm length x 0.6cm width x 0.1cm depth; 0.236cm^2 area and 0.024cm^3 volume. The wound is limited to skin breakdown. There is no tunneling or undermining noted. There is a none present amount of drainage noted. The wound margin is flat and intact. There is no granulation within the wound bed. There is no necrotic tissue within the wound bed. General Notes: scabbed Wound #2 status is Open. Original cause of wound was Gradually Appeared. The  date acquired was: 11/04/2020. The wound has been in treatment 3 Hicks. The wound is located on the Left,Lateral Lower Leg. The wound measures 3.5cm length x 3.4cm width x 0.1cm depth; 9.346cm^2 area and 0.935cm^3 volume. There is Fat Layer (Subcutaneous Tissue) exposed. There is no tunneling or undermining noted. There is a medium amount of serosanguineous drainage noted. The wound margin is flat and intact. There is small (1-33%) red granulation within the wound bed. There is a large (67-100%) amount of necrotic tissue within the wound bed including Adherent Slough. Wound #3 status is Open. Original cause of wound was Gradually Appeared. The date acquired was: 11/04/2020. The wound has been in treatment 3 Hicks. The wound is located on the Left Calcaneus. The wound measures 3.7cm length x 3.5cm width x 0.1cm depth; 10.171cm^2 area and 1.017cm^3 volume. There is Fat Layer (Subcutaneous Tissue) exposed. There is no tunneling or undermining noted. There is a medium amount of serosanguineous drainage noted. The wound margin is flat and intact. There is large (67-100%) red, pink granulation within the wound bed. There is a small (1-33%) amount of necrotic tissue within the wound  bed including Adherent Slough. Assessment Active Problems ICD-10 Unspecified open wound, left foot, subsequent encounter Unspecified open wound, left lower leg, subsequent encounter Peripheral vascular disease, unspecified Type 2 diabetes mellitus with foot ulcer Patient's wounds have shown signs of improvement since last clinic visit. The surfaces are cleaner. One of the 3 wounds is almost closed. He has 2 large remaining wounds. At this time I recommended continuing Santyl. There are no signs of infection on exam. Unfortunately pain management would not accept the patient. They recommended a behavioral health referral per nursing report. At this time patient will follow-up with primary care physician this week and may be best  managed by them for this chronic issue. 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 There was a Chemical/Enzymatic/Mechanical debridement performed by Antonieta IbaBarnhart, Jodi, RN.Marland Kitchen. Agent used was [employer]. There was no bleeding. The procedure was tolerated well. Post Debridement Measurements: 3.5cm length x 3.4cm width x 0.1cm depth; 0.935cm^3 volume. Character of Wound/Ulcer Post Debridement is improved. Severity of Tissue Post Debridement is: Fat layer exposed. Post procedure Diagnosis Wound #2: Same as Pre-Procedure Wound #3 Pre-procedure diagnosis of Wound #3 is a Diabetic Wound/Ulcer of the Lower Extremity located on the Left Calcaneus .Severity of Tissue Pre Debridement is: Fat layer exposed. There was a Chemical/Enzymatic/Mechanical debridement performed by Antonieta IbaBarnhart, Jodi, RN.Marland Kitchen. Agent used was [employer]antyl. There was no bleeding. The procedure was tolerated well. Post Debridement Measurements: 3.7cm length x 3.5cm width x 0.1cm depth; 1.017cm^3 volume. Character of Wound/Ulcer Post Debridement is improved. Severity of Tissue Post Debridement is: Fat layer exposed. Post procedure Diagnosis Wound #3: Same as Pre-Procedure Plan Follow-up Appointments: Return Appointment in 2 Hicks. - with Dr. Mikey BussingHoffman Bathing/ Shower/ Hygiene: May shower and wash wound with soap and water. - prior to dressing change Edema Control - Lymphedema / SCD / Other: Elevate legs to the level of the heart or above for 30 minutes daily and/or when sitting, a frequency of: - throughout the day Avoid standing for long periods of time. Additional Orders / Instructions: Follow Nutritious Diet - 100-120g of protein daily. WOUND #1: - Lower Leg Wound Laterality: Left, Lateral, Proximal Cleanser: Soap and Water 1 x Per Day/15 Days Discharge Instructions: May shower and wash wound with dial  antibacterial soap and water prior to dressing change. Prim Dressing: Santyl Ointment 1 x Per Day/15 Days ary Discharge Instructions: Apply nickel thick amount to wound bed as instructed Secondary Dressing: Woven Gauze Sponge, Non-Sterile 4x4 in 1 x Per Day/15 Days Discharge Instructions: Apply over primary dressing as directed. Secondary Dressing: ABD Pad, 5x9 1 x Per Day/15 Days Discharge Instructions: Apply over primary dressing as directed. Secured With: [employer] Roll Sterile, 4.5x3.1 (in/yd) 1 x Per Day/15 Days Discharge Instructions: Secure with Kerlix as directed. Secured With: 44M Medipore H Soft Cloth Surgical T ape, 2x2 (in/yd) 1 x Per Day/15 Days Discharge Instructions: Secure dressing with tape as directed. WOUND #2: - Lower Leg Wound Laterality: Left, Lateral Cleanser: Soap and Water 1 x Per Day/15 Days Discharge Instructions: May shower and wash wound with dial antibacterial soap and water prior to dressing change. Prim Dressing: Santyl Ointment 1 x Per Day/15 Days ary Discharge Instructions: Apply nickel thick amount to wound bed as instructed Secondary Dressing: Woven Gauze Sponge, Non-Sterile 4x4 in 1 x Per Day/15 Days Discharge Instructions: Apply over primary dressing as directed. Secondary Dressing: ABD Pad, 5x9 1  x Per Day/15 Days Discharge Instructions: Apply over primary dressing as directed. Secured With: [employer], 4.5x3.1 (in/yd) 1 x Per Day/15 Days Discharge Instructions: Secure with Kerlix as directed. Secured With: 5M Medipore H Soft Cloth Surgical T ape, 2x2 (in/yd) 1 x Per Day/15 Days Discharge Instructions: Secure dressing with tape as directed. WOUND #3: - Calcaneus Wound Laterality: Left Cleanser: Soap and Water 1 x Per Day/15 Days Discharge Instructions: May shower and wash wound with dial antibacterial soap and water prior to dressing change. Prim Dressing: Santyl Ointment 1 x Per Day/15 Days ary Discharge Instructions: Apply nickel thick  amount to wound bed as instructed Secondary Dressing: Woven Gauze Sponge, Non-Sterile 4x4 in 1 x Per Day/15 Days Discharge Instructions: Apply over primary dressing as directed. Secondary Dressing: ABD Pad, 5x9 1 x Per Day/15 Days Discharge Instructions: Apply over primary dressing as directed. Secured With: [employer], 4.5x3.1 (in/yd) 1 x Per Day/15 Days Discharge Instructions: Secure with Kerlix as directed. Secured With: 5M Medipore H Soft Cloth Surgical T ape, 2x2 (in/yd) 1 x Per Day/15 Days Discharge Instructions: Secure dressing with tape as directed. 1. Continue Santyl 2. Follow-up in 2 Hicks Electronic Signature(s) Signed: 02/25/2021 1:32:30 PM By: Geralyn Corwin DO Entered By: Geralyn Corwin on 02/25/2021 13:31:46 -------------------------------------------------------------------------------- HxROS Details Patient Name: Date of Service: Tony Hicks, Tony Hicks 02/25/2021 12:30 PM Medical Record Number: [id number] Patient Account Number: [id number] Date of Birth/Sex: Treating RN: [birth year] (55 y.o. Tony Hicks Primary Care Provider: Ricky Hicks Other Clinician: Referring Provider: Treating Provider/Extender: Tony Hicks, Tony Hicks in Treatment: 3 Information Obtained From Patient Cardiovascular Medical History: Positive for: Peripheral Arterial Disease Endocrine Medical History: Positive for: Type II Diabetes Time with diabetes: 1 year Treated with: Oral agents Blood sugar tested every day: No Neurologic Medical History: Positive for: Neuropathy Immunizations Pneumococcal Vaccine: Received Pneumococcal Vaccination: No Implantable Devices None Family and Social History Unknown History: Yes; Current every day smoker - 1 cigarette per day; Marital Status - Single; Alcohol Use: Moderate - Beer; Drug Use: Prior History - Cocaine; Caffeine Use: Rarely; Financial Concerns: No; Food, Clothing or Shelter Needs: No; Support System Lacking: No;  Transportation Concerns: No Electronic Signature(s) Signed: 02/25/2021 1:32:30 PM By: Geralyn Corwin DO Signed: 02/25/2021 5:26:48 PM By: Antonieta Iba Entered By: Geralyn Corwin on 02/25/2021 13:24:13 -------------------------------------------------------------------------------- SuperBill Details Patient Name: Date of Service: Tony Hicks, Tony Hicks 02/25/2021 Medical Record Number: [id number] Patient Account Number: [id number] Date of Birth/Sex: Treating RN: Feb 03, 1966 (55 y.o. Tony Hicks Primary Care Provider: Ricky Hicks Other Clinician: Referring Provider: Treating Provider/Extender: Tony Hicks, Tony Hicks in Treatment: 3 Diagnosis Coding ICD-10 Codes Code Description S91.302D Unspecified open wound, left foot, subsequent encounter S81.802D Unspecified open wound, left lower leg, subsequent encounter I73.9 Peripheral vascular disease, unspecified E11.621 Type 2 diabetes mellitus with foot ulcer Facility Procedures CPT4 Code: [id number] Description: [id number] - DEBRIDE W/O ANES NON SELECT Modifier: Quantity: 1 Physician Procedures : CPT4 Code Description Modifier [id number] - WC PHYS LEVEL 3 - EST PT ICD-10 Diagnosis Description S91.302D Unspecified open wound, left foot, subsequent encounter S81.802D Unspecified open wound, left lower leg, subsequent encounter I73.9 Peripheral  vascular disease, unspecified E11.621 Type 2 diabetes mellitus with foot ulcer Quantity: 1 Electronic Signature(s) Signed: 02/25/2021 1:32:30 PM By: Geralyn Corwin DO Entered By: Geralyn Corwin on 02/25/2021 13:32:00

## 2021-02-25 NOTE — Telephone Encounter (Signed)
Patient called saying that the ibuprofen is not working. Patient would like to know if he can be prescribed something else.

## 2021-03-08 ENCOUNTER — Other Ambulatory Visit: Payer: Self-pay

## 2021-03-08 ENCOUNTER — Other Ambulatory Visit (HOSPITAL_COMMUNITY): Payer: Self-pay

## 2021-03-08 ENCOUNTER — Ambulatory Visit: Payer: Self-pay

## 2021-03-08 ENCOUNTER — Ambulatory Visit (INDEPENDENT_AMBULATORY_CARE_PROVIDER_SITE_OTHER)
Admission: RE | Admit: 2021-03-08 | Discharge: 2021-03-08 | Disposition: A | Discharge status: Home / Self Care | Payer: Self-pay | Source: Ambulatory Visit | Attending: Physician Assistant | Admitting: Physician Assistant

## 2021-03-08 ENCOUNTER — Ambulatory Visit (INDEPENDENT_AMBULATORY_CARE_PROVIDER_SITE_OTHER): Payer: Self-pay | Admitting: Physician Assistant

## 2021-03-08 ENCOUNTER — Ambulatory Visit (HOSPITAL_COMMUNITY)
Admission: RE | Admit: 2021-03-08 | Discharge: 2021-03-08 | Disposition: A | Discharge status: Home / Self Care | Payer: Self-pay | Source: Ambulatory Visit | Attending: Vascular Surgery | Admitting: Vascular Surgery

## 2021-03-08 VITALS — BP 104/66 | HR 92 | Temp 97.7°F | Resp 20 | Ht 66.0 in | Wt 182.0 lb

## 2021-03-08 DIAGNOSIS — I739 Peripheral vascular disease, unspecified: Secondary | ICD-10-CM

## 2021-03-08 NOTE — Progress Notes (Signed)
POST OPERATIVE OFFICE NOTE    CC:  F/u for surgery  HPI:  This is a 55 y.o. male who is s/p 1.Harvest of left leg great saphenous vein 2.  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 Chestine Spore on 01/14/21. He tolerated the surgery well and did well post operatively. He was discharge POD# 4. He presents for follow-up with non-invasive studies.   He says his incisions have healed well. He is still having pain in area of non healing wound on left leg/ foot. He has been following up with wound care center for management of his leg wounds. His next appointment with them is on 02/18/21. He denies any claudication, rest pain or new wounds. He is ambulating with crutches  He has not been compliant with aspirin, statin.  No Known Allergies  Current Outpatient Medications  Medication Sig Dispense Refill   aspirin 81 MG EC tablet Take 1 tablet (81 mg total) by mouth daily. Swallow whole. (Patient not taking: Reported on 03/08/2021) 30 tablet 11   atorvastatin (LIPITOR) 40 MG tablet Take 1 tablet (40 mg total) by mouth daily. 30 tablet 3   bacitracin ointment Apply 1 application topically 2 (two) times daily. (Patient not taking: Reported on 03/08/2021) 120 g 0   collagenase (SANTYL) ointment Apply topically daily. Apply to left ankle wound nickel thick, followed by wet to dry dressing. (Patient not taking: Reported on 03/08/2021) 15 g 0   ferrous sulfate 325 (65 FE) MG tablet Take 1 tablet (325 mg total) by mouth daily with breakfast. (Patient not taking: Reported on 03/08/2021) 120 tablet 0   gabapentin (NEURONTIN) 300 MG capsule Take 1 capsule (300 mg total) by mouth 2 (two) times daily. (Patient not taking: Reported on 03/08/2021) 180 capsule 0   ibuprofen (ADVIL) 600 MG tablet Take 1 tablet (600 mg total) by mouth every 8 (eight) hours as needed. (Patient not taking: Reported on 03/08/2021) 30 tablet 0   metFORMIN (GLUCOPHAGE)  500 MG tablet Take 1 tablet (500 mg total) by mouth daily with breakfast. (Patient not taking: Reported on 03/08/2021) 90 tablet 0   nicotine (NICODERM CQ - DOSED IN MG/24 HOURS) 14 mg/24hr patch Place 1 patch (14 mg total) onto the skin daily. (Patient not taking: Reported on 03/08/2021) 28 patch 0   pantoprazole (PROTONIX) 40 MG tablet Take 1 tablet (40 mg total) by mouth daily. (Patient not taking: Reported on 03/08/2021) 90 tablet 0   No current facility-administered medications for this visit.     ROS:  See HPI  BP 104/66 (BP Location: Left Arm, Patient Position: Sitting, Cuff Size: Normal)   Pulse 92   Temp 97.7 F (36.5 C)   Resp 20   Ht 5\' 6"  (1.676 m)   Wt 182 lb (82.6 kg)   SpO2 99%   BMI 29.38 kg/m   Physical Exam:  General appearance: Awake, alert in no apparent distress Cardiac: Heart rate and rhythm are regular Respirations: Nonlabored Incisions: Left groin, thigh and lower leg incisions are all well approximated without bleeding or hematoma. Extremities: Moderate left lower leg and foot edema. Both feet are warm with intact sensation and motor function.   Pulse/Doppler exam:  Brisk dorsalis pedis, posterior tibial and peroneal artery Doppler signals      03/08/2021 ABI/TBIToday's ABIToday's TBIPrevious ABIPrevious TBI  +-------+-----------+-----------+------------+------------+  Right  1.15       0.17                                 +-------+-----------+-----------+------------+------------+  Left   1.15       0.20                                 +-------+-----------+-----------+------------+------------+   Arterial wall calcification precludes accurate ankle pressures and ABIs.     Summary:  Right: Resting right ankle-brachial index is within normal range. No  evidence of significant right lower extremity arterial disease. The right  toe-brachial index is abnormal.  Although ankle brachial indices are within normal limits (0.95-1.29),  arterial  Doppler waveforms at the ankle suggest some component of arterial  occlusive disease.  Left: Resting left ankle-brachial index is within normal range. No  evidence of significant left lower extremity arterial disease. The left  toe-brachial index is abnormal.  Although ankle brachial indices are within normal limits (0.95-1.29),  arterial Doppler waveforms at the ankle suggest some component of arterial  occlusive disease.    *See table(s) above for measurements and observations.    Preliminary    LLE arterial duplex Summary:  Left: Patent left above knee to posterior tibial artery graft with no  evidence of stenosis.     See table(s) above for measurements and observations.    Preliminary    Assessment/Plan:  This is a 55 y.o. male who is s/p: left above knee pop to PT artery bypass with vein 2 months ago. Patent bypass. Wounds improving. LLE edema.  I advised him to expect left lower extremity edema as long as he is ambulating throughout the day.  Encouraged him to elevate his left leg is much as possible in the proper position.  This will enhance wound healing.  If edema persists once his wounds are healed, would advise him to wear knee-high compression sock.  Counseled regarding smoking cessation. Counseled regarding daily aspirin and statin use. Counseled regarding good glucose control, monitor blood pressure and follow-up with primary care physician. Counseled regarding routine follow-up.  We will plan follow-up in 7 months with ABIs and left lower extremity arterial duplex. Advised him to proceed to the emergency department should he develop cold painful foot.  Wendi Maya, PA-C [facility] [phone]  Clinic MD:  Dr. Edilia Bo on-call

## 2021-03-12 ENCOUNTER — Encounter (HOSPITAL_BASED_OUTPATIENT_CLINIC_OR_DEPARTMENT_OTHER): Payer: 59 | Attending: Internal Medicine | Admitting: Internal Medicine

## 2021-03-12 ENCOUNTER — Other Ambulatory Visit: Payer: Self-pay

## 2021-03-12 DIAGNOSIS — D6861 Antiphospholipid syndrome: Secondary | ICD-10-CM | POA: Diagnosis not present

## 2021-03-12 DIAGNOSIS — F17210 Nicotine dependence, cigarettes, uncomplicated: Secondary | ICD-10-CM | POA: Insufficient documentation

## 2021-03-12 DIAGNOSIS — E11621 Type 2 diabetes mellitus with foot ulcer: Secondary | ICD-10-CM | POA: Insufficient documentation

## 2021-03-12 DIAGNOSIS — E1151 Type 2 diabetes mellitus with diabetic peripheral angiopathy without gangrene: Secondary | ICD-10-CM | POA: Insufficient documentation

## 2021-03-12 DIAGNOSIS — E1140 Type 2 diabetes mellitus with diabetic neuropathy, unspecified: Secondary | ICD-10-CM | POA: Insufficient documentation

## 2021-03-12 DIAGNOSIS — W2203XA Walked into furniture, initial encounter: Secondary | ICD-10-CM | POA: Insufficient documentation

## 2021-03-12 DIAGNOSIS — I87312 Chronic venous hypertension (idiopathic) with ulcer of left lower extremity: Secondary | ICD-10-CM

## 2021-03-12 DIAGNOSIS — L97818 Non-pressure chronic ulcer of other part of right lower leg with other specified severity: Secondary | ICD-10-CM | POA: Diagnosis not present

## 2021-03-13 ENCOUNTER — Ambulatory Visit: Payer: Self-pay | Admitting: Family

## 2021-03-13 ENCOUNTER — Other Ambulatory Visit: Payer: Self-pay

## 2021-03-13 DIAGNOSIS — I739 Peripheral vascular disease, unspecified: Secondary | ICD-10-CM

## 2021-03-13 NOTE — Progress Notes (Signed)
Tony, Hicks ([id number]) Visit Report for 03/12/2021 Chief Complaint Document Details Patient Name: Date of Service: Tony Hicks [name] 03/12/2021 12:30 PM Medical Record Number: [id number] Patient Account Number: [id number] Date of Birth/Sex: Treating RN: [birth year] (55 y.o. [name] Primary Care Provider: Ricky Stabs Other Clinician: Referring Provider: Treating Provider/Extender: Bonna Gains, Amy Weeks in Treatment: 5 Information Obtained from: Patient Chief Complaint Left lower extremity wounds Electronic Signature(s) Signed: 03/12/2021 1:13:01 PM By: Geralyn Corwin DO Entered By: Geralyn Corwin on 03/12/2021 13:04:30 -------------------------------------------------------------------------------- Debridement Details Patient Name: Date of Service: Tony RK, HA [name] 03/12/2021 12:30 PM Medical Record Number: [id number] Patient Account Number: [id number] Date of Birth/Sex: Treating RN: Jul 17, 1965 (55 y.o. [name] Primary Care Provider: Ricky Stabs Other Clinician: Referring Provider: Treating Provider/Extender: Bonna Gains, Amy Weeks in Treatment: 5 Debridement Performed for Assessment: Wound #2 Left,Lateral Lower Leg Performed By: Clinician Shawn Stall, RN Debridement Type: Chemical/Enzymatic/Mechanical Agent Used: Santyl Severity of Tissue Pre Debridement: Fat layer exposed Level of Consciousness (Pre-procedure): Awake and Alert Pre-procedure Verification/Time Out No Taken: Bleeding: None Response to Treatment: Procedure was tolerated well Level of Consciousness (Post- Awake and Alert procedure): Post Debridement Measurements of Total Wound Length: (cm) 3 Width: (cm) 1.5 Depth: (cm) 0.1 Volume: (cm) 0.353 Character of Wound/Ulcer Post Debridement: Improved Severity of Tissue Post Debridement: Fat layer exposed Post Procedure Diagnosis Same as Pre-procedure Electronic Signature(s) Signed: 03/12/2021 1:13:01 PM By:  Geralyn Corwin DO Signed: 03/13/2021 6:09:03 PM By: Zenaida Deed RN, BSN Entered By: Zenaida Deed on 03/12/2021 12:59:19 -------------------------------------------------------------------------------- Debridement Details Patient Name: Date of Service: Tony RK, HA [name] 03/12/2021 12:30 PM Medical Record Number: [id number] Patient Account Number: [id number] Date of Birth/Sex: Treating RN: [birth year] (55 y.o. [name] Primary Care Provider: Ricky Stabs Other Clinician: Referring Provider: Treating Provider/Extender: Bonna Gains, Amy Weeks in Treatment: 5 Debridement Performed for Assessment: Wound #3 Left Calcaneus Performed By: Clinician Shawn Stall, RN Debridement Type: Chemical/Enzymatic/Mechanical Agent Used: Santyl Severity of Tissue Pre Debridement: Fat layer exposed Level of Consciousness (Pre-procedure): Awake and Alert Pre-procedure Verification/Time Out No Taken: Bleeding: None Response to Treatment: Procedure was tolerated well Level of Consciousness (Post- Awake and Alert procedure): Post Debridement Measurements of Total Wound Length: (cm) 1.8 Width: (cm) 1.9 Depth: (cm) 0.1 Volume: (cm) 0.269 Character of Wound/Ulcer Post Debridement: Improved Severity of Tissue Post Debridement: Fat layer exposed Post Procedure Diagnosis Same as Pre-procedure Electronic Signature(s) Signed: 03/12/2021 1:13:01 PM By: Geralyn Corwin DO Signed: 03/13/2021 6:09:03 PM By: Zenaida Deed RN, BSN Entered By: Zenaida Deed on 03/12/2021 12:59:47 -------------------------------------------------------------------------------- HPI Details Patient Name: Date of Service: Tony RK, HA [name] 03/12/2021 12:30 PM Medical Record Number: [id number] Patient Account Number: [id number] Date of Birth/Sex: Treating RN: July 03, 1966 (55 y.o. [name] Primary Care Provider: Ricky Stabs Other Clinician: Referring Provider: Treating Provider/Extender:  Bonna Gains, Amy Weeks in Treatment: 5 History of Present Illness HPI Description: Admission 8/1 Tony Hicks is a 55 year old male with a past medical history of controlled type 2 diabetes on oral agents, polysubstance abuse (tobacco, alcohol, cocaine) and peripheral arterial disease that presents with 3 left lower extremity wounds. He states that the wound to his heel started out as dried cracking that eventually became infected and worsened. He was following podiatry for this issue. This wound has been ongoing for about 3 months. There are 2 other wounds to his lateral left leg that started out as an injury from hitting his bed post. He states this  happened several weeks ago. He reports mild tenderness to the wound beds. He has been using Santyl prescribed to him on discharge from hospital admission. He has no issues or complaints today. He denies signs of infection. 8/15; patient presents for 2-week follow-up. He states he is out of Santyl. He reports following up with vein and vascular. He reports pain to the wound sites although stable. He currently denies signs of infection. 8/22; patient presents for 1 week follow-up. He states he has been using Santyl daily to the wound beds. He continues to report pain throughout the wound sites. He denies increased warmth erythema or purulent drainage. Unfortunately patient was denied for care at the pain management clinic. He states he is following up with his primary care provider in the next few days. 9/6; patient presents for follow-up. He continues to use Santyl to the wound beds. He reports chronic pain to the wounds that is stable. He reports having a primary care appointment on October 12. He has no issues or complaints today. He overall feels well. Electronic Signature(s) Signed: 03/12/2021 1:13:01 PM By: Geralyn Corwin DO Entered By: Geralyn Corwin on 03/12/2021  13:08:52 -------------------------------------------------------------------------------- Physical Exam Details Patient Name: Date of Service: Tony RK, HA [name] 03/12/2021 12:30 PM Medical Record Number: [id number] Patient Account Number: [id number] Date of Birth/Sex: Treating RN: 1966/06/25 (55 y.o. [name] Primary Care Provider: Ricky Stabs Other Clinician: Referring Provider: Treating Provider/Extender: Bonna Gains, Amy Weeks in Treatment: 5 Constitutional respirations regular, non-labored and within target range for patient.Marland Kitchen Psychiatric pleasant and cooperative. Notes Left lower extremity: 2 wounds present with granulation tissue and scant nonviable tissue present. Most proximal wound is healed with epithelialization to previous wound site. No signs of infection on exam. Electronic Signature(s) Signed: 03/12/2021 1:13:01 PM By: Geralyn Corwin DO Entered By: Geralyn Corwin on 03/12/2021 13:09:35 -------------------------------------------------------------------------------- Physician Orders Details Patient Name: Date of Service: Tony RK, HA [name] 03/12/2021 12:30 PM Medical Record Number: [id number] Patient Account Number: [id number] Date of Birth/Sex: Treating RN: 02/17/1966 (55 y.o. [name] Primary Care Provider: Zonia Kief, [place] Other Clinician: Referring Provider: Treating Provider/Extender: Bonna Gains, Amy Weeks in Treatment: 5 Verbal / Phone Orders: No Diagnosis Coding ICD-10 Coding Code Description S91.302D Unspecified open wound, left foot, subsequent encounter S81.802D Unspecified open wound, left lower leg, subsequent encounter I73.9 Peripheral vascular disease, unspecified E11.621 Type 2 diabetes mellitus with foot ulcer Follow-up Appointments ppointment in 2 weeks. - with Dr. Mikey Bussing Return A Bathing/ Shower/ Hygiene May shower and wash wound with soap and water. - prior to dressing change Edema Control -  Lymphedema / SCD / Other Elevate legs to the level of the heart or above for 30 minutes daily and/or when sitting, a frequency of: - throughout the day Avoid standing for long periods of time. Additional Orders / Instructions Follow Nutritious Diet - 100-120g of protein daily. Wound Treatment Wound #2 - Lower Leg Wound Laterality: Left, Lateral Cleanser: Soap and Water 1 x Per Day/15 Days Discharge Instructions: May shower and wash wound with dial antibacterial soap and water prior to dressing change. Prim Dressing: Santyl Ointment 1 x Per Day/15 Days ary Discharge Instructions: Apply nickel thick amount to wound bed as instructed Secondary Dressing: Woven Gauze Sponge, Non-Sterile 4x4 in 1 x Per Day/15 Days Discharge Instructions: Apply over primary dressing as directed. Secondary Dressing: ABD Pad, 5x9 1 x Per Day/15 Days Discharge Instructions: Apply over primary dressing as directed. Secured With: [employer], 4.5x3.1 (in/yd) 1 x  Per Day/15 Days Discharge Instructions: Secure with Kerlix as directed. Secured With: 49M Medipore H Soft Cloth Surgical Tape, 2x2 (in/yd) 1 x Per Day/15 Days Discharge Instructions: Secure dressing with tape as directed. Wound #3 - Calcaneus Wound Laterality: Left Cleanser: Soap and Water 1 x Per Day/15 Days Discharge Instructions: May shower and wash wound with dial antibacterial soap and water prior to dressing change. Prim Dressing: Santyl Ointment 1 x Per Day/15 Days ary Discharge Instructions: Apply nickel thick amount to wound bed as instructed Secondary Dressing: Woven Gauze Sponge, Non-Sterile 4x4 in 1 x Per Day/15 Days Discharge Instructions: Apply over primary dressing as directed. Secondary Dressing: ABD Pad, 5x9 1 x Per Day/15 Days Discharge Instructions: Apply over primary dressing as directed. Secured With: [employer], 4.5x3.1 (in/yd) 1 x Per Day/15 Days Discharge Instructions: Secure with Kerlix as directed. Secured With:  49M Medipore H Soft Cloth Surgical Tape, 2x2 (in/yd) 1 x Per Day/15 Days Discharge Instructions: Secure dressing with tape as directed. Electronic Signature(s) Signed: 03/12/2021 1:13:01 PM By: Geralyn Corwin DO Entered By: Geralyn Corwin on 03/12/2021 13:09:47 -------------------------------------------------------------------------------- Problem List Details Patient Name: Date of Service: Tony RK, HA [name] 03/12/2021 12:30 PM Medical Record Number: [id number] Patient Account Number: [id number] Date of Birth/Sex: Treating RN: [birth year] (55 y.o. [name] Primary Care Provider: Ricky Stabs Other Clinician: Referring Provider: Treating Provider/Extender: Bonna Gains, Amy Weeks in Treatment: 5 Active Problems ICD-10 Encounter Code Description Active Date MDM Diagnosis S91.302D Unspecified open wound, left foot, subsequent encounter 02/25/2021 No Yes S81.802D Unspecified open wound, left lower leg, subsequent encounter 02/25/2021 No Yes I73.9 Peripheral vascular disease, unspecified 02/04/2021 No Yes E11.621 Type 2 diabetes mellitus with foot ulcer 02/04/2021 No Yes Inactive Problems ICD-10 Code Description Active Date Inactive Date S91.302A Unspecified open wound, left foot, initial encounter 02/04/2021 02/04/2021 S81.802A Unspecified open wound, left lower leg, initial encounter 02/04/2021 02/04/2021 Resolved Problems Electronic Signature(s) Signed: 03/12/2021 1:13:01 PM By: Geralyn Corwin DO Entered By: Geralyn Corwin on 03/12/2021 13:02:34 -------------------------------------------------------------------------------- Progress Note Details Patient Name: Date of Service: Tony RK, HA [name] 03/12/2021 12:30 PM Medical Record Number: [id number] Patient Account Number: [id number] Date of Birth/Sex: Treating RN: [birth year] (55 y.o. [name] Primary Care Provider: Ricky Stabs Other Clinician: Referring Provider: Treating Provider/Extender: Bonna Gains, Amy Weeks in Treatment: 5 Subjective Chief Complaint Information obtained from Patient Left lower extremity wounds History of Present Illness (HPI) Admission 8/1 Mr. Byford Schools is a 55 year old male with a past medical history of controlled type 2 diabetes on oral agents, polysubstance abuse (tobacco, alcohol, cocaine) and peripheral arterial disease that presents with 3 left lower extremity wounds. He states that the wound to his heel started out as dried cracking that eventually became infected and worsened. He was following podiatry for this issue. This wound has been ongoing for about 3 months. There are 2 other wounds to his lateral left leg that started out as an injury from hitting his bed post. He states this happened several weeks ago. He reports mild tenderness to the wound beds. He has been using Santyl prescribed to him on discharge from hospital admission. He has no issues or complaints today. He denies signs of infection. 8/15; patient presents for 2-week follow-up. He states he is out of Santyl. He reports following up with vein and vascular. He reports pain to the wound sites although stable. He currently denies signs of infection. 8/22; patient presents for 1 week follow-up. He states he has been using  Santyl daily to the wound beds. He continues to report pain throughout the wound sites. He denies increased warmth erythema or purulent drainage. Unfortunately patient was denied for care at the pain management clinic. He states he is following up with his primary care provider in the next few days. 9/6; patient presents for follow-up. He continues to use Santyl to the wound beds. He reports chronic pain to the wounds that is stable. He reports having a primary care appointment on October 12. He has no issues or complaints today. He overall feels well. Patient History Information obtained from Patient. Family History Unknown History. Social  History Current every day smoker - 1 cigarette per day, Marital Status - Single, Alcohol Use - Moderate - Beer, Drug Use - Prior History - Cocaine, Caffeine Use - Rarely. Medical History Cardiovascular Patient has history of Peripheral Arterial Disease Endocrine Patient has history of Type II Diabetes Neurologic Patient has history of Neuropathy Objective Constitutional respirations regular, non-labored and within target range for patient.. Vitals Time Taken: 12:38 PM, Height: 67 in, Weight: 190 lbs, BMI: 29.8, Temperature: 98.4 F, Pulse: 86 bpm, Respiratory Rate: 20 breaths/min, Blood Pressure: 135/94 mmHg, Capillary Blood Glucose: 140 mg/dl. Psychiatric pleasant and cooperative. General Notes: Left lower extremity: 2 wounds present with granulation tissue and scant nonviable tissue present. Most proximal wound is healed with epithelialization to previous wound site. No signs of infection on exam. Integumentary (Hair, Skin) Wound #1 status is Open. Original cause of wound was Gradually Appeared. The date acquired was: 11/04/2020. The wound has been in treatment 5 weeks. The wound is located on the Left,Proximal,Lateral Lower Leg. The wound measures 0cm length x 0cm width x 0cm depth; 0cm^2 area and 0cm^3 volume. The wound is limited to skin breakdown. There is no tunneling noted. There is a none present amount of drainage noted. The wound margin is flat and intact. There is no granulation within the wound bed. There is no necrotic tissue within the wound bed. Wound #2 status is Open. Original cause of wound was Gradually Appeared. The date acquired was: 11/04/2020. The wound has been in treatment 5 weeks. The wound is located on the Left,Lateral Lower Leg. The wound measures 3cm length x 1.5cm width x 0.1cm depth; 3.534cm^2 area and 0.353cm^3 volume. There is Fat Layer (Subcutaneous Tissue) exposed. There is no tunneling or undermining noted. There is a medium amount of serosanguineous  drainage noted. The wound margin is flat and intact. There is large (67-100%) red granulation within the wound bed. There is a small (1-33%) amount of necrotic tissue within the wound bed including Adherent Slough. Wound #3 status is Open. Original cause of wound was Gradually Appeared. The date acquired was: 11/04/2020. The wound has been in treatment 5 weeks. The wound is located on the Left Calcaneus. The wound measures 1.8cm length x 1.9cm width x 0.1cm depth; 2.686cm^2 area and 0.269cm^3 volume. There is Fat Layer (Subcutaneous Tissue) exposed. There is no tunneling or undermining noted. There is a medium amount of serosanguineous drainage noted. The wound margin is flat and intact. There is large (67-100%) red, pink granulation within the wound bed. There is a small (1-33%) amount of necrotic tissue within the wound bed including Adherent Slough. Assessment Active Problems ICD-10 Unspecified open wound, left foot, subsequent encounter Unspecified open wound, left lower leg, subsequent encounter Peripheral vascular disease, unspecified Type 2 diabetes mellitus with foot ulcer Patient's wounds have shown improvement in size and appearance since last clinic visit. No signs of infection  on exam. I recommended continuing Santyl. He was evaluated by vein and vascular on 9/2. 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 The bypass is patent. No further recommendations by vein and vascular. 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 There was a Chemical/Enzymatic/Mechanical debridement performed by Shawn Stall, RN.Marland Kitchen Agent used was [employer]. There was no bleeding. The procedure was tolerated well. Post Debridement Measurements: 3cm length x 1.5cm width x 0.1cm depth; 0.353cm^3 volume. Character of Wound/Ulcer Post Debridement is  improved. Severity of Tissue Post Debridement is: Fat layer exposed. Post procedure Diagnosis Wound #2: Same as Pre-Procedure Wound #3 Pre-procedure diagnosis of Wound #3 is a Diabetic Wound/Ulcer of the Lower Extremity located on the Left Calcaneus .Severity of Tissue Pre Debridement is: Fat layer exposed. There was a Chemical/Enzymatic/Mechanical debridement performed by Shawn Stall, RN.Marland Kitchen Agent used was [employer]. There was no bleeding. The procedure was tolerated well. Post Debridement Measurements: 1.8cm length x 1.9cm width x 0.1cm depth; 0.269cm^3 volume. Character of Wound/Ulcer Post Debridement is improved. Severity of Tissue Post Debridement is: Fat layer exposed. Post procedure Diagnosis Wound #3: Same as Pre-Procedure Plan Follow-up Appointments: Return Appointment in 2 weeks. - with Dr. Mikey Bussing Bathing/ Shower/ Hygiene: May shower and wash wound with soap and water. - prior to dressing change Edema Control - Lymphedema / SCD / Other: Elevate legs to the level of the heart or above for 30 minutes daily and/or when sitting, a frequency of: - throughout the day Avoid standing for long periods of time. Additional Orders / Instructions: Follow Nutritious Diet - 100-120g of protein daily. WOUND #2: - Lower Leg Wound Laterality: Left, Lateral Cleanser: Soap and Water 1 x Per Day/15 Days Discharge Instructions: May shower and wash wound with dial antibacterial soap and water prior to dressing change. Prim Dressing: Santyl Ointment 1 x Per Day/15 Days ary Discharge Instructions: Apply nickel thick amount to wound bed as instructed Secondary Dressing: Woven Gauze Sponge, Non-Sterile 4x4 in 1 x Per Day/15 Days Discharge Instructions: Apply over primary dressing as directed. Secondary Dressing: ABD Pad, 5x9 1 x Per Day/15 Days Discharge Instructions: Apply over primary dressing as directed. Secured With: [employer], 4.5x3.1 (in/yd) 1 x Per Day/15 Days Discharge Instructions:  Secure with Kerlix as directed. Secured With: 16M Medipore H Soft Cloth Surgical T ape, 2x2 (in/yd) 1 x Per Day/15 Days Discharge Instructions: Secure dressing with tape as directed. WOUND #3: - Calcaneus Wound Laterality: Left Cleanser: Soap and Water 1 x Per Day/15 Days Discharge Instructions: May shower and wash wound with dial antibacterial soap and water prior to dressing change. Prim Dressing: Santyl Ointment 1 x Per Day/15 Days ary Discharge Instructions: Apply nickel thick amount to wound bed as instructed Secondary Dressing: Woven Gauze Sponge, Non-Sterile 4x4 in 1 x Per Day/15 Days Discharge Instructions: Apply over primary dressing as directed. Secondary Dressing: ABD Pad, 5x9 1 x Per Day/15 Days Discharge Instructions: Apply over primary dressing as directed. Secured With: [employer], 4.5x3.1 (in/yd) 1 x Per Day/15 Days Discharge Instructions: Secure with Kerlix as directed. Secured With: 16M Medipore H Soft Cloth Surgical T ape, 2x2 (in/yd) 1 x Per Day/15 Days Discharge Instructions: Secure dressing with tape as directed. 1. Continue Santyl daily 2. Follow-up in 2 weeks Electronic Signature(s) Signed: 03/12/2021 1:13:01 PM By: Geralyn Corwin DO Entered By: Geralyn Corwin on  03/12/2021 13:11:43 -------------------------------------------------------------------------------- HxROS Details Patient Name: Date of Service: Tony RK, HA [name] 03/12/2021 12:30 PM Medical Record Number: [id number] Patient Account Number: [id number] Date of Birth/Sex: Treating RN: 11-Aug-1965 (55 y.o. Tammy SoursM) Deaton, Bobbi Primary Care Provider: Ricky StabsStephens, Amy Other Clinician: Referring Provider: Treating Provider/Extender: Bonna GainsHoffman, Pearlena Ow Stephens, Amy Weeks in Treatment: 5 Information Obtained From Patient Cardiovascular Medical History: Positive for: Peripheral Arterial Disease Endocrine Medical History: Positive for: Type II Diabetes Time with diabetes: 1 year Treated with: Oral  agents Blood sugar tested every day: No Neurologic Medical History: Positive for: Neuropathy Immunizations Pneumococcal Vaccine: Received Pneumococcal Vaccination: No Implantable Devices None Family and Social History Unknown History: Yes; Current every day smoker - 1 cigarette per day; Marital Status - Single; Alcohol Use: Moderate - Beer; Drug Use: Prior History - Cocaine; Caffeine Use: Rarely; Financial Concerns: No; Food, Clothing or Shelter Needs: No; Support System Lacking: No; Transportation Concerns: No Electronic Signature(s) Signed: 03/12/2021 1:13:01 PM By: Geralyn CorwinHoffman, Nakya Weyand DO Signed: 03/12/2021 5:30:34 PM By: Shawn Stalleaton, Bobbi Entered By: Geralyn CorwinHoffman, Cederick Broadnax on 03/12/2021 13:09:00 -------------------------------------------------------------------------------- SuperBill Details Patient Name: Date of Service: Tony RK, HA [name] 03/12/2021 Medical Record Number: [id number] Patient Account Number: [id number] Date of Birth/Sex: Treating RN: 11-Aug-1965 (55 y.o. Damaris SchoonerM) Boehlein, Linda Primary Care Provider: Ricky StabsStephens, Amy Other Clinician: Referring Provider: Treating Provider/Extender: Bonna GainsHoffman, Anie Juniel Stephens, Amy Weeks in Treatment: 5 Diagnosis Coding ICD-10 Codes Code Description S91.302D Unspecified open wound, left foot, subsequent encounter S81.802D Unspecified open wound, left lower leg, subsequent encounter I73.9 Peripheral vascular disease, unspecified E11.621 Type 2 diabetes mellitus with foot ulcer Facility Procedures CPT4 Code: [id number] Description: [id number] - DEBRIDE W/O ANES NON SELECT Modifier: Quantity: 1 Physician Procedures : CPT4 Code Description Modifier [id number] - WC PHYS LEVEL 3 - EST PT ICD-10 Diagnosis Description S91.302D Unspecified open wound, left foot, subsequent encounter S81.802D Unspecified open wound, left lower leg, subsequent encounter I73.9 Peripheral  vascular disease, unspecified E11.621 Type 2 diabetes mellitus with foot ulcer Quantity:  1 Electronic Signature(s) Signed: 03/12/2021 1:13:01 PM By: Geralyn CorwinHoffman, Kolby Myung DO Entered By: Geralyn CorwinHoffman, Niranjan Rufener on 03/12/2021 13:12:18

## 2021-03-13 NOTE — Progress Notes (Signed)
KAIO, KUHLMAN ([id number]) Visit Report for 03/12/2021 Arrival Information Details Patient Name: Date of Service: Tony Hicks 03/12/2021 12:30 PM Medical Record Number: [id number] Patient Account Number: [id number] Date of Birth/Sex: Treating RN: Mar 08, 1966 (55 y.o. [name] Primary Care Lanita Stammen: Ricky Stabs Other Clinician: Referring Teleshia Lemere: Treating Alanee Ting/Extender: Bonna Gains, Amy Weeks in Treatment: 5 Visit Information History Since Last Visit Added or deleted any medications: No Patient Arrived: Crutches Any new allergies or adverse reactions: No Arrival Time: 12:38 Had a fall or experienced change in No Accompanied By: self activities of daily living that may affect Transfer Assistance: None risk of falls: Patient Identification Verified: Yes Signs or symptoms of abuse/neglect since last visito No Secondary Verification Process Completed: Yes Hospitalized since last visit: No Patient Requires Transmission-Based Precautions: No Implantable device outside of the clinic excluding No Patient Has Alerts: Yes cellular tissue based products placed in the center Patient Alerts: L ABI: 0.36 TBI: 0 since last visit: Pain Present Now: Yes Electronic Signature(s) Signed: 03/12/2021 5:30:34 PM By: Shawn Stall Entered By: Shawn Stall on 03/12/2021 12:39:06 -------------------------------------------------------------------------------- Encounter Discharge Information Details Patient Name: Date of Service: [name], [name] 03/12/2021 12:30 PM Medical Record Number: [id number] Patient Account Number: [id number] Date of Birth/Sex: Treating RN: 26-Mar-1966 (55 y.o. [name] Primary Care Bellarose Burtt: Ricky Stabs Other Clinician: Referring Yanni Quiroa: Treating Brieonna Crutcher/Extender: Bonna Gains, Amy Weeks in Treatment: 5 Encounter Discharge Information Items Post Procedure Vitals Discharge Condition: Stable Temperature (F):  98.4 Ambulatory Status: Crutches Pulse (bpm): 86 Discharge Destination: Home Respiratory Rate (breaths/min): 18 Transportation: Private Auto Blood Pressure (mmHg): 135/94 Accompanied By: self Schedule Follow-up Appointment: Yes Clinical Summary of Care: Patient Declined Electronic Signature(s) Signed: 03/13/2021 6:09:03 PM By: Zenaida Deed RN, BSN Entered By: Zenaida Deed on 03/12/2021 13:08:46 -------------------------------------------------------------------------------- Lower Extremity Assessment Details Patient Name: Date of Service: [name], [name] 03/12/2021 12:30 PM Medical Record Number: [id number] Patient Account Number: [id number] Date of Birth/Sex: Treating RN: [birth year] (55 y.o. [name] Primary Care Trevonne Nyland: Ricky Stabs Other Clinician: Referring Numan Zylstra: Treating Demyah Smyre/Extender: Bonna Gains, Amy Weeks in Treatment: 5 Edema Assessment Assessed: [Left: Yes] [Right: No] Edema: [Left: Ye] [Right: s] Calf Left: Right: Point of Measurement: 34 cm From Medial Instep 38 cm Ankle Left: Right: Point of Measurement: 11 cm From Medial Instep 23 cm Vascular Assessment Pulses: Dorsalis Pedis Palpable: [Left:Yes] Electronic Signature(s) Signed: 03/12/2021 5:30:34 PM By: Shawn Stall Entered By: Shawn Stall on 03/12/2021 12:42:14 -------------------------------------------------------------------------------- Multi Wound Chart Details Patient Name: Date of Service: [name], [name] 03/12/2021 12:30 PM Medical Record Number: [id number] Patient Account Number: [id number] Date of Birth/Sex: Treating RN: 07/12/1965 (55 y.o. [name] Primary Care Shimon Trowbridge: Ricky Stabs Other Clinician: Referring Zaevion Parke: Treating Aquanetta Schwarz/Extender: Bonna Gains, Amy Weeks in Treatment: 5 Vital Signs Height(in): 67 Capillary Blood Glucose(mg/dl): 562 Weight(lbs): 563 Pulse(bpm): 86 Body Mass Index(BMI): 30 Blood  Pressure(mmHg): 135/94 Temperature(F): 98.4 Respiratory Rate(breaths/min): 20 Photos: [1:Left, Proximal, Lateral Lower Leg] [2:Left, Lateral Lower Leg] [3:Left Calcaneus] Wound Location: [1:Gradually Appeared] [2:Gradually Appeared] [3:Gradually Appeared] Wounding Event: [1:Arterial Insufficiency Ulcer] [2:Arterial Insufficiency Ulcer] [3:Diabetic Wound/Ulcer of the Lower] Primary Etiology: [1:N/A] [2:N/A] [3:Extremity Arterial Insufficiency Ulcer] Secondary Etiology: [1:Peripheral Arterial Disease, Type II] [2:Peripheral Arterial Disease, Type IIPeripheral Arterial Disease, Type II] Comorbid History: [1:Diabetes, Neuropathy 11/04/2020] [2:Diabetes, Neuropathy 11/04/2020] [3:Diabetes, Neuropathy 11/04/2020] Date Acquired: [1:5] [2:5] [3:5] Weeks of Treatment: [1:Open] [2:Open] [3:Open] Wound Status: [1:Yes] [2:No] [3:No] Clustered Wound: [1:0] [2:N/A] [3:N/A] Clustered Quantity: [1:0x0x0] [2:3x1.5x0.1] [  3:1.8x1.9x0.1] Measurements L x W x D (cm) [1:0] [2:3.534] [3:2.686] A (cm) : rea [1:0] [2:0.353] [3:0.269] Volume (cm) : [1:100.00%] [2:72.60%] [3:86.00%] % Reduction in A [1:rea: 100.00%] [2:72.70%] [3:95.30%] % Reduction in Volume: [1:Full Thickness Without Exposed] [2:Full Thickness Without Exposed] [3:Grade 2] Classification: [1:Support Structures None Present] [2:Support Structures Medium] [3:Medium] Exudate A mount: [1:N/A] [2:Serosanguineous] [3:Serosanguineous] Exudate Type: [1:N/A] [2:red, brown] [3:red, brown] Exudate Color: [1:Flat and Intact] [2:Flat and Intact] [3:Flat and Intact] Wound Margin: [1:None Present (0%)] [2:Large (67-100%)] [3:Large (67-100%)] Granulation A mount: [1:N/A] [2:Red] [3:Red, Pink] Granulation Quality: [1:None Present (0%)] [2:Small (1-33%)] [3:Small (1-33%)] Necrotic A mount: [1:Fascia: No] [2:Fat Layer (Subcutaneous Tissue): Yes Fat Layer (Subcutaneous Tissue): Yes] Exposed Structures: [1:Fat Layer (Subcutaneous Tissue): No Tendon: No Muscle: No  Joint: No Bone: No Limited to Skin Breakdown Large (67-100%)] [2:Fascia: No Tendon: No Muscle: No Joint: No Bone: No Small (1-33%)] [3:Fascia: No Tendon: No Muscle: No Joint: No Bone: No Medium  (34-66%)] Epithelialization: [1:N/A] [2:Chemical/Enzymatic/Mechanical] [3:Chemical/Enzymatic/Mechanical] Debridement: [1:N/A] [2:N/A] [3:N/A] Instrument: [1:N/A] [2:None] [3:None] Bleeding: Debridement Treatment Response: N/A [2:Procedure was tolerated well] [3:Procedure was tolerated well] Post Debridement Measurements L x N/A [2:3x1.5x0.1] [3:1.8x1.9x0.1] W x D (cm) [1:N/A] [2:0.353] [3:0.269] Post Debridement Volume: (cm) [1:N/A] [2:Debridement] [3:Debridement] Treatment Notes Electronic Signature(s) Signed: 03/12/2021 1:13:01 PM By: Geralyn Corwin DO Signed: 03/12/2021 5:30:34 PM By: Shawn Stall Entered By: Geralyn Corwin on 03/12/2021 13:02:46 -------------------------------------------------------------------------------- Multi-Disciplinary Care Plan Details Patient Name: Date of Service: [name], [name] 03/12/2021 12:30 PM Medical Record Number: [id number] Patient Account Number: [id number] Date of Birth/Sex: Treating RN: 1966-01-20 (55 y.o. [name] Primary Care Shiana Rappleye: Ricky Stabs Other Clinician: Referring Kjirsten Bloodgood: Treating Kyelle Urbas/Extender: Caesar Chestnut Weeks in Treatment: 5 Multidisciplinary Care Plan reviewed with physician Active Inactive Nutrition Nursing Diagnoses: Impaired glucose control: actual or potential Potential for alteratiion in Nutrition/Potential for imbalanced nutrition Goals: Patient/caregiver agrees to and verbalizes understanding of need to use nutritional supplements and/or vitamins as prescribed Date Initiated: 02/04/2021 Target Resolution Date: 05/03/2021 Goal Status: Active Patient/caregiver will maintain therapeutic glucose control Date Initiated: 02/04/2021 Target Resolution Date: 04/25/2021 Goal Status:  Active Interventions: Assess HgA1c results as ordered upon admission and as needed Assess patient nutrition upon admission and as needed per policy Provide education on elevated blood sugars and impact on wound healing Provide education on nutrition Treatment Activities: Education provided on Nutrition : 02/04/2021 Notes: Wound/Skin Impairment Nursing Diagnoses: Impaired tissue integrity Knowledge deficit related to smoking impact on wound healing Knowledge deficit related to ulceration/compromised skin integrity Goals: Patient will demonstrate a reduced rate of smoking or cessation of smoking Date Initiated: 02/04/2021 Target Resolution Date: 04/26/2021 Goal Status: Active Patient/caregiver will verbalize understanding of skin care regimen Date Initiated: 02/04/2021 Target Resolution Date: 04/11/2021 Goal Status: Active Interventions: Assess patient/caregiver ability to obtain necessary supplies Assess patient/caregiver ability to perform ulcer/skin care regimen upon admission and as needed Assess ulceration(s) every visit Provide education on smoking Provide education on ulcer and skin care Notes: Electronic Signature(s) Signed: 03/12/2021 5:30:34 PM By: Shawn Stall Entered By: Shawn Stall on 03/12/2021 12:45:24 -------------------------------------------------------------------------------- Pain Assessment Details Patient Name: Date of Service: [name] RV EY 03/12/2021 12:30 PM Medical Record Number: [id number] Patient Account Number: [id number] Date of Birth/Sex: Treating RN: Aug 17, 1965 (55 y.o. [name] Primary Care Laurissa Cowper: Ricky Stabs Other Clinician: Referring Brynnleigh Mcelwee: Treating Aelyn Stanaland/Extender: Bonna Gains, Amy Weeks in Treatment: 5 Active Problems Location of Pain Severity and Description of Pain Patient Has Paino Yes Site Locations Pain Location: Pain  Location: Generalized Pain, Pain in Ulcers Rate the pain. Current Pain Level:  Insensate Worst Pain Level: 10 Least Pain Level: 0 Tolerable Pain Level: 9 Pain Management and Medication Current Pain Management: Medication: No Cold Application: No Rest: No Massage: No Activity: No T.E.N.S.: No Heat Application: No Leg drop or elevation: No Is the Current Pain Management Adequate: Inadequate How does your wound impact your activities of daily livingo Sleep: No Bathing: No Appetite: No Relationship With Others: No Bladder Continence: No Emotions: No Bowel Continence: No Work: No Toileting: No Drive: No Dressing: No Hobbies: No Notes Per patient motrin is not working for pain. Per patient working on finding a PCP. Per patient scheduled for a new PCP 04/17/2021. Electronic Signature(s) Signed: 03/12/2021 5:30:34 PM By: Shawn Stall Entered By: Shawn Stall on 03/12/2021 12:40:30 -------------------------------------------------------------------------------- Patient/Caregiver Education Details Patient Name: Date of Service: Tony Hicks 9/6/2022andnbsp12:30 PM Medical Record Number: [id number] Patient Account Number: [id number] Date of Birth/Gender: Treating RN: 1966/05/06 (55 y.o. [name] Primary Care Physician: Ricky Stabs Other Clinician: Referring Physician: Treating Physician/Extender: Caesar Chestnut Weeks in Treatment: 5 Education Assessment Education Provided To: Patient Education Topics Provided Elevated Blood Sugar/ Impact on Healing: Handouts: Elevated Blood Sugars: How Do They Affect Wound Healing Methods: Explain/Verbal Responses: Reinforcements needed Electronic Signature(s) Signed: 03/12/2021 5:30:34 PM By: Shawn Stall Entered By: Shawn Stall on 03/12/2021 12:45:38 -------------------------------------------------------------------------------- Wound Assessment Details Patient Name: Date of Service: [name] RV EY 03/12/2021 12:30 PM Medical Record Number: [id number] Patient Account Number:  [id number] Date of Birth/Sex: Treating RN: 30-Apr-1966 (55 y.o. [name] Primary Care Demarious Kapur: Ricky Stabs Other Clinician: Referring Orla Jolliff: Treating Drae Mitzel/Extender: Bonna Gains, Amy Weeks in Treatment: 5 Wound Status Wound Number: 1 Primary Etiology: Arterial Insufficiency Ulcer Wound Location: Left, Proximal, Lateral Lower Leg Wound Status: Open Wounding Event: Gradually Appeared Comorbid History: Peripheral Arterial Disease, Type II Diabetes, Neuropathy Date Acquired: 11/04/2020 Weeks Of Treatment: 5 Clustered Wound: Yes Photos Wound Measurements Length: (cm) Width: (cm) Depth: (cm) Clustered Quantity: Area: (cm) Volume: (cm) 0 % Reduction in Area: 100% 0 % Reduction in Volume: 100% 0 Epithelialization: Large (67-100%) 0 Tunneling: No 0 0 Wound Description Classification: Full Thickness Without Exposed Support Structures Wound Margin: Flat and Intact Exudate Amount: None Present Foul Odor After Cleansing: No Slough/Fibrino No Wound Bed Granulation Amount: None Present (0%) Exposed Structure Necrotic Amount: None Present (0%) Fascia Exposed: No Fat Layer (Subcutaneous Tissue) Exposed: No Tendon Exposed: No Muscle Exposed: No Joint Exposed: No Bone Exposed: No Limited to Skin Breakdown Electronic Signature(s) Signed: 03/12/2021 5:30:34 PM By: Shawn Stall Signed: 03/13/2021 6:09:03 PM By: Zenaida Deed RN, BSN Entered By: Zenaida Deed on 03/12/2021 12:45:48 -------------------------------------------------------------------------------- Wound Assessment Details Patient Name: Date of Service: [name], [name] 03/12/2021 12:30 PM Medical Record Number: [id number] Patient Account Number: [id number] Date of Birth/Sex: Treating RN: January 26, 1966 (55 y.o. [name] Primary Care Shreya Lacasse: Ricky Stabs Other Clinician: Referring Jaynie Hitch: Treating Randal Goens/Extender: Bonna Gains, Amy Weeks in Treatment: 5 Wound  Status Wound Number: 2 Primary Etiology: Arterial Insufficiency Ulcer Wound Location: Left, Lateral Lower Leg Wound Status: Open Wounding Event: Gradually Appeared Comorbid History: Peripheral Arterial Disease, Type II Diabetes, Neuropathy Date Acquired: 11/04/2020 Weeks Of Treatment: 5 Clustered Wound: No Photos Wound Measurements Length: (cm) 3 Width: (cm) 1.5 Depth: (cm) 0.1 Area: (cm) 3.534 Volume: (cm) 0.353 % Reduction in Area: 72.6% % Reduction in Volume: 72.7% Epithelialization: Small (1-33%) Tunneling: No Undermining: No Wound Description Classification: Full  Thickness Without Exposed Support Structures Wound Margin: Flat and Intact Exudate Amount: Medium Exudate Type: Serosanguineous Exudate Color: red, brown Foul Odor After Cleansing: No Slough/Fibrino Yes Wound Bed Granulation Amount: Large (67-100%) Exposed Structure Granulation Quality: Red Fascia Exposed: No Necrotic Amount: Small (1-33%) Fat Layer (Subcutaneous Tissue) Exposed: Yes Necrotic Quality: Adherent Slough Tendon Exposed: No Muscle Exposed: No Joint Exposed: No Bone Exposed: No Treatment Notes Wound #2 (Lower Leg) Wound Laterality: Left, Lateral Cleanser Soap and Water Discharge Instruction: May shower and wash wound with dial antibacterial soap and water prior to dressing change. Peri-Wound Care Topical Primary Dressing Santyl Ointment Discharge Instruction: Apply nickel thick amount to wound bed as instructed Secondary Dressing Woven Gauze Sponge, Non-Sterile 4x4 in Discharge Instruction: Apply over primary dressing as directed. ABD Pad, 5x9 Discharge Instruction: Apply over primary dressing as directed. Secured With [employer]erl[employer] Roll Sterile, 4.5x3.1 (in/yd) Discharge Instruction: Secure with Kerlix as directed. 5M Medipore H Soft Cloth Surgical T ape, 2x2 (in/yd) Discharge Instruction: Secure dressing with tape as directed. Compression Wrap Compression Stockings Add-Ons Electronic  Signature(s) Signed: 03/12/2021 5:30:34 PM By: Shawn Stalleaton, Bobbi Signed: 03/13/2021 6:09:03 PM By: Zenaida DeedBoehlein, Linda RN, BSN Entered By: Zenaida DeedBoehlein, Linda on 03/12/2021 12:46:24 -------------------------------------------------------------------------------- Wound Assessment Details Patient Name: Date of Service: [name], [name] 03/12/2021 12:30 PM Medical Record Number: [id number] Patient Account Number: [id number] Date of Birth/Sex: Treating RN: [birth year] (55 y.o. Tammy SoursM) Deaton, Bobbi Primary Care Lavi Sheehan: Ricky StabsStephens, Amy Other Clinician: Referring Jonna Dittrich: Treating Arbie Reisz/Extender: Bonna GainsHoffman, Jessica Stephens, Amy Weeks in Treatment: 5 Wound Status Wound Number: 3 Primary Etiology: Diabetic Wound/Ulcer of the Lower Extremity Wound Location: Left Calcaneus Secondary Etiology: Arterial Insufficiency Ulcer Wounding Event: Gradually Appeared Wound Status: Open Date Acquired: 11/04/2020 Comorbid History: Peripheral Arterial Disease, Type II Diabetes, Neuropathy Weeks Of Treatment: 5 Clustered Wound: No Photos Wound Measurements Length: (cm) 1.8 Width: (cm) 1.9 Depth: (cm) 0.1 Area: (cm) 2.686 Volume: (cm) 0.269 % Reduction in Area: 86% % Reduction in Volume: 95.3% Epithelialization: Medium (34-66%) Tunneling: No Undermining: No Wound Description Classification: Grade 2 Wound Margin: Flat and Intact Exudate Amount: Medium Exudate Type: Serosanguineous Exudate Color: red, brown Foul Odor After Cleansing: No Slough/Fibrino No Wound Bed Granulation Amount: Large (67-100%) Exposed Structure Granulation Quality: Red, Pink Fascia Exposed: No Necrotic Amount: Small (1-33%) Fat Layer (Subcutaneous Tissue) Exposed: Yes Necrotic Quality: Adherent Slough Tendon Exposed: No Muscle Exposed: No Joint Exposed: No Bone Exposed: No Treatment Notes Wound #3 (Calcaneus) Wound Laterality: Left Cleanser Soap and Water Discharge Instruction: May shower and wash wound with dial antibacterial  soap and water prior to dressing change. Peri-Wound Care Topical Primary Dressing Santyl Ointment Discharge Instruction: Apply nickel thick amount to wound bed as instructed Secondary Dressing Woven Gauze Sponge, Non-Sterile 4x4 in Discharge Instruction: Apply over primary dressing as directed. ABD Pad, 5x9 Discharge Instruction: Apply over primary dressing as directed. Secured With [employer] Roll Sterile, 4.5x3.1 (in/yd) Discharge Instruction: Secure with Kerlix as directed. 5M Medipore H Soft Cloth Surgical T ape, 2x2 (in/yd) Discharge Instruction: Secure dressing with tape as directed. Compression Wrap Compression Stockings Add-Ons Electronic Signature(s) Signed: 03/12/2021 5:30:34 PM By: Shawn Stalleaton, Bobbi Signed: 03/13/2021 6:09:03 PM By: Zenaida DeedBoehlein, Linda RN, BSN Entered By: Zenaida DeedBoehlein, Linda on 03/12/2021 12:47:08 -------------------------------------------------------------------------------- Vitals Details Patient Name: Date of Service: [name], [name] 03/12/2021 12:30 PM Medical Record Number: [id number] Patient Account Number: [id number] Date of Birth/Sex: Treating RN: [birth year] (55 y.o. Tammy SoursM) Deaton, Bobbi Primary Care Dreanna Kyllo: Ricky StabsStephens, Amy Other Clinician: Referring Deette Revak: Treating Tyeisha Dinan/Extender: Bonna GainsHoffman, Jessica Stephens, Amy Tania AdeWeeks  in Treatment: 5 Vital Signs Time Taken: 12:38 Temperature (F): 98.4 Height (in): 67 Pulse (bpm): 86 Weight (lbs): 190 Respiratory Rate (breaths/min): 20 Body Mass Index (BMI): 29.8 Blood Pressure (mmHg): 135/94 Capillary Blood Glucose (mg/dl): 480 Reference Range: 80 - 120 mg / dl Electronic Signature(s) Signed: 03/12/2021 5:30:34 PM By: Shawn Stall Entered By: Shawn Stall on 03/12/2021 12:39:28

## 2021-03-14 ENCOUNTER — Ambulatory Visit (INDEPENDENT_AMBULATORY_CARE_PROVIDER_SITE_OTHER): Payer: Self-pay | Admitting: Nurse Practitioner

## 2021-03-14 ENCOUNTER — Other Ambulatory Visit: Payer: Self-pay

## 2021-03-14 ENCOUNTER — Encounter: Payer: Self-pay | Admitting: Nurse Practitioner

## 2021-03-14 VITALS — BP 136/76 | HR 79 | Temp 97.9°F | Ht 66.0 in | Wt 190.1 lb

## 2021-03-14 DIAGNOSIS — E118 Type 2 diabetes mellitus with unspecified complications: Secondary | ICD-10-CM

## 2021-03-14 DIAGNOSIS — Z23 Encounter for immunization: Secondary | ICD-10-CM

## 2021-03-14 DIAGNOSIS — Z1159 Encounter for screening for other viral diseases: Secondary | ICD-10-CM

## 2021-03-14 DIAGNOSIS — F329 Major depressive disorder, single episode, unspecified: Secondary | ICD-10-CM

## 2021-03-14 DIAGNOSIS — D508 Other iron deficiency anemias: Secondary | ICD-10-CM

## 2021-03-14 DIAGNOSIS — Z7689 Persons encountering health services in other specified circumstances: Secondary | ICD-10-CM

## 2021-03-14 MED ORDER — NICOTINE 21 MG/24HR TD PT24
21.0000 mg | MEDICATED_PATCH | Freq: Every day | TRANSDERMAL | 0 refills | Status: DC
  Filled 2021-03-14: qty 28, 28d supply, fill #0

## 2021-03-14 MED ORDER — COLLAGENASE 250 UNIT/GM EX OINT
TOPICAL_OINTMENT | Freq: Every day | CUTANEOUS | 0 refills | Status: DC
  Filled 2021-03-14: qty 30, 30d supply, fill #0

## 2021-03-14 MED ORDER — CITALOPRAM HYDROBROMIDE 20 MG PO TABS
20.0000 mg | ORAL_TABLET | Freq: Every day | ORAL | 3 refills | Status: DC
  Filled 2021-03-14: qty 30, 30d supply, fill #0
  Filled 2021-04-25 – 2021-05-18 (×3): qty 30, 30d supply, fill #1
  Filled 2021-06-18: qty 30, 30d supply, fill #2
  Filled 2021-08-01: qty 30, 30d supply, fill #0
  Filled 2021-08-01: qty 30, 30d supply, fill #3
  Filled 2021-09-10 (×2): qty 30, 30d supply, fill #0
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
## 2021-03-14 NOTE — Patient Instructions (Signed)
Health Maintenance, Male Adopting a healthy lifestyle and getting preventive care are important in promoting health and wellness. Ask your health care provider about: The right schedule for you to have regular tests and exams. Things you can do on your own to prevent diseases and keep yourself healthy. What should I know about diet, weight, and exercise? Eat a healthy diet  Eat a diet that includes plenty of vegetables, fruits, low-fat dairy products, and lean protein. Do not eat a lot of foods that are high in solid fats, added sugars, or sodium. Maintain a healthy weight Body mass index (BMI) is a measurement that can be used to identify possible weight problems. It estimates body fat based on height and weight. Your health care provider can help determine your BMI and help you achieve or maintain a healthy weight. Get regular exercise Get regular exercise. This is one of the most important things you can do for your health. Most adults should: Exercise for at least 150 minutes each week. The exercise should increase your heart rate and make you sweat (moderate-intensity exercise). Do strengthening exercises at least twice a week. This is in addition to the moderate-intensity exercise. Spend less time sitting. Even light physical activity can be beneficial. Watch cholesterol and blood lipids Have your blood tested for lipids and cholesterol at 54 years of age, then have this test every 5 years. You may need to have your cholesterol levels checked more often if: Your lipid or cholesterol levels are high. You are older than 54 years of age. You are at high risk for heart disease. What should I know about cancer screening? Many types of cancers can be detected early and may often be prevented. Depending on your health history and family history, you may need to have cancer screening at various ages. This may include screening for: Colorectal cancer. Prostate cancer. Skin cancer. Lung  cancer. What should I know about heart disease, diabetes, and high blood pressure? Blood pressure and heart disease High blood pressure causes heart disease and increases the risk of stroke. This is more likely to develop in people who have high blood pressure readings, are of African descent, or are overweight. Talk with your health care provider about your target blood pressure readings. Have your blood pressure checked: Every 3-5 years if you are 18-39 years of age. Every year if you are 40 years old or older. If you are between the ages of 65 and 75 and are a current or former smoker, ask your health care provider if you should have a one-time screening for abdominal aortic aneurysm (AAA). Diabetes Have regular diabetes screenings. This checks your fasting blood sugar level. Have the screening done: Once every three years after age 45 if you are at a normal weight and have a low risk for diabetes. More often and at a younger age if you are overweight or have a high risk for diabetes. What should I know about preventing infection? Hepatitis B If you have a higher risk for hepatitis B, you should be screened for this virus. Talk with your health care provider to find out if you are at risk for hepatitis B infection. Hepatitis C Blood testing is recommended for: Everyone born from 1945 through 1965. Anyone with known risk factors for hepatitis C. Sexually transmitted infections (STIs) You should be screened each year for STIs, including gonorrhea and chlamydia, if: You are sexually active and are younger than 54 years of age. You are older than 54 years   of age and your health care provider tells you that you are at risk for this type of infection. Your sexual activity has changed since you were last screened, and you are at increased risk for chlamydia or gonorrhea. Ask your health care provider if you are at risk. Ask your health care provider about whether you are at high risk for HIV.  Your health care provider may recommend a prescription medicine to help prevent HIV infection. If you choose to take medicine to prevent HIV, you should first get tested for HIV. You should then be tested every 3 months for as long as you are taking the medicine. Follow these instructions at home: Lifestyle Do not use any products that contain nicotine or tobacco, such as cigarettes, e-cigarettes, and chewing tobacco. If you need help quitting, ask your health care provider. Do not use street drugs. Do not share needles. Ask your health care provider for help if you need support or information about quitting drugs. Alcohol use Do not drink alcohol if your health care provider tells you not to drink. If you drink alcohol: Limit how much you have to 0-2 drinks a day. Be aware of how much alcohol is in your drink. In the [place] one drink equals one 12 oz bottle of beer (355 mL), one 5 oz glass of wine (148 mL), or one 1 oz glass of hard liquor (44 mL). General instructions Schedule regular health, dental, and eye exams. Stay current with your vaccines. Tell your health care provider if: You often feel depressed. You have ever been abused or do not feel safe at home. Summary Adopting a healthy lifestyle and getting preventive care are important in promoting health and wellness. Follow your health care provider's instructions about healthy diet, exercising, and getting tested or screened for diseases. Follow your health care provider's instructions on monitoring your cholesterol and blood pressure. This information is not intended to replace advice given to you by your health care provider. Make sure you discuss any questions you have with your health care provider. Document Revised: 08/31/2020 Document Reviewed: 06/16/2018 Elsevier Patient Education  2022 Else[employer] Inc[employer]  Major Depressive Disorder, Adult Major depressive disorder (MDD) is a mental health condition. It may also be called  clinical depression or unipolar depression. MDD causes symptoms of sadness, hopelessness, and loss of interest in things. These symptoms last most of the day, almost every day, for 2 weeks. MDD can also cause physical symptoms. It can interfere with relationships and with everyday activities, such as work, school, and activities that are usually pleasant. MDD may be mild, moderate, or severe. It may be single-episode MDD, which happens once, or recurrent MDD, which may occur multiple times. What are the causes? The exact cause of this condition is not known. MDD is most likely caused by a combination of things, which may include: Your personality traits. Learned or conditioned behaviors or thoughts or feelings that reinforce negativity. Any alcohol or substance misuse. Long-term (chronic) physical or mental health illness. Going through a traumatic experience or major life changes. What increases the risk? The following factors may make someone more likely to develop MDD: A family history of depression. Being a woman. Troubled family relationships. Abnormally low levels of certain brain chemicals. Traumatic or painful events in childhood, especially abuse or loss of a parent. A lot of stress from life experiences, such as poor living conditions or discrimination. Chronic physical illness or other mental health disorders. What are the signs or symptoms? The main  symptoms of MDD usually include: Constant depressed or irritable mood. A loss of interest in things and activities. Other symptoms include: Sleeping or eating too much or too little. Unexplained weight gain or weight loss. Tiredness or low energy. Being agitated, restless, or weak. Feeling hopeless, worthless, or guilty. Trouble thinking clearly or making decisions. Thoughts of suicide or thoughts of harming others. Isolating oneself or avoiding other people or activities. Trouble completing tasks, work, or any normal  obligations. Severe symptoms of this condition may include: Psychotic depression.This may include false beliefs, or delusions. It may also include seeing, hearing, tasting, smelling, or feeling things that are not real (hallucinations). Chronic depression or persistent depressive disorder. This is low-level depression that lasts for at least 2 years. Melancholic depression, or feeling extremely sad and hopeless. Catatonic depression, which includes trouble speaking and trouble moving. How is this diagnosed? This condition may be diagnosed based on: Your symptoms. Your medical and mental health history. You may be asked questions about your lifestyle, including any drug and alcohol use. A physical exam. Blood tests to rule out other conditions. MDD is confirmed if you have the following symptoms most of the day, nearly every day, in a 2-week period: Either a depressed mood or loss of interest. At least four other MDD symptoms. How is this treated? This condition is usually treated by mental health professionals, such as psychologists, psychiatrists, and clinical social workers. You may need more than one type of treatment. Treatment may include: Psychotherapy, also called talk therapy or counseling. Types of psychotherapy include: Cognitive behavioral therapy (CBT). This teaches you to recognize unhealthy feelings, thoughts, and behaviors, and replace them with positive thoughts and actions. Interpersonal therapy (IPT). This helps you to improve the way you communicate with others or relate to them. Family therapy. This treatment includes members of your family. Medicines to treat anxiety and depression. These medicines help to balance the brain chemicals that affect your emotions. Lifestyle changes. You may be asked to: Limit alcohol use and avoid drug use. Get regular exercise. Get plenty of sleep. Make healthy eating choices. Spend more time outdoors. Brain stimulation. This may be done  if symptoms are very severe and other treatments have not worked. Examples of this treatment are electroconvulsive therapy and transcranial magnetic stimulation. Follow these instructions at home: Activity Exercise regularly and spend time outdoors. Find activities that you enjoy doing, and make time to do them. Find healthy ways to manage stress, such as: Meditation or deep breathing. Spending time in nature. Journaling. Return to your normal activities as told by your health care provider. Ask your health care provider what activities are safe for you. Alcohol and drug use If you drink alcohol: Limit how much you use to: 0-1 drink a day for women who are not pregnant. 0-2 drinks a day for men. Be aware of how much alcohol is in your drink. In the [place] one drink equals one 12 oz bottle of beer (355 mL), one 5 oz glass of wine (148 mL), or one 1 oz glass of hard liquor (44 mL). Discuss your alcohol use with your health care provider. Alcohol can affect any antidepressant medicines you are taking. Discuss any drug use with your health care provider. General instructions  Take over-the-counter and prescription medicines only as told by your health care provider. Eat a healthy diet and get plenty of sleep. Consider joining a support group. Your health care provider may be able to recommend one. Keep all follow-up visits as told  by your health care provider. This is important. Where to find more information [employer] on Mental Illness: www.nami.org [place] [employer] of Mental Health: http://www.maynard.net/ Contact a health care provider if: Your symptoms get worse. You develop new symptoms. Get help right away if: You self-harm. You have serious thoughts about hurting yourself or others. You hallucinate. If you ever feel like you may hurt yourself or others, or have thoughts about taking your own life, get help right away. Go to your nearest emergency department or: Call your  local emergency services (911 in the [place] Call a suicide crisis helpline, such as the National Suicide Prevention Lifeline at (365)128-0481. This is open 24 hours a day in the [place] Text the Crisis Text Line at 650 848 5022 (in the [place] Summary Major depressive disorder (MDD) is a mental health condition. MDD causes symptoms of sadness, hopelessness, and loss of interest in things. These symptoms last most of the day, almost every day, for 2 weeks. The symptoms of MDD can interfere with relationships and with everyday activities. Treatments and support are available for people who develop MDD. You may need more than one type of treatment. Get help right away if you have serious thoughts about hurting yourself or others. This information is not intended to replace advice given to you by your health care provider. Make sure you discuss any questions you have with your health care provider. Document Revised: 06/04/2019 Document Reviewed: 06/04/2019 Elsevier Patient Education  2022 Else[employer] Inc.  Steps to Quit Smoking Smoking tobacco is the leading cause of preventable death. It can affect almost every organ in the body. Smoking puts you and people around you at risk for many serious, long-lasting (chronic) diseases. Quitting smoking can be hard, but it is one of the best things that you can do for your health. It is never too late to quit. How do I get ready to quit? When you decide to quit smoking, make a plan to help you succeed. Before you quit: Pick a date to quit. Set a date within the next 2 weeks to give you time to prepare. Write down the reasons why you are quitting. Keep this list in places where you will see it often. Tell your family, friends, and co-workers that you are quitting. Their support is important. Talk with your doctor about the choices that may help you quit. Find out if your health insurance will pay for these treatments. Know the people, places, things, and activities that  make you want to smoke (triggers). Avoid them. What first steps can I take to quit smoking? Throw away all cigarettes at home, at work, and in your car. Throw away the things that you use when you smoke, such as ashtrays and lighters. Clean your car. Make sure to empty the ashtray. Clean your home, including curtains and carpets. What can I do to help me quit smoking? Talk with your doctor about taking medicines and seeing a counselor at the same time. You are more likely to succeed when you do both. If you are pregnant or breastfeeding, talk with your doctor about counseling or other ways to quit smoking. Do not take medicine to help you quit smoking unless your doctor tells you to do so. To quit smoking: Quit right away Quit smoking totally, instead of slowly cutting back on how much you smoke over a period of time. Go to counseling. You are more likely to quit if you go to counseling sessions regularly. Take medicine You may take  medicines to help you quit. Some medicines need a prescription, and some you can buy over-the-counter. Some medicines may contain a drug called nicotine to replace the nicotine in cigarettes. Medicines may: Help you to stop having the desire to smoke (cravings). Help to stop the problems that come when you stop smoking (withdrawal symptoms). Your doctor may ask you to use: Nicotine patches, gum, or lozenges. Nicotine inhalers or sprays. Non-nicotine medicine that is taken by mouth. Find resources Find resources and other ways to help you quit smoking and remain smoke-free after you quit. These resources are most helpful when you use them often. They include: Online chats with a Veterinary surgeon. Phone quitlines. Printed Materials engineer. Support groups or group counseling. Text messaging programs. Mobile phone apps. Use apps on your mobile phone or tablet that can help you stick to your quit plan. There are many free apps for mobile phones and tablets as well as  websites. Examples include Quit Guide from the [employer] and smokefree.gov  What things can I do to make it easier to quit?  Talk to your family and friends. Ask them to support and encourage you. Call a phone quitline (1-800-QUIT-NOW), reach out to support groups, or work with a Veterinary surgeon. Ask people who smoke to not smoke around you. Avoid places that make you want to smoke, such as: Bars. Parties. Smoke-break areas at work. Spend time with people who do not smoke. Lower the stress in your life. Stress can make you want to smoke. Try these things to help your stress: Getting regular exercise. Doing deep-breathing exercises. Doing yoga. Meditating. Doing a body scan. To do this, close your eyes, focus on one area of your body at a time from head to toe. Notice which parts of your body are tense. Try to relax the muscles in those areas. How will I feel when I quit smoking? Day 1 to 3 weeks Within the first 24 hours, you may start to have some problems that come from quitting tobacco. These problems are very bad 2-3 days after you quit, but they do not often last for more than 2-3 weeks. You may get these symptoms: Mood swings. Feeling restless, nervous, angry, or annoyed. Trouble concentrating. Dizziness. Strong desire for high-sugar foods and nicotine. Weight gain. Trouble pooping (constipation). Feeling like you may vomit (nausea). Coughing or a sore throat. Changes in how the medicines that you take for other issues work in your body. Depression. Trouble sleeping (insomnia). Week 3 and afterward After the first 2-3 weeks of quitting, you may start to notice more positive results, such as: Better sense of smell and taste. Less coughing and sore throat. Slower heart rate. Lower blood pressure. Clearer skin. Better breathing. Fewer sick days. Quitting smoking can be hard. Do not give up if you fail the first time. Some people need to try a few times before they succeed. Do your  best to stick to your quit plan, and talk with your doctor if you have any questions or concerns. Summary Smoking tobacco is the leading cause of preventable death. Quitting smoking can be hard, but it is one of the best things that you can do for your health. When you decide to quit smoking, make a plan to help you succeed. Quit smoking right away, not slowly over a period of time. When you start quitting, seek help from your doctor, family, or friends. This information is not intended to replace advice given to you by your health care provider. Make sure you  discuss any questions you have with your health care provider. Document Revised: 03/18/2019 Document Reviewed: 09/11/2018 Elsevier Patient Education  2022 [employer]

## 2021-03-15 LAB — CBC WITH DIFFERENTIAL/PLATELET
Basophils Absolute: 0 10*3/uL (ref 0.0–0.2)
Basos: 1 %
EOS (ABSOLUTE): 0.1 10*3/uL (ref 0.0–0.4)
Eos: 2 %
Hematocrit: 37 % — ABNORMAL LOW (ref 37.5–51.0)
Hemoglobin: 12.7 g/dL — ABNORMAL LOW (ref 13.0–17.7)
Immature Grans (Abs): 0 10*3/uL (ref 0.0–0.1)
Immature Granulocytes: 0 %
Lymphocytes Absolute: 1.7 10*3/uL (ref 0.7–3.1)
Lymphs: 36 %
MCH: 26.8 pg (ref 26.6–33.0)
MCHC: 34.3 g/dL (ref 31.5–35.7)
MCV: 78 fL — ABNORMAL LOW (ref 79–97)
Monocytes Absolute: 0.5 10*3/uL (ref 0.1–0.9)
Monocytes: 10 %
Neutrophils Absolute: 2.4 10*3/uL (ref 1.4–7.0)
Neutrophils: 51 %
Platelets: 235 10*3/uL (ref 150–450)
RBC: 4.74 x10E6/uL (ref 4.14–5.80)
RDW: 25.1 % — ABNORMAL HIGH (ref 11.6–15.4)
WBC: 4.7 10*3/uL (ref 3.4–10.8)

## 2021-03-15 LAB — HEPATITIS C ANTIBODY: Hep C Virus Ab: <0.1 s/co ratio (ref 0.0–0.9)

## 2021-03-17 ENCOUNTER — Other Ambulatory Visit: Payer: Self-pay | Admitting: Family Medicine

## 2021-03-19 ENCOUNTER — Telehealth: Payer: Self-pay

## 2021-03-19 NOTE — Telephone Encounter (Signed)
Patient left voicemail requesting additional relief for foot pain stated ibuprofen is not working.

## 2021-03-20 NOTE — Telephone Encounter (Signed)
Left voicemail for callback.

## 2021-03-20 NOTE — Telephone Encounter (Signed)
Patient notified to contact surgeon.

## 2021-03-26 ENCOUNTER — Other Ambulatory Visit: Payer: Self-pay

## 2021-03-26 ENCOUNTER — Encounter (HOSPITAL_BASED_OUTPATIENT_CLINIC_OR_DEPARTMENT_OTHER): Payer: 59 | Admitting: Internal Medicine

## 2021-03-26 DIAGNOSIS — S91302D Unspecified open wound, left foot, subsequent encounter: Secondary | ICD-10-CM | POA: Diagnosis not present

## 2021-03-26 DIAGNOSIS — I739 Peripheral vascular disease, unspecified: Secondary | ICD-10-CM | POA: Diagnosis not present

## 2021-03-26 DIAGNOSIS — S81802D Unspecified open wound, left lower leg, subsequent encounter: Secondary | ICD-10-CM

## 2021-03-26 DIAGNOSIS — E11621 Type 2 diabetes mellitus with foot ulcer: Secondary | ICD-10-CM

## 2021-03-26 NOTE — Progress Notes (Signed)
TEIGE, ROUNTREE ([id number]) Visit Report for 03/26/2021 Chief Complaint Document Details Patient Name: Date of Service: Tony Hicks [name] 03/26/2021 12:45 PM Medical Record Number: [id number] Patient Account Number: [id number] Date of Birth/Sex: Treating RN: 10-03-1965 (55 y.o. Tony Hicks Primary Care Provider: Thad Ranger Other Clinician: Referring Provider: Treating Provider/Extender: Bonna Gains, Amy Weeks in Treatment: 7 Information Obtained from: Patient Chief Complaint Left lower extremity wounds Electronic Signature(s) Signed: 03/26/2021 1:14:19 PM By: Geralyn Corwin DO Entered By: Geralyn Corwin on 03/26/2021 13:06:33 -------------------------------------------------------------------------------- HPI Details Patient Name: Date of Service: Tony Hicks, Tony Hicks 03/26/2021 12:45 PM Medical Record Number: [id number] Patient Account Number: [id number] Date of Birth/Sex: Treating RN: Nov 10, 1965 (55 y.o. Tony Hicks Primary Care Provider: Thad Ranger Other Clinician: Referring Provider: Treating Provider/Extender: Bonna Gains, Amy Weeks in Treatment: 7 History of Present Illness HPI Description: Admission 8/1 Tony Hicks is a 55 year old male with a past medical history of controlled type 2 diabetes on oral agents, polysubstance abuse (tobacco, alcohol, cocaine) and peripheral arterial disease that presents with 3 left lower extremity wounds. He states that the wound to his heel started out as dried cracking that eventually became infected and worsened. He was following podiatry for this issue. This wound has been ongoing for about 3 months. There are 2 other wounds to his lateral left leg that started out as an injury from hitting his bed post. He states this happened several weeks ago. He reports mild tenderness to the wound beds. He has been using Santyl prescribed to him on discharge from hospital admission. He has no issues or  complaints today. He denies signs of infection. 8/15; patient presents for 2-week follow-up. He states he is out of Santyl. He reports following up with vein and vascular. He reports pain to the wound sites although stable. He currently denies signs of infection. 8/22; patient presents for 1 week follow-up. He states he has been using Santyl daily to the wound beds. He continues to report pain throughout the wound sites. He denies increased warmth erythema or purulent drainage. Unfortunately patient was denied for care at the pain management clinic. He states he is following up with his primary care provider in the next few days. 9/6; patient presents for follow-up. He continues to use Santyl to the wound beds. He reports chronic pain to the wounds that is stable. He reports having a primary care appointment on October 12. He has no issues or complaints today. He overall feels well. 9/20; patient presents for follow-up. He has run out of [place] and is currently keeping the area covered. He reports improvement in pain. He has no issues or complaints today. He overall feels well. He states that he would like a letter reflecting that he is not able to work at this time T present in court. Furniture conservator/restorer) Signed: 03/26/2021 1:14:19 PM By: Geralyn Corwin DO Entered By: Geralyn Corwin on 03/26/2021 13:07:18 -------------------------------------------------------------------------------- Physical Exam Details Patient Name: Date of Service: Tony Hicks, Tony Hicks 03/26/2021 12:45 PM Medical Record Number: [id number] Patient Account Number: [id number] Date of Birth/Sex: Treating RN: 1965/11/09 (55 y.o. Tony Hicks Primary Care Provider: Thad Ranger Other Clinician: Referring Provider: Treating Provider/Extender: Bonna Gains, Amy Weeks in Treatment: 7 Constitutional respirations regular, non-labored and within target range for patient.. Cardiovascular 2+ dorsalis  pedis/posterior tibialis pulses. Psychiatric pleasant and cooperative. Notes Left lower extremity: 2 open wounds with granulation tissue present and circumferential callus. No signs of infection on exam.  Appears well-healing. Electronic Signature(s) Signed: 03/26/2021 1:14:19 PM By: Geralyn Corwin DO Entered By: Geralyn Corwin on 03/26/2021 13:09:37 -------------------------------------------------------------------------------- Physician Orders Details Patient Name: Date of Service: Tony Hicks, Tony Hicks 03/26/2021 12:45 PM Medical Record Number: [id number] Patient Account Number: [id number] Date of Birth/Sex: Treating RN: [birth year] (55 y.o. Tony Hicks Primary Care Provider: Thad Ranger Other Clinician: Referring Provider: Treating Provider/Extender: Bonna Gains, Amy Weeks in Treatment: 7 Verbal / Phone Orders: No Diagnosis Coding ICD-10 Coding Code Description S91.302D Unspecified open wound, left foot, subsequent encounter S81.802D Unspecified open wound, left lower leg, subsequent encounter I73.9 Peripheral vascular disease, unspecified E11.621 Type 2 diabetes mellitus with foot ulcer Follow-up Appointments ppointment in 2 weeks. - with Dr. Mikey Bussing Return A Bathing/ Shower/ Hygiene May shower and wash wound with soap and water. - prior to dressing change Edema Control - Lymphedema / SCD / Other Elevate legs to the level of the heart or above for 30 minutes daily and/or when sitting, a frequency of: - throughout the day Avoid standing for long periods of time. Additional Orders / Instructions Follow Nutritious Diet - 100-120g of protein daily. Other: - Patient unable to work at this time due to wounds to left foot and leg. Reevaluating wound and treatment plan every two weeks. Wound Treatment Wound #2 - Lower Leg Wound Laterality: Left, Lateral Cleanser: Soap and Water Every Other Day/15 Days Discharge Instructions: May shower and wash wound with dial  antibacterial soap and water prior to dressing change. Prim Dressing: KerraCel Ag Gelling Fiber Dressing, 4x5 in (silver alginate) Every Other Day/15 Days ary Discharge Instructions: Apply silver alginate to wound bed as instructed Secondary Dressing: Woven Gauze Sponge, Non-Sterile 4x4 in Every Other Day/15 Days Discharge Instructions: Apply over primary dressing as directed. Secondary Dressing: ABD Pad, 5x9 Every Other Day/15 Days Discharge Instructions: Apply over primary dressing as directed. Secured With: [employer], 4.5x3.1 (in/yd) Every Other Day/15 Days Discharge Instructions: Secure with Kerlix as directed. Secured With: 68M Medipore H Soft Cloth Surgical Tape, 2x2 (in/yd) Every Other Day/15 Days Discharge Instructions: Secure dressing with tape as directed. Wound #3 - Calcaneus Wound Laterality: Left Cleanser: Soap and Water Every Other Day/15 Days Discharge Instructions: May shower and wash wound with dial antibacterial soap and water prior to dressing change. Prim Dressing: KerraCel Ag Gelling Fiber Dressing, 4x5 in (silver alginate) Every Other Day/15 Days ary Discharge Instructions: Apply silver alginate to wound bed as instructed Secondary Dressing: Woven Gauze Sponge, Non-Sterile 4x4 in Every Other Day/15 Days Discharge Instructions: Apply over primary dressing as directed. Secondary Dressing: ABD Pad, 5x9 Every Other Day/15 Days Discharge Instructions: Apply over primary dressing as directed. Secured With: [employer], 4.5x3.1 (in/yd) Every Other Day/15 Days Discharge Instructions: Secure with Kerlix as directed. Secured With: 68M Medipore H Soft Cloth Surgical Tape, 2x2 (in/yd) Every Other Day/15 Days Discharge Instructions: Secure dressing with tape as directed. Electronic Signature(s) Signed: 03/26/2021 1:14:19 PM By: Geralyn Corwin DO Entered By: Geralyn Corwin on 03/26/2021  13:10:10 -------------------------------------------------------------------------------- Problem List Details Patient Name: Date of Service: Tony Hicks, Tony Hicks 03/26/2021 12:45 PM Medical Record Number: [id number] Patient Account Number: [id number] Date of Birth/Sex: Treating RN: 09/10/1965 (55 y.o. Tony Hicks Primary Care Provider: Thad Ranger Other Clinician: Referring Provider: Treating Provider/Extender: Bonna Gains, Amy Weeks in Treatment: 7 Active Problems ICD-10 Encounter Code Description Active Date MDM Diagnosis S91.302D Unspecified open wound, left foot, subsequent encounter 02/25/2021 No Yes S81.802D Unspecified open wound, left lower leg, subsequent encounter  02/25/2021 No Yes I73.9 Peripheral vascular disease, unspecified 02/04/2021 No Yes E11.621 Type 2 diabetes mellitus with foot ulcer 02/04/2021 No Yes Inactive Problems ICD-10 Code Description Active Date Inactive Date S91.302A Unspecified open wound, left foot, initial encounter 02/04/2021 02/04/2021 D32.202R Unspecified open wound, left lower leg, initial encounter 02/04/2021 02/04/2021 Resolved Problems Electronic Signature(s) Signed: 03/26/2021 1:14:19 PM By: Geralyn Corwin DO Entered By: Geralyn Corwin on 03/26/2021 13:06:18 -------------------------------------------------------------------------------- Progress Note Details Patient Name: Date of Service: Tony Hicks, Tony Hicks 03/26/2021 12:45 PM Medical Record Number: [id number] Patient Account Number: [id number] Date of Birth/Sex: Treating RN: 11-Nov-1965 (55 y.o. Tony Hicks Primary Care Provider: Thad Ranger Other Clinician: Referring Provider: Treating Provider/Extender: Bonna Gains, Amy Weeks in Treatment: 7 Subjective Chief Complaint Information obtained from Patient Left lower extremity wounds History of Present Illness (HPI) Admission 8/1 Mr. Kanye Depree is a 55 year old male with a past medical history of  controlled type 2 diabetes on oral agents, polysubstance abuse (tobacco, alcohol, cocaine) and peripheral arterial disease that presents with 3 left lower extremity wounds. He states that the wound to his heel started out as dried cracking that eventually became infected and worsened. He was following podiatry for this issue. This wound has been ongoing for about 3 months. There are 2 other wounds to his lateral left leg that started out as an injury from hitting his bed post. He states this happened several weeks ago. He reports mild tenderness to the wound beds. He has been using Santyl prescribed to him on discharge from hospital admission. He has no issues or complaints today. He denies signs of infection. 8/15; patient presents for 2-week follow-up. He states he is out of Santyl. He reports following up with vein and vascular. He reports pain to the wound sites although stable. He currently denies signs of infection. 8/22; patient presents for 1 week follow-up. He states he has been using Santyl daily to the wound beds. He continues to report pain throughout the wound sites. He denies increased warmth erythema or purulent drainage. Unfortunately patient was denied for care at the pain management clinic. He states he is following up with his primary care provider in the next few days. 9/6; patient presents for follow-up. He continues to use Santyl to the wound beds. He reports chronic pain to the wounds that is stable. He reports having a primary care appointment on October 12. He has no issues or complaints today. He overall feels well. 9/20; patient presents for follow-up. He has run out of [place] and is currently keeping the area covered. He reports improvement in pain. He has no issues or complaints today. He overall feels well. He states that he would like a letter reflecting that he is not able to work at this time T present in court. o Patient History Information obtained from  Patient. Family History Unknown History. Social History Current every day smoker - 1 cigarette per day, Marital Status - Single, Alcohol Use - Moderate - Beer, Drug Use - Prior History - Cocaine, Caffeine Use - Rarely. Medical History Cardiovascular Patient has history of Peripheral Arterial Disease Endocrine Patient has history of Type II Diabetes Neurologic Patient has history of Neuropathy Objective Constitutional respirations regular, non-labored and within target range for patient.. Vitals Time Taken: 12:43 PM, Height: 67 in, Weight: 190 lbs, BMI: 29.8, Temperature: 98.5 F, Pulse: 80 bpm, Respiratory Rate: 20 breaths/min, Blood Pressure: 129/83 mmHg. Cardiovascular 2+ dorsalis pedis/posterior tibialis pulses. Psychiatric pleasant and cooperative. General Notes: Left lower extremity:  2 open wounds with granulation tissue present and circumferential callus. No signs of infection on exam. Appears well- healing. Integumentary (Hair, Skin) Wound #2 status is Open. Original cause of wound was Gradually Appeared. The date acquired was: 11/04/2020. The wound has been in treatment 7 weeks. The wound is located on the Left,Lateral Lower Leg. The wound measures 0.9cm length x 0.8cm width x 0.1cm depth; 0.565cm^2 area and 0.057cm^3 volume. There is Fat Layer (Subcutaneous Tissue) exposed. There is no tunneling or undermining noted. There is a medium amount of serosanguineous drainage noted. The wound margin is flat and intact. There is large (67-100%) red granulation within the wound bed. There is no necrotic tissue within the wound bed. Wound #3 status is Open. Original cause of wound was Gradually Appeared. The date acquired was: 11/04/2020. The wound has been in treatment 7 weeks. The wound is located on the Left Calcaneus. The wound measures 1cm length x 1.2cm width x 0.1cm depth; 0.942cm^2 area and 0.094cm^3 volume. There is Fat Layer (Subcutaneous Tissue) exposed. There is no tunneling or  undermining noted. There is a medium amount of serosanguineous drainage noted. The wound margin is flat and intact. There is large (67-100%) red, pink granulation within the wound bed. There is no necrotic tissue within the wound bed. Assessment Active Problems ICD-10 Unspecified open wound, left foot, subsequent encounter Unspecified open wound, left lower leg, subsequent encounter Peripheral vascular disease, unspecified Type 2 diabetes mellitus with foot ulcer Patient's wounds have improved greatly since last clinic visit. He cannot afford the Santyl ointment and I recommended using silver alginate daily to the wound beds. He used to work at [employer] and was standing for long periods of time. At this time I think it is reasonable that the patient stay off his feet until these are healed As standing for long periods of time could delay wound healing. We will give him a note requesting that he not work currently We will reassess every 2 weeks. I think once these are closed it would be reasonable for him to go back to work. No signs of infection. Plan Follow-up Appointments: Return Appointment in 2 weeks. - with Dr. Mikey Bussing Bathing/ Shower/ Hygiene: May shower and wash wound with soap and water. - prior to dressing change Edema Control - Lymphedema / SCD / Other: Elevate legs to the level of the heart or above for 30 minutes daily and/or when sitting, a frequency of: - throughout the day Avoid standing for long periods of time. Additional Orders / Instructions: Follow Nutritious Diet - 100-120g of protein daily. Other: - Patient unable to work at this time due to wounds to left foot and leg. Reevaluating wound and treatment plan every two weeks. WOUND #2: - Lower Leg Wound Laterality: Left, Lateral Cleanser: Soap and Water Every Other Day/15 Days Discharge Instructions: May shower and wash wound with dial antibacterial soap and water prior to dressing change. Prim Dressing: KerraCel Ag  Gelling Fiber Dressing, 4x5 in (silver alginate) Every Other Day/15 Days ary Discharge Instructions: Apply silver alginate to wound bed as instructed Secondary Dressing: Woven Gauze Sponge, Non-Sterile 4x4 in Every Other Day/15 Days Discharge Instructions: Apply over primary dressing as directed. Secondary Dressing: ABD Pad, 5x9 Every Other Day/15 Days Discharge Instructions: Apply over primary dressing as directed. Secured With: [employer], 4.5x3.1 (in/yd) Every Other Day/15 Days Discharge Instructions: Secure with Kerlix as directed. Secured With: 36M Medipore H Soft Cloth Surgical T ape, 2x2 (in/yd) Every Other Day/15 Days Discharge Instructions: Secure  dressing with tape as directed. WOUND #3: - Calcaneus Wound Laterality: Left Cleanser: Soap and Water Every Other Day/15 Days Discharge Instructions: May shower and wash wound with dial antibacterial soap and water prior to dressing change. Prim Dressing: KerraCel Ag Gelling Fiber Dressing, 4x5 in (silver alginate) Every Other Day/15 Days ary Discharge Instructions: Apply silver alginate to wound bed as instructed Secondary Dressing: Woven Gauze Sponge, Non-Sterile 4x4 in Every Other Day/15 Days Discharge Instructions: Apply over primary dressing as directed. Secondary Dressing: ABD Pad, 5x9 Every Other Day/15 Days Discharge Instructions: Apply over primary dressing as directed. Secured With: [employer], 4.5x3.1 (in/yd) Every Other Day/15 Days Discharge Instructions: Secure with Kerlix as directed. Secured With: 19M Medipore H Soft Cloth Surgical T ape, 2x2 (in/yd) Every Other Day/15 Days Discharge Instructions: Secure dressing with tape as directed. 1. Silver alginate 2. Note requesting to be out of work 3. Follow-up in 2 weeks Electronic Signature(s) Signed: 03/26/2021 1:14:19 PM By: Geralyn Corwin DO Entered By: Geralyn Corwin on 03/26/2021  13:13:44 -------------------------------------------------------------------------------- HxROS Details Patient Name: Date of Service: Tony Hicks, Tony Hicks 03/26/2021 12:45 PM Medical Record Number: [id number] Patient Account Number: [id number] Date of Birth/Sex: Treating RN: [birth year] (55 y.o. Tony Hicks Primary Care Provider: Thad Ranger Other Clinician: Referring Provider: Treating Provider/Extender: Bonna Gains, Amy Weeks in Treatment: 7 Information Obtained From Patient Cardiovascular Medical History: Positive for: Peripheral Arterial Disease Endocrine Medical History: Positive for: Type II Diabetes Time with diabetes: 1 year Treated with: Oral agents Blood sugar tested every day: No Neurologic Medical History: Positive for: Neuropathy Immunizations Pneumococcal Vaccine: Received Pneumococcal Vaccination: No Implantable Devices None Family and Social History Unknown History: Yes; Current every day smoker - 1 cigarette per day; Marital Status - Single; Alcohol Use: Moderate - Beer; Drug Use: Prior History - Cocaine; Caffeine Use: Rarely; Financial Concerns: No; Food, Clothing or Shelter Needs: No; Support System Lacking: No; Transportation Concerns: No Electronic Signature(s) Signed: 03/26/2021 1:14:19 PM By: Geralyn Corwin DO Signed: 03/26/2021 5:49:24 PM By: Shawn Stall RN, BSN Entered By: Geralyn Corwin on 03/26/2021 13:07:30 -------------------------------------------------------------------------------- SuperBill Details Patient Name: Date of Service: Tony Hicks, Tony Hicks 03/26/2021 Medical Record Number: [id number] Patient Account Number: [id number] Date of Birth/Sex: Treating RN: May 19, 1966 (55 y.o. Tony Hicks Primary Care Provider: Thad Ranger Other Clinician: Referring Provider: Treating Provider/Extender: Bonna Gains, Amy Weeks in Treatment: 7 Diagnosis Coding ICD-10 Codes Code Description S91.302D  Unspecified open wound, left foot, subsequent encounter S81.802D Unspecified open wound, left lower leg, subsequent encounter I73.9 Peripheral vascular disease, unspecified E11.621 Type 2 diabetes mellitus with foot ulcer Facility Procedures CPT4 Code: [id number] Description: 99214 - WOUND CARE VISIT-LEV 4 EST PT Modifier: Quantity: 1 Physician Procedures : CPT4 Code Description Modifier [id number] - WC PHYS LEVEL 3 - EST PT ICD-10 Diagnosis Description S91.302D Unspecified open wound, left foot, subsequent encounter S81.802D Unspecified open wound, left lower leg, subsequent encounter I73.9 Peripheral  vascular disease, unspecified E11.621 Type 2 diabetes mellitus with foot ulcer Quantity: 1 Electronic Signature(s) Signed: 03/26/2021 1:14:19 PM By: Geralyn Corwin DO Entered By: Geralyn Corwin on 03/26/2021 13:14:00

## 2021-03-26 NOTE — Progress Notes (Signed)
AURON, TADROS ([id number]) Visit Report for 03/26/2021 Arrival Information Details Patient Name: Date of Service: Tony Hicks 03/26/2021 12:45 PM Medical Record Number: [id number] Patient Account Number: [id number] Date of Birth/Sex: Treating RN: [birth year] (55 y.o. Tony Hicks Primary Care Tony Hicks: Tony Hicks Other Clinician: Referring Tony Hicks: Treating Tony Hicks/Extender: Tony Hicks, Tony Hicks in Treatment: 7 Visit Information History Since Last Visit Added or deleted any medications: No Patient Arrived: Crutches Any new allergies or adverse reactions: No Arrival Time: 12:42 Had a fall or experienced change in No Accompanied By: self activities of daily living that may affect Transfer Assistance: None risk of falls: Patient Identification Verified: Yes Signs or symptoms of abuse/neglect since last visito No Secondary Verification Process Completed: Yes Hospitalized since last visit: No Patient Requires Transmission-Based Precautions: No Implantable device outside of the clinic excluding No Patient Has Alerts: Yes cellular tissue based products placed in the center Patient Alerts: L ABI: 0.36 TBI: 0 since last visit: Has Dressing in Place as Prescribed: Yes Pain Present Now: Yes Electronic Signature(s) Signed: 03/26/2021 4:02:45 PM By: Tony Hicks Entered By: Tony Hicks on 03/26/2021 12:43:22 -------------------------------------------------------------------------------- Clinic Level of Care Assessment Details Patient Name: Date of Service: Tony Hicks RV EY 03/26/2021 12:45 PM Medical Record Number: [id number] Patient Account Number: [id number] Date of Birth/Sex: Treating RN: August 25, 1965 (55 y.o. Tony Hicks Primary Care Kailene Steinhart: Tony Hicks Other Clinician: Referring Rehanna Oloughlin: Treating Tony Hicks: Tony Hicks, Tony Hicks in Treatment: 7 Clinic Level of Care Assessment Items TOOL 4 Quantity Score X- 1  0 Use when only an EandM is performed on FOLLOW-UP visit ASSESSMENTS - Nursing Assessment / Reassessment X- 1 10 Reassessment of Co-morbidities (includes updates in patient status) X- 1 5 Reassessment of Adherence to Treatment Plan ASSESSMENTS - Wound and Skin A ssessment / Reassessment []  - 0 Simple Wound Assessment / Reassessment - one wound X- 2 5 Complex Wound Assessment / Reassessment - multiple wounds X- 1 10 Dermatologic / Skin Assessment (not related to wound area) ASSESSMENTS - Focused Assessment X- 1 5 Circumferential Edema Measurements - multi extremities X- 1 10 Nutritional Assessment / Counseling / Intervention []  - 0 Lower Extremity Assessment (monofilament, tuning fork, pulses) []  - 0 Peripheral Arterial Disease Assessment (using hand held doppler) ASSESSMENTS - Ostomy and/or Continence Assessment and Care []  - 0 Incontinence Assessment and Management []  - 0 Ostomy Care Assessment and Management (repouching, etc.) PROCESS - Coordination of Care []  - 0 Simple Patient / Family Education for ongoing care X- 1 20 Complex (extensive) Patient / Family Education for ongoing care X- 1 10 Staff obtains , Records, T Results / Process Orders est []  - 0 Staff telephones HHA, Nursing Homes / Clarify orders / etc []  - 0 Routine Transfer to another Facility (non-emergent condition) []  - 0 Routine Hospital Admission (non-emergent condition) []  - 0 New Admissions / / Ordering NPWT Apligraf, etc. , []  - 0 Emergency Hospital Admission (emergent condition) []  - 0 Simple Discharge Coordination X- 1 15 Complex (extensive) Discharge Coordination PROCESS - Special Needs []  - 0 Pediatric / Minor Patient Management []  - 0 Isolation Patient Management []  - 0 Hearing / Language / Visual special needs []  - 0 Assessment of Community assistance (transportation, D/C planning, etc.) []  - 0 Additional assistance / Altered mentation []  -  0 Support Surface(s) Assessment (bed, cushion, seat, etc.) INTERVENTIONS - Wound Cleansing / Measurement []  - 0 Simple Wound Cleansing - one wound X- 2 5 Complex Wound Cleansing -  multiple wounds X- 1 5 Wound Imaging (photographs - any number of wounds) []  - 0 Wound Tracing (instead of photographs) []  - 0 Simple Wound Measurement - one wound X- 2 5 Complex Wound Measurement - multiple wounds INTERVENTIONS - Wound Dressings []  - 0 Small Wound Dressing one or multiple wounds X- 1 15 Medium Wound Dressing one or multiple wounds []  - 0 Large Wound Dressing one or multiple wounds []  - 0 Application of Medications - topical []  - 0 Application of Medications - injection INTERVENTIONS - Miscellaneous []  - 0 External ear exam []  - 0 Specimen Collection (cultures, biopsies, blood, body fluids, etc.) []  - 0 Specimen(s) / Culture(s) sent or taken to Lab for analysis []  - 0 Patient Transfer (multiple staff / / Similar devices) []  - 0 Simple Staple / Suture removal (25 or less) []  - 0 Complex Staple / Suture removal (26 or more) []  - 0 Hypo / Hyperglycemic Management (close monitor of Blood Glucose) []  - 0 Ankle / Brachial Index (ABI) - do not check if billed separately X- 1 5 Vital Signs Has the patient been seen at the hospital within the last three years: Yes Total Score: 140 Level Of Care: New/Established - Level 4 Electronic Signature(s) Signed: 03/26/2021 5:49:24 PM By: RN, BSN Entered By: on 03/26/2021 13:06:19 -------------------------------------------------------------------------------- Lower Extremity Assessment Details Patient Name: Date of Service: [name], [name] 03/26/2021 12:45 PM Medical Record Number: Patient Account Number: Date of Birth/Sex: Treating RN: [birth year] (55 y.o. Nurse, adult Primary Care Tony Hicks: Other Clinician: Referring Tony Hicks: Treating Tony Hicks/Extender:  , Tony Hicks in Treatment: 7 Edema Assessment Assessed: [Left: Yes] [Right: No] Edema: [Left: Ye] [Right: s] Calf Left: Right: Point of Measurement: 34 cm From Medial Instep 38 cm Ankle Left: Right: Point of Measurement: 11 cm From Medial Instep 26 cm Vascular Assessment Pulses: Dorsalis Pedis Palpable: [Left:Yes] Electronic Signature(s) Signed: 03/26/2021 5:49:24 PM By: RN, BSN Entered By: 03/28/2021 on 03/26/2021 12:59:47 -------------------------------------------------------------------------------- Multi Wound Chart Details Patient Name: Date of Service: Shawn Stall, [name] 03/26/2021 12:45 PM Medical Record Number: 03/28/2021 Patient Account Number: [id number] Date of Birth/Sex: Treating RN: Nov 14, 1965 (56 y.o. 57, Tony Hicks Primary Care Allina Riches: Tony Hicks Other Clinician: Referring Otha Rickles: Treating Alaysiah Browder/Extender: Tony Hicks, Tony Hicks in Treatment: 7 Vital Signs Height(in): 67 Pulse(bpm): 80 Weight(lbs): 190 Blood Pressure(mmHg): 129/83 Body Mass Index(BMI): 30 Temperature(F): 98.5 Respiratory Rate(breaths/min): 20 Photos: [N/A:N/A] Left, Lateral Lower Leg Left Calcaneus N/A Wound Location: Gradually Appeared Gradually Appeared N/A Wounding Event: Arterial Insufficiency Ulcer Diabetic Wound/Ulcer of the Lower N/A Primary Etiology: Extremity N/A Arterial Insufficiency Ulcer N/A Secondary Etiology: Peripheral Arterial Disease, Type II Peripheral Arterial Disease, Type II N/A Comorbid History: Diabetes, Neuropathy Diabetes, Neuropathy 11/04/2020 11/04/2020 N/A Date Acquired: 7 7 N/A Hicks of Treatment: Open Open N/A Wound Status: 0.9x0.8x0.1 1x1.2x0.1 N/A Measurements L x W x D (cm) 0.565 0.942 N/A A (cm) : rea 0.057 0.094 N/A Volume (cm) : 95.60% 95.10% N/A % Reduction in Area: 95.60% 98.40% N/A % Reduction in Volume: Full Thickness Without Exposed Grade 2  N/A Classification: Support Structures Medium Medium N/A Exudate Amount: Serosanguineous Serosanguineous N/A Exudate Type: red, brown red, brown N/A Exudate Color: Flat and Intact Flat and Intact N/A Wound Margin: Large (67-100%) Large (67-100%) N/A Granulation Amount: Red Red, Pink N/A Granulation Quality: None Present (0%) None Present (0%) N/A Necrotic Amount: Fat Layer (Subcutaneous Tissue): Yes Fat Layer (  Subcutaneous Tissue): Yes N/A Exposed Structures: Fascia: No Fascia: No Tendon: No Tendon: No Muscle: No Muscle: No Joint: No Joint: No Bone: No Bone: No Small (1-33%) Large (67-100%) N/A Epithelialization: Treatment Notes Electronic Signature(s) Signed: 03/26/2021 1:14:19 PM By: Geralyn Corwin DO Signed: 03/26/2021 5:49:24 PM By: Shawn Stall RN, BSN Entered By: Geralyn Corwin on 03/26/2021 13:06:25 -------------------------------------------------------------------------------- Multi-Disciplinary Care Plan Details Patient Name: Date of Service: [name], [name] 03/26/2021 12:45 PM Medical Record Number: [id number] Patient Account Number: [id number] Date of Birth/Sex: Treating RN: Dec 24, 1965 (55 y.o. Tony Hicks Primary Care Hudsyn Barich: Tony Hicks Other Clinician: Referring Ambert Virrueta: Treating Vencil Basnett/Extender: Cheri Rous in Treatment: 7 Multidisciplinary Care Plan reviewed with physician Active Inactive Nutrition Nursing Diagnoses: Impaired glucose control: actual or potential Potential for alteratiion in Nutrition/Potential for imbalanced nutrition Goals: Patient/caregiver agrees to and verbalizes understanding of need to use nutritional supplements and/or vitamins as prescribed Date Initiated: 02/04/2021 Target Resolution Date: 05/03/2021 Goal Status: Active Patient/caregiver will maintain therapeutic glucose control Date Initiated: 02/04/2021 Target Resolution Date: 04/25/2021 Goal Status:  Active Interventions: Assess HgA1c results as ordered upon admission and as needed Assess patient nutrition upon admission and as needed per policy Provide education on elevated blood sugars and impact on wound healing Provide education on nutrition Treatment Activities: Education provided on Nutrition : 02/04/2021 Notes: Wound/Skin Impairment Nursing Diagnoses: Impaired tissue integrity Knowledge deficit related to smoking impact on wound healing Knowledge deficit related to ulceration/compromised skin integrity Goals: Patient will demonstrate a reduced rate of smoking or cessation of smoking Date Initiated: 02/04/2021 Target Resolution Date: 04/26/2021 Goal Status: Active Patient/caregiver will verbalize understanding of skin care regimen Date Initiated: 02/04/2021 Target Resolution Date: 04/11/2021 Goal Status: Active Interventions: Assess patient/caregiver ability to obtain necessary supplies Assess patient/caregiver ability to perform ulcer/skin care regimen upon admission and as needed Assess ulceration(s) every visit Provide education on smoking Provide education on ulcer and skin care Notes: Electronic Signature(s) Signed: 03/26/2021 5:49:24 PM By: Shawn Stall RN, BSN Entered By: Shawn Stall on 03/26/2021 13:02:28 -------------------------------------------------------------------------------- Pain Assessment Details Patient Name: Date of Service: [name], [name] 03/26/2021 12:45 PM Medical Record Number: [id number] Patient Account Number: [id number] Date of Birth/Sex: Treating RN: [birth year] (55 y.o. Tony Hicks Primary Care Dashanna Kinnamon: Tony Hicks Other Clinician: Referring Byrd Terrero: Treating Analyce Tavares/Extender: Tony Hicks, Tony Hicks in Treatment: 7 Active Problems Location of Pain Severity and Description of Pain Patient Has Paino Yes Site Locations Pain Management and Medication Current Pain Management: Electronic Signature(s) Signed:  03/26/2021 4:02:45 PM By: Tony Hicks Signed: 03/26/2021 5:49:24 PM By: Shawn Stall RN, BSN Entered By: Tony Hicks on 03/26/2021 12:44:29 -------------------------------------------------------------------------------- Patient/Caregiver Education Details Patient Name: Date of Service: Tony Hicks 9/20/2022andnbsp12:45 PM Medical Record Number: [id number] Patient Account Number: [id number] Date of Birth/Gender: Treating RN: Jul 12, 1965 (55 y.o. Tony Hicks Primary Care Physician: Tony Hicks Other Clinician: Referring Physician: Treating Physician/Extender: Cheri Rous in Treatment: 7 Education Assessment Education Provided To: Patient Education Topics Provided Elevated Blood Sugar/ Impact on Healing: Handouts: Elevated Blood Sugars: How Do They Affect Wound Healing Methods: Explain/Verbal Responses: Reinforcements needed Electronic Signature(s) Signed: 03/26/2021 5:49:24 PM By: Shawn Stall RN, BSN Entered By: Shawn Stall on 03/26/2021 13:02:43 -------------------------------------------------------------------------------- Wound Assessment Details Patient Name: Date of Service: [name], [name] 03/26/2021 12:45 PM Medical Record Number: [id number] Patient Account Number: [id number] Date of Birth/Sex: Treating RN: 02-14-1966 (55 y.o. Tony Hicks Primary Care Mao Lockner: Tony Hicks Other Clinician: Referring Mikah Rottinghaus:  Treating Veatrice Eckstein/Extender: Tony Hicks, Tony Hicks in Treatment: 7 Wound Status Wound Number: 2 Primary Etiology: Arterial Insufficiency Ulcer Wound Location: Left, Lateral Lower Leg Wound Status: Open Wounding Event: Gradually Appeared Comorbid History: Peripheral Arterial Disease, Type II Diabetes, Neuropathy Date Acquired: 11/04/2020 Hicks Of Treatment: 7 Clustered Wound: No Photos Wound Measurements Length: (cm) 0.9 Width: (cm) 0.8 Depth: (cm) 0.1 Area: (cm) 0.565 Volume: (cm)  0.057 % Reduction in Area: 95.6% % Reduction in Volume: 95.6% Epithelialization: Small (1-33%) Tunneling: No Undermining: No Wound Description Classification: Full Thickness Without Exposed Support Structures Wound Margin: Flat and Intact Exudate Amount: Medium Exudate Type: Serosanguineous Exudate Color: red, brown Foul Odor After Cleansing: No Slough/Fibrino No Wound Bed Granulation Amount: Large (67-100%) Exposed Structure Granulation Quality: Red Fascia Exposed: No Necrotic Amount: None Present (0%) Fat Layer (Subcutaneous Tissue) Exposed: Yes Tendon Exposed: No Muscle Exposed: No Joint Exposed: No Bone Exposed: No Electronic Signature(s) Signed: 03/26/2021 5:49:24 PM By: Shawn Stall RN, BSN Entered By: Shawn Stall on 03/26/2021 13:00:48 -------------------------------------------------------------------------------- Wound Assessment Details Patient Name: Date of Service: [name], [name] 03/26/2021 12:45 PM Medical Record Number: [id number] Patient Account Number: [id number] Date of Birth/Sex: Treating RN: [birth year] (55 y.o. Harlon Flor, Yvonne Kendall Primary Care Laiden Milles: Tony Hicks Other Clinician: Referring Maximilian Tallo: Treating Dorianne Perret/Extender: Tony Hicks, Tony Hicks in Treatment: 7 Wound Status Wound Number: 3 Primary Etiology: Diabetic Wound/Ulcer of the Lower Extremity Wound Location: Left Calcaneus Secondary Etiology: Arterial Insufficiency Ulcer Wounding Event: Gradually Appeared Wound Status: Open Date Acquired: 11/04/2020 Comorbid History: Peripheral Arterial Disease, Type II Diabetes, Neuropathy Hicks Of Treatment: 7 Clustered Wound: No Photos Wound Measurements Length: (cm) 1 Width: (cm) 1.2 Depth: (cm) 0.1 Area: (cm) 0.942 Volume: (cm) 0.094 % Reduction in Area: 95.1% % Reduction in Volume: 98.4% Epithelialization: Large (67-100%) Tunneling: No Undermining: No Wound Description Classification: Grade 2 Wound Margin: Flat and  Intact Exudate Amount: Medium Exudate Type: Serosanguineous Exudate Color: red, brown Foul Odor After Cleansing: No Slough/Fibrino No Wound Bed Granulation Amount: Large (67-100%) Exposed Structure Granulation Quality: Red, Pink Fascia Exposed: No Necrotic Amount: None Present (0%) Fat Layer (Subcutaneous Tissue) Exposed: Yes Tendon Exposed: No Muscle Exposed: No Joint Exposed: No Bone Exposed: No Electronic Signature(s) Signed: 03/26/2021 5:49:24 PM By: Shawn Stall RN, BSN Entered By: Shawn Stall on 03/26/2021 13:01:03 -------------------------------------------------------------------------------- Vitals Details Patient Name: Date of Service: [name], [name] 03/26/2021 12:45 PM Medical Record Number: [id number] Patient Account Number: [id number] Date of Birth/Sex: Treating RN: 08-Apr-1966 (55 y.o. Tony Hicks Primary Care Jestina Stephani: Tony Hicks Other Clinician: Referring Ahnika Hannibal: Treating Autrey Human/Extender: Tony Hicks, Tony Hicks in Treatment: 7 Vital Signs Time Taken: 12:43 Temperature (F): 98.5 Height (in): 67 Pulse (bpm): 80 Weight (lbs): 190 Respiratory Rate (breaths/min): 20 Body Mass Index (BMI): 29.8 Blood Pressure (mmHg): 129/83 Reference Range: 80 - 120 mg / dl Electronic Signature(s) Signed: 03/26/2021 4:02:45 PM By: Tony Hicks Entered By: Tony Hicks on 03/26/2021 12:43:46

## 2021-04-08 ENCOUNTER — Other Ambulatory Visit: Payer: Self-pay

## 2021-04-08 ENCOUNTER — Other Ambulatory Visit (HOSPITAL_COMMUNITY): Payer: Self-pay

## 2021-04-08 ENCOUNTER — Ambulatory Visit: Payer: Self-pay

## 2021-04-08 ENCOUNTER — Encounter (HOSPITAL_COMMUNITY): Payer: Self-pay

## 2021-04-09 ENCOUNTER — Other Ambulatory Visit: Payer: Self-pay

## 2021-04-09 ENCOUNTER — Encounter (HOSPITAL_BASED_OUTPATIENT_CLINIC_OR_DEPARTMENT_OTHER): Payer: 59 | Attending: Internal Medicine | Admitting: Internal Medicine

## 2021-04-09 DIAGNOSIS — W2203XD Walked into furniture, subsequent encounter: Secondary | ICD-10-CM | POA: Diagnosis not present

## 2021-04-09 DIAGNOSIS — I739 Peripheral vascular disease, unspecified: Secondary | ICD-10-CM | POA: Insufficient documentation

## 2021-04-09 DIAGNOSIS — S81802D Unspecified open wound, left lower leg, subsequent encounter: Secondary | ICD-10-CM | POA: Diagnosis not present

## 2021-04-09 DIAGNOSIS — S91302D Unspecified open wound, left foot, subsequent encounter: Secondary | ICD-10-CM | POA: Insufficient documentation

## 2021-04-09 DIAGNOSIS — E11621 Type 2 diabetes mellitus with foot ulcer: Secondary | ICD-10-CM

## 2021-04-09 DIAGNOSIS — E1151 Type 2 diabetes mellitus with diabetic peripheral angiopathy without gangrene: Secondary | ICD-10-CM | POA: Insufficient documentation

## 2021-04-09 NOTE — Progress Notes (Signed)
DEEPAK, MOOK ([id number]) Visit Report for 04/09/2021 Chief Complaint Document Details Patient Name: Date of Service: Tony Hicks RV EY 04/09/2021 12:30 PM Medical Record Number: [id number] Patient Account Number: [id number] Date of Birth/Sex: Treating RN: [birth year] (55 y.o. [name] Primary Care Provider: Thad Ranger Other Clinician: Referring Provider: Treating Provider/Extender: Vanita Panda in Treatment: 9 Information Obtained from: Patient Chief Complaint Left lower extremity wounds Electronic Signature(s) Signed: 04/09/2021 1:55:13 PM By: Geralyn Corwin DO Entered By: Geralyn Corwin on 04/09/2021 13:45:18 -------------------------------------------------------------------------------- HPI Details Patient Name: Date of Service: [name], [name] 04/09/2021 12:30 PM Medical Record Number: [id number] Patient Account Number: [id number] Date of Birth/Sex: Treating RN: 1966-04-05 (54 y.o. [name] Primary Care Provider: Thad Ranger Other Clinician: Referring Provider: Treating Provider/Extender: Vanita Panda in Treatment: 9 History of Present Illness HPI Description: Admission 8/1 [name] is a 55 year old male with a past medical history of controlled type 2 diabetes on oral agents, polysubstance abuse (tobacco, alcohol, cocaine) and peripheral arterial disease that presents with 3 left lower extremity wounds. He states that the wound to his heel started out as dried cracking that eventually became infected and worsened. He was following podiatry for this issue. This wound has been ongoing for about 3 months. There are 2 other wounds to his lateral left leg that started out as an injury from hitting his bed post. He states this happened several weeks ago. He reports mild tenderness to the wound beds. He has been using Santyl prescribed to him on discharge from hospital admission. He has no issues or  complaints today. He denies signs of infection. 8/15; patient presents for 2-week follow-up. He states he is out of Santyl. He reports following up with vein and vascular. He reports pain to the wound sites although stable. He currently denies signs of infection. 8/22; patient presents for 1 week follow-up. He states he has been using Santyl daily to the wound beds. He continues to report pain throughout the wound sites. He denies increased warmth erythema or purulent drainage. Unfortunately patient was denied for care at the pain management clinic. He states he is following up with his primary care provider in the next few days. 9/6; patient presents for follow-up. He continues to use Santyl to the wound beds. He reports chronic pain to the wounds that is stable. He reports having a primary care appointment on October 12. He has no issues or complaints today. He overall feels well. 9/20; patient presents for follow-up. He has run out of [place] and is currently keeping the area covered. He reports improvement in pain. He has no issues or complaints today. He overall feels well. He states that he would like a letter reflecting that he is not able to work at this time T present in court. o 10/4; patient presents for follow-up. He has been using silver alginate but has run out recently. He has no issues or complaints today. He denies signs of infection. He reports improvement in wound healing. Electronic Signature(s) Signed: 04/09/2021 1:55:13 PM By: Geralyn Corwin DO Entered By: Geralyn Corwin on 04/09/2021 13:47:58 -------------------------------------------------------------------------------- Physical Exam Details Patient Name: Date of Service: [name], [name] 04/09/2021 12:30 PM Medical Record Number: [id number] Patient Account Number: [id number] Date of Birth/Sex: Treating RN: 1966-07-07 (55 y.o. [name] Primary Care Provider: Thad Ranger Other Clinician: Referring  Provider: Treating Provider/Extender: Vanita Panda in Treatment: 9 Constitutional respirations regular, non-labored and within  target range for patient.Marland Kitchen Psychiatric pleasant and cooperative. Notes Left lower extremity: 1 small open wound with granulation tissue present and circumferential callus. No signs of infection on exam. Most proximal wound is healed and epithelialized. Electronic Signature(s) Signed: 04/09/2021 1:55:13 PM By: Geralyn Corwin DO Entered By: Geralyn Corwin on 04/09/2021 13:53:06 -------------------------------------------------------------------------------- Physician Orders Details Patient Name: Date of Service: [name], [name] 04/09/2021 12:30 PM Medical Record Number: [id number] Patient Account Number: [id number] Date of Birth/Sex: Treating RN: [birth year] (55 y.o. [name] Primary Care Provider: Thad Ranger Other Clinician: Referring Provider: Treating Provider/Extender: Vanita Panda in Treatment: 9 Verbal / Phone Orders: No Diagnosis Coding ICD-10 Coding Code Description S91.302D Unspecified open wound, left foot, subsequent encounter S81.802D Unspecified open wound, left lower leg, subsequent encounter I73.9 Peripheral vascular disease, unspecified E11.621 Type 2 diabetes mellitus with foot ulcer Follow-up Appointments Return Appointment in 2 weeks. Bathing/ Shower/ Hygiene May shower and wash wound with soap and water. - prior to dressing change Edema Control - Lymphedema / SCD / Other Elevate legs to the level of the heart or above for 30 minutes daily and/or when sitting, a frequency of: - throughout the day Avoid standing for long periods of time. Additional Orders / Instructions Follow Nutritious Diet - 100-120g of protein daily. Other: - Patient unable to work at this time due to wounds to left foot and leg. Reevaluating wound and treatment plan every two weeks. Wound  Treatment Wound #3 - Calcaneus Wound Laterality: Left Cleanser: Soap and Water Every Other Day/15 Days Discharge Instructions: May shower and wash wound with dial antibacterial soap and water prior to dressing change. Prim Dressing: KerraCel Ag Gelling Fiber Dressing, 4x5 in (silver alginate) Every Other Day/15 Days ary Discharge Instructions: Apply silver alginate to wound bed as instructed Secondary Dressing: Bordered Gauze, 2x2 in Every Other Day/15 Days Discharge Instructions: Apply over primary dressing as directed. Electronic Signature(s) Signed: 04/09/2021 1:55:13 PM By: Geralyn Corwin DO Entered By: Geralyn Corwin on 04/09/2021 13:53:33 -------------------------------------------------------------------------------- Problem List Details Patient Name: Date of Service: [name], [name] 04/09/2021 12:30 PM Medical Record Number: [id number] Patient Account Number: [id number] Date of Birth/Sex: Treating RN: 03/07/1966 (55 y.o. [name] Primary Care Provider: Thad Ranger Other Clinician: Referring Provider: Treating Provider/Extender: Vanita Panda in Treatment: 9 Active Problems ICD-10 Encounter Code Description Active Date MDM Diagnosis S91.302D Unspecified open wound, left foot, subsequent encounter 02/25/2021 No Yes S81.802D Unspecified open wound, left lower leg, subsequent encounter 02/25/2021 No Yes I73.9 Peripheral vascular disease, unspecified 02/04/2021 No Yes E11.621 Type 2 diabetes mellitus with foot ulcer 02/04/2021 No Yes Inactive Problems ICD-10 Code Description Active Date Inactive Date S91.302A Unspecified open wound, left foot, initial encounter 02/04/2021 02/04/2021 S81.802A Unspecified open wound, left lower leg, initial encounter 02/04/2021 02/04/2021 Resolved Problems Electronic Signature(s) Signed: 04/09/2021 1:55:13 PM By: Geralyn Corwin DO Entered By: Geralyn Corwin on 04/09/2021  13:45:03 -------------------------------------------------------------------------------- Progress Note Details Patient Name: Date of Service: [name], [name] 04/09/2021 12:30 PM Medical Record Number: [id number] Patient Account Number: [id number] Date of Birth/Sex: Treating RN: 06-26-1966 (55 y.o. [name] Primary Care Provider: Thad Ranger Other Clinician: Referring Provider: Treating Provider/Extender: Vanita Panda in Treatment: 9 Subjective Chief Complaint Information obtained from Patient Left lower extremity wounds History of Present Illness (HPI) Admission 8/1 [name] is a 55 year old male with a past medical history of controlled type 2 diabetes on oral agents, polysubstance abuse (tobacco, alcohol, cocaine)  and peripheral arterial disease that presents with 3 left lower extremity wounds. He states that the wound to his heel started out as dried cracking that eventually became infected and worsened. He was following podiatry for this issue. This wound has been ongoing for about 3 months. There are 2 other wounds to his lateral left leg that started out as an injury from hitting his bed post. He states this happened several weeks ago. He reports mild tenderness to the wound beds. He has been using Santyl prescribed to him on discharge from hospital admission. He has no issues or complaints today. He denies signs of infection. 8/15; patient presents for 2-week follow-up. He states he is out of Santyl. He reports following up with vein and vascular. He reports pain to the wound sites although stable. He currently denies signs of infection. 8/22; patient presents for 1 week follow-up. He states he has been using Santyl daily to the wound beds. He continues to report pain throughout the wound sites. He denies increased warmth erythema or purulent drainage. Unfortunately patient was denied for care at the pain management clinic. He states he  is following up with his primary care provider in the next few days. 9/6; patient presents for follow-up. He continues to use Santyl to the wound beds. He reports chronic pain to the wounds that is stable. He reports having a primary care appointment on October 12. He has no issues or complaints today. He overall feels well. 9/20; patient presents for follow-up. He has run out of [place] and is currently keeping the area covered. He reports improvement in pain. He has no issues or complaints today. He overall feels well. He states that he would like a letter reflecting that he is not able to work at this time T present in court. o 10/4; patient presents for follow-up. He has been using silver alginate but has run out recently. He has no issues or complaints today. He denies signs of infection. He reports improvement in wound healing. Patient History Information obtained from Patient. Family History Unknown History. Social History Current every day smoker - 1 cigarette per day, Marital Status - Single, Alcohol Use - Moderate - Beer, Drug Use - Prior History - Cocaine, Caffeine Use - Rarely. Medical History Cardiovascular Patient has history of Peripheral Arterial Disease Endocrine Patient has history of Type II Diabetes Neurologic Patient has history of Neuropathy Objective Constitutional respirations regular, non-labored and within target range for patient.. Vitals Time Taken: 1:09 PM, Height: 67 in, Source: Stated, Weight: 190 lbs, Source: Stated, BMI: 29.8, Temperature: 97.8 F, Pulse: 69 bpm, Respiratory Rate: 18 breaths/min, Blood Pressure: 145/88 mmHg. Psychiatric pleasant and cooperative. General Notes: Left lower extremity: 1 small open wound with granulation tissue present and circumferential callus. No signs of infection on exam. Most proximal wound is healed and epithelialized. Integumentary (Hair, Skin) Wound #2 status is Healed - Epithelialized. Original cause of wound  was Gradually Appeared. The date acquired was: 11/04/2020. The wound has been in treatment 9 weeks. The wound is located on the Left,Lateral Lower Leg. The wound measures 0cm length x 0cm width x 0cm depth; 0cm^2 area and 0cm^3 volume. There is no tunneling or undermining noted. There is a none present amount of drainage noted. There is no granulation within the wound bed. There is no necrotic tissue within the wound bed. Wound #3 status is Open. Original cause of wound was Gradually Appeared. The date acquired was: 11/04/2020. The wound has been in treatment 9 weeks.  The wound is located on the Left Calcaneus. The wound measures 0.8cm length x 0.8cm width x 0.2cm depth; 0.503cm^2 area and 0.101cm^3 volume. There is Fat Layer (Subcutaneous Tissue) exposed. There is no tunneling or undermining noted. There is a small amount of serosanguineous drainage noted. The wound margin is thickened. There is large (67-100%) red granulation within the wound bed. There is no necrotic tissue within the wound bed. Assessment Active Problems ICD-10 Unspecified open wound, left foot, subsequent encounter Unspecified open wound, left lower leg, subsequent encounter Peripheral vascular disease, unspecified Type 2 diabetes mellitus with foot ulcer Patient has done very well with silver alginate. I recommended he continue this and keep the area covered. No signs of infection on exam. Follow-up in 2 weeks. Plan Follow-up Appointments: Return Appointment in 2 weeks. Bathing/ Shower/ Hygiene: May shower and wash wound with soap and water. - prior to dressing change Edema Control - Lymphedema / SCD / Other: Elevate legs to the level of the heart or above for 30 minutes daily and/or when sitting, a frequency of: - throughout the day Avoid standing for long periods of time. Additional Orders / Instructions: Follow Nutritious Diet - 100-120g of protein daily. Other: - Patient unable to work at this time due to wounds to  left foot and leg. Reevaluating wound and treatment plan every two weeks. WOUND #3: - Calcaneus Wound Laterality: Left Cleanser: Soap and Water Every Other Day/15 Days Discharge Instructions: May shower and wash wound with dial antibacterial soap and water prior to dressing change. Prim Dressing: KerraCel Ag Gelling Fiber Dressing, 4x5 in (silver alginate) Every Other Day/15 Days ary Discharge Instructions: Apply silver alginate to wound bed as instructed Secondary Dressing: Bordered Gauze, 2x2 in Every Other Day/15 Days Discharge Instructions: Apply over primary dressing as directed. 1. Silver alginate 2. Follow-up in 2 weeks Electronic Signature(s) Signed: 04/09/2021 1:55:13 PM By: Geralyn Corwin DO Entered By: Geralyn Corwin on 04/09/2021 13:54:30 -------------------------------------------------------------------------------- HxROS Details Patient Name: Date of Service: [name], [name] 04/09/2021 12:30 PM Medical Record Number: [id number] Patient Account Number: [id number] Date of Birth/Sex: Treating RN: February 04, 1966 (55 y.o. [name] Primary Care Provider: Thad Ranger Other Clinician: Referring Provider: Treating Provider/Extender: Vanita Panda in Treatment: 9 Information Obtained From Patient Cardiovascular Medical History: Positive for: Peripheral Arterial Disease Endocrine Medical History: Positive for: Type II Diabetes Time with diabetes: 1 year Treated with: Oral agents Blood sugar tested every day: No Neurologic Medical History: Positive for: Neuropathy Immunizations Pneumococcal Vaccine: Received Pneumococcal Vaccination: No Implantable Devices None Family and Social History Unknown History: Yes; Current every day smoker - 1 cigarette per day; Marital Status - Single; Alcohol Use: Moderate - Beer; Drug Use: Prior History - Cocaine; Caffeine Use: Rarely; Financial Concerns: No; Food, Clothing or Shelter Needs: No; Support  System Lacking: No; Transportation Concerns: No Electronic Signature(s) Signed: 04/09/2021 1:55:13 PM By: Geralyn Corwin DO Signed: 04/09/2021 5:32:19 PM By: Shawn Stall RN, BSN Entered By: Geralyn Corwin on 04/09/2021 13:52:22 -------------------------------------------------------------------------------- SuperBill Details Patient Name: Date of Service: [name], [name] 04/09/2021 Medical Record Number: [id number] Patient Account Number: [id number] Date of Birth/Sex: Treating RN: 06-Apr-1966 (55 y.o. [name] Primary Care Provider: Thad Ranger Other Clinician: Referring Provider: Treating Provider/Extender: Vanita Panda in Treatment: 9 Diagnosis Coding ICD-10 Codes Code Description S91.302D Unspecified open wound, left foot, subsequent encounter S81.802D Unspecified open wound, left lower leg, subsequent encounter I73.9 Peripheral vascular disease, unspecified E11.621 Type 2 diabetes mellitus with  foot ulcer Facility Procedures Physician Procedures : CPT4 Code Description Modifier [phone] [id number] - WC PHYS LEVEL 3 - EST PT ICD-10 Diagnosis Description S91.302D Unspecified open wound, left foot, subsequent encounter S81.802D Unspecified open wound, left lower leg, subsequent encounter I73.9 Peripheral  vascular disease, unspecified E11.621 Type 2 diabetes mellitus with foot ulcer Quantity: 1 Electronic Signature(s) Signed: 04/09/2021 1:55:13 PM By: Geralyn Corwin DO Entered By: Geralyn Corwin on 04/09/2021 13:54:50

## 2021-04-11 NOTE — Progress Notes (Signed)
ZYMIR, NAPOLI ([id number]) Visit Report for 04/09/2021 Arrival Information Details Patient Name: Date of Service: Tyrone Sage 04/09/2021 12:30 PM Medical Record Number: [id number] Patient Account Number: [id number] Date of Birth/Sex: Treating RN: [birth year] (55 y.o. [name] Primary Care Gabriellah Rabel: Thad Ranger Other Clinician: Referring Alekzander Cardell: Treating Giamarie Bueche/Extender: Vanita Panda in Treatment: 9 Visit Information History Since Last Visit Added or deleted any medications: No Patient Arrived: Crutches Any new allergies or adverse reactions: No Arrival Time: 13:02 Had a fall or experienced change in No Accompanied By: self activities of daily living that may affect Transfer Assistance: None risk of falls: Patient Identification Verified: Yes Signs or symptoms of abuse/neglect since last visito No Secondary Verification Process Completed: Yes Hospitalized since last visit: No Patient Requires Transmission-Based Precautions: No Implantable device outside of the clinic excluding No Patient Has Alerts: Yes cellular tissue based products placed in the center Patient Alerts: L ABI: 0.36 TBI: 0 since last visit: Has Dressing in Place as Prescribed: No Pain Present Now: Yes Notes pt states he is out of dressings Electronic Signature(s) Signed: 04/11/2021 6:17:04 PM By: Zenaida Deed RN, BSN Entered By: Zenaida Deed on 04/09/2021 13:06:15 -------------------------------------------------------------------------------- Clinic Level of Care Assessment Details Patient Name: Date of Service: [name], [name] 04/09/2021 12:30 PM Medical Record Number: [id number] Patient Account Number: [id number] Date of Birth/Sex: Treating RN: 04-10-1966 (55 y.o. [name] Primary Care Madysyn Hanken: Thad Ranger Other Clinician: Referring Alaa Eyerman: Treating Mehtaab Mayeda/Extender: Vanita Panda in Treatment: 9 Clinic Level of Care  Assessment Items TOOL 4 Quantity Score []  - 0 Use when only an EandM is performed on FOLLOW-UP visit ASSESSMENTS - Nursing Assessment / Reassessment X- 1 10 Reassessment of Co-morbidities (includes updates in patient status) X- 1 5 Reassessment of Adherence to Treatment Plan ASSESSMENTS - Wound and Skin A ssessment / Reassessment X - Simple Wound Assessment / Reassessment - one wound 1 5 []  - 0 Complex Wound Assessment / Reassessment - multiple wounds []  - 0 Dermatologic / Skin Assessment (not related to wound area) ASSESSMENTS - Focused Assessment []  - 0 Circumferential Edema Measurements - multi extremities []  - 0 Nutritional Assessment / Counseling / Intervention X- 1 5 Lower Extremity Assessment (monofilament, tuning fork, pulses) []  - 0 Peripheral Arterial Disease Assessment (using hand held doppler) ASSESSMENTS - Ostomy and/or Continence Assessment and Care []  - 0 Incontinence Assessment and Management []  - 0 Ostomy Care Assessment and Management (repouching, etc.) PROCESS - Coordination of Care X - Simple Patient / Family Education for ongoing care 1 15 []  - 0 Complex (extensive) Patient / Family Education for ongoing care X- 1 10 Staff obtains , Records, T Results / Process Orders est []  - 0 Staff telephones HHA, Nursing Homes / Clarify orders / etc []  - 0 Routine Transfer to another Facility (non-emergent condition) []  - 0 Routine Hospital Admission (non-emergent condition) []  - 0 New Admissions / / Ordering NPWT Apligraf, etc. , []  - 0 Emergency Hospital Admission (emergent condition) X- 1 10 Simple Discharge Coordination []  - 0 Complex (extensive) Discharge Coordination PROCESS - Special Needs []  - 0 Pediatric / Minor Patient Management []  - 0 Isolation Patient Management []  - 0 Hearing / Language / Visual special needs []  - 0 Assessment of Community assistance (transportation, D/C planning, etc.) []  -  0 Additional assistance / Altered mentation []  - 0 Support Surface(s) Assessment (bed, cushion, seat, etc.) INTERVENTIONS - Wound Cleansing / Measurement X - Simple  Wound Cleansing - one wound 1 5 []  - 0 Complex Wound Cleansing - multiple wounds X- 1 5 Wound Imaging (photographs - any number of wounds) []  - 0 Wound Tracing (instead of photographs) X- 1 5 Simple Wound Measurement - one wound []  - 0 Complex Wound Measurement - multiple wounds INTERVENTIONS - Wound Dressings X - Small Wound Dressing one or multiple wounds 1 10 []  - 0 Medium Wound Dressing one or multiple wounds []  - 0 Large Wound Dressing one or multiple wounds X- 1 5 Application of Medications - topical []  - 0 Application of Medications - injection INTERVENTIONS - Miscellaneous []  - 0 External ear exam []  - 0 Specimen Collection (cultures, biopsies, blood, body fluids, etc.) []  - 0 Specimen(s) / Culture(s) sent or taken to Lab for analysis []  - 0 Patient Transfer (multiple staff / / Similar devices) []  - 0 Simple Staple / Suture removal (25 or less) []  - 0 Complex Staple / Suture removal (26 or more) []  - 0 Hypo / Hyperglycemic Management (close monitor of Blood Glucose) []  - 0 Ankle / Brachial Index (ABI) - do not check if billed separately X- 1 5 Vital Signs Has the patient been seen at the hospital within the last three years: Yes Total Score: 95 Level Of Care: New/Established - Level 3 Electronic Signature(s) Signed: 04/11/2021 6:17:04 PM By: RN, BSN Entered By: on 04/09/2021 13:39:48 -------------------------------------------------------------------------------- Encounter Discharge Information Details Patient Name: Date of Service: [name], [name] 04/09/2021 12:30 PM Medical Record Number: Patient Account Number: Date of Birth/Sex: Treating RN: Nov 26, 1965 (55 y.o. Nurse, adult Primary Care Arielle Eber: Other  Clinician: Referring Jimi Giza: Treating Alexius Ellington/Extender: in Treatment: 9 Encounter Discharge Information Items Discharge Condition: Stable Ambulatory Status: Crutches Discharge Destination: Home Transportation: Private Auto Accompanied By: self Schedule Follow-up Appointment: Yes Clinical Summary of Care: Patient Declined Electronic Signature(s) Signed: 04/11/2021 6:17:04 PM By: RN, BSN Entered By: 06/11/2021 on 04/09/2021 13:45:51 -------------------------------------------------------------------------------- Lower Extremity Assessment Details Patient Name: Date of Service: [name], [name] 04/09/2021 12:30 PM Medical Record Number: 06/09/2021 Patient Account Number: 06/09/2021 Date of Birth/Sex: Treating RN: 07/30/1965 (55 y.o. 11/19/1965 Primary Care Eliel Dudding: 58 Other Clinician: Referring Chelise Hanger: Treating Akio Hudnall/Extender: [name], Crystal Weeks in Treatment: 9 Edema Assessment Assessed: [Left: No] [Right: No] Edema: [Left: Ye] [Right: s] Calf Left: Right: Point of Measurement: 34 cm From Medial Instep 38 cm Ankle Left: Right: Point of Measurement: 11 cm From Medial Instep 26 cm Vascular Assessment Pulses: Dorsalis Pedis Palpable: [Left:No] Electronic Signature(s) Signed: 04/11/2021 6:17:04 PM By: Vanita Panda RN, BSN Entered By: 06/11/2021 on 04/09/2021 13:12:10 -------------------------------------------------------------------------------- Multi Wound Chart Details Patient Name: Date of Service: Zenaida Deed, [name] 04/09/2021 12:30 PM Medical Record Number: 06/09/2021 Patient Account Number: [id number] Date of Birth/Sex: Treating RN: Dec 03, 1965 (55 y.o. 58 Primary Care Alize Acy: [name] Other Clinician: Referring Meggie Laseter: Treating Twilia Yaklin/Extender: Thad Ranger, Crystal Weeks in Treatment: 9 Vital Signs Height(in):  67 Pulse(bpm): 69 Weight(lbs): 190 Blood Pressure(mmHg): 145/88 Body Mass Index(BMI): 30 Temperature(F): 97.8 Respiratory Rate(breaths/min): 18 Photos: [N/A:N/A] Left, Lateral Lower Leg Left Calcaneus N/A Wound Location: Gradually Appeared Gradually Appeared N/A Wounding Event: Arterial Insufficiency Ulcer Diabetic Wound/Ulcer of the Lower N/A Primary Etiology: Extremity N/A Arterial Insufficiency Ulcer N/A Secondary Etiology: Peripheral Arterial Disease, Type II Peripheral Arterial Disease, Type II N/A Comorbid History: Diabetes, Neuropathy Diabetes, Neuropathy  11/04/2020 11/04/2020 N/A Date Acquired: 9 9 N/A Weeks of Treatment: Healed - Epithelialized Open N/A Wound Status: 0x0x0 0.8x0.8x0.2 N/A Measurements L x W x D (cm) 0 0.503 N/A A (cm) : rea 0 0.101 N/A Volume (cm) : 100.00% 97.40% N/A % Reduction in Area: 100.00% 98.20% N/A % Reduction in Volume: Full Thickness Without Exposed Grade 2 N/A Classification: Support Structures None Present Small N/A Exudate Amount: N/A Serosanguineous N/A Exudate Type: N/A red, brown N/A Exudate Color: N/A Thickened N/A Wound Margin: None Present (0%) Large (67-100%) N/A Granulation Amount: N/A Red N/A Granulation Quality: None Present (0%) None Present (0%) N/A Necrotic Amount: Fascia: No Fat Layer (Subcutaneous Tissue): Yes N/A Exposed Structures: Fat Layer (Subcutaneous Tissue): No Fascia: No Tendon: No Tendon: No Muscle: No Muscle: No Joint: No Joint: No Bone: No Bone: No Large (67-100%) None N/A Epithelialization: Treatment Notes Electronic Signature(s) Signed: 04/09/2021 1:55:13 PM By: Geralyn Corwin DO Signed: 04/09/2021 5:32:19 PM By: Shawn Stall RN, BSN Entered By: Geralyn Corwin on 04/09/2021 13:45:09 -------------------------------------------------------------------------------- Multi-Disciplinary Care Plan Details Patient Name: Date of Service: [name], [name] 04/09/2021 12:30  PM Medical Record Number: [id number] Patient Account Number: [id number] Date of Birth/Sex: Treating RN: [birth year] (55 y.o. [name] Primary Care Raymond Azure: Thad Ranger Other Clinician: Referring Cecelia Graciano: Treating Doniesha Landau/Extender: Vanita Panda in Treatment: 9 Multidisciplinary Care Plan reviewed with physician Active Inactive Nutrition Nursing Diagnoses: Impaired glucose control: actual or potential Potential for alteratiion in Nutrition/Potential for imbalanced nutrition Goals: Patient/caregiver agrees to and verbalizes understanding of need to use nutritional supplements and/or vitamins as prescribed Date Initiated: 02/04/2021 Target Resolution Date: 05/03/2021 Goal Status: Active Patient/caregiver will maintain therapeutic glucose control Date Initiated: 02/04/2021 Target Resolution Date: 04/25/2021 Goal Status: Active Interventions: Assess HgA1c results as ordered upon admission and as needed Assess patient nutrition upon admission and as needed per policy Provide education on elevated blood sugars and impact on wound healing Provide education on nutrition Treatment Activities: Education provided on Nutrition : 02/04/2021 Notes: Wound/Skin Impairment Nursing Diagnoses: Impaired tissue integrity Knowledge deficit related to smoking impact on wound healing Knowledge deficit related to ulceration/compromised skin integrity Goals: Patient will demonstrate a reduced rate of smoking or cessation of smoking Date Initiated: 02/04/2021 Target Resolution Date: 04/26/2021 Goal Status: Active Patient/caregiver will verbalize understanding of skin care regimen Date Initiated: 02/04/2021 Target Resolution Date: 04/11/2021 Goal Status: Active Interventions: Assess patient/caregiver ability to obtain necessary supplies Assess patient/caregiver ability to perform ulcer/skin care regimen upon admission and as needed Assess ulceration(s) every  visit Provide education on smoking Provide education on ulcer and skin care Notes: Electronic Signature(s) Signed: 04/11/2021 6:17:04 PM By: Zenaida Deed RN, BSN Entered By: Zenaida Deed on 04/09/2021 13:23:04 -------------------------------------------------------------------------------- Pain Assessment Details Patient Name: Date of Service: [name], [name] 04/09/2021 12:30 PM Medical Record Number: [id number] Patient Account Number: [id number] Date of Birth/Sex: Treating RN: [birth year] (55 y.o. [name] Primary Care Gerber Penza: Thad Ranger Other Clinician: Referring Shannelle Alguire: Treating Mallisa Alameda/Extender: Vanita Panda in Treatment: 9 Active Problems Location of Pain Severity and Description of Pain Patient Has Paino Yes Site Locations Pain Location: Generalized Pain With Dressing Change: Yes Duration of the Pain. Constant / Intermittento Intermittent Rate the pain. Current Pain Level: 3 Worst Pain Level: 9 Character of Pain Describe the Pain: Aching Pain Management and Medication Current Pain Management: Rest: Yes Is the Current Pain Management Adequate: Adequate How does your wound impact your activities of daily livingo Sleep: Yes Bathing: No  Appetite: No Relationship With Others: No Bladder Continence: No Emotions: Yes Bowel Continence: No Work: No Toileting: No Drive: No Dressing: No Hobbies: No Notes reports pain in foot and ankle area Electronic Signature(s) Signed: 04/11/2021 6:17:04 PM By: Zenaida Deed RN, BSN Entered By: Zenaida Deed on 04/09/2021 13:09:50 -------------------------------------------------------------------------------- Patient/Caregiver Education Details Patient Name: Date of Service: Tyrone Sage 10/4/2022andnbsp12:30 PM Medical Record Number: [id number] Patient Account Number: [id number] Date of Birth/Gender: Treating RN: 1965/08/03 (55 y.o. [name] Primary Care  Physician: Thad Ranger Other Clinician: Referring Physician: Treating Physician/Extender: Vanita Panda in Treatment: 9 Education Assessment Education Provided To: Patient Education Topics Provided Smoking and Wound Healing: Methods: Explain/Verbal Responses: Reinforcements needed, State content correctly Wound/Skin Impairment: Methods: Explain/Verbal Responses: Reinforcements needed, State content correctly Electronic Signature(s) Signed: 04/11/2021 6:17:04 PM By: Zenaida Deed RN, BSN Entered By: Zenaida Deed on 04/09/2021 13:24:15 -------------------------------------------------------------------------------- Wound Assessment Details Patient Name: Date of Service: [name], [name] 04/09/2021 12:30 PM Medical Record Number: [id number] Patient Account Number: [id number] Date of Birth/Sex: Treating RN: 1965/11/28 (54 y.o. [name] Primary Care Kyran Connaughton: Thad Ranger Other Clinician: Referring Kaziah Krizek: Treating Danalee Flath/Extender: Vanita Panda in Treatment: 9 Wound Status Wound Number: 2 Primary Etiology: Arterial Insufficiency Ulcer Wound Location: Left, Lateral Lower Leg Wound Status: Healed - Epithelialized Wounding Event: Gradually Appeared Comorbid History: Peripheral Arterial Disease, Type II Diabetes, Neuropathy Date Acquired: 11/04/2020 Weeks Of Treatment: 9 Clustered Wound: No Photos Wound Measurements Length: (cm) Width: (cm) Depth: (cm) Area: (cm) Volume: (cm) 0 % Reduction in Area: 100% 0 % Reduction in Volume: 100% 0 Epithelialization: Large (67-100%) 0 Tunneling: No 0 Undermining: No Wound Description Classification: Full Thickness Without Exposed Support Structures Exudate Amount: None Present Foul Odor After Cleansing: No Slough/Fibrino No Wound Bed Granulation Amount: None Present (0%) Exposed Structure Necrotic Amount: None Present (0%) Fascia Exposed: No Fat Layer  (Subcutaneous Tissue) Exposed: No Tendon Exposed: No Muscle Exposed: No Joint Exposed: No Bone Exposed: No Treatment Notes Wound #2 (Lower Leg) Wound Laterality: Left, Lateral Cleanser Peri-Wound Care Topical Primary Dressing Secondary Dressing Secured With Compression Wrap Compression Stockings Add-Ons Electronic Signature(s) Signed: 04/11/2021 6:17:04 PM By: Zenaida Deed RN, BSN Entered By: Zenaida Deed on 04/09/2021 13:37:17 -------------------------------------------------------------------------------- Wound Assessment Details Patient Name: Date of Service: [name], [name] 04/09/2021 12:30 PM Medical Record Number: [id number] Patient Account Number: [id number] Date of Birth/Sex: Treating RN: [birth year] (55 y.o. [name] Primary Care Crissie Aloi: Thad Ranger Other Clinician: Referring Tyshan Enderle: Treating Aldair Rickel/Extender: Vanita Panda in Treatment: 9 Wound Status Wound Number: 3 Primary Etiology: Diabetic Wound/Ulcer of the Lower Extremity Wound Location: Left Calcaneus Secondary Etiology: Arterial Insufficiency Ulcer Wounding Event: Gradually Appeared Wound Status: Open Date Acquired: 11/04/2020 Comorbid History: Peripheral Arterial Disease, Type II Diabetes, Neuropathy Weeks Of Treatment: 9 Clustered Wound: No Photos Wound Measurements Length: (cm) 0.8 Width: (cm) 0.8 Depth: (cm) 0.2 Area: (cm) 0.503 Volume: (cm) 0.101 % Reduction in Area: 97.4% % Reduction in Volume: 98.2% Epithelialization: None Tunneling: No Undermining: No Wound Description Classification: Grade 2 Wound Margin: Thickened Exudate Amount: Small Exudate Type: Serosanguineous Exudate Color: red, brown Foul Odor After Cleansing: No Slough/Fibrino No Wound Bed Granulation Amount: Large (67-100%) Exposed Structure Granulation Quality: Red Fascia Exposed: No Necrotic Amount: None Present (0%) Fat Layer (Subcutaneous Tissue) Exposed:  Yes Tendon Exposed: No Muscle Exposed: No Joint Exposed: No Bone Exposed: No Treatment Notes Wound #3 (Calcaneus) Wound Laterality: Left Cleanser Soap and Water Discharge  Instruction: May shower and wash wound with dial antibacterial soap and water prior to dressing change. Peri-Wound Care Topical Primary Dressing KerraCel Ag Gelling Fiber Dressing, 4x5 in (silver alginate) Discharge Instruction: Apply silver alginate to wound bed as instructed Secondary Dressing Bordered Gauze, 2x2 in Discharge Instruction: Apply over primary dressing as directed. Secured With Compression Wrap Compression Stockings Facilities manager) Signed: 04/09/2021 5:32:19 PM By: Shawn Stall RN, BSN Signed: 04/11/2021 6:17:04 PM By: Zenaida Deed RN, BSN Entered By: Shawn Stall on 04/09/2021 13:22:09 -------------------------------------------------------------------------------- Vitals Details Patient Name: Date of Service: [name], [name] 04/09/2021 12:30 PM Medical Record Number: [id number] Patient Account Number: [id number] Date of Birth/Sex: Treating RN: 1966-05-21 (55 y.o. [name] Primary Care Khushi Zupko: Thad Ranger Other Clinician: Referring Vastie Douty: Treating Shavonta Gossen/Extender: Vanita Panda in Treatment: 9 Vital Signs Time Taken: 13:09 Temperature (F): 97.8 Height (in): 67 Pulse (bpm): 69 Source: Stated Respiratory Rate (breaths/min): 18 Weight (lbs): 190 Blood Pressure (mmHg): 145/88 Source: Stated Reference Range: 80 - 120 mg / dl Body Mass Index (BMI): 29.8 Electronic Signature(s) Signed: 04/11/2021 6:17:04 PM By: Zenaida Deed RN, BSN Entered By: Zenaida Deed on 04/09/2021 13:10:28

## 2021-04-15 ENCOUNTER — Other Ambulatory Visit: Payer: Self-pay

## 2021-04-17 ENCOUNTER — Ambulatory Visit (INDEPENDENT_AMBULATORY_CARE_PROVIDER_SITE_OTHER): Payer: 59 | Admitting: Family Medicine

## 2021-04-17 ENCOUNTER — Encounter (HOSPITAL_BASED_OUTPATIENT_CLINIC_OR_DEPARTMENT_OTHER): Payer: Self-pay

## 2021-04-17 ENCOUNTER — Other Ambulatory Visit: Payer: Self-pay

## 2021-04-17 DIAGNOSIS — Z538 Procedure and treatment not carried out for other reasons: Secondary | ICD-10-CM

## 2021-04-19 ENCOUNTER — Other Ambulatory Visit: Payer: Self-pay | Admitting: Family Medicine

## 2021-04-20 NOTE — Telephone Encounter (Signed)
No PCP noted in chart. Pt needs to get established with a PCP

## 2021-04-23 ENCOUNTER — Other Ambulatory Visit: Payer: Self-pay | Admitting: Family Medicine

## 2021-04-23 ENCOUNTER — Encounter (HOSPITAL_BASED_OUTPATIENT_CLINIC_OR_DEPARTMENT_OTHER): Payer: 59 | Admitting: Internal Medicine

## 2021-04-23 DIAGNOSIS — E118 Type 2 diabetes mellitus with unspecified complications: Secondary | ICD-10-CM

## 2021-04-24 ENCOUNTER — Other Ambulatory Visit: Payer: Self-pay

## 2021-04-25 ENCOUNTER — Encounter (HOSPITAL_BASED_OUTPATIENT_CLINIC_OR_DEPARTMENT_OTHER): Payer: 59 | Admitting: Internal Medicine

## 2021-04-25 ENCOUNTER — Other Ambulatory Visit: Payer: Self-pay | Admitting: Nurse Practitioner

## 2021-04-25 ENCOUNTER — Other Ambulatory Visit: Payer: Self-pay

## 2021-04-25 ENCOUNTER — Telehealth: Payer: Self-pay

## 2021-04-25 DIAGNOSIS — S91302D Unspecified open wound, left foot, subsequent encounter: Secondary | ICD-10-CM | POA: Diagnosis not present

## 2021-04-25 NOTE — Telephone Encounter (Signed)
Pt is asking to for a generic medication for this one (Citalopram 20 mg)  Insurance is charging 30 bucks for it and he can't afford it!

## 2021-04-25 NOTE — Telephone Encounter (Signed)
Spoke with [employer] and wellness. Patient has $0 for rx. Patient advised, no further questions or concerns.

## 2021-04-30 ENCOUNTER — Ambulatory Visit: Payer: Self-pay | Admitting: Family

## 2021-04-30 NOTE — Progress Notes (Signed)
JYE, FARISS ([id number]) Visit Report for 04/25/2021 Arrival Information Details Patient Name: Date of Service: Tony Hicks 04/25/2021 10:45 A M Medical Record Number: [id number] Patient Account Number: [id number] Date of Birth/Sex: Treating RN: [birth year] (55 y.o. Tony Hicks, Tony Hicks Primary Care Annaliese Saez: Thad Ranger Other Clinician: Referring Serafin Decatur: Treating Kali Deadwyler/Extender: Vanita Panda in Treatment: 11 Visit Information History Since Last Visit Added or deleted any medications: No Patient Arrived: Crutches Any new allergies or adverse reactions: No Arrival Time: 10:42 Had a fall or experienced change in No Accompanied By: self activities of daily living that may affect Transfer Assistance: Manual risk of falls: Patient Identification Verified: Yes Signs or symptoms of abuse/neglect since last visito No Secondary Verification Process Completed: Yes Hospitalized since last visit: No Patient Requires Transmission-Based Precautions: No Implantable device outside of the clinic excluding No Patient Has Alerts: Yes cellular tissue based products placed in the center Patient Alerts: L ABI: 0.36 TBI: 0 since last visit: Has Dressing in Place as Prescribed: Yes Pain Present Now: Yes Electronic Signature(s) Signed: 04/30/2021 2:37:05 PM By: Karl Ito Entered By: Karl Ito on 04/25/2021 10:43:32 -------------------------------------------------------------------------------- Encounter Discharge Information Details Patient Name: Date of Service: Tony Hicks, Tony Hicks 04/25/2021 10:45 A M Medical Record Number: [id number] Patient Account Number: [id number] Date of Birth/Sex: Treating RN: 08/08/1965 (55 y.o. Tony Hicks, Tony Hicks Primary Care Tony Hicks: Thad Ranger Other Clinician: Referring Dynesha Woolen: Treating Math Brazie/Extender: Vanita Panda in Treatment: 11 Encounter Discharge Information Items Post Procedure  Vitals Discharge Condition: Stable Temperature (F): 97.7 Ambulatory Status: Crutches Pulse (bpm): 65 Discharge Destination: Home Respiratory Rate (breaths/min): 18 Transportation: Private Auto Blood Pressure (mmHg): 137/85 Schedule Follow-up Appointment: Yes Clinical Summary of Care: Provided on 04/25/2021 Form Type Recipient Paper Patient Patient Electronic Signature(s) Signed: 04/30/2021 2:37:05 PM By: Karl Ito Entered By: Karl Ito on 04/25/2021 11:16:42 -------------------------------------------------------------------------------- Lower Extremity Assessment Details Patient Name: Date of Service: Tony Hicks, Tony Hicks 04/25/2021 10:45 A M Medical Record Number: [id number] Patient Account Number: [id number] Date of Birth/Sex: Treating RN: 12/21/1965 (55 y.o. Tony Hicks, Tony Hicks Primary Care [name]: Thad Ranger Other Clinician: Referring Bauer Ausborn: Treating Elwyn Lowden/Extender: Levon Hedger, Crystal Weeks in Treatment: 11 Edema Assessment Assessed: [Left: Yes] [Right: No] Edema: [Left: Ye] [Right: s] Calf Left: Right: Point of Measurement: 34 cm From Medial Instep 34 cm Ankle Left: Right: Point of Measurement: 11 cm From Medial Instep 24.5 cm Vascular Assessment Pulses: Dorsalis Pedis Palpable: [Left:Yes] Electronic Signature(s) Signed: 04/30/2021 2:37:05 PM By: Karl Ito Signed: 04/30/2021 5:23:14 PM By: Fonnie Mu RN Entered By: Karl Ito on 04/25/2021 10:53:06 -------------------------------------------------------------------------------- Multi Wound Chart Details Patient Name: Date of Service: Tony Hicks, Tony Hicks 04/25/2021 10:45 A M Medical Record Number: [id number] Patient Account Number: [id number] Date of Birth/Sex: Treating RN: 12-08-1965 (55 y.o. 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] [N/A:N/A] Secondary Etiology: [3:Peripheral Arterial Disease, Type II N/A] Comorbid History: [3:Diabetes, Neuropathy 11/04/2020] [N/A:N/A] Date Acquired: [3:11] [N/A:N/A] Weeks of Treatment: [3:Open] [N/A:N/A] Wound Status: [3:0.3x0.3x0.3] [N/A:N/A] Measurements L x W x D (cm) [3:0.071] [N/A:N/A] A (cm) : rea [3:0.021] [N/A:N/A] Volume (cm) : [3:99.60%] [N/A:N/A] % Reduction in A [  3:rea: 99.60%] [N/A:N/A] % Reduction in Volume: [3:Grade 2] [N/A:N/A] Classification: [3:Medium] [N/A:N/A] Exudate A mount: [3:Serosanguineous] [N/A:N/A] Exudate Type: [3:red, brown] [N/A:N/A] Exudate Color: [3:Distinct, outline attached] [N/A:N/A] Wound Margin: [3:Large (67-100%)] [N/A:N/A] Granulation A mount: [3:Red, Pink] [N/A:N/A] Granulation Quality: [3:None Present (0%)] [N/A:N/A] Necrotic A mount: [3:Fat Layer (Subcutaneous Tissue): Yes N/A] Exposed Structures: [3:Fascia: No Tendon: No Muscle: No Joint: No Bone: No Large (67-100%)] [N/A:N/A] Epithelialization: [3:Debridement - Excisional] [N/A:N/A] Debridement: Pre-procedure Verification/Time Out 10:54 [N/A:N/A] Taken: [3:Lidocaine 5% topical ointment] [N/A:N/A] Pain Control: [3:Callus, Subcutaneous] [N/A:N/A] Tissue Debrided: [3:Skin/Subcutaneous Tissue] [N/A:N/A] Level: [3:1] [N/A:N/A] Debridement A (sq cm): [3:rea Curette] [N/A:N/A] Instrument: [3:Minimum] [N/A:N/A] Bleeding: [3:Pressure] [N/A:N/A] Hemostasis A chieved: [3:Procedure was tolerated well] [N/A:N/A] Debridement Treatment Response: [3:0.3x0.3x0.3] [N/A:N/A] Post Debridement  Measurements L x W x D (cm) [3:0.021] [N/A:N/A] Post Debridement Volume: (cm) [3:Debridement] [N/A:N/A] Treatment Notes Wound #3 (Calcaneus) Wound Laterality: Left Cleanser Soap and Water Discharge Instruction: May shower and wash wound with dial antibacterial soap and water prior to dressing change. Peri-Wound Care Topical Primary Dressing KerraCel Ag Gelling Fiber Dressing, 4x5 in (silver alginate) Discharge Instruction: Apply silver alginate to wound bed as instructed Secondary Dressing Bordered Gauze, 2x2 in Discharge Instruction: Apply over primary dressing as directed. Secured With Compression Wrap Compression Stockings Facilities manager) Signed: 04/26/2021 1:40:13 PM By: Geralyn Corwin DO Signed: 04/30/2021 5:23:14 PM By: Fonnie Mu RN Entered By: Geralyn Corwin on 04/26/2021 13:35:01 -------------------------------------------------------------------------------- Multi-Disciplinary Care Plan Details Patient Name: Date of Service: Tony Hicks, Tony Hicks 04/25/2021 10:45 A M Medical Record Number: [id number] Patient Account Number: [id number] Date of Birth/Sex: Treating RN: 04/13/1966 (55 y.o. Tony Hicks, Tony Hicks Primary Care Devon Kingdon: Thad Ranger Other Clinician: Referring Moneka Mcquinn: Treating Jennesis Ramaswamy/Extender: Vanita Panda in Treatment: 11 Multidisciplinary Care Plan reviewed with physician Active Inactive Nutrition Nursing Diagnoses: Impaired glucose control: actual or potential Potential for alteratiion in Nutrition/Potential for imbalanced nutrition Goals: Patient/caregiver agrees to and verbalizes understanding of need to use nutritional supplements and/or vitamins as prescribed Date Initiated: 02/04/2021 Target Resolution Date: 05/02/2021 Goal Status: Active Patient/caregiver will maintain therapeutic glucose control Date Initiated: 02/04/2021 Target Resolution Date: 05/02/2021 Goal Status:  Active Interventions: Assess HgA1c results as ordered upon admission and as needed Assess patient nutrition upon admission and as needed per policy Provide education on elevated blood sugars and impact on wound healing Provide education on nutrition Treatment Activities: Education provided on Nutrition : 02/04/2021 Notes: Wound/Skin Impairment Nursing Diagnoses: Impaired tissue integrity Knowledge deficit related to smoking impact on wound healing Knowledge deficit related to ulceration/compromised skin integrity Goals: Patient will demonstrate a reduced rate of smoking or cessation of smoking Date Initiated: 02/04/2021 Target Resolution Date: 05/02/2021 Goal Status: Active Patient/caregiver will verbalize understanding of skin care regimen Date Initiated: 02/04/2021 Target Resolution Date: 05/02/2021 Goal Status: Active Interventions: Assess patient/caregiver ability to obtain necessary supplies Assess patient/caregiver ability to perform ulcer/skin care regimen upon admission and as needed Assess ulceration(s) every visit Provide education on smoking Provide education on ulcer and skin care Notes: Electronic Signature(s) Signed: 04/30/2021 2:37:05 PM By: Karl Ito Signed: 04/30/2021 5:23:14 PM By: Fonnie Mu RN Entered By: Karl Ito on 04/25/2021 10:47:49 -------------------------------------------------------------------------------- Pain Assessment Details Patient Name: Date of Service: Tony Hicks, Tony Hicks 04/25/2021 10:45 A M Medical Record Number: [id number] Patient Account Number: [id number] Date of Birth/Sex: Treating RN: [birth year] (55 y.o. Lucious Groves Primary Care Maeven Mcdougall: Thad Ranger Other Clinician: Referring Tura Roller: Treating Austina Constantin/Extender: Vanita Panda in Treatment: 11 Active Problems Location of Pain  Severity and Description of Pain Patient Has Paino No Site Locations Pain Management and  Medication Current Pain Management: Electronic Signature(s) Signed: 04/30/2021 2:37:05 PM By: Karl Ito Signed: 04/30/2021 5:23:14 PM By: Fonnie Mu RN Entered By: Karl Ito on 04/25/2021 10:46:33 -------------------------------------------------------------------------------- Patient/Caregiver Education Details Patient Name: Date of Service: Tony Hicks, Tony Hicks 10/20/2022andnbsp10:45 A M Medical Record Number: [id number] Patient Account Number: [id number] Date of Birth/Gender: Treating RN: 1965-12-22 (55 y.o. Lucious Groves Primary Care Physician: Thad Ranger Other Clinician: Referring Physician: Treating Physician/Extender: Vanita Panda in Treatment: 11 Education Assessment Education Provided To: Patient Education Topics Provided Elevated Blood Sugar/ Impact on Healing: Methods: Explain/Verbal, Printed Responses: State content correctly Smoking and Wound Healing: Methods: Explain/Verbal, Printed Responses: State content correctly Wound/Skin Impairment: Methods: Demonstration, Explain/Verbal, Printed Responses: State content correctly Electronic Signature(s) Signed: 04/30/2021 2:37:05 PM By: Karl Ito Entered By: Karl Ito on 04/25/2021 10:48:42 -------------------------------------------------------------------------------- Wound Assessment Details Patient Name: Date of Service: Tony Hicks, Tony Hicks 04/25/2021 10:45 A M Medical Record Number: [id number] Patient Account Number: [id number] Date of Birth/Sex: Treating RN: 02-15-1966 (55 y.o. 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 Wounding Event:  Gradually Appeared Wound Status: Open Date Acquired: 11/04/2020 Comorbid History: Peripheral Arterial Disease, Type II Diabetes, Neuropathy Weeks Of Treatment: 11 Clustered Wound: No Photos Wound Measurements Length: (cm) 0.3 Width: (cm) 0.3 Depth: (cm) 0.3 Area: (cm) 0.071 Volume: (cm) 0.021 % Reduction in Area: 99.6% % Reduction in Volume: 99.6% Epithelialization: Large (67-100%) Tunneling: No Undermining: No Wound Description Classification: Grade 2 Wound Margin: Distinct, outline attached Exudate Amount: Medium Exudate Type: Serosanguineous Exudate Color: red, brown Foul Odor After Cleansing: No Slough/Fibrino No Wound Bed Granulation Amount: Large (67-100%) Exposed Structure Granulation Quality: Red, Pink Fascia Exposed: No Necrotic Amount: None Present (0%) Fat Layer (Subcutaneous Tissue) Exposed: Yes Tendon Exposed: No Muscle Exposed: No Joint Exposed: No Bone Exposed: No Treatment Notes Wound #3 (Calcaneus) Wound Laterality: Left Cleanser Soap and Water Discharge Instruction: May shower and wash wound with dial antibacterial soap and water prior to dressing change. Peri-Wound Care Topical Primary Dressing KerraCel Ag Gelling Fiber Dressing, 4x5 in (silver alginate) Discharge Instruction: Apply silver alginate to wound bed as instructed Secondary Dressing Bordered Gauze, 2x2 in Discharge Instruction: Apply over primary dressing as directed. Secured With Compression Wrap Compression Stockings Facilities manager) Signed: 04/30/2021 2:37:05 PM By: Karl Ito Signed: 04/30/2021 5:23:14 PM By: Fonnie Mu RN Entered By: Karl Ito on 04/25/2021 10:51:17 -------------------------------------------------------------------------------- Vitals Details Patient Name: Date of Service: Tony Hicks, Tony Hicks 04/25/2021 10:45 A M Medical Record Number: [id number] Patient Account Number: [id number] Date of Birth/Sex: Treating  RN: 01-06-1966 (55 y.o. Tony Hicks, Tony Hicks Primary Care Raegan Sipp: Thad Ranger Other Clinician: Referring Arnet Hofferber: Treating Batya Citron/Extender: Vanita Panda in Treatment: 11 Vital Signs Time Taken: 10:46 Temperature (F): 97.7 Height (in): 67 Pulse (bpm): 65 Weight (lbs): 190 Respiratory Rate (breaths/min): 18 Body Mass Index (BMI): 29.8 Blood Pressure (mmHg): 137/85 Reference Range: 80 - 120 mg / dl Electronic Signature(s) Signed: 04/30/2021 2:37:05 PM By: Karl Ito Entered By: Karl Ito on 04/25/2021 10:46:25

## 2021-04-30 NOTE — Progress Notes (Signed)
Tony, Hicks ([id number]) Visit Report for 04/25/2021 Chief Complaint Document Details Patient Name: Date of Service: Tony Hicks RV EY 04/25/2021 10:45 A M Medical Record Number: [id number] Patient Account Number: [id number] Date of Birth/Sex: Treating RN: 06/11/1966 (55 y.o. Tony Merl, Hicks Primary Care Provider: Thad Ranger Other Clinician: Referring Provider: Treating Provider/Extender: Vanita Panda in Treatment: 11 Information Obtained from: Patient Chief Complaint Left lower extremity wounds Electronic Signature(s) Signed: 04/26/2021 1:40:13 PM By: Geralyn Corwin DO Entered By: Geralyn Corwin on 04/26/2021 13:35:11 -------------------------------------------------------------------------------- Debridement Details Patient Name: Date of Service: Tony Hicks, [name] 04/25/2021 10:45 A M Medical Record Number: [id number] Patient Account Number: [id number] Date of Birth/Sex: Treating RN: 1966/06/04 (55 y.o. Tony Merl, Hicks Primary Care Provider: Thad Ranger Other Clinician: Referring Provider: Treating Provider/Extender: Vanita Panda in Treatment: 11 Debridement Performed for Assessment: Wound #3 Left Calcaneus Performed By: Physician Geralyn Corwin, DO Debridement Type: Debridement Severity of Tissue Pre Debridement: Fat layer exposed Level of Consciousness (Pre-procedure): Awake and Alert Pre-procedure Verification/Time Out Yes - 10:54 Taken: Start Time: 10:55 Pain Control: Lidocaine 5% topical ointment T Area Debrided (L x W): otal 1 (cm) x 1 (cm) = 1 (cm) Tissue and other material debrided: Non-Viable, Callus, Subcutaneous Level: Skin/Subcutaneous Tissue Debridement Description: Excisional Instrument: Curette Bleeding: Minimum Hemostasis Achieved: Pressure End Time: 11:02 Response to Treatment: Procedure was tolerated well Level of Consciousness (Post- Awake and Alert procedure): Post Debridement  Measurements of Total Wound Length: (cm) 0.3 Width: (cm) 0.3 Depth: (cm) 0.3 Volume: (cm) 0.021 Character of Wound/Ulcer Post Debridement: Stable Severity of Tissue Post Debridement: Fat layer exposed Post Procedure Diagnosis Same as Pre-procedure Electronic Signature(s) Signed: 04/26/2021 1:40:13 PM By: Geralyn Corwin DO Signed: 04/30/2021 2:37:05 PM By: Karl Ito Signed: 04/30/2021 5:23:14 PM By: Fonnie Mu RN Entered By: Karl Ito on 04/25/2021 11:08:18 -------------------------------------------------------------------------------- HPI Details Patient Name: Date of Service: [name], [name] 04/25/2021 10:45 A M Medical Record Number: [id number] Patient Account Number: [id number] Date of Birth/Sex: Treating RN: May 09, 1966 (55 y.o. Tony Merl, Hicks Primary Care Provider: Thad Ranger Other Clinician: Referring Provider: Treating Provider/Extender: Vanita Panda in Treatment: 11 History of Present Illness HPI Description: Admission 8/1 [name] is a 55 year old male with a past medical history of controlled type 2 diabetes on oral agents, polysubstance abuse (tobacco, alcohol, cocaine) and peripheral arterial disease that presents with 3 left lower extremity wounds. He states that the wound to his heel started out as dried cracking that eventually became infected and worsened. He was following podiatry for this issue. This wound has been ongoing for about 3 months. There are 2 other wounds to his lateral left leg that started out as an injury from hitting his bed post. He states this happened several weeks ago. He reports mild tenderness to the wound beds. He has been using Santyl prescribed to him on discharge from hospital admission. He has no issues or complaints today. He denies signs of infection. 8/15; patient presents for 2-week follow-up. He states he is out of Santyl. He reports following up with vein and vascular.  He reports pain to the wound sites although stable. He currently denies signs of infection. 8/22; patient presents for 1 week follow-up. He states he has been using Santyl daily to the wound beds. He continues to report pain throughout the wound sites. He denies increased warmth erythema or purulent drainage. Unfortunately patient was denied for care at the pain management clinic. He states  he is following up with his primary care provider in the next few days. 9/6; patient presents for follow-up. He continues to use Santyl to the wound beds. He reports chronic pain to the wounds that is stable. He reports having a primary care appointment on October 12. He has no issues or complaints today. He overall feels well. 9/20; patient presents for follow-up. He has run out of [place] and is currently keeping the area covered. He reports improvement in pain. He has no issues or complaints today. He overall feels well. He states that he would like a letter reflecting that he is not able to work at this time T present in court. o 10/4; patient presents for follow-up. He has been using silver alginate but has run out recently. He has no issues or complaints today. He denies signs of infection. He reports improvement in wound healing. 10/21; patient presents for follow-up. He reports continued improvement in wound healing. He has been using silver alginate to the wound bed. He denies signs of infection. Electronic Signature(s) Signed: 04/26/2021 1:40:13 PM By: Geralyn Corwin DO Entered By: Geralyn Corwin on 04/26/2021 13:35:46 -------------------------------------------------------------------------------- Physical Exam Details Patient Name: Date of Service: Tony Hicks, [name] 04/25/2021 10:45 A M Medical Record Number: [id number] Patient Account Number: [id number] Date of Birth/Sex: Treating RN: 24-Apr-1966 (55 y.o. Tony Hicks Primary Care Provider: Thad Ranger Other Clinician: Referring  Provider: Treating Provider/Extender: Vanita Panda in Treatment: 11 Constitutional respirations regular, non-labored and within target range for patient.Marland Kitchen Psychiatric pleasant and cooperative. Notes Left lower extremity: to the left calcaneus there is 1 small open wound with granulation tissue and surrounding nonviable tissue and callus. No signs of infection. Post debridement there is healthy tissue present. Electronic Signature(s) Signed: 04/26/2021 1:40:13 PM By: Geralyn Corwin DO Entered By: Geralyn Corwin on 04/26/2021 13:39:19 -------------------------------------------------------------------------------- Physician Orders Details Patient Name: Date of Service: [name], [name] 04/25/2021 10:45 A M Medical Record Number: [id number] Patient Account Number: [id number] Date of Birth/Sex: Treating RN: [birth year] (55 y.o. Tony Hicks Primary Care Provider: Thad Ranger Other Clinician: Referring Provider: Treating Provider/Extender: Vanita Panda in Treatment: 11 Verbal / Phone Orders: No Diagnosis Coding ICD-10 Coding Code Description S91.302D Unspecified open wound, left foot, subsequent encounter S81.802D Unspecified open wound, left lower leg, subsequent encounter I73.9 Peripheral vascular disease, unspecified E11.621 Type 2 diabetes mellitus with foot ulcer Follow-up Appointments ppointment in 2 weeks. - with Dr. Mikey Bussing Return A Bathing/ Shower/ Hygiene May shower and wash wound with soap and water. - prior to dressing change Edema Control - Lymphedema / SCD / Other Elevate legs to the level of the heart or above for 30 minutes daily and/or when sitting, a frequency of: - throughout the day Avoid standing for long periods of time. Additional Orders / Instructions Follow Nutritious Diet - 100-120g of protein daily. Other: - Patient unable to work at this time due to wounds to left foot and leg. Reevaluating  wound and treatment plan every two weeks. Wound Treatment Wound #3 - Calcaneus Wound Laterality: Left Cleanser: Soap and Water Every Other Day/15 Days Discharge Instructions: May shower and wash wound with dial antibacterial soap and water prior to dressing change. Prim Dressing: KerraCel Ag Gelling Fiber Dressing, 4x5 in (silver alginate) Every Other Day/15 Days ary Discharge Instructions: Apply silver alginate to wound bed as instructed Secondary Dressing: Bordered Gauze, 2x2 in Every Other Day/15 Days Discharge Instructions: Apply over primary dressing as directed. Electronic Signature(s)  Signed: 04/26/2021 1:40:13 PM By: Geralyn Corwin DO Entered By: Geralyn Corwin on 04/26/2021 13:39:30 -------------------------------------------------------------------------------- Problem List Details Patient Name: Date of Service: Tony Hicks, [name] 04/25/2021 10:45 A M Medical Record Number: [id number] Patient Account Number: [id number] Date of Birth/Sex: Treating RN: 1965/10/13 (55 y.o. Tony Merl, Hicks Primary Care Provider: Thad Ranger Other Clinician: Referring Provider: Treating Provider/Extender: Vanita Panda in Treatment: 11 Active Problems ICD-10 Encounter Code Description Active Date MDM Diagnosis S91.302D Unspecified open wound, left foot, subsequent encounter 02/25/2021 No Yes S81.802D Unspecified open wound, left lower leg, subsequent encounter 02/25/2021 No Yes I73.9 Peripheral vascular disease, unspecified 02/04/2021 No Yes E11.621 Type 2 diabetes mellitus with foot ulcer 02/04/2021 No Yes Inactive Problems ICD-10 Code Description Active Date Inactive Date S91.302A Unspecified open wound, left foot, initial encounter 02/04/2021 02/04/2021 S81.802A Unspecified open wound, left lower leg, initial encounter 02/04/2021 02/04/2021 Resolved Problems Electronic Signature(s) Signed: 04/26/2021 1:40:13 PM By: Geralyn Corwin DO Entered By: Geralyn Corwin  on 04/26/2021 13:34:57 -------------------------------------------------------------------------------- Progress Note Details Patient Name: Date of Service: Tony Hicks, [name] 04/25/2021 10:45 A M Medical Record Number: [id number] Patient Account Number: [id number] Date of Birth/Sex: Treating RN: [birth year] (55 y.o. Tony Merl, Hicks Primary Care Provider: Thad Ranger Other Clinician: Referring Provider: Treating Provider/Extender: Vanita Panda in Treatment: 11 Subjective Chief Complaint Information obtained from Patient Left lower extremity wounds History of Present Illness (HPI) Admission 8/1 Mr. Germaine Shenker is a 55 year old male with a past medical history of controlled type 2 diabetes on oral agents, polysubstance abuse (tobacco, alcohol, cocaine) and peripheral arterial disease that presents with 3 left lower extremity wounds. He states that the wound to his heel started out as dried cracking that eventually became infected and worsened. He was following podiatry for this issue. This wound has been ongoing for about 3 months. There are 2 other wounds to his lateral left leg that started out as an injury from hitting his bed post. He states this happened several weeks ago. He reports mild tenderness to the wound beds. He has been using Santyl prescribed to him on discharge from hospital admission. He has no issues or complaints today. He denies signs of infection. 8/15; patient presents for 2-week follow-up. He states he is out of Santyl. He reports following up with vein and vascular. He reports pain to the wound sites although stable. He currently denies signs of infection. 8/22; patient presents for 1 week follow-up. He states he has been using Santyl daily to the wound beds. He continues to report pain throughout the wound sites. He denies increased warmth erythema or purulent drainage. Unfortunately patient was denied for care at the pain management  clinic. He states he is following up with his primary care provider in the next few days. 9/6; patient presents for follow-up. He continues to use Santyl to the wound beds. He reports chronic pain to the wounds that is stable. He reports having a primary care appointment on October 12. He has no issues or complaints today. He overall feels well. 9/20; patient presents for follow-up. He has run out of [place] and is currently keeping the area covered. He reports improvement in pain. He has no issues or complaints today. He overall feels well. He states that he would like a letter reflecting that he is not able to work at this time T present in court. o 10/4; patient presents for follow-up. He has been using silver alginate but has run out recently. He  has no issues or complaints today. He denies signs of infection. He reports improvement in wound healing. 10/21; patient presents for follow-up. He reports continued improvement in wound healing. He has been using silver alginate to the wound bed. He denies signs of infection. Patient History Information obtained from Patient. Family History Unknown History. Social History Current every day smoker - 1 cigarette per day, Marital Status - Single, Alcohol Use - Moderate - Beer, Drug Use - Prior History - Cocaine, Caffeine Use - Rarely. Medical History Cardiovascular Patient has history of Peripheral Arterial Disease Endocrine Patient has history of Type II Diabetes Neurologic Patient has history of Neuropathy Objective Constitutional respirations regular, non-labored and within target range for patient.. Vitals Time Taken: 10:46 AM, Height: 67 in, Weight: 190 lbs, BMI: 29.8, Temperature: 97.7 F, Pulse: 65 bpm, Respiratory Rate: 18 breaths/min, Blood Pressure: 137/85 mmHg. Psychiatric pleasant and cooperative. General Notes: Left lower extremity: to the left calcaneus there is 1 small open wound with granulation tissue and surrounding  nonviable tissue and callus. No signs of infection. Post debridement there is healthy tissue present. Integumentary (Hair, Skin) Wound #3 status is Open. Original cause of wound was Gradually Appeared. The date acquired was: 11/04/2020. The wound has been in treatment 11 weeks. The wound is located on the Left Calcaneus. The wound measures 0.3cm length x 0.3cm width x 0.3cm depth; 0.071cm^2 area and 0.021cm^3 volume. There is Fat Layer (Subcutaneous Tissue) exposed. There is no tunneling or undermining noted. There is a medium amount of serosanguineous drainage noted. The wound margin is distinct with the outline attached to the wound base. There is large (67-100%) red, pink granulation within the wound bed. There is no necrotic tissue within the wound bed. Assessment Active Problems ICD-10 Unspecified open wound, left foot, subsequent encounter Unspecified open wound, left lower leg, subsequent encounter Peripheral vascular disease, unspecified Type 2 diabetes mellitus with foot ulcer Patient's wound continues to show improvement in healing. I debrided nonviable tissue. There is still a very small open wound with granulation tissue present. I recommended continuing silver alginate to this. Follow-up in 2 weeks. I am hopeful he will be closed by then. Procedures Wound #3 Pre-procedure diagnosis of Wound #3 is a Diabetic Wound/Ulcer of the Lower Extremity located on the Left Calcaneus .Severity of Tissue Pre Debridement is: Fat layer exposed. There was a Excisional Skin/Subcutaneous Tissue Debridement with a total area of 1 sq cm performed by Geralyn Corwin, DO. With the following instrument(s): Curette to remove Non-Viable tissue/material. Material removed includes Callus and Subcutaneous Tissue and after achieving pain control using Lidocaine 5% topical ointment. No specimens were taken. A time out was conducted at 10:54, prior to the start of the procedure. A Minimum amount of bleeding was  controlled with Pressure. The procedure was tolerated well. Post Debridement Measurements: 0.3cm length x 0.3cm width x 0.3cm depth; 0.021cm^3 volume. Character of Wound/Ulcer Post Debridement is stable. Severity of Tissue Post Debridement is: Fat layer exposed. Post procedure Diagnosis Wound #3: Same as Pre-Procedure Plan Follow-up Appointments: Return Appointment in 2 weeks. - with Dr. Mikey Bussing Bathing/ Shower/ Hygiene: May shower and wash wound with soap and water. - prior to dressing change Edema Control - Lymphedema / SCD / Other: Elevate legs to the level of the heart or above for 30 minutes daily and/or when sitting, a frequency of: - throughout the day Avoid standing for long periods of time. Additional Orders / Instructions: Follow Nutritious Diet - 100-120g of protein daily. Other: - Patient unable  to work at this time due to wounds to left foot and leg. Reevaluating wound and treatment plan every two weeks. WOUND #3: - Calcaneus Wound Laterality: Left Cleanser: Soap and Water Every Other Day/15 Days Discharge Instructions: May shower and wash wound with dial antibacterial soap and water prior to dressing change. Prim Dressing: KerraCel Ag Gelling Fiber Dressing, 4x5 in (silver alginate) Every Other Day/15 Days ary Discharge Instructions: Apply silver alginate to wound bed as instructed Secondary Dressing: Bordered Gauze, 2x2 in Every Other Day/15 Days Discharge Instructions: Apply over primary dressing as directed. 1. In office sharp debridement 2. Silver alginate 3. Follow-up in 2 weeks Electronic Signature(s) Signed: 04/26/2021 1:40:13 PM By: Geralyn Corwin DO Entered By: Geralyn Corwin on 04/26/2021 13:39:42 -------------------------------------------------------------------------------- HxROS Details Patient Name: Date of Service: [name], [name] 04/25/2021 10:45 A M Medical Record Number: [id number] Patient Account Number: [id number] Date of Birth/Sex: Treating  RN: 12-16-1965 (55 y.o. Tony Merl, Hicks Primary Care Provider: Thad Ranger Other Clinician: Referring Provider: Treating Provider/Extender: Vanita Panda in Treatment: 11 Information Obtained From Patient Cardiovascular Medical History: Positive for: Peripheral Arterial Disease Endocrine Medical History: Positive for: Type II Diabetes Time with diabetes: 1 year Treated with: Oral agents Blood sugar tested every day: No Neurologic Medical History: Positive for: Neuropathy Immunizations Pneumococcal Vaccine: Received Pneumococcal Vaccination: No Implantable Devices None Family and Social History Unknown History: Yes; Current every day smoker - 1 cigarette per day; Marital Status - Single; Alcohol Use: Moderate - Beer; Drug Use: Prior History - Cocaine; Caffeine Use: Rarely; Financial Concerns: No; Food, Clothing or Shelter Needs: No; Support System Lacking: No; Transportation Concerns: No Electronic Signature(s) Signed: 04/26/2021 1:40:13 PM By: Geralyn Corwin DO Signed: 04/30/2021 5:23:14 PM By: Fonnie Mu RN Entered By: Geralyn Corwin on 04/26/2021 13:35:53 -------------------------------------------------------------------------------- SuperBill Details Patient Name: Date of Service: Tony Hicks, [name] 04/25/2021 Medical Record Number: [id number] Patient Account Number: [id number] Date of Birth/Sex: Treating RN: 1965/11/09 (54 y.o. Tony Merl, Hicks Primary Care Provider: Thad Ranger Other Clinician: Referring Provider: Treating Provider/Extender: Vanita Panda in Treatment: 11 Diagnosis Coding ICD-10 Codes Code Description S91.302D Unspecified open wound, left foot, subsequent encounter S81.802D Unspecified open wound, left lower leg, subsequent encounter I73.9 Peripheral vascular disease, unspecified E11.621 Type 2 diabetes mellitus with foot ulcer Facility Procedures CPT4 Code:  [id number] Description: 11042 - DEB SUBQ TISSUE 20 SQ CM/< ICD-10 Diagnosis Description S91.302D Unspecified open wound, left foot, subsequent encounter Modifier: Quantity: 1 Physician Procedures : CPT4 Code Description Modifier [id number] 11042 - WC PHYS SUBQ TISS 20 SQ CM ICD-10 Diagnosis Description S91.302D Unspecified open wound, left foot, subsequent encounter Quantity: 1 Electronic Signature(s) Signed: 04/26/2021 1:40:13 PM By: Geralyn Corwin DO Entered By: Geralyn Corwin on 04/26/2021 13:39:49

## 2021-05-02 ENCOUNTER — Other Ambulatory Visit: Payer: Self-pay

## 2021-05-06 ENCOUNTER — Other Ambulatory Visit: Payer: Self-pay

## 2021-05-09 ENCOUNTER — Encounter (HOSPITAL_BASED_OUTPATIENT_CLINIC_OR_DEPARTMENT_OTHER): Payer: 59 | Admitting: Internal Medicine

## 2021-05-13 ENCOUNTER — Other Ambulatory Visit: Payer: Self-pay

## 2021-05-16 ENCOUNTER — Encounter (HOSPITAL_BASED_OUTPATIENT_CLINIC_OR_DEPARTMENT_OTHER): Payer: 59 | Admitting: Internal Medicine

## 2021-05-20 ENCOUNTER — Other Ambulatory Visit: Payer: Self-pay

## 2021-05-20 ENCOUNTER — Other Ambulatory Visit: Payer: Self-pay | Admitting: Family Medicine

## 2021-05-20 DIAGNOSIS — G621 Alcoholic polyneuropathy: Secondary | ICD-10-CM

## 2021-05-20 NOTE — Telephone Encounter (Signed)
Requested medication (s) are due for refill today - unsure  Requested medication (s) are on the active medication list -yes  Future visit scheduled -no  Last refill: 01/30/21 #180  Notes to clinic: Request RF: not a patient at [facility]- was seen at [facility]  Requested Prescriptions  Pending Prescriptions Disp Refills   gabapentin (NEURONTIN) 300 MG capsule [Pharmacy Med Name: GABAPENTIN 300 MG CAPSULE] 60 capsule     Sig: TAKE 1 CAPSULE BY MOUTH TWICE A DAY     Neurology: Anticonvulsants - gabapentin Failed - 05/20/2021  9:35 AM      Failed - Valid encounter within last 12 months    Recent Outpatient Visits   None     Future Appointments             In 1 week Sagardia, Eilleen Kempf, MD [facility] [place]   In 3 weeks Barbette Merino, NP [facility]               Requested Prescriptions  Pending Prescriptions Disp Refills   gabapentin (NEURONTIN) 300 MG capsule [Pharmacy Med Name: GABAPENTIN 300 MG CAPSULE] 60 capsule     Sig: TAKE 1 CAPSULE BY MOUTH TWICE A DAY     Neurology: Anticonvulsants - gabapentin Failed - 05/20/2021  9:35 AM      Failed - Valid encounter within last 12 months    Recent Outpatient Visits   None     Future Appointments             In 1 week Sagardia, Eilleen Kempf, MD [facility] [place]   In 3 weeks Barbette Merino, NP [facility]

## 2021-05-21 ENCOUNTER — Other Ambulatory Visit: Payer: Self-pay

## 2021-05-23 ENCOUNTER — Other Ambulatory Visit: Payer: Self-pay

## 2021-05-23 ENCOUNTER — Encounter (HOSPITAL_BASED_OUTPATIENT_CLINIC_OR_DEPARTMENT_OTHER): Payer: 59 | Attending: Internal Medicine | Admitting: Internal Medicine

## 2021-05-23 DIAGNOSIS — I739 Peripheral vascular disease, unspecified: Secondary | ICD-10-CM | POA: Diagnosis not present

## 2021-05-23 DIAGNOSIS — E1151 Type 2 diabetes mellitus with diabetic peripheral angiopathy without gangrene: Secondary | ICD-10-CM | POA: Diagnosis not present

## 2021-05-23 DIAGNOSIS — E1140 Type 2 diabetes mellitus with diabetic neuropathy, unspecified: Secondary | ICD-10-CM | POA: Insufficient documentation

## 2021-05-23 DIAGNOSIS — S91302D Unspecified open wound, left foot, subsequent encounter: Secondary | ICD-10-CM

## 2021-05-23 DIAGNOSIS — E11621 Type 2 diabetes mellitus with foot ulcer: Secondary | ICD-10-CM | POA: Diagnosis present

## 2021-05-24 NOTE — Progress Notes (Signed)
Tony, Hicks ([id number]) Visit Report for 05/23/2021 Chief Complaint Document Details Patient Name: Date of Service: Tony Hicks [name] 05/23/2021 3:15 PM Medical Record Number: [id number] Patient Account Number: [id number] Date of Birth/Sex: Treating RN: December 20, 1965 (55 y.o. Tony Hicks Primary Care Provider: Thad Ranger Other Clinician: Referring Provider: Treating Provider/Extender: Vanita Panda in Treatment: 15 Information Obtained from: Patient Chief Complaint Left lower extremity wounds Electronic Signature(s) Signed: 05/23/2021 4:06:55 PM By: Geralyn Corwin DO Entered By: Geralyn Corwin on 05/23/2021 16:04:23 -------------------------------------------------------------------------------- HPI Details Patient Name: Date of Service: Tony Hicks 05/23/2021 3:15 PM Medical Record Number: [id number] Patient Account Number: [id number] Date of Birth/Sex: Treating RN: [birth year] (55 y.o. Tony Hicks Primary Care Provider: Thad Ranger Other Clinician: Referring Provider: Treating Provider/Extender: Vanita Panda in Treatment: 15 History of Present Illness HPI Description: Admission 8/1 Tony Hicks is a 55 year old male with a past medical history of controlled type 2 diabetes on oral agents, polysubstance abuse (tobacco, alcohol, cocaine) and peripheral arterial disease that presents with 3 left lower extremity wounds. He states that the wound to his heel started out as dried cracking that eventually became infected and worsened. He was following podiatry for this issue. This wound has been ongoing for about 3 months. There are 2 other wounds to his lateral left leg that started out as an injury from hitting his bed post. He states this happened several weeks ago. He reports mild tenderness to the wound beds. He has been using Santyl prescribed to him on discharge from hospital admission. He has no issues or  complaints today. He denies signs of infection. 8/15; patient presents for 2-week follow-up. He states he is out of Santyl. He reports following up with vein and vascular. He reports pain to the wound sites although stable. He currently denies signs of infection. 8/22; patient presents for 1 week follow-up. He states he has been using Santyl daily to the wound beds. He continues to report pain throughout the wound sites. He denies increased warmth erythema or purulent drainage. Unfortunately patient was denied for care at the pain management clinic. He states he is following up with his primary care provider in the next few days. 9/6; patient presents for follow-up. He continues to use Santyl to the wound beds. He reports chronic pain to the wounds that is stable. He reports having a primary care appointment on October 12. He has no issues or complaints today. He overall feels well. 9/20; patient presents for follow-up. He has run out of [place] and is currently keeping the area covered. He reports improvement in pain. He has no issues or complaints today. He overall feels well. He states that he would like a letter reflecting that he is not able to work at this time T present in court. o 10/4; patient presents for follow-up. He has been using silver alginate but has run out recently. He has no issues or complaints today. He denies signs of infection. He reports improvement in wound healing. 10/21; patient presents for follow-up. He reports continued improvement in wound healing. He has been using silver alginate to the wound bed. He denies signs of infection. 11/17; patient presents for follow-up. He reports that the wounds have healed. He has no issues or complaints today. He denies signs of infection. Electronic Signature(s) Signed: 05/23/2021 4:06:55 PM By: Geralyn Corwin DO Entered By: Geralyn Corwin on 05/23/2021  16:04:45 -------------------------------------------------------------------------------- Physical Exam Details Patient Name: Date of Service: Tony  RK, HA [name] 05/23/2021 3:15 PM Medical Record Number: [id number] Patient Account Number: [id number] Date of Birth/Sex: Treating RN: 22-Apr-1966 (55 y.o. Tony Hicks Primary Care Provider: Thad Ranger Other Clinician: Referring Provider: Treating Provider/Extender: Vanita Panda in Treatment: 15 Constitutional respirations regular, non-labored and within target range for patient.. Cardiovascular 2+ dorsalis pedis/posterior tibialis pulses. Psychiatric pleasant and cooperative. Notes Left lower extremity: No open wounds. Epithelialization to previous wound sites. No signs of infection. Electronic Signature(s) Signed: 05/23/2021 4:06:55 PM By: Geralyn Corwin DO Entered By: Geralyn Corwin on 05/23/2021 16:05:31 -------------------------------------------------------------------------------- Physician Orders Details Patient Name: Date of Service: Tony Hicks 05/23/2021 3:15 PM Medical Record Number: [id number] Patient Account Number: [id number] Date of Birth/Sex: Treating RN: [birth year] (55 y.o. Tony Hicks Primary Care Provider: Thad Ranger Other Clinician: Referring Provider: Treating Provider/Extender: Vanita Panda in Treatment: 15 Verbal / Phone Orders: No Diagnosis Coding ICD-10 Coding Code Description S91.302D Unspecified open wound, left foot, subsequent encounter S81.802D Unspecified open wound, left lower leg, subsequent encounter I73.9 Peripheral vascular disease, unspecified E11.621 Type 2 diabetes mellitus with foot ulcer Discharge From [facility] Discharge from Wound Care Center - Wound healed!! Electronic Signature(s) Signed: 05/23/2021 4:06:55 PM By: Geralyn Corwin DO Entered By: Geralyn Corwin on 05/23/2021  16:05:44 -------------------------------------------------------------------------------- Problem List Details Patient Name: Date of Service: Tony Hicks 05/23/2021 3:15 PM Medical Record Number: [id number] Patient Account Number: [id number] Date of Birth/Sex: Treating RN: May 04, 1966 (55 y.o. Tony Hicks Primary Care Provider: Thad Ranger Other Clinician: Referring Provider: Treating Provider/Extender: Vanita Panda in Treatment: 15 Active Problems ICD-10 Encounter Code Description Active Date MDM Diagnosis S91.302D Unspecified open wound, left foot, subsequent encounter 02/25/2021 No Yes S81.802D Unspecified open wound, left lower leg, subsequent encounter 02/25/2021 No Yes I73.9 Peripheral vascular disease, unspecified 02/04/2021 No Yes E11.621 Type 2 diabetes mellitus with foot ulcer 02/04/2021 No Yes Inactive Problems ICD-10 Code Description Active Date Inactive Date S91.302A Unspecified open wound, left foot, initial encounter 02/04/2021 02/04/2021 S81.802A Unspecified open wound, left lower leg, initial encounter 02/04/2021 02/04/2021 Resolved Problems Electronic Signature(s) Signed: 05/23/2021 4:06:55 PM By: Geralyn Corwin DO Entered By: Geralyn Corwin on 05/23/2021 16:04:08 -------------------------------------------------------------------------------- Progress Note Details Patient Name: Date of Service: Tony Hicks 05/23/2021 3:15 PM Medical Record Number: [id number] Patient Account Number: [id number] Date of Birth/Sex: Treating RN: 08/16/1965 (55 y.o. Tony Hicks Primary Care Provider: Thad Ranger Other Clinician: Referring Provider: Treating Provider/Extender: Vanita Panda in Treatment: 15 Subjective Chief Complaint Information obtained from Patient Left lower extremity wounds History of Present Illness (HPI) Admission 8/1 Tony Hicks is a 55 year old male with a past medical history of  controlled type 2 diabetes on oral agents, polysubstance abuse (tobacco, alcohol, cocaine) and peripheral arterial disease that presents with 3 left lower extremity wounds. He states that the wound to his heel started out as dried cracking that eventually became infected and worsened. He was following podiatry for this issue. This wound has been ongoing for about 3 months. There are 2 other wounds to his lateral left leg that started out as an injury from hitting his bed post. He states this happened several weeks ago. He reports mild tenderness to the wound beds. He has been using Santyl prescribed to him on discharge from hospital admission. He has no issues or complaints today. He denies signs of infection. 8/15; patient presents for 2-week follow-up. He states he is out of Santyl.  He reports following up with vein and vascular. He reports pain to the wound sites although stable. He currently denies signs of infection. 8/22; patient presents for 1 week follow-up. He states he has been using Santyl daily to the wound beds. He continues to report pain throughout the wound sites. He denies increased warmth erythema or purulent drainage. Unfortunately patient was denied for care at the pain management clinic. He states he is following up with his primary care provider in the next few days. 9/6; patient presents for follow-up. He continues to use Santyl to the wound beds. He reports chronic pain to the wounds that is stable. He reports having a primary care appointment on October 12. He has no issues or complaints today. He overall feels well. 9/20; patient presents for follow-up. He has run out of [place] and is currently keeping the area covered. He reports improvement in pain. He has no issues or complaints today. He overall feels well. He states that he would like a letter reflecting that he is not able to work at this time T present in court. o 10/4; patient presents for follow-up. He has been  using silver alginate but has run out recently. He has no issues or complaints today. He denies signs of infection. He reports improvement in wound healing. 10/21; patient presents for follow-up. He reports continued improvement in wound healing. He has been using silver alginate to the wound bed. He denies signs of infection. 11/17; patient presents for follow-up. He reports that the wounds have healed. He has no issues or complaints today. He denies signs of infection. Patient History Information obtained from Patient. Family History Unknown History. Social History Current every day smoker - 1 cigarette per day, Marital Status - Single, Alcohol Use - Moderate - Beer, Drug Use - Prior History - Cocaine, Caffeine Use - Rarely. Medical History Cardiovascular Patient has history of Peripheral Arterial Disease Endocrine Patient has history of Type II Diabetes Neurologic Patient has history of Neuropathy Objective Constitutional respirations regular, non-labored and within target range for patient.. Vitals Time Taken: 3:27 PM, Height: 67 in, Weight: 190 lbs, BMI: 29.8, Temperature: 98.7 F, Pulse: 80 bpm, Respiratory Rate: 18 breaths/min, Blood Pressure: 119/73 mmHg. Cardiovascular 2+ dorsalis pedis/posterior tibialis pulses. Psychiatric pleasant and cooperative. General Notes: Left lower extremity: No open wounds. Epithelialization to previous wound sites. No signs of infection. Integumentary (Hair, Skin) Wound #3 status is Healed - Epithelialized. Original cause of wound was Gradually Appeared. The date acquired was: 11/04/2020. The wound has been in treatment 15 weeks. The wound is located on the Left Calcaneus. The wound measures 0cm length x 0cm width x 0cm depth; 0cm^2 area and 0cm^3 volume. There is no tunneling or undermining noted. There is a none present amount of drainage noted. The wound margin is distinct with the outline attached to the wound base. There is no granulation  within the wound bed. There is no necrotic tissue within the wound bed. Assessment Active Problems ICD-10 Unspecified open wound, left foot, subsequent encounter Unspecified open wound, left lower leg, subsequent encounter Peripheral vascular disease, unspecified Type 2 diabetes mellitus with foot ulcer Patient did well with silver alginate. He no longer has open wounds. He can follow-up as needed. Plan Discharge From [facility]: Discharge from Wound Care Center - Wound healed!! 1. Discharge from clinic due to closed wound 2. Follow-up as needed Electronic Signature(s) Signed: 05/23/2021 4:06:55 PM By: Geralyn Corwin DO Entered By: Geralyn Corwin on 05/23/2021 16:06:18 -------------------------------------------------------------------------------- HxROS Details Patient  Name: Date of Service: Tony Hicks 05/23/2021 3:15 PM Medical Record Number: [id number] Patient Account Number: [id number] Date of Birth/Sex: Treating RN: 12/11/1965 (55 y.o. Tony Hicks Primary Care Provider: Thad Ranger Other Clinician: Referring Provider: Treating Provider/Extender: Vanita Panda in Treatment: 15 Information Obtained From Patient Cardiovascular Medical History: Positive for: Peripheral Arterial Disease Endocrine Medical History: Positive for: Type II Diabetes Time with diabetes: 1 year Treated with: Oral agents Blood sugar tested every day: No Neurologic Medical History: Positive for: Neuropathy Immunizations Pneumococcal Vaccine: Received Pneumococcal Vaccination: No Implantable Devices None Family and Social History Unknown History: Yes; Current every day smoker - 1 cigarette per day; Marital Status - Single; Alcohol Use: Moderate - Beer; Drug Use: Prior History - Cocaine; Caffeine Use: Rarely; Financial Concerns: No; Food, Clothing or Shelter Needs: No; Support System Lacking: No; Transportation Concerns: No Electronic  Signature(s) Signed: 05/23/2021 4:06:55 PM By: Geralyn Corwin DO Signed: 05/23/2021 5:23:58 PM By: Zandra Abts RN, BSN Entered By: Geralyn Corwin on 05/23/2021 16:04:51 -------------------------------------------------------------------------------- SuperBill Details Patient Name: Date of Service: Tony Hicks 05/23/2021 Medical Record Number: [id number] Patient Account Number: [id number] Date of Birth/Sex: Treating RN: [birth year] (55 y.o. Tony Hicks Primary Care Provider: Thad Ranger Other Clinician: Referring Provider: Treating Provider/Extender: Vanita Panda in Treatment: 15 Diagnosis Coding ICD-10 Codes Code Description S91.302D Unspecified open wound, left foot, subsequent encounter S81.802D Unspecified open wound, left lower leg, subsequent encounter I73.9 Peripheral vascular disease, unspecified E11.621 Type 2 diabetes mellitus with foot ulcer Facility Procedures CPT4 Code: [id number] Description: 99213 - WOUND CARE VISIT-LEV 3 EST PT Modifier: Quantity: 1 Physician Procedures : CPT4 Code Description Modifier [id number] - WC PHYS LEVEL 3 - EST PT ICD-10 Diagnosis Description S91.302D Unspecified open wound, left foot, subsequent encounter I73.9 Peripheral vascular disease, unspecified E11.621 Type 2 diabetes mellitus with  foot ulcer Quantity: 1 Electronic Signature(s) Signed: 05/23/2021 5:23:58 PM By: Zandra Abts RN, BSN Signed: 05/24/2021 9:16:29 AM By: Geralyn Corwin DO Previous Signature: 05/23/2021 4:06:55 PM Version By: Geralyn Corwin DO Entered By: Zandra Abts on 05/23/2021 17:21:00

## 2021-05-27 ENCOUNTER — Encounter: Payer: Self-pay | Admitting: Emergency Medicine

## 2021-05-27 ENCOUNTER — Telehealth: Payer: Self-pay

## 2021-05-27 ENCOUNTER — Other Ambulatory Visit: Payer: Self-pay

## 2021-05-27 ENCOUNTER — Ambulatory Visit (INDEPENDENT_AMBULATORY_CARE_PROVIDER_SITE_OTHER): Payer: 59 | Admitting: Emergency Medicine

## 2021-05-27 VITALS — BP 124/78 | HR 62 | Ht 66.0 in | Wt 199.0 lb

## 2021-05-27 DIAGNOSIS — F1910 Other psychoactive substance abuse, uncomplicated: Secondary | ICD-10-CM | POA: Diagnosis not present

## 2021-05-27 DIAGNOSIS — E118 Type 2 diabetes mellitus with unspecified complications: Secondary | ICD-10-CM

## 2021-05-27 DIAGNOSIS — I739 Peripheral vascular disease, unspecified: Secondary | ICD-10-CM

## 2021-05-27 DIAGNOSIS — E11621 Type 2 diabetes mellitus with foot ulcer: Secondary | ICD-10-CM | POA: Diagnosis not present

## 2021-05-27 DIAGNOSIS — L97429 Non-pressure chronic ulcer of left heel and midfoot with unspecified severity: Secondary | ICD-10-CM

## 2021-05-27 NOTE — Progress Notes (Signed)
Tony Hicks 55 y.o.   Chief Complaint  Patient presents with   New Patient (Initial Visit)    HISTORY OF PRESENT ILLNESS: This is a 55 y.o. male first visit to this office.  Not sure why he is here. States this appointment "is on my papers". Recently established care with Thad Ranger, NP on 03/14/2021. Also sees Dr. Mikey Bussing for wound care of diabetic ulcer. Most recent office visit notes reviewed.   HPI   Prior to Admission medications   Medication Sig Start Date End Date Taking? Authorizing Provider  aspirin 81 MG EC tablet Take 1 tablet (81 mg total) by mouth daily. Swallow whole. 01/20/21  Yes Zigmund Daniel., MD  atorvastatin (LIPITOR) 40 MG tablet Take 1 tablet (40 mg total) by mouth daily. 01/20/21 05/27/21 Yes Zigmund Daniel., MD  citalopram (CELEXA) 20 MG tablet Take 1 tablet (20 mg total) by mouth daily. 03/14/21  Yes Barbette Merino, NP  collagenase (SANTYL) ointment Apply topically daily to left ankle wound, nickel thick, followed by wet to dry dressing. 03/14/21  Yes Barbette Merino, NP  gabapentin (NEURONTIN) 300 MG capsule TAKE 1 CAPSULE BY MOUTH TWICE A DAY 05/22/21  Yes Barbette Merino, NP  ibuprofen (ADVIL) 600 MG tablet Take 1 tablet (600 mg total) by mouth every 8 (eight) hours as needed. 12/26/20  Yes Zonia Kief, Amy J, NP  metFORMIN (GLUCOPHAGE) 500 MG tablet Take 1 tablet (500 mg total) by mouth daily with breakfast. 01/30/21 07/23/21 Yes Zonia Kief, Amy J, NP  nicotine (NICODERM CQ) 21 mg/24hr patch Place 1 patch (21 mg total) onto the skin daily. 03/14/21  Yes Barbette Merino, NP  pantoprazole (PROTONIX) 40 MG tablet Take 1 tablet (40 mg total) by mouth daily. 01/30/21 06/20/21 Yes Zonia Kief, Amy J, NP  bacitracin ointment Apply 1 application topically 2 (two) times daily. Patient not taking: Reported on 05/27/2021 12/26/20   Rema Fendt, NP  ferrous sulfate 325 (65 FE) MG tablet Take 1 tablet (325 mg total) by mouth daily with breakfast. 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   Syphilis 02/18/2019    Past Medical History:  Diagnosis Date   Diabetes mellitus without complication (HCC)    Syphilis    Treated  02/14/2019    Past Surgical History:  Procedure Laterality Date   ABDOMINAL AORTOGRAM W/LOWER EXTREMITY N/A 01/11/2021   Procedure: ABDOMINAL AORTOGRAM W/LOWER EXTREMITY;  Surgeon: Leonie Douglas, MD;  Location: [facility];  Service: Cardiovascular;  Laterality: N/A;   NO PAST SURGERIES     THROMBECTOMY OF BYPASS GRAFT FEMORAL- TIBIAL ARTERY Left 01/14/2021   Procedure: LEFT ABOVE KNEE TO  POSTERIOR TIBIAL BYPASS GRAFT WITH GREATER SAPHENOUS VEIN;  Surgeon: Cephus Shelling, MD;  Location: [facility] OR;  Service: Vascular;  Laterality: Left;    Social History   Socioeconomic History   Marital status: Single    Spouse name: Not on file   Number of children: Not on file   Years of education: Not on file   Highest education level: Not on file  Occupational History   Occupation: Holiday representative  Tobacco Use   Smoking status: Every Day    Packs/day: 0.50    Types: Cigarettes   Smokeless tobacco: Never  Vaping Use   Vaping Use: Never used  Substance and Sexual Activity   Alcohol use: Yes    Alcohol/week: 24.0 standard drinks    Types: 4 Cans of beer, 20 Standard drinks or equivalent per week    Comment: Approx 20-25 beers week   Drug use: Yes    Frequency: 1.0 times per week    Types: Cocaine, Marijuana    Comment: smoke crack   Sexual activity: Not Currently  Other Topics Concern   Not on file   Social History Narrative   Not on file   Social Determinants of Health   Financial Resource Strain: Not on file  Food Insecurity: Not on file  Transportation Needs: Not on file  Physical Activity: Not on file  Stress: Not on file  Social Connections: Not on file  Intimate Partner Violence: Not on file    Family History  Family history unknown: Yes     Review of Systems  Constitutional:  Negative for chills and fever.  HENT:  Negative for congestion and sore throat.   Respiratory:  Negative for cough.   Cardiovascular:  Negative for chest pain.  Gastrointestinal:  Negative for nausea and vomiting.  All other systems reviewed and are negative.  Today's Vitals   05/27/21 1304  BP: 124/78  Pulse: 62  SpO2: 98%  Weight: 199 lb (90.3 kg)  Height: 5\' 6"  (1.676 m)   Body mass index is 32.12 kg/m.  Physical Exam Vitals reviewed.  Constitutional:      Appearance: Normal appearance.  HENT:     Head: Normocephalic.  Cardiovascular:     Rate and Rhythm: Normal rate.  Pulmonary:     Effort: Pulmonary effort is normal.  Skin:    General: Skin is warm and dry.  Neurological:     Mental Status: He is alert and oriented to person, place, and time.     ASSESSMENT & PLAN: Clinically stable.  Unsure why he was referred here today.  No acute medical problems.  Several chronic medical problems.  Patient has established PCP.  Needs to follow-up with her as scheduled.  Problem List Items Addressed This Visit       Cardiovascular and Mediastinum   PAD (peripheral artery disease) (HCC)     Endocrine   Diabetic foot ulcer (HCC)   Diabetes mellitus type 2, controlled (HCC) - Primary     Other   Polysubstance abuse [facility])   Patient Instructions  Health Maintenance, Male Adopting a healthy lifestyle and getting preventive care are important in promoting health and wellness. Ask your health care provider about: The right schedule for you to have regular tests and  exams. Things you can do on your own to prevent diseases and keep yourself healthy. What should I know about diet, weight, and exercise? Eat a healthy diet  Eat a diet that includes plenty of vegetables, fruits, low-fat dairy products, and lean protein. Do not eat a lot of foods that are high in solid fats, added sugars, or sodium. Maintain a healthy weight Body mass index (BMI) is a measurement that can be used to identify possible weight problems. It estimates body fat based on height and weight. Your health care provider can help determine your BMI and help you achieve or maintain a healthy weight. Get regular exercise Get  regular exercise. This is one of the most important things you can do for your health. Most adults should: Exercise for at least 150 minutes each week. The exercise should increase your heart rate and make you sweat (moderate-intensity exercise). Do strengthening exercises at least twice a week. This is in addition to the moderate-intensity exercise. Spend less time sitting. Even light physical activity can be beneficial. Watch cholesterol and blood lipids Have your blood tested for lipids and cholesterol at 54 years of age, then have this test every 5 years. You may need to have your cholesterol levels checked more often if: Your lipid or cholesterol levels are high. You are older than 54 years of age. You are at high risk for heart disease. What should I know about cancer screening? Many types of cancers can be detected early and may often be prevented. Depending on your health history and family history, you may need to have cancer screening at various ages. This may include screening for: Colorectal cancer. Prostate cancer. Skin cancer. Lung cancer. What should I know about heart disease, diabetes, and high blood pressure? Blood pressure and heart disease High blood pressure causes heart disease and increases the risk of stroke. This is more likely to develop in  people who have high blood pressure readings or are overweight. Talk with your health care provider about your target blood pressure readings. Have your blood pressure checked: Every 3-5 years if you are 20-58 years of age. Every year if you are 11 years old or older. If you are between the ages of 36 and 44 and are a current or former smoker, ask your health care provider if you should have a one-time screening for abdominal aortic aneurysm (AAA). Diabetes Have regular diabetes screenings. This checks your fasting blood sugar level. Have the screening done: Once every three years after age 10 if you are at a normal weight and have a low risk for diabetes. More often and at a younger age if you are overweight or have a high risk for diabetes. What should I know about preventing infection? Hepatitis B If you have a higher risk for hepatitis B, you should be screened for this virus. Talk with your health care provider to find out if you are at risk for hepatitis B infection. Hepatitis C Blood testing is recommended for: Everyone born from [birth year] through 1964. Anyone with known risk factors for hepatitis C. Sexually transmitted infections (STIs) You should be screened each year for STIs, including gonorrhea and chlamydia, if: You are sexually active and are younger than 54 years of age. You are older than 54 years of age and your health care provider tells you that you are at risk for this type of infection. Your sexual activity has changed since you were last screened, and you are at increased risk for chlamydia or gonorrhea. Ask your health care provider if you are at risk. Ask your health care provider about whether you are at high risk for HIV. Your health care provider may recommend a prescription medicine to help prevent HIV infection. If you choose to take medicine to prevent HIV, you should first get tested for HIV. You should then be tested every 3 months for as long as you are taking the  medicine. Follow these instructions at home: Alcohol use Do not drink alcohol if your health care provider tells you not to drink. If you drink alcohol: Limit how much you have to 0-2 drinks a day. Know how much alcohol  is in your drink. In the [place] one drink equals one 12 oz bottle of beer (355 mL), one 5 oz glass of wine (148 mL), or one 1 oz glass of hard liquor (44 mL). Lifestyle Do not use any products that contain nicotine or tobacco. These products include cigarettes, chewing tobacco, and vaping devices, such as e-cigarettes. If you need help quitting, ask your health care provider. Do not use street drugs. Do not share needles. Ask your health care provider for help if you need support or information about quitting drugs. General instructions Schedule regular health, dental, and eye exams. Stay current with your vaccines. Tell your health care provider if: You often feel depressed. You have ever been abused or do not feel safe at home. Summary Adopting a healthy lifestyle and getting preventive care are important in promoting health and wellness. Follow your health care provider's instructions about healthy diet, exercising, and getting tested or screened for diseases. Follow your health care provider's instructions on monitoring your cholesterol and blood pressure. This information is not intended to replace advice given to you by your health care provider. Make sure you discuss any questions you have with your health care provider. Document Revised: 11/12/2020 Document Reviewed: 11/12/2020 Elsevier Patient Education  2022 [employer]     Edwina Barth, MD [facility]

## 2021-05-27 NOTE — Patient Instructions (Signed)

## 2021-05-27 NOTE — Progress Notes (Signed)
Tony Hicks ([id number]) Visit Report for 05/23/2021 Arrival Information Details Patient Name: Date of Service: Tony Hicks 05/23/2021 3:15 PM Medical Record Number: [id number] Patient Account Number: [id number] Date of Birth/Sex: Treating RN: [birth year] (55 y.o. M) Primary Care Anessia Oakland: Thad Ranger Other Clinician: Referring Enoch Moffa: Treating Haelee Bolen/Extender: Vanita Panda in Treatment: 15 Visit Information History Since Last Visit All ordered tests and consults were completed: Yes Patient Arrived: Ambulatory Added or deleted any medications: No Arrival Time: 15:25 Any new allergies or adverse reactions: No Accompanied By: self Had a fall or experienced change in No Transfer Assistance: None activities of daily living that may affect Patient Identification Verified: Yes risk of falls: Secondary Verification Process Completed: Yes Signs or symptoms of abuse/neglect since last visito No Patient Requires Transmission-Based Precautions: No Hospitalized since last visit: No Patient Has Alerts: Yes Implantable device outside of the clinic excluding No cellular tissue based products placed in the center since last visit: Pain Present Now: Yes Electronic Signature(s) Signed: 05/27/2021 10:05:09 AM By: Haywood Pao CHT EMT BS , , Entered By: Haywood Pao on 05/23/2021 15:27:02 -------------------------------------------------------------------------------- Clinic Level of Care Assessment Details Patient Name: Date of Service: Tony Hicks 05/23/2021 3:15 PM Medical Record Number: [id number] Patient Account Number: [id number] Date of Birth/Sex: Treating RN: [birth year] (55 y.o. Elizebeth Hicks Primary Care Burris Matherne: Thad Ranger Other Clinician: Referring Coreon Simkins: Treating Lyndon Chenoweth/Extender: Vanita Panda in Treatment: 15 Clinic Level of Care Assessment Items TOOL 4 Quantity Score X- 1 0 Use when only  an EandM is performed on FOLLOW-UP visit ASSESSMENTS - Nursing Assessment / Reassessment X- 1 10 Reassessment of Co-morbidities (includes updates in patient status) X- 1 5 Reassessment of Adherence to Treatment Plan ASSESSMENTS - Wound and Skin A ssessment / Reassessment X - Simple Wound Assessment / Reassessment - one wound 1 5 []  - 0 Complex Wound Assessment / Reassessment - multiple wounds []  - 0 Dermatologic / Skin Assessment (not related to wound area) ASSESSMENTS - Focused Assessment []  - 0 Circumferential Edema Measurements - multi extremities []  - 0 Nutritional Assessment / Counseling / Intervention X- 1 5 Lower Extremity Assessment (monofilament, tuning fork, pulses) []  - 0 Peripheral Arterial Disease Assessment (using hand held doppler) ASSESSMENTS - Ostomy and/or Continence Assessment and Care []  - 0 Incontinence Assessment and Management []  - 0 Ostomy Care Assessment and Management (repouching, etc.) PROCESS - Coordination of Care X - Simple Patient / Family Education for ongoing care 1 15 []  - 0 Complex (extensive) Patient / Family Education for ongoing care X- 1 10 Staff obtains Consents, Records, T Results / Process Orders est []  - 0 Staff telephones HHA, Nursing Homes / Clarify orders / etc []  - 0 Routine Transfer to another Facility (non-emergent condition) []  - 0 Routine Hospital Admission (non-emergent condition) []  - 0 New Admissions / Manufacturing engineer / Ordering NPWT Apligraf, etc. , []  - 0 Emergency Hospital Admission (emergent condition) X- 1 10 Simple Discharge Coordination []  - 0 Complex (extensive) Discharge Coordination PROCESS - Special Needs []  - 0 Pediatric / Minor Patient Management []  - 0 Isolation Patient Management []  - 0 Hearing / Language / Visual special needs []  - 0 Assessment of Community assistance (transportation, D/C planning, etc.) []  - 0 Additional assistance / Altered mentation []  - 0 Support Surface(s)  Assessment (bed, cushion, seat, etc.) INTERVENTIONS - Wound Cleansing / Measurement X - Simple Wound Cleansing - one wound 1 5 []  - 0 Complex Wound Cleansing -  multiple wounds X- 1 5 Wound Imaging (photographs - any number of wounds) []  - 0 Wound Tracing (instead of photographs) X- 1 5 Simple Wound Measurement - one wound []  - 0 Complex Wound Measurement - multiple wounds INTERVENTIONS - Wound Dressings []  - 0 Small Wound Dressing one or multiple wounds []  - 0 Medium Wound Dressing one or multiple wounds []  - 0 Large Wound Dressing one or multiple wounds []  - 0 Application of Medications - topical []  - 0 Application of Medications - injection INTERVENTIONS - Miscellaneous []  - 0 External ear exam []  - 0 Specimen Collection (cultures, biopsies, blood, body fluids, etc.) []  - 0 Specimen(s) / Culture(s) sent or taken to Lab for analysis []  - 0 Patient Transfer (multiple staff / / Similar devices) []  - 0 Simple Staple / Suture removal (25 or less) []  - 0 Complex Staple / Suture removal (26 or more) []  - 0 Hypo / Hyperglycemic Management (close monitor of Blood Glucose) []  - 0 Ankle / Brachial Index (ABI) - do not check if billed separately X- 1 5 Vital Signs Has the patient been seen at the hospital within the last three years: Yes Total Score: 80 Level Of Care: New/Established - Level 3 Electronic Signature(s) Signed: 05/23/2021 5:23:58 PM By: RN, BSN Entered By: on 05/23/2021 17:20:47 -------------------------------------------------------------------------------- Encounter Discharge Information Details Patient Name: Date of Service: Tony Hicks 05/23/2021 3:15 PM Medical Record Number: Patient Account Number: Date of Birth/Sex: Treating RN: October 08, 1965 (55 y.o. Primary Care Matthews Franks: Nurse, adult Other Clinician: Referring Zyana Amaro: Treating Dayzee Trower/Extender: in Treatment: 15 Encounter Discharge Information Items Discharge Condition: Stable Ambulatory Status: Ambulatory Discharge Destination: Home Transportation: Private Auto Accompanied By: alone Schedule Follow-up Appointment: Yes Clinical Summary of Care: Patient Declined Electronic Signature(s) Signed: 05/23/2021 5:23:58 PM By: RN, BSN Entered By: on 05/23/2021 17:21:22 -------------------------------------------------------------------------------- Lower Extremity Assessment Details Patient Name: Date of Service: Tony Hicks 05/23/2021 3:15 PM Medical Record Number: Zandra Abts Patient Account Number: 05/25/2021 Date of Birth/Sex: Treating RN: 1966-04-26 (55 y.o. [id number] Primary Care Tiwanda Threats: 11/19/1965 Other Clinician: Referring Elyjah Hazan: Treating Myleah Cavendish/Extender: 58, Crystal Weeks in Treatment: 15 Edema Assessment Assessed: [Left: No] [Right: No] Edema: [Left: Ye] [Right: s] Calf Left: Right: Point of Measurement: 34 cm From Medial Instep 37.3 cm Ankle Left: Right: Point of Measurement: 11 cm From Medial Instep 28.2 cm Vascular Assessment Pulses: Dorsalis Pedis Palpable: [Left:Yes] Electronic Signature(s) Signed: 05/23/2021 5:23:58 PM By: Thad Ranger RN, BSN Signed: 05/27/2021 10:05:09 AM By: 05/25/2021 CHT EMT BS , , Entered By: Zandra Abts on 05/23/2021 15:35:22 -------------------------------------------------------------------------------- Multi Wound Chart Details Patient Name: Date of Service: 05/25/2021, Tony Hicks 05/23/2021 3:15 PM Medical Record Number: [id number] Patient Account Number: [id number] Date of Birth/Sex: Treating RN: [birth year] (55 y.o. 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] [N/A:N/A] Secondary Etiology: [3:Peripheral Arterial Disease, Type II] [N/A:N/A] Comorbid History: [3:Diabetes, Neuropathy 11/04/2020] [N/A:N/A] Date Acquired: [3:15] [N/A:N/A] Weeks of Treatment: [3:Healed - Epithelialized] [N/A:N/A] Wound Status: [3:0x0x0] [N/A:N/A] Measurements  L x W x D (cm) [3:0] [N/A:N/A] A (cm) : rea [3:0] [N/A:N/A] Volume (cm) : [3:100.00%] [N/A:N/A] % Reduction in A rea: [3:100.00%] [N/A:N/A] % Reduction in Volume: [3:Grade 2] [N/A:N/A] Classification: [3:None Present] [N/A:N/A] Exudate A mount: [3:Distinct, outline attached] [N/A:N/A] Wound Margin: [3:None Present (0%)] [N/A:N/A] Granulation A mount: [3:None Present (0%)] [N/A:N/A] Necrotic A mount: [3:Fascia: No] [N/A:N/A] Exposed Structures: [3:Fat Layer (Subcutaneous Tissue): No Tendon: No Muscle: No Joint: No Bone: No Large (67-100%)] [N/A:N/A] Treatment Notes Electronic Signature(s) Signed: 05/23/2021 4:06:55 PM By: Geralyn Corwin DO Signed: 05/23/2021 5:23:58 PM By: Zandra Abts RN, BSN Entered By: Geralyn Corwin on 05/23/2021 16:04:13 -------------------------------------------------------------------------------- Multi-Disciplinary Care Plan Details Patient Name: Date of Service: Tony Hicks 05/23/2021 3:15 PM Medical Record Number: [id number] Patient Account Number: [id number] Date of Birth/Sex: Treating RN: [birth year] (55 y.o. Elizebeth Hicks Primary Care Prinston Kynard: Thad Ranger Other Clinician: Referring Jaki Steptoe: Treating Alfredo Spong/Extender: Vanita Panda in Treatment:  15 Multidisciplinary Care Plan reviewed with physician Active Inactive Electronic Signature(s) Signed: 05/23/2021 5:23:58 PM By: Zandra Abts RN, BSN Entered By: Zandra Abts on 05/23/2021 17:20:13 -------------------------------------------------------------------------------- Pain Assessment Details Patient Name: Date of Service: Tony Hicks 05/23/2021 3:15 PM Medical Record Number: [id number] Patient Account Number: [id number] Date of Birth/Sex: Treating RN: [birth year] (55 y.o. Elizebeth Hicks Primary Care Juanisha Bautch: Thad Ranger Other Clinician: Referring Mervyn Pflaum: Treating Kian Ottaviano/Extender: Vanita Panda in Treatment: 15 Active Problems Location of Pain Severity and Description of Pain Patient Has Paino Yes Site Locations Pain Location: Pain in Ulcers Duration of the Pain. Constant / Intermittento Constant Rate the pain. Current Pain Level: 6 Worst Pain Level: 10 Least Pain Level: 3 Tolerable Pain Level: 6 Character of Pain Describe the Pain: Aching, Throbbing Pain Management and Medication Current Pain Management: Electronic Signature(s) Signed: 05/23/2021 5:23:58 PM By: Zandra Abts RN, BSN Signed: 05/27/2021 10:05:09 AM By: Haywood Pao CHT EMT BS , , Entered By: Haywood Pao on 05/23/2021 15:29:28 -------------------------------------------------------------------------------- Patient/Caregiver Education Details Patient Name: Date of Service: [name] [name] 11/17/2022andnbsp3:15 PM Medical Record Number: [id number] Patient Account Number: [id number] Date of Birth/Gender: Treating RN: February 22, 1966 (55 y.o. Elizebeth Hicks Primary Care Physician: Thad Ranger Other Clinician: Referring Physician: Treating Physician/Extender: Vanita Panda in Treatment: 15 Education Assessment Education Provided To: Patient Education Topics Provided Wound/Skin Impairment: Methods:  Explain/Verbal Responses: State content correctly [employer]) Signed: 05/23/2021 5:23:58 PM By: Zandra Abts RN, BSN Entered By: Zandra Abts on 05/23/2021 17:20:25 -------------------------------------------------------------------------------- Wound Assessment Details Patient Name: Date of Service: Tony Hicks 05/23/2021 3:15 PM Medical Record Number: [id number] Patient Account Number: [id number] Date of Birth/Sex: Treating RN: Jan 11, 1966 (55 y.o. 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 Wounding Event: Gradually Appeared Wound Status: Healed - Epithelialized Date Acquired: 11/04/2020 Comorbid History: Peripheral Arterial Disease, Type II Diabetes, Neuropathy Weeks Of Treatment: 15 Clustered Wound: No Photos Photo Uploaded By: Haywood Pao on 05/23/2021 21:34:46 Wound Measurements Length: (cm) Width: (cm) Depth: (cm) Area: (cm) Volume: (cm) 0 % Reduction in Area: 100% 0 % Reduction in Volume: 100% 0 Epithelialization: Large (67-100%) 0 Tunneling: No 0 Undermining: No Wound Description Classification: Grade 2 Wound Margin: Distinct, outline attached Exudate Amount: None Present Foul Odor After Cleansing: No Slough/Fibrino No Wound  Bed Granulation Amount: None Present (0%) Exposed Structure Necrotic Amount: None Present (0%) Fascia Exposed: No Fat Layer (Subcutaneous Tissue) Exposed: No Tendon Exposed: No Muscle Exposed: No Joint Exposed: No Bone Exposed: No Electronic Signature(s) Signed: 05/23/2021 5:23:58 PM By: Zandra Abts RN, BSN Entered By: Zandra Abts on 05/23/2021  15:59:28 -------------------------------------------------------------------------------- Vitals Details Patient Name: Date of Service: Tony Hicks 05/23/2021 3:15 PM Medical Record Number: [id number] Patient Account Number: [id number] Date of Birth/Sex: Treating RN: 1965-09-10 (55 y.o. Elizebeth Hicks Primary Care Carel Schnee: Thad Ranger Other Clinician: Referring Kortlyn Koltz: Treating Gwenyth Dingee/Extender: Vanita Panda in Treatment: 15 Vital Signs Time Taken: 15:27 Temperature (F): 98.7 Height (in): 67 Pulse (bpm): 80 Weight (lbs): 190 Respiratory Rate (breaths/min): 18 Body Mass Index (BMI): 29.8 Blood Pressure (mmHg): 119/73 Reference Range: 80 - 120 mg / dl Electronic Signature(s) Signed: 05/27/2021 10:05:09 AM By: Haywood Pao CHT EMT BS , , Entered By: Haywood Pao on 05/23/2021 15:27:31

## 2021-06-13 ENCOUNTER — Ambulatory Visit: Payer: Self-pay | Admitting: Nurse Practitioner

## 2021-06-18 ENCOUNTER — Other Ambulatory Visit: Payer: Self-pay

## 2021-06-19 ENCOUNTER — Ambulatory Visit: Payer: Self-pay | Admitting: Nurse Practitioner

## 2021-06-19 NOTE — Progress Notes (Signed)
Canceled.

## 2021-06-21 ENCOUNTER — Ambulatory Visit: Payer: Self-pay | Admitting: Nurse Practitioner

## 2021-06-22 IMAGING — CR DG FOOT COMPLETE 3+V*L*
3 series · 3 of 3 positions shown · non-contrast
Comparison: None.

CLINICAL DATA: Foot pain

EXAM:
LEFT FOOT - COMPLETE 3+ VIEW

[x foot ap left]
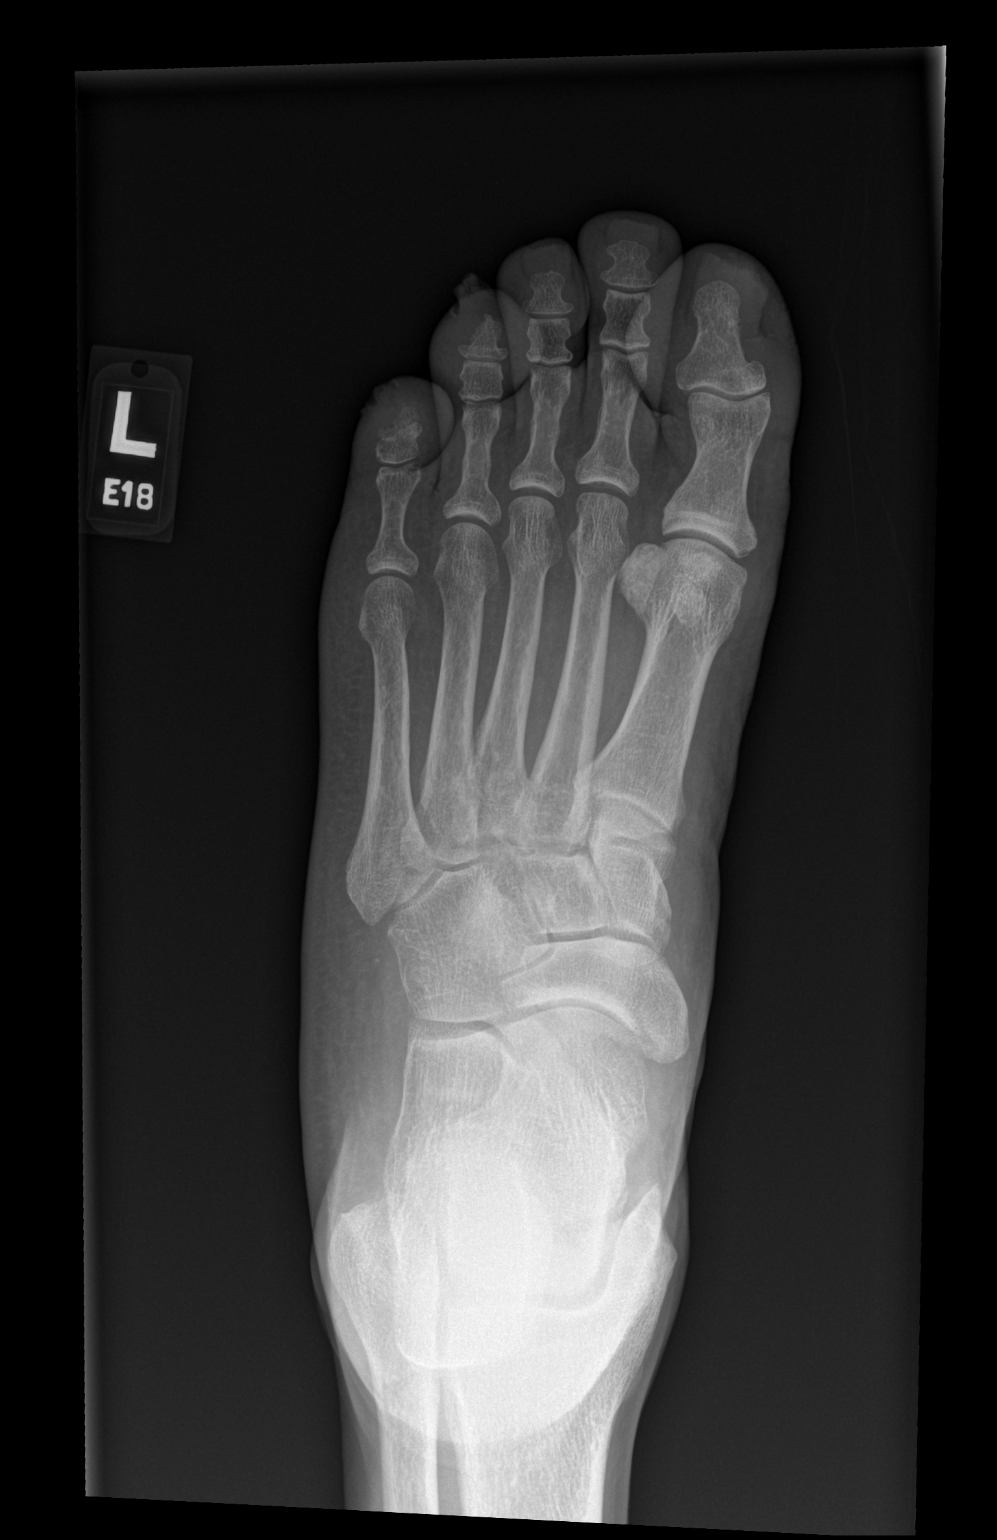

[x foot obl left]
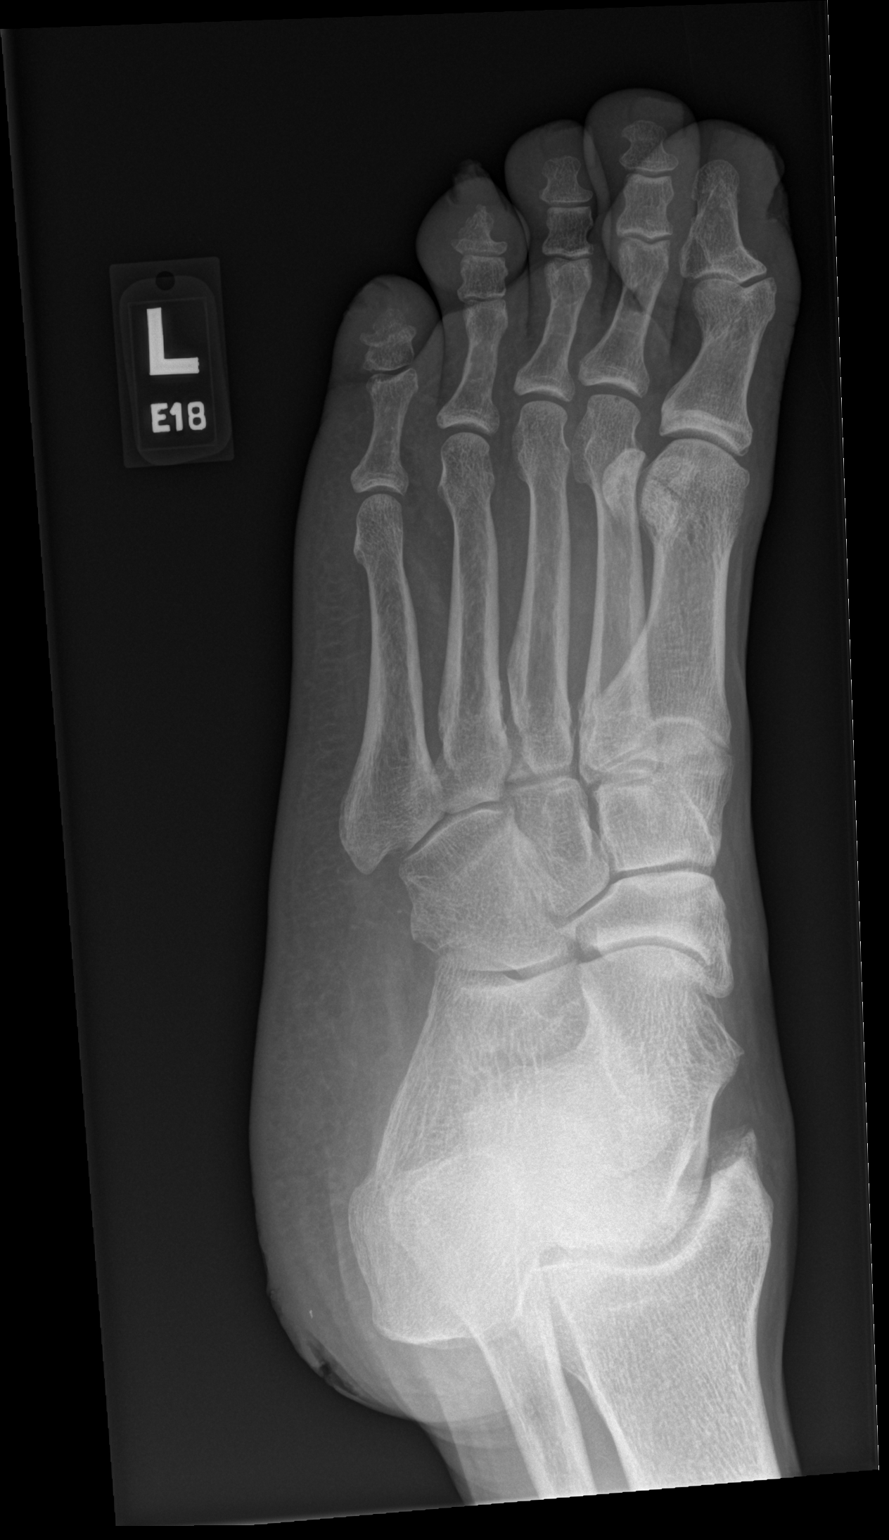

[x foot lat left]
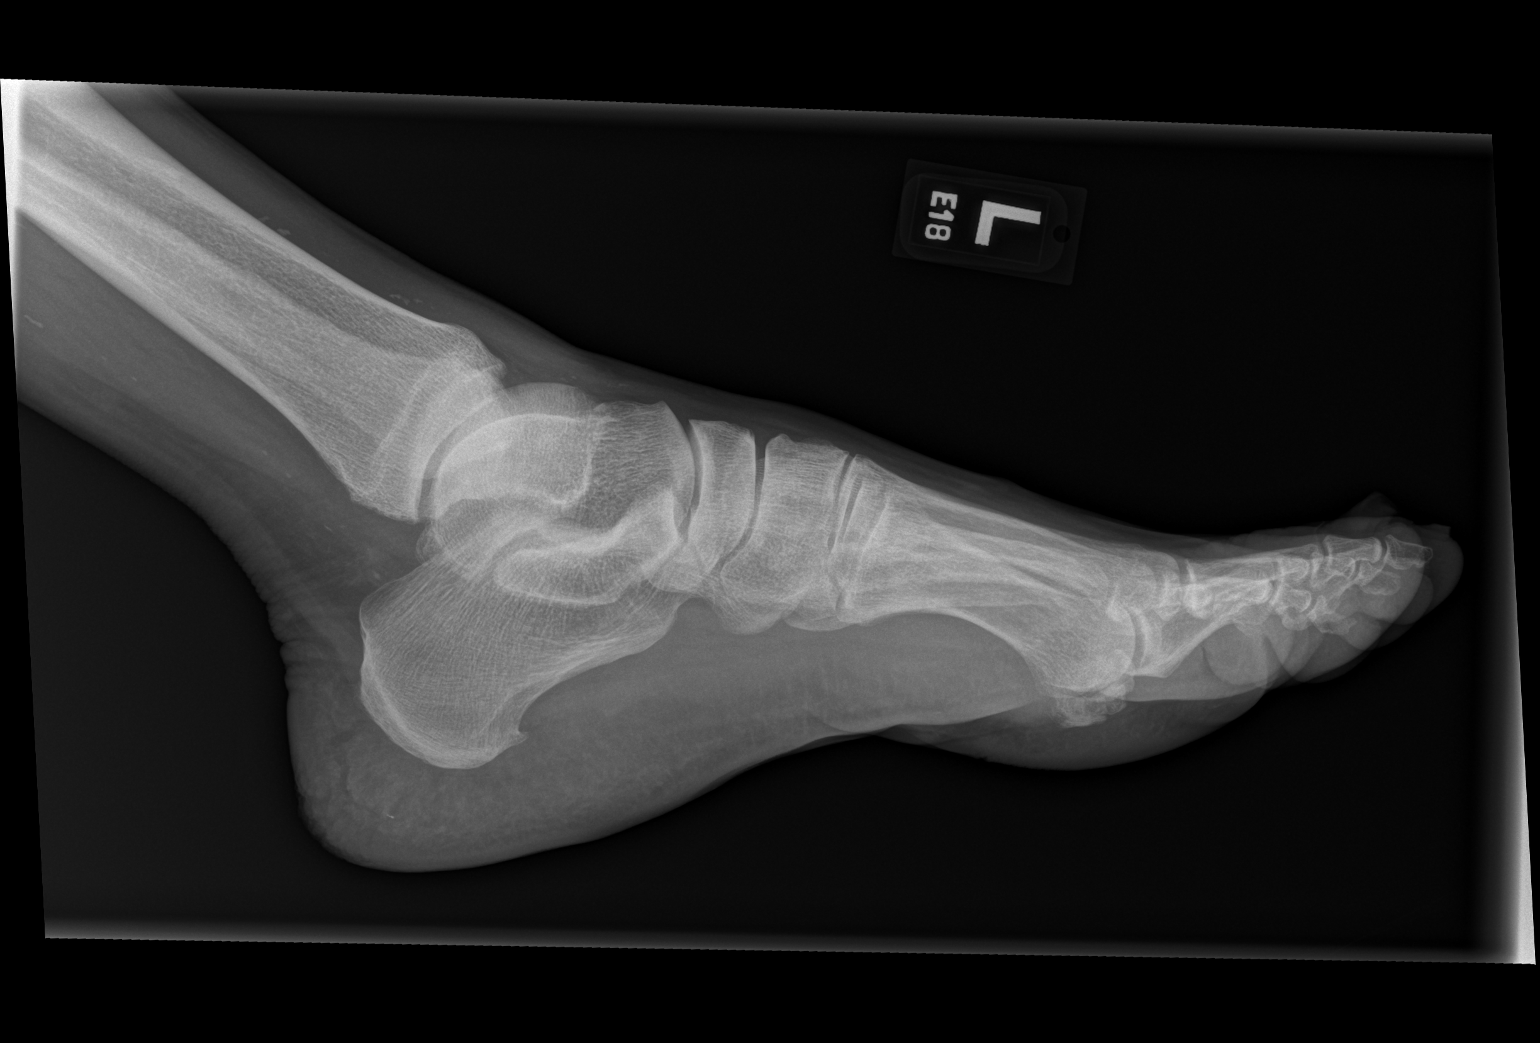

[3 of 3 positions shown; findings below may reference images not displayed]

FINDINGS: There is no evidence of fracture or dislocation. There is no
evidence of arthropathy or other focal bone abnormality. Small
plantar heel spur. Soft tissues are unremarkable.
IMPRESSION: 1. No acute findings.
2. Small plantar heel spur.

## 2021-07-12 ENCOUNTER — Ambulatory Visit (INDEPENDENT_AMBULATORY_CARE_PROVIDER_SITE_OTHER): Payer: 59 | Admitting: Nurse Practitioner

## 2021-07-12 ENCOUNTER — Other Ambulatory Visit: Payer: Self-pay

## 2021-07-12 ENCOUNTER — Encounter: Payer: Self-pay | Admitting: Nurse Practitioner

## 2021-07-12 VITALS — BP 128/85 | HR 94 | Temp 99.0°F | Ht 66.0 in | Wt 205.2 lb

## 2021-07-12 DIAGNOSIS — M79671 Pain in right foot: Secondary | ICD-10-CM

## 2021-07-12 DIAGNOSIS — E118 Type 2 diabetes mellitus with unspecified complications: Secondary | ICD-10-CM

## 2021-07-12 DIAGNOSIS — I739 Peripheral vascular disease, unspecified: Secondary | ICD-10-CM

## 2021-07-12 DIAGNOSIS — E11621 Type 2 diabetes mellitus with foot ulcer: Secondary | ICD-10-CM

## 2021-07-12 DIAGNOSIS — D508 Other iron deficiency anemias: Secondary | ICD-10-CM

## 2021-07-12 DIAGNOSIS — L97429 Non-pressure chronic ulcer of left heel and midfoot with unspecified severity: Secondary | ICD-10-CM

## 2021-07-12 DIAGNOSIS — K219 Gastro-esophageal reflux disease without esophagitis: Secondary | ICD-10-CM

## 2021-07-12 DIAGNOSIS — G621 Alcoholic polyneuropathy: Secondary | ICD-10-CM

## 2021-07-12 DIAGNOSIS — F10188 Alcohol abuse with other alcohol-induced disorder: Secondary | ICD-10-CM

## 2021-07-12 LAB — POCT GLYCOSYLATED HEMOGLOBIN (HGB A1C)
HbA1c POC (<> result, manual entry): 5.8 % (ref 4.0–5.6)
HbA1c, POC (controlled diabetic range): 5.8 % (ref 0.0–7.0)
HbA1c, POC (prediabetic range): 5.8 % (ref 5.7–6.4)
Hemoglobin A1C: 5.8 % — AB (ref 4.0–5.6)

## 2021-07-12 MED ORDER — PANTOPRAZOLE SODIUM 40 MG PO TBEC
40.0000 mg | DELAYED_RELEASE_TABLET | Freq: Every day | ORAL | 1 refills | Status: DC
  Filled 2021-07-12 – 2021-09-01 (×3): qty 30, 30d supply, fill #0
  Filled 2021-09-10 (×2): qty 90, 90d supply, fill #0

## 2021-07-12 MED ORDER — METFORMIN HCL 500 MG PO TABS
500.0000 mg | ORAL_TABLET | Freq: Every day | ORAL | 1 refills | Status: DC
  Filled 2021-07-12 – 2021-08-08 (×2): qty 30, 30d supply, fill #0
  Filled 2021-09-01 – 2021-09-10 (×2): qty 30, 30d supply, fill #1
  Filled 2021-09-10 (×2): qty 30, 30d supply, fill #0
  Filled 2021-11-05: qty 30, 30d supply, fill #1
  Filled 2021-11-05: qty 30, 30d supply, fill #0
  Filled 2021-11-29: qty 30, 30d supply, fill #1
  Filled 2022-01-11 – 2022-01-17 (×2): qty 30, 30d supply, fill #2

## 2021-07-12 MED ORDER — ATORVASTATIN CALCIUM 40 MG PO TABS
40.0000 mg | ORAL_TABLET | Freq: Every day | ORAL | 1 refills | Status: DC
  Filled 2021-07-12: qty 30, 30d supply, fill #0

## 2021-07-12 MED ORDER — GABAPENTIN 300 MG PO CAPS
300.0000 mg | ORAL_CAPSULE | Freq: Two times a day (BID) | ORAL | 1 refills | Status: DC
  Filled 2021-07-12 – 2021-09-10 (×4): qty 60, 30d supply, fill #0
  Filled 2021-11-29 – 2022-01-17 (×4): qty 60, 30d supply, fill #1

## 2021-07-12 MED ORDER — IBUPROFEN 800 MG PO TABS
800.0000 mg | ORAL_TABLET | Freq: Three times a day (TID) | ORAL | 0 refills | Status: AC | PRN
  Filled 2021-07-12: qty 42, 14d supply, fill #0

## 2021-07-12 NOTE — Progress Notes (Signed)
[facility] Case Management Referral Note  07/12/2021 Name: Greysin Medlen MRN: [id number] DOB: 11-21-1965 Costas Sena is a 56 y.o. year old male who sees Barbette Merino, NP for primary care. LCSW was consulted to assess patient's needs and assist the patient with Transportation Needs .  Interpreter: No.   Interpreter Name & Language: none  Assessment: Patient experiencing Transportation barriers.   Intervention: Patient called yesterday for help with transportation to today's appointment. CSW arranged pickup with [facility] transportation services. Patient arrived at appointment and was seen by provider. CSW confirmed patient's phone number for pickup information for ride back home. Patient indicated it's the same number that he used to arrive today, [phone]. CSW scheduled ride home. Got alert that the ride was cancelled/patient no-showed. Called the number and it was patient's son at home. He provided number for phone patient had with him, [phone]. Called this number and it went to voicemail. LVM for patient and advised him to call if he needs help with a ride home.   Review of patient status, including review of consultants reports, relevant laboratory and other test results, and collaboration with appropriate care team members and the patient's provider was performed as part of comprehensive patient evaluation and provision of services.    Abigail Butts, LCSW [facility] [phone]

## 2021-07-12 NOTE — Progress Notes (Signed)
[name], [name] Phone:  [phone]   Fax:  [phone]   Established Patient Office Visit  Subjective:  Patient ID: Tony Hicks, male    DOB: [birth year]  Age: 56 y.o. MRN: [id number]  CC:  Chief Complaint  Patient presents with   Follow-up    Pt is here today for his 3 month follow up visit. No concerns to discuss today.    HPI Tony Hicks presents for follow up. He  has a past medical history of Diabetes mellitus without complication ([place]) and Syphilis.   He states that he did not establish care with Dr Mitchel Honour at [facility]. He was sent away.    He is following up for diabetes.  He is currently prescribed metformin 500 mg.  His A1c June 2022 was 6.2%.  He does not monitor his blood glucose at home.  He is currently on atorvastatin 40 mg daily.  He has a history of a diabetic foot ulcer with surgery.  He continues to have left foot pain on occasion. He will follow up with vascular in March.   He is having right foot 5th toe. He denies any injury. He reports that it is worse at night. He denies any swelling or open sores. He does continue to smoke despite his PAD.   Past Medical History:  Diagnosis Date   Diabetes mellitus without complication ([place])    Syphilis    Treated  02/14/2019    Past Surgical History:  Procedure Laterality Date   ABDOMINAL AORTOGRAM W/LOWER EXTREMITY N/A 01/11/2021   Procedure: ABDOMINAL AORTOGRAM W/LOWER EXTREMITY;  Surgeon: Cherre Robins, MD;  Location: [facility];  Service: Cardiovascular;  Laterality: N/A;   NO PAST SURGERIES     THROMBECTOMY OF BYPASS GRAFT FEMORAL- TIBIAL ARTERY Left 01/14/2021   Procedure: LEFT ABOVE KNEE TO  POSTERIOR TIBIAL BYPASS GRAFT WITH GREATER SAPHENOUS VEIN;  Surgeon: Marty Heck, MD;  Location: [facility];  Service: Vascular;  Laterality: Left;    Family History  Family history unknown: Yes    Social History   Socioeconomic History    Marital status: Single    Spouse name: Not on file   Number of children: Not on file   Years of education: Not on file   Highest education level: Not on file  Occupational History   Occupation: Architect  Tobacco Use   Smoking status: Every Day    Packs/day: 0.50    Types: Cigarettes   Smokeless tobacco: Never  Vaping Use   Vaping Use: Never used  Substance and Sexual Activity   Alcohol use: Yes    Alcohol/week: 24.0 standard drinks    Types: 4 Cans of beer, 20 Standard drinks or equivalent per week    Comment: Approx 20-25 beers week   Drug use: Yes    Frequency: 1.0 times per week    Types: Cocaine, Marijuana    Comment: smoke crack   Sexual activity: Not Currently  Other Topics Concern   Not on file  Social History Narrative   Not on file   Social Determinants of Health   Financial Resource Strain: Not on file  Food Insecurity: Not on file  Transportation Needs: Not on file  Physical Activity: Not on file  Stress: Not on file  Social Connections: Not on file  Intimate Partner Violence: Not on file    Outpatient Medications Prior to Visit  Medication Sig Dispense Refill  aspirin 81 MG EC tablet Take 1 tablet (81 mg total) by mouth daily. Swallow whole. 30 tablet 11   citalopram (CELEXA) 20 MG tablet Take 1 tablet (20 mg total) by mouth daily. 30 tablet 3   nicotine (NICODERM CQ) 21 mg/24hr patch Place 1 patch (21 mg total) onto the skin daily. 28 patch 0   gabapentin (NEURONTIN) 300 MG capsule TAKE 1 CAPSULE BY MOUTH TWICE A DAY 60 capsule 0   ibuprofen (ADVIL) 600 MG tablet Take 1 tablet (600 mg total) by mouth every 8 (eight) hours as needed. 30 tablet 0   metFORMIN (GLUCOPHAGE) 500 MG tablet Take 1 tablet (500 mg total) by mouth daily with breakfast. 90 tablet 0   pantoprazole (PROTONIX) 40 MG tablet Take 1 tablet (40 mg total) by mouth daily. 90 tablet 0   bacitracin ointment Apply 1 application topically 2 (two) times daily. (Patient not taking: Reported  on 05/27/2021) 120 g 0   collagenase (SANTYL) ointment Apply topically daily to left ankle wound, nickel thick, followed by wet to dry dressing. (Patient not taking: Reported on 07/12/2021) 30 g 0   ferrous sulfate 325 (65 FE) MG tablet Take 1 tablet (325 mg total) by mouth daily with breakfast. (Patient not taking: Reported on 05/27/2021) 120 tablet 0   atorvastatin (LIPITOR) 40 MG tablet Take 1 tablet (40 mg total) by mouth daily. 30 tablet 3   No facility-administered medications prior to visit.    No Known Allergies  ROS Review of Systems    Objective:    Physical Exam HENT:     Head: Normocephalic and atraumatic.     Nose: Nose normal.  Cardiovascular:     Rate and Rhythm: Normal rate and regular rhythm.     Pulses: Normal pulses.     Heart sounds: Normal heart sounds.  Pulmonary:     Effort: Pulmonary effort is normal.     Breath sounds: Normal breath sounds.  Abdominal:     Palpations: Abdomen is soft.  Musculoskeletal:        General: Normal range of motion.     Cervical back: Normal range of motion.     Left lower leg: No edema.     Comments: Foot boot on  Skin:    General: Skin is warm and dry.     Capillary Refill: Capillary refill takes less than 2 seconds.  Neurological:     General: No focal deficit present.     Mental Status: He is alert and oriented to person, place, and time.  Psychiatric:        Behavior: Behavior normal.    BP 128/85    Pulse 94    Temp 99 F (37.2 C)    Ht _0  (1.676 m)    Wt 205 lb 3.2 oz (93.1 kg)    SpO2 98%    BMI 33.12 kg/m  Wt Readings from Last 3 Encounters:  07/12/21 205 lb 3.2 oz (93.1 kg)  05/27/21 199 lb (90.3 kg)  03/14/21 190 lb 0.8 oz (86.2 kg)     Health Maintenance Due  Topic Date Due   FOOT EXAM  Never done   URINE MICROALBUMIN  Never done    There are no preventive care reminders to display for this patient.  Lab Results  Component Value Date   TSH 0.294 (L) 02/03/2019   Lab Results  Component  Value Date   WBC 4.7 03/14/2021   HGB 12.7 (L) 03/14/2021   HCT 37.0 (  L) 03/14/2021   MCV 78 (L) 03/14/2021   PLT 235 03/14/2021   Lab Results  Component Value Date   NA 139 01/28/2021   K 4.2 01/28/2021   CO2 23 01/28/2021   GLUCOSE 102 (H) 01/28/2021   BUN 8 01/28/2021   CREATININE 0.61 (L) 01/28/2021   BILITOT <0.2 01/28/2021   ALKPHOS 69 01/28/2021   AST 20 01/28/2021   ALT 29 01/28/2021   PROT 6.9 01/28/2021   ALBUMIN 4.1 01/28/2021   CALCIUM 9.2 01/28/2021   ANIONGAP 9 01/22/2021   EGFR 114 01/28/2021   Lab Results  Component Value Date   CHOL 114 01/15/2021   Lab Results  Component Value Date   HDL 45 01/15/2021   Lab Results  Component Value Date   LDLCALC 62 01/15/2021   Lab Results  Component Value Date   TRIG 35 01/15/2021   Lab Results  Component Value Date   CHOLHDL 2.5 01/15/2021   Lab Results  Component Value Date   HGBA1C 5.8 (A) 07/12/2021   HGBA1C 5.8 07/12/2021   HGBA1C 5.8 07/12/2021   HGBA1C 5.8 07/12/2021      Assessment & Plan:   Problem List Items Addressed This Visit       Cardiovascular and Mediastinum   PAD (peripheral artery disease) ([place]) Stable Continue to follow up vascular   Relevant Medications   atorvastatin (LIPITOR) 40 MG tablet     Endocrine   Diabetic foot ulcer (HCC) Resolved   Relevant Medications   metFORMIN (GLUCOPHAGE) 500 MG tablet   atorvastatin (LIPITOR) 40 MG tablet   Diabetes mellitus type 2, controlled ([place]) - Primary Controlled Encourage compliance with current treatment regimen   Encourage regular CBG monitoring Encourage contacting office if excessive hyperglycemia and or hypoglycemia Lifestyle modification with healthy diet (fewer calories, more high fiber foods, whole grains and non-starchy vegetables, lower fat meat and fish, low-fat diary include healthy oils) regular exercise (physical activity) and weight loss Opthalmology exam discussed  R Regular dental visits encouraged Home  BP monitoring also encouraged goal <130/80    Relevant Medications   metFORMIN (GLUCOPHAGE) 500 MG tablet   atorvastatin (LIPITOR) 40 MG tablet   Other Relevant Orders   HgB A1c (Completed)   Microalbumin, urine   Comp. Metabolic Panel (12)   Ambulatory referral to Podiatry     Nervous and Auditory   Neuropathy, alcoholic (HCC) Persistent  Continue with current regimen.  No changes warranted. Good patient compliance.    Relevant Medications   gabapentin (NEURONTIN) 300 MG capsule     Other   Alcohol abuse with other alcohol-induced disorder ([place])   Other Visit Diagnoses     Gastroesophageal reflux disease, unspecified whether esophagitis present       Relevant Medications   pantoprazole (PROTONIX) 40 MG tablet   Other iron deficiency anemia     Evaluation pending   Relevant Orders   CBC with Differential/Platelet   Iron, TIBC and Ferritin Panel   Right foot pain           Meds ordered this encounter  Medications   gabapentin (NEURONTIN) 300 MG capsule    Sig: Take 1 capsule (300 mg total) by mouth 2 (two) times daily.    Dispense:  180 capsule    Refill:  1    Order Specific Question:   Supervising Provider    Answer:   Tresa Garter [id number]   metFORMIN (GLUCOPHAGE) 500 MG tablet    Sig: Take 1 tablet (500  mg total) by mouth daily with breakfast.    Dispense:  90 tablet    Refill:  1    Order Specific Question:   Supervising Provider    Answer:   Tresa Garter [[id number]]   pantoprazole (PROTONIX) 40 MG tablet    Sig: Take 1 tablet (40 mg total) by mouth daily.    Dispense:  90 tablet    Refill:  1    Order Specific Question:   Supervising Provider    Answer:   Tresa Garter [[id number]]   atorvastatin (LIPITOR) 40 MG tablet    Sig: Take 1 tablet (40 mg total) by mouth daily.    Dispense:  90 tablet    Refill:  1    Order Specific Question:   Supervising Provider    Answer:   Tresa Garter [[id number]]   ibuprofen (ADVIL) 800 MG  tablet    Sig: Take 1 tablet (800 mg total) by mouth every 8 (eight) hours as needed for up to 14 days for moderate pain. Take on full stomach.    Dispense:  42 tablet    Refill:  0    Order Specific Question:   Supervising Provider    Answer:   Tresa Garter [id number]    Follow-up: Return in about 3 months (around 10/10/2021) for follow up [id number].    Vevelyn Francois, NP

## 2021-07-12 NOTE — Patient Instructions (Signed)
Steps to Quit Smoking Smoking tobacco is the leading cause of preventable death. It can affect almost every organ in the body. Smoking puts you and people around you at risk for many serious, long-lasting (chronic) diseases. Quitting smoking can be hard, but it is one of the best things that you can do for your health. It is never too late to quit. How do I get ready to quit? When you decide to quit smoking, make a plan to help you succeed. Before you quit: Pick a date to quit. Set a date within the next 2 weeks to give you time to prepare. Write down the reasons why you are quitting. Keep this list in places where you will see it often. Tell your family, friends, and co-workers that you are quitting. Their support is important. Talk with your doctor about the choices that may help you quit. Find out if your health insurance will pay for these treatments. Know the people, places, things, and activities that make you want to smoke (triggers). Avoid them. What first steps can I take to quit smoking? Throw away all cigarettes at home, at work, and in your car. Throw away the things that you use when you smoke, such as ashtrays and lighters. Clean your car. Make sure to empty the ashtray. Clean your home, including curtains and carpets. What can I do to help me quit smoking? Talk with your doctor about taking medicines and seeing a counselor at the same time. You are more likely to succeed when you do both. If you are pregnant or breastfeeding, talk with your doctor about counseling or other ways to quit smoking. Do not take medicine to help you quit smoking unless your doctor tells you to do so. To quit smoking: Quit right away Quit smoking totally, instead of slowly cutting back on how much you smoke over a period of time. Go to counseling. You are more likely to quit if you go to counseling sessions regularly. Take medicine You may take medicines to help you quit. Some medicines need a  prescription, and some you can buy over-the-counter. Some medicines may contain a drug called nicotine to replace the nicotine in cigarettes. Medicines may: Help you to stop having the desire to smoke (cravings). Help to stop the problems that come when you stop smoking (withdrawal symptoms). Your doctor may ask you to use: Nicotine patches, gum, or lozenges. Nicotine inhalers or sprays. Non-nicotine medicine that is taken by mouth. Find resources Find resources and other ways to help you quit smoking and remain smoke-free after you quit. These resources are most helpful when you use them often. They include: Online chats with a counselor. Phone quitlines. Printed self-help materials. Support groups or group counseling. Text messaging programs. Mobile phone apps. Use apps on your mobile phone or tablet that can help you stick to your quit plan. There are many free apps for mobile phones and tablets as well as websites. Examples include Quit Guide from the CDC and smokefree.gov  What things can I do to make it easier to quit?  Talk to your family and friends. Ask them to support and encourage you. Call a phone quitline (1-800-QUIT-NOW), reach out to support groups, or work with a counselor. Ask people who smoke to not smoke around you. Avoid places that make you want to smoke, such as: Bars. Parties. Smoke-break areas at work. Spend time with people who do not smoke. Lower the stress in your life. Stress can make you want to   smoke. Try these things to help your stress: Getting regular exercise. Doing deep-breathing exercises. Doing yoga. Meditating. Doing a body scan. To do this, close your eyes, focus on one area of your body at a time from head to toe. Notice which parts of your body are tense. Try to relax the muscles in those areas. How will I feel when I quit smoking? Day 1 to 3 weeks Within the first 24 hours, you may start to have some problems that come from quitting tobacco.  These problems are very bad 2-3 days after you quit, but they do not often last for more than 2-3 weeks. You may get these symptoms: Mood swings. Feeling restless, nervous, angry, or annoyed. Trouble concentrating. Dizziness. Strong desire for high-sugar foods and nicotine. Weight gain. Trouble pooping (constipation). Feeling like you may vomit (nausea). Coughing or a sore throat. Changes in how the medicines that you take for other issues work in your body. Depression. Trouble sleeping (insomnia). Week 3 and afterward After the first 2-3 weeks of quitting, you may start to notice more positive results, such as: Better sense of smell and taste. Less coughing and sore throat. Slower heart rate. Lower blood pressure. Clearer skin. Better breathing. Fewer sick days. Quitting smoking can be hard. Do not give up if you fail the first time. Some people need to try a few times before they succeed. Do your best to stick to your quit plan, and talk with your doctor if you have any questions or concerns. Summary Smoking tobacco is the leading cause of preventable death. Quitting smoking can be hard, but it is one of the best things that you can do for your health. When you decide to quit smoking, make a plan to help you succeed. Quit smoking right away, not slowly over a period of time. When you start quitting, seek help from your doctor, family, or friends. This information is not intended to replace advice given to you by your health care provider. Make sure you discuss any questions you have with your health care provider. Document Revised: 03/01/2021 Document Reviewed: 09/11/2018 Elsevier Patient Education  2022 [employer] Inc.  

## 2021-07-14 LAB — MICROALBUMIN, URINE: Microalbumin, Urine: 21.4 ug/mL

## 2021-07-25 ENCOUNTER — Ambulatory Visit: Payer: PRIVATE HEALTH INSURANCE | Admitting: Podiatrist

## 2021-07-25 ENCOUNTER — Other Ambulatory Visit: Payer: Self-pay

## 2021-08-01 ENCOUNTER — Other Ambulatory Visit: Payer: Self-pay

## 2021-08-08 ENCOUNTER — Other Ambulatory Visit: Payer: Self-pay

## 2021-08-08 ENCOUNTER — Encounter: Payer: Self-pay | Admitting: Podiatry

## 2021-08-08 ENCOUNTER — Ambulatory Visit (INDEPENDENT_AMBULATORY_CARE_PROVIDER_SITE_OTHER): Payer: 59 | Admitting: Podiatry

## 2021-08-08 DIAGNOSIS — M79674 Pain in right toe(s): Secondary | ICD-10-CM

## 2021-08-08 DIAGNOSIS — M79675 Pain in left toe(s): Secondary | ICD-10-CM

## 2021-08-08 DIAGNOSIS — B351 Tinea unguium: Secondary | ICD-10-CM | POA: Diagnosis not present

## 2021-08-09 ENCOUNTER — Telehealth: Payer: Self-pay

## 2021-08-09 NOTE — Telephone Encounter (Signed)
Gabapentin   [facility]   Change his pharmacy to this one NOT [facility]

## 2021-08-09 NOTE — Telephone Encounter (Signed)
Pt medication refills were sent to the pharmacy on 07/12/21 with a 90 day supply with 1 refill.

## 2021-08-11 NOTE — Progress Notes (Signed)
Subjective:   Patient ID: Tony Hicks, male   DOB: 56 y.o.   MRN: [id number]   HPI Patient presents with severe nail disease of several nails on both feet with all nails being thickened incurvated and painful and he cannot cut neuro   ROS      Objective:  Physical Exam  Vascular status was intact with thick yellow brittle nailbeds 1-5 both feet with severe damage to the several nails on both feet     Assessment:  Chronic mycotic nail infection with pain with several nails which are becoming increasingly difficult to deal with conservative     Plan:  H&P reviewed condition and I have recommended at 1 point in future nail removal but today aggressive debridement of all nails accomplished to reduce the stress pain and condition and he will be seen back and ultimately will require a permanent procedure no iatrogenic bleeding was noted

## 2021-09-02 ENCOUNTER — Other Ambulatory Visit: Payer: Self-pay

## 2021-09-09 ENCOUNTER — Other Ambulatory Visit: Payer: Self-pay

## 2021-09-10 ENCOUNTER — Other Ambulatory Visit (HOSPITAL_COMMUNITY): Payer: Self-pay

## 2021-09-10 ENCOUNTER — Other Ambulatory Visit: Payer: Self-pay

## 2021-09-11 ENCOUNTER — Other Ambulatory Visit (HOSPITAL_COMMUNITY): Payer: Self-pay

## 2021-09-11 ENCOUNTER — Other Ambulatory Visit: Payer: Self-pay

## 2021-09-13 ENCOUNTER — Other Ambulatory Visit: Payer: Self-pay

## 2021-10-04 ENCOUNTER — Other Ambulatory Visit: Payer: Self-pay | Admitting: *Deleted

## 2021-10-04 DIAGNOSIS — I739 Peripheral vascular disease, unspecified: Secondary | ICD-10-CM

## 2021-10-04 NOTE — Progress Notes (Signed)
HISTORY AND PHYSICAL     CC:  follow up. Requesting Provider:  Barbette MerinoKing, Crystal M, NP  HPI: This is a 56 y.o. male who is here today for follow up for PAD.  He has hx of Harvest of left leg great saphenous vein and Left above-knee popliteal artery to posterior tibial artery bypass at the ankle with reversed ipsilateral great saphenous vein By Dr. Chestine Sporelark on 01/14/21. He tolerated the surgery well and did well post operatively. He was discharge POD# 4. He presents for follow-up with non-invasive studies.  Pt was last seen 03/08/2021 and at that time, he was still having some pain in the area of the non healing wound on the left leg/foot.  He was following with wound care for management of these wounds.  He bypass graft was patent without evidence of stenosis.  He was still smoking.    The pt returns today for follow up.  He states he still has some numbness of his lower leg that was present before the bypass.  This has not gotten any worse. He does not have non healing wounds or rest pain.  He continues to smoke.  He is smoking about 1/2 ppd.   The pt is on a statin for cholesterol management.    The pt is on an aspirin.    Other AC:  none The pt is not on medication for hypertension.  The pt does have diabetes. Tobacco hx:  current    Past Medical History:  Diagnosis Date   Diabetes mellitus without complication (HCC)    Syphilis    Treated  02/14/2019    Past Surgical History:  Procedure Laterality Date   ABDOMINAL AORTOGRAM W/LOWER EXTREMITY N/A 01/11/2021   Procedure: ABDOMINAL AORTOGRAM W/LOWER EXTREMITY;  Surgeon: Leonie DouglasHawken, Thomas N, MD;  Location: [facility];  Service: Cardiovascular;  Laterality: N/A;   NO PAST SURGERIES     THROMBECTOMY OF BYPASS GRAFT FEMORAL- TIBIAL ARTERY Left 01/14/2021   Procedure: LEFT ABOVE KNEE TO  POSTERIOR TIBIAL BYPASS GRAFT WITH GREATER SAPHENOUS VEIN;  Surgeon: Cephus Shellinglark, Christopher J, MD;  Location: [facility] OR;  Service: Vascular;  Laterality: Left;     No Known Allergies  Current Outpatient Medications  Medication Sig Dispense Refill   aspirin 81 MG EC tablet Take 1 tablet (81 mg total) by mouth daily. Swallow whole. 30 tablet 11   atorvastatin (LIPITOR) 40 MG tablet Take 1 tablet (40 mg total) by mouth daily. 90 tablet 1   bacitracin ointment Apply 1 application topically 2 (two) times daily. 120 g 0   citalopram (CELEXA) 20 MG tablet Take 1 tablet (20 mg total) by mouth daily. 30 tablet 3   collagenase (SANTYL) ointment Apply topically daily to left ankle wound, nickel thick, followed by wet to dry dressing. 30 g 0   ferrous sulfate 325 (65 FE) MG tablet Take 1 tablet (325 mg total) by mouth daily with breakfast. 120 tablet 0   gabapentin (NEURONTIN) 300 MG capsule Take 1 capsule (300 mg total) by mouth 2 (two) times daily. 180 capsule 1   metFORMIN (GLUCOPHAGE) 500 MG tablet Take 1 tablet (500 mg total) by mouth daily with breakfast. 90 tablet 1   nicotine (NICODERM CQ) 21 mg/24hr patch Place 1 patch (21 mg total) onto the skin daily. 28 patch 0   pantoprazole (PROTONIX) 40 MG tablet Take 1 tablet (40 mg total) by mouth daily. 90 tablet 1   No current facility-administered medications for this visit.  Family History  Family history unknown: Yes    Social History   Socioeconomic History   Marital status: Single    Spouse name: Not on file   Number of children: Not on file   Years of education: Not on file   Highest education level: Not on file  Occupational History   Occupation: Holiday representative  Tobacco Use   Smoking status: Every Day    Packs/day: 0.50    Types: Cigarettes   Smokeless tobacco: Never  Vaping Use   Vaping Use: Never used  Substance and Sexual Activity   Alcohol use: Yes    Alcohol/week: 24.0 standard drinks    Types: 4 Cans of beer, 20 Standard drinks or equivalent per week    Comment: Approx 20-25 beers week   Drug use: Yes    Frequency: 1.0 times per week    Types: Cocaine, Marijuana     Comment: smoke crack   Sexual activity: Not Currently  Other Topics Concern   Not on file  Social History Narrative   Not on file   Social Determinants of Health   Financial Resource Strain: Not on file  Food Insecurity: Not on file  Transportation Needs: Not on file  Physical Activity: Not on file  Stress: Not on file  Social Connections: Not on file  Intimate Partner Violence: Not on file     REVIEW OF SYSTEMS:   [X]  denotes positive finding, [ ]  denotes negative finding Cardiac  Comments:  Chest pain or chest pressure:    Shortness of breath upon exertion:    Short of breath when lying flat:    Irregular heart rhythm:        Vascular    Pain in calf, thigh, or hip brought on by ambulation:    Pain in feet at night that wakes you up from your sleep:     Blood clot in your veins:    Leg swelling:         Pulmonary    Oxygen at home:    Productive cough:     Wheezing:         Neurologic    Sudden weakness in arms or legs:     Sudden numbness in arms or legs:     Sudden onset of difficulty speaking or slurred speech:    Temporary loss of vision in one eye:     Problems with dizziness:         Gastrointestinal    Blood in stool:     Vomited blood:         Genitourinary    Burning when urinating:     Blood in urine:        Psychiatric    Major depression:         Hematologic    Bleeding problems:    Problems with blood clotting too easily:        Skin    Rashes or ulcers:        Constitutional    Fever or chills:      PHYSICAL EXAMINATION:  Today's Vitals   10/08/21 1442  BP: (!) 143/85  Pulse: 72  Resp: 18  Temp: 97.6 F (36.4 C)  TempSrc: Temporal  SpO2: 98%  Weight: 202 lb 14.4 oz (92 kg)  Height: 5' 7.5" (1.715 m)  PainSc: 6    Body mass index is 31.31 kg/m.   General:  WDWN in NAD; vital signs documented above Gait: Not observed HENT: WNL,  normocephalic Pulmonary: normal non-labored breathing , without wheezing Cardiac:  regular HR, without carotid bruits Abdomen: soft, NT, no masses; aortic pulse is not palpable Skin: without rashes Vascular Exam/Pulses:  Right Left  Radial 2+ (normal) 2+ (normal)  Popliteal Unable to palpate Unable to palpate  DP Brisk biphasic Brisk monophasic  PT Brisk biphasic Brisk monophasic   Extremities: without ischemic changes, without Gangrene , without cellulitis; without open wounds;  Musculoskeletal: no muscle wasting or atrophy  Neurologic: A&O X 3 Psychiatric:  The pt has Normal affect.   Non-Invasive Vascular Imaging:   ABI's/TBI's on 10/08/2021: Right:  0.92/0.21 - Great toe pressure: 33 Left:  1.07/0.53 - Great toe pressure: 84  Arterial duplex on 10/08/2021: Left Graft #1: AK pop to distal PTA  +--------------------+--------+-------------+----------+-------------+                      PSV cm/sStenosis     Waveform  Comments       +--------------------+--------+-------------+----------+-------------+  Inflow              56                   monophasic               +--------------------+--------+-------------+----------+-------------+  Proximal Anastomosis114                  monophasic               +--------------------+--------+-------------+----------+-------------+  Proximal Graft      418     >70% stenosismonophasictandem lesion  +--------------------+--------+-------------+----------+-------------+  Mid Graft           147                  monophasic               +--------------------+--------+-------------+----------+-------------+  Distal Graft        108                  monophasic               +--------------------+--------+-------------+----------+-------------+  Distal Anastomosis  121                  monophasic               +--------------------+--------+-------------+----------+-------------+  Outflow             116                  monophasic                +--------------------+--------+-------------+----------+-------------+   Summary:  Left: Patent stent with a tandem >70% stenosis just past the anastomosis.  Previous ABI's/TBI's on 03/08/2021: Right:  1.15/0.17 - Great toe pressure: 20 Left:  1.15/0.20 - Great toe pressure:  24  Previous arterial duplex on 03/08/2021: Left: Patent left above knee to posterior tibial artery graft with no  evidence of stenosis.   ASSESSMENT/PLAN:: 56 y.o. male here for follow up for hx of  Harvest of left leg great saphenous vein and Left above-knee popliteal artery to posterior tibial artery bypass at the ankle with reversed ipsilateral great saphenous vein By Dr. Chestine Spore on 01/14/21. He tolerated the surgery well and did well post operatively. He was discharge POD# 4. He presents for follow-up with non-invasive studies.  PAD -pt doing well but continues to have some numbness in the LLE below the knee, most likely related to  lack of blood flow prior to bypass and may or may not improve. -pt toe pressure on the left improved, however, he has an elevated velocity of 418cm/s at the anastomosis of the bypass above the knee.  Will set up for arteriogram to evaluate this as there is a tandem lesion present.  Pt is in agreement to proceed. -he is compliant with his asa/statin  Current smoker -discussed importance of smoking cessation.  Given pt is diabetic, he is at higher risk of limb loss especially if he continues to smoke. He expressed understanding.  He has cut back to 1/2 ppd.  Encouraged him to continue to cut back.    Doreatha Massed, [facility] [phone]  Clinic MD:   Chestine Spore

## 2021-10-08 ENCOUNTER — Ambulatory Visit (INDEPENDENT_AMBULATORY_CARE_PROVIDER_SITE_OTHER): Payer: Medicaid Other | Admitting: Physician Assistant

## 2021-10-08 ENCOUNTER — Encounter: Payer: Self-pay | Admitting: Physician Assistant

## 2021-10-08 ENCOUNTER — Ambulatory Visit (HOSPITAL_COMMUNITY)
Admission: RE | Admit: 2021-10-08 | Discharge: 2021-10-08 | Disposition: A | Discharge status: Home / Self Care | Payer: Medicaid Other | Source: Ambulatory Visit | Attending: Vascular Surgery | Admitting: Vascular Surgery

## 2021-10-08 ENCOUNTER — Ambulatory Visit (INDEPENDENT_AMBULATORY_CARE_PROVIDER_SITE_OTHER)
Admission: RE | Admit: 2021-10-08 | Discharge: 2021-10-08 | Disposition: A | Discharge status: Home / Self Care | Payer: Medicaid Other | Source: Ambulatory Visit | Attending: Vascular Surgery | Admitting: Vascular Surgery

## 2021-10-08 VITALS — BP 143/85 | HR 72 | Temp 97.6°F | Resp 18 | Ht 67.5 in | Wt 202.9 lb

## 2021-10-08 DIAGNOSIS — I739 Peripheral vascular disease, unspecified: Secondary | ICD-10-CM

## 2021-10-10 ENCOUNTER — Other Ambulatory Visit: Payer: Self-pay

## 2021-10-16 ENCOUNTER — Telehealth: Payer: Self-pay | Admitting: Nurse Practitioner

## 2021-10-18 ENCOUNTER — Encounter: Payer: Self-pay | Admitting: Nurse Practitioner

## 2021-10-18 ENCOUNTER — Ambulatory Visit: Payer: Self-pay | Admitting: Nurse Practitioner

## 2021-10-18 ENCOUNTER — Ambulatory Visit (INDEPENDENT_AMBULATORY_CARE_PROVIDER_SITE_OTHER): Payer: Medicaid Other | Admitting: Nurse Practitioner

## 2021-10-18 VITALS — BP 113/87 | HR 82 | Temp 98.8°F | Ht 66.0 in | Wt 207.6 lb

## 2021-10-18 DIAGNOSIS — E118 Type 2 diabetes mellitus with unspecified complications: Secondary | ICD-10-CM

## 2021-10-18 DIAGNOSIS — I739 Peripheral vascular disease, unspecified: Secondary | ICD-10-CM

## 2021-10-18 LAB — POCT GLYCOSYLATED HEMOGLOBIN (HGB A1C)
HbA1c POC (<> result, manual entry): 5.4 % (ref 4.0–5.6)
HbA1c, POC (controlled diabetic range): 5.4 % (ref 0.0–7.0)
HbA1c, POC (prediabetic range): 5.4 % — AB (ref 5.7–6.4)
Hemoglobin A1C: 5.4 % (ref 4.0–5.6)

## 2021-10-18 NOTE — Progress Notes (Signed)
[place], [place]  [address] Phone:  [phone]   Fax:  [phone] Subjective:   Patient ID: Tony Hicks, male    DOB: October 17, 1965, 56 y.o.   MRN: [id number]  Chief Complaint  Patient presents with   Follow-up    Patient is here today for his 3 month follow up visit and will be having next week for his left leg.   HPI Tony Hicks 56 y.o. male  has a past medical history of Diabetes mellitus without complication ([place]) and Syphilis. To the [facility] for reevaluation of chronic illness.   Diabetes Mellitus: Patient presents for follow up of diabetes. Symptoms: foot ulcerations, hyperglycemia, nausea, and paresthesia of the feet.  Patient denies none.  Evaluation to date has been included: hemoglobin A1C.  Home sugars: patient does not check sugars. Treatment to date: no recent interventions.   States that he is having vein surgery on left leg in an attempt to relieve chronic pain. Currently compliant with all medications. Adheres to a diet regimen and walks frequently throughout the week.   Currently unemployed with pending disability benefits. Previously worked in [employer]. Single and resides with his son, was living alone prior to complications with LLE.   Denies any other concerns today. Denies any fatigue, chest pain, shortness of breath, HA or dizziness. Denies any blurred vision, numbness or tingling.   Past Medical History:  Diagnosis Date   Diabetes mellitus without complication ([place])    Syphilis    Treated  02/14/2019    Past Surgical History:  Procedure Laterality Date   ABDOMINAL AORTOGRAM W/LOWER EXTREMITY N/A 01/11/2021   Procedure: ABDOMINAL AORTOGRAM W/LOWER EXTREMITY;  Surgeon: Cherre Robins, MD;  Location: [facility];  Service: Cardiovascular;  Laterality: N/A;   NO PAST SURGERIES     THROMBECTOMY OF BYPASS GRAFT FEMORAL- TIBIAL ARTERY Left 01/14/2021   Procedure: LEFT ABOVE KNEE TO  POSTERIOR TIBIAL  BYPASS GRAFT WITH GREATER SAPHENOUS VEIN;  Surgeon: Marty Heck, MD;  Location: [facility] OR;  Service: Vascular;  Laterality: Left;    Family History  Family history unknown: Yes    Social History   Socioeconomic History   Marital status: Single    Spouse name: Not on file   Number of children: Not on file   Years of education: Not on file   Highest education level: Not on file  Occupational History   Occupation: Architect  Tobacco Use   Smoking status: Every Day    Packs/day: 0.25    Types: Cigarettes   Smokeless tobacco: Never  Vaping Use   Vaping Use: Never used  Substance and Sexual Activity   Alcohol use: Yes    Alcohol/week: 24.0 standard drinks    Types: 4 Cans of beer, 20 Standard drinks or equivalent per week    Comment: Approx 20-25 beers week   Drug use: Not Currently    Frequency: 1.0 times per week    Types: Cocaine, Marijuana    Comment: smoke crack   Sexual activity: Not Currently  Other Topics Concern   Not on file  Social History Narrative   Not on file   Social Determinants of Health   Financial Resource Strain: Not on file  Food Insecurity: Not on file  Transportation Needs: No Transportation Needs   Lack of Transportation (Medical): No   Lack of Transportation (Non-Medical): No  Physical Activity: Not on file  Stress: Not on file  Social Connections: Not  on file  Intimate Partner Violence: Not on file    Outpatient Medications Prior to Visit  Medication Sig Dispense Refill   aspirin 81 MG EC tablet Take 1 tablet (81 mg total) by mouth daily. Swallow whole. 30 tablet 11   atorvastatin (LIPITOR) 40 MG tablet Take 1 tablet (40 mg total) by mouth daily. 90 tablet 1   gabapentin (NEURONTIN) 300 MG capsule Take 1 capsule (300 mg total) by mouth 2 (two) times daily. 180 capsule 1   metFORMIN (GLUCOPHAGE) 500 MG tablet Take 1 tablet (500 mg total) by mouth daily with breakfast. 90 tablet 1   nicotine (NICODERM CQ) 21 mg/24hr patch Place 1  patch (21 mg total) onto the skin daily. 28 patch 0   bacitracin ointment Apply 1 application topically 2 (two) times daily. (Patient not taking: Reported on 10/08/2021) 120 g 0   citalopram (CELEXA) 20 MG tablet Take 1 tablet (20 mg total) by mouth daily. (Patient not taking: Reported on 10/18/2021) 30 tablet 3   collagenase (SANTYL) ointment Apply topically daily to left ankle wound, nickel thick, followed by wet to dry dressing. (Patient not taking: Reported on 10/08/2021) 30 g 0   ferrous sulfate 325 (65 FE) MG tablet Take 1 tablet (325 mg total) by mouth daily with breakfast. 120 tablet 0   pantoprazole (PROTONIX) 40 MG tablet Take 1 tablet (40 mg total) by mouth daily. (Patient not taking: Reported on 10/18/2021) 90 tablet 1   No facility-administered medications prior to visit.    No Known Allergies  Review of Systems  Constitutional: Negative.  Negative for chills, fever and malaise/fatigue.  Respiratory:  Negative for cough and shortness of breath.   Cardiovascular:  Negative for chest pain, palpitations and leg swelling.  Gastrointestinal:  Negative for abdominal pain, blood in stool, constipation, diarrhea, nausea and vomiting.  Musculoskeletal:  Negative for back pain, falls, joint pain, myalgias and neck pain.       LLE pain  Skin: Negative.   Neurological: Negative.   Psychiatric/Behavioral:  Negative for depression. The patient is not nervous/anxious.   All other systems reviewed and are negative.     Objective:    Physical Exam Constitutional:      General: He is not in acute distress.    Appearance: Normal appearance. He is obese.  HENT:     Head: Normocephalic.  Cardiovascular:     Rate and Rhythm: Normal rate and regular rhythm.     Pulses: Normal pulses.     Heart sounds: Normal heart sounds.     Comments: No obvious peripheral edema Pulmonary:     Effort: Pulmonary effort is normal.     Breath sounds: Normal breath sounds.  Musculoskeletal:        General: No  swelling, tenderness, deformity or signs of injury. Normal range of motion.     Right lower leg: No edema.     Left lower leg: No edema.     Comments: Patient has orthoboot in place to LLE  Skin:    General: Skin is warm and dry.     Capillary Refill: Capillary refill takes less than 2 seconds.  Neurological:     General: No focal deficit present.     Mental Status: He is alert and oriented to person, place, and time.  Psychiatric:        Mood and Affect: Mood normal.        Behavior: Behavior normal.        Thought Content:  Thought content normal.        Judgment: Judgment normal.    BP 113/87   Pulse 82   Temp 98.8 F (37.1 C)   Ht 5' 6"  (1.676 m)   Wt 207 lb 9.6 oz (94.2 kg)   SpO2 98%   BMI 33.51 kg/m  Wt Readings from Last 3 Encounters:  10/18/21 207 lb 9.6 oz (94.2 kg)  10/08/21 202 lb 14.4 oz (92 kg)  07/12/21 205 lb 3.2 oz (93.1 kg)    Immunization History  Administered Date(s) Administered   Influenza,inj,Quad PF,6+ Mos 03/14/2021   Moderna Sars-Covid-2 Vaccination 05/14/2020, 07/17/2020   Pneumococcal Polysaccharide-23 03/14/2021    Diabetic Foot Exam - Simple   No data filed     Lab Results  Component Value Date   TSH 0.294 (L) 02/03/2019   Lab Results  Component Value Date   WBC 4.7 03/14/2021   HGB 12.7 (L) 03/14/2021   HCT 37.0 (L) 03/14/2021   MCV 78 (L) 03/14/2021   PLT 235 03/14/2021   Lab Results  Component Value Date   NA 139 01/28/2021   K 4.2 01/28/2021   CO2 23 01/28/2021   GLUCOSE 102 (H) 01/28/2021   BUN 8 01/28/2021   CREATININE 0.61 (L) 01/28/2021   BILITOT <0.2 01/28/2021   ALKPHOS 69 01/28/2021   AST 20 01/28/2021   ALT 29 01/28/2021   PROT 6.9 01/28/2021   ALBUMIN 4.1 01/28/2021   CALCIUM 9.2 01/28/2021   ANIONGAP 9 01/22/2021   EGFR 114 01/28/2021   Lab Results  Component Value Date   CHOL 114 01/15/2021   Lab Results  Component Value Date   HDL 45 01/15/2021   Lab Results  Component Value Date   LDLCALC  62 01/15/2021   Lab Results  Component Value Date   TRIG 35 01/15/2021   Lab Results  Component Value Date   CHOLHDL 2.5 01/15/2021   Lab Results  Component Value Date   HGBA1C 5.4 10/18/2021   HGBA1C 5.4 10/18/2021   HGBA1C 5.4 (A) 10/18/2021   HGBA1C 5.4 10/18/2021       Assessment & Plan:   Problem List Items Addressed This Visit       Cardiovascular and Mediastinum   PAD (peripheral artery disease) ([place]) Encouraged to maintain pre-existing treatment plan  Encouraged to maintain surgery appointment next week     Endocrine   Diabetes mellitus type 2, controlled ([place]) - Primary   Relevant Orders   HgB A1c (Completed): 5.4. at goal No changes in medications at this time Encouraged continued diet and exercise efforts  Encouraged continued compliance with medication   Follow up in 3 mths for reevaluation of of DM and left leg surgery, sooner as needed     I am having Langston Masker maintain his bacitracin, aspirin, ferrous sulfate, nicotine, citalopram, collagenase, gabapentin, metFORMIN, pantoprazole, and atorvastatin.  No orders of the defined types were placed in this encounter.    Teena Dunk, NP

## 2021-10-18 NOTE — Patient Instructions (Signed)
You were seen today in the [facility] for reevaluation DM. Labs were collected, results will be available via MyChart or, if abnormal, you will be contacted by clinic staff.  Please follow up in 3 mths for reevaluation of DM and left leg surgery ?

## 2021-10-24 ENCOUNTER — Other Ambulatory Visit (HOSPITAL_COMMUNITY): Payer: Self-pay

## 2021-10-24 ENCOUNTER — Encounter (HOSPITAL_COMMUNITY)
Admission: RE | Disposition: A | Discharge status: Home / Self Care | Payer: Self-pay | Source: Home / Self Care | Attending: Vascular Surgery

## 2021-10-24 ENCOUNTER — Ambulatory Visit (HOSPITAL_COMMUNITY)
Admission: RE | Admit: 2021-10-24 | Discharge: 2021-10-24 | Disposition: A | Discharge status: Home / Self Care | Payer: Medicaid Other | Attending: Vascular Surgery | Admitting: Vascular Surgery

## 2021-10-24 ENCOUNTER — Other Ambulatory Visit: Payer: Self-pay

## 2021-10-24 DIAGNOSIS — Z7984 Long term (current) use of oral hypoglycemic drugs: Secondary | ICD-10-CM | POA: Insufficient documentation

## 2021-10-24 DIAGNOSIS — F17210 Nicotine dependence, cigarettes, uncomplicated: Secondary | ICD-10-CM | POA: Insufficient documentation

## 2021-10-24 DIAGNOSIS — T82858A Stenosis of vascular prosthetic devices, implants and grafts, initial encounter: Secondary | ICD-10-CM

## 2021-10-24 DIAGNOSIS — E1151 Type 2 diabetes mellitus with diabetic peripheral angiopathy without gangrene: Secondary | ICD-10-CM | POA: Insufficient documentation

## 2021-10-24 DIAGNOSIS — Y832 Surgical operation with anastomosis, bypass or graft as the cause of abnormal reaction of the patient, or of later complication, without mention of misadventure at the time of the procedure: Secondary | ICD-10-CM | POA: Insufficient documentation

## 2021-10-24 DIAGNOSIS — I70212 Atherosclerosis of native arteries of extremities with intermittent claudication, left leg: Secondary | ICD-10-CM | POA: Insufficient documentation

## 2021-10-24 DIAGNOSIS — Z7982 Long term (current) use of aspirin: Secondary | ICD-10-CM | POA: Insufficient documentation

## 2021-10-24 HISTORY — PX: PERIPHERAL VASCULAR BALLOON ANGIOPLASTY: CATH118281

## 2021-10-24 HISTORY — PX: ABDOMINAL AORTOGRAM W/LOWER EXTREMITY: CATH118223

## 2021-10-24 LAB — POCT I-STAT, CHEM 8
BUN: 20 mg/dL (ref 6–20)
Calcium, Ion: 1.18 mmol/L (ref 1.15–1.40)
Chloride: 103 mmol/L (ref 98–111)
Creatinine, Ser: 0.8 mg/dL (ref 0.61–1.24)
Glucose, Bld: 96 mg/dL (ref 70–99)
HCT: 44 % (ref 39.0–52.0)
Hemoglobin: 15 g/dL (ref 13.0–17.0)
Potassium: 5.5 mmol/L — ABNORMAL HIGH (ref 3.5–5.1)
Sodium: 136 mmol/L (ref 135–145)
TCO2: 28 mmol/L (ref 22–32)

## 2021-10-24 LAB — GLUCOSE, CAPILLARY: Glucose-Capillary: 88 mg/dL (ref 70–99)

## 2021-10-24 SURGERY — ABDOMINAL AORTOGRAM W/LOWER EXTREMITY
Anesthesia: LOCAL

## 2021-10-24 MED ORDER — SODIUM CHLORIDE 0.9% FLUSH: 3.0000 mL | Freq: Two times a day (BID) | INTRAVENOUS | Status: DC

## 2021-10-24 MED ORDER — CLOPIDOGREL BISULFATE 75 MG PO TABS
75.0000 mg | ORAL_TABLET | Freq: Every day | ORAL | 11 refills | Status: DC
  Filled 2021-10-24: qty 30, 30d supply, fill #0

## 2021-10-24 MED ORDER — CLOPIDOGREL BISULFATE 300 MG PO TABS
ORAL_TABLET | ORAL | Status: AC
  Filled 2021-10-24: qty 1

## 2021-10-24 MED ORDER — FENTANYL CITRATE (PF) 100 MCG/2ML IJ SOLN
INTRAMUSCULAR | Status: AC
  Filled 2021-10-24: qty 2

## 2021-10-24 MED ORDER — ONDANSETRON HCL 4 MG/2ML IJ SOLN: 4.0000 mg | Freq: Four times a day (QID) | INTRAMUSCULAR | Status: DC | PRN

## 2021-10-24 MED ORDER — FENTANYL CITRATE (PF) 100 MCG/2ML IJ SOLN
INTRAMUSCULAR | Status: DC | PRN
  Administered 2021-10-24 (×2): 25 ug via INTRAVENOUS

## 2021-10-24 MED ORDER — SODIUM CHLORIDE 0.9% FLUSH: 3.0000 mL | INTRAVENOUS | Status: DC | PRN

## 2021-10-24 MED ORDER — LABETALOL HCL 5 MG/ML IV SOLN: 10.0000 mg | INTRAVENOUS | Status: DC | PRN

## 2021-10-24 MED ORDER — HEPARIN (PORCINE) IN NACL 1000-0.9 UT/500ML-% IV SOLN
INTRAVENOUS | Status: DC | PRN
  Administered 2021-10-24 (×2): 500 mL

## 2021-10-24 MED ORDER — HYDRALAZINE HCL 20 MG/ML IJ SOLN: 5.0000 mg | INTRAMUSCULAR | Status: DC | PRN

## 2021-10-24 MED ORDER — HEPARIN (PORCINE) IN NACL 1000-0.9 UT/500ML-% IV SOLN
INTRAVENOUS | Status: AC
  Filled 2021-10-24: qty 1000

## 2021-10-24 MED ORDER — LIDOCAINE HCL (PF) 1 % IJ SOLN
INTRAMUSCULAR | Status: AC
  Filled 2021-10-24: qty 30

## 2021-10-24 MED ORDER — ACETAMINOPHEN 325 MG PO TABS: 650.0000 mg | ORAL_TABLET | ORAL | Status: DC | PRN

## 2021-10-24 MED ORDER — MIDAZOLAM HCL 2 MG/2ML IJ SOLN
INTRAMUSCULAR | Status: AC
  Filled 2021-10-24: qty 2

## 2021-10-24 MED ORDER — IODIXANOL 320 MG/ML IV SOLN
INTRAVENOUS | Status: DC | PRN
  Administered 2021-10-24: 150 mL

## 2021-10-24 MED ORDER — CLOPIDOGREL BISULFATE 75 MG PO TABS: 75.0000 mg | ORAL_TABLET | Freq: Every day | ORAL | Status: DC

## 2021-10-24 MED ORDER — MIDAZOLAM HCL 2 MG/2ML IJ SOLN
INTRAMUSCULAR | Status: DC | PRN
  Administered 2021-10-24: 1 mg via INTRAVENOUS

## 2021-10-24 MED ORDER — CLOPIDOGREL BISULFATE 300 MG PO TABS
ORAL_TABLET | ORAL | Status: DC | PRN
Start: 2021-10-24 — End: 2021-10-24
  Administered 2021-10-24: 300 mg via ORAL

## 2021-10-24 MED ORDER — SODIUM CHLORIDE 0.9 % IV SOLN: 250.0000 mL | INTRAVENOUS | Status: DC | PRN

## 2021-10-24 MED ORDER — LIDOCAINE HCL (PF) 1 % IJ SOLN
INTRAMUSCULAR | Status: DC | PRN
  Administered 2021-10-24: 12 mL

## 2021-10-24 MED ORDER — HEPARIN SODIUM (PORCINE) 1000 UNIT/ML IJ SOLN
INTRAMUSCULAR | Status: DC | PRN
  Administered 2021-10-24: 9000 [IU] via INTRAVENOUS

## 2021-10-24 MED ORDER — SODIUM CHLORIDE 0.9 % IV SOLN: INTRAVENOUS | Status: DC

## 2021-10-24 MED ORDER — CLOPIDOGREL BISULFATE 75 MG PO TABS: 300.0000 mg | ORAL_TABLET | Freq: Once | ORAL | Status: DC

## 2021-10-24 SURGICAL SUPPLY — 28 items
BALLN CHOCOLATE 4.0X40X135 (BALLOONS) ×3
BALLN STERLING OTW 3X40X150 (BALLOONS) ×3
BALLN STERLING OTW 4X20X135 (BALLOONS) ×3
BALLOON CHOCOLATE 4.0X40X135 (BALLOONS) IMPLANT
BALLOON STERLING OTW 3X40X150 (BALLOONS) IMPLANT
BALLOON STERLING OTW 4X20X135 (BALLOONS) IMPLANT
CATH OMNI FLUSH 5F 65CM (CATHETERS) ×2 IMPLANT
CATH QUICKCROSS .018X135CM (MICROCATHETER) ×1 IMPLANT
DCB RANGER 4.0X60 135 (BALLOONS) IMPLANT
DCB RANGER 5.0X100 135 (BALLOONS) IMPLANT
DCB RANGER 5.0X40 135 (BALLOONS) IMPLANT
DEVICE CLOSURE MYNXGRIP 6/7F (Vascular Products) ×1 IMPLANT
KIT ENCORE 26 ADVANTAGE (KITS) ×1 IMPLANT
KIT MICROPUNCTURE NIT STIFF (SHEATH) ×1 IMPLANT
KIT PV (KITS) ×3 IMPLANT
RANGER DCB 4.0X60 135 (BALLOONS) ×3
RANGER DCB 5.0X100 135 (BALLOONS) ×3
RANGER DCB 5.0X40 135 (BALLOONS) ×3
SHEATH FLEX ANSEL ANG 5F 45CM (SHEATH) ×1 IMPLANT
SHEATH FLEX ANSEL ANG 6F 45CM (SHEATH) ×1 IMPLANT
SHEATH PINNACLE 6F 10CM (SHEATH) ×2 IMPLANT
SYR MEDRAD MARK 7 150ML (SYRINGE) ×3 IMPLANT
TRANSDUCER W/STOPCOCK (MISCELLANEOUS) ×3 IMPLANT
TRAY PV CATH (CUSTOM PROCEDURE TRAY) ×3 IMPLANT
WIRE BENTSON .035X145CM (WIRE) ×1 IMPLANT
WIRE G V18X300CM (WIRE) ×1 IMPLANT
WIRE ROSEN-J .035X180CM (WIRE) ×1 IMPLANT
WIRE SPARTACORE .014X300CM (WIRE) ×1 IMPLANT

## 2021-10-24 NOTE — H&P (Signed)
History and Physical Interval Note:  10/24/2021 11:37 AM  Laurita Quint  has presented today for surgery, with the diagnosis of PAD.  The various methods of treatment have been discussed with the patient and family. After consideration of risks, benefits and other options for treatment, the patient has consented to  Procedure(s): ABDOMINAL AORTOGRAM W/LOWER EXTREMITY (N/A) as a surgical intervention.  The patient's history has been reviewed, patient examined, no change in status, stable for surgery.  I have reviewed the patient's chart and labs.  Questions were answered to the patient's satisfaction.     Cephus Shelling  HISTORY AND PHYSICAL        CC:  follow up. Requesting Provider:  Barbette Merino, NP   HPI: This is a 56 y.o. male who is here today for follow up for PAD.  He has hx of Harvest of left leg great saphenous vein and Left above-knee popliteal artery to posterior tibial artery bypass at the ankle with reversed ipsilateral great saphenous vein By Dr. Chestine Spore on 01/14/21. He tolerated the surgery well and did well post operatively. He was discharge POD# 4. He presents for follow-up with non-invasive studies.   Pt was last seen 03/08/2021 and at that time, he was still having some pain in the area of the non healing wound on the left leg/foot.  He was following with wound care for management of these wounds.  He bypass graft was patent without evidence of stenosis.  He was still smoking.     The pt returns today for follow up.  He states he still has some numbness of his lower leg that was present before the bypass.  This has not gotten any worse. He does not have non healing wounds or rest pain.  He continues to smoke.  He is smoking about 1/2 ppd.    The pt is on a statin for cholesterol management.    The pt is on an aspirin.    Other AC:  none The pt is not on medication for hypertension.  The pt does have diabetes. Tobacco hx:  current           Past Medical History:   Diagnosis Date   Diabetes mellitus without complication (HCC)     Syphilis      Treated  02/14/2019           Past Surgical History:  Procedure Laterality Date   ABDOMINAL AORTOGRAM W/LOWER EXTREMITY N/A 01/11/2021    Procedure: ABDOMINAL AORTOGRAM W/LOWER EXTREMITY;  Surgeon: Leonie Douglas, MD;  Location: [facility];  Service: Cardiovascular;  Laterality: N/A;   NO PAST SURGERIES       THROMBECTOMY OF BYPASS GRAFT FEMORAL- TIBIAL ARTERY Left 01/14/2021    Procedure: LEFT ABOVE KNEE TO  POSTERIOR TIBIAL BYPASS GRAFT WITH GREATER SAPHENOUS VEIN;  Surgeon: Cephus Shelling, MD;  Location: [facility] OR;  Service: Vascular;  Laterality: Left;      No Known Allergies         Current Outpatient Medications  Medication Sig Dispense Refill   aspirin 81 MG EC tablet Take 1 tablet (81 mg total) by mouth daily. Swallow whole. 30 tablet 11   atorvastatin (LIPITOR) 40 MG tablet Take 1 tablet (40 mg total) by mouth daily. 90 tablet 1   bacitracin ointment Apply 1 application topically 2 (two) times daily. 120 g 0   citalopram (CELEXA) 20 MG tablet Take 1 tablet (20 mg total) by mouth daily. 30 tablet 3  collagenase (SANTYL) ointment Apply topically daily to left ankle wound, nickel thick, followed by wet to dry dressing. 30 g 0   ferrous sulfate 325 (65 FE) MG tablet Take 1 tablet (325 mg total) by mouth daily with breakfast. 120 tablet 0   gabapentin (NEURONTIN) 300 MG capsule Take 1 capsule (300 mg total) by mouth 2 (two) times daily. 180 capsule 1   metFORMIN (GLUCOPHAGE) 500 MG tablet Take 1 tablet (500 mg total) by mouth daily with breakfast. 90 tablet 1   nicotine (NICODERM CQ) 21 mg/24hr patch Place 1 patch (21 mg total) onto the skin daily. 28 patch 0   pantoprazole (PROTONIX) 40 MG tablet Take 1 tablet (40 mg total) by mouth daily. 90 tablet 1    No current facility-administered medications for this visit.      Family History  Family history unknown: Yes      Social History          Socioeconomic History   Marital status: Single      Spouse name: Not on file   Number of children: Not on file   Years of education: Not on file   Highest education level: Not on file  Occupational History   Occupation: Holiday representative  Tobacco Use   Smoking status: Every Day      Packs/day: 0.50      Types: Cigarettes   Smokeless tobacco: Never  Vaping Use   Vaping Use: Never used  Substance and Sexual Activity   Alcohol use: Yes      Alcohol/week: 24.0 standard drinks      Types: 4 Cans of beer, 20 Standard drinks or equivalent per week      Comment: Approx 20-25 beers week   Drug use: Yes      Frequency: 1.0 times per week      Types: Cocaine, Marijuana      Comment: smoke crack   Sexual activity: Not Currently  Other Topics Concern   Not on file  Social History Narrative   Not on file    Social Determinants of Health    Financial Resource Strain: Not on file  Food Insecurity: Not on file  Transportation Needs: Not on file  Physical Activity: Not on file  Stress: Not on file  Social Connections: Not on file  Intimate Partner Violence: Not on file        REVIEW OF SYSTEMS:     denotes positive finding,  denotes negative finding Cardiac   Comments:  Chest pain or chest pressure:      Shortness of breath upon exertion:      Short of breath when lying flat:      Irregular heart rhythm:             Vascular      Pain in calf, thigh, or hip brought on by ambulation:      Pain in feet at night that wakes you up from your sleep:       Blood clot in your veins:      Leg swelling:              Pulmonary      Oxygen at home:      Productive cough:       Wheezing:              Neurologic      Sudden weakness in arms or legs:       Sudden numbness in arms or legs:  Sudden onset of difficulty speaking or slurred speech:      Temporary loss of vision in one eye:       Problems with dizziness:              Gastrointestinal      Blood in  stool:       Vomited blood:              Genitourinary      Burning when urinating:       Blood in urine:             Psychiatric      Major depression:              Hematologic      Bleeding problems:      Problems with blood clotting too easily:             Skin      Rashes or ulcers:             Constitutional      Fever or chills:          PHYSICAL EXAMINATION:      Today's Vitals    10/08/21 1442  BP: (!) 143/85  Pulse: 72  Resp: 18  Temp: 97.6 F (36.4 C)  TempSrc: Temporal  SpO2: 98%  Weight: 202 lb 14.4 oz (92 kg)  Height: 5' 7.5" (1.715 m)  PainSc: 6     Body mass index is 31.31 kg/m.     General:  WDWN in NAD; vital signs documented above Gait: Not observed HENT: WNL, normocephalic Pulmonary: normal non-labored breathing , without wheezing Cardiac: regular HR, without carotid bruits Abdomen: soft, NT, no masses; aortic pulse is not palpable Skin: without rashes Vascular Exam/Pulses:   Right Left  Radial 2+ (normal) 2+ (normal)  Popliteal Unable to palpate Unable to palpate  DP Brisk biphasic Brisk monophasic  PT Brisk biphasic Brisk monophasic    Extremities: without ischemic changes, without Gangrene , without cellulitis; without open wounds;  Musculoskeletal: no muscle wasting or atrophy       Neurologic: A&O X 3 Psychiatric:  The pt has Normal affect.     Non-Invasive Vascular Imaging:   ABI's/TBI's on 10/08/2021: Right:  0.92/0.21 - Great toe pressure: 33 Left:  1.07/0.53 - Great toe pressure: 84   Arterial duplex on 10/08/2021: Left Graft #1: AK pop to distal PTA  +--------------------+--------+-------------+----------+-------------+                      PSV cm/sStenosis     Waveform  Comments       +--------------------+--------+-------------+----------+-------------+  Inflow              56                   monophasic               +--------------------+--------+-------------+----------+-------------+  Proximal  Anastomosis114                  monophasic               +--------------------+--------+-------------+----------+-------------+  Proximal Graft      418     >70% stenosismonophasictandem lesion  +--------------------+--------+-------------+----------+-------------+  Mid Graft           147                  monophasic               +--------------------+--------+-------------+----------+-------------+  Distal Graft        108                  monophasic               +--------------------+--------+-------------+----------+-------------+  Distal Anastomosis  121                  monophasic               +--------------------+--------+-------------+----------+-------------+  Outflow             116                  monophasic               +--------------------+--------+-------------+----------+-------------+   Summary:  Left: Patent stent with a tandem >70% stenosis just past the anastomosis.   Previous ABI's/TBI's on 03/08/2021: Right:  1.15/0.17 - Great toe pressure: 20 Left:  1.15/0.20 - Great toe pressure:  24   Previous arterial duplex on 03/08/2021: Left: Patent left above knee to posterior tibial artery graft with no  evidence of stenosis.     ASSESSMENT/PLAN:: 56 y.o. male here for follow up for hx of  Harvest of left leg great saphenous vein and Left above-knee popliteal artery to posterior tibial artery bypass at the ankle with reversed ipsilateral great saphenous vein By Dr. Chestine Spore on 01/14/21. He tolerated the surgery well and did well post operatively. He was discharge POD# 4. He presents for follow-up with non-invasive studies.   PAD -pt doing well but continues to have some numbness in the LLE below the knee, most likely related to lack of blood flow prior to bypass and may or may not improve. -pt toe pressure on the left improved, however, he has an elevated velocity of 418cm/s at the anastomosis of the bypass above the knee.  Will set up for  arteriogram to evaluate this as there is a tandem lesion present.  Pt is in agreement to proceed. -he is compliant with his asa/statin   Current smoker -discussed importance of smoking cessation.  Given pt is diabetic, he is at higher risk of limb loss especially if he continues to smoke. He expressed understanding.  He has cut back to 1/2 ppd.  Encouraged him to continue to cut back.      Doreatha Massed, [facility] [phone]   Clinic MD:   Chestine Spore

## 2021-10-24 NOTE — Op Note (Signed)
? ? ?Patient name: Tony Hicks MRN: [id number] DOB: 05-28-1966 Sex: male ? ?10/24/2021 ?Pre-operative Diagnosis: Threatened left above-knee popliteal artery to posterior tibial artery bypass with proximal high-grade stenosis in the bypass graft ?Post-operative diagnosis:  Same ?Surgeon:  Cephus Shelling, MD ?Procedure Performed: ?1.  Ultrasound-guided access right common femoral artery ?2.  Aortogram with catheter selection of aorta ?3.  Left lower extremity arteriogram with selection of third order branches  ?4.  Angioplasty of proximal vein bypass of the left lower extremity (3 mm x 40 mm Sterling, 4 mm x 60 mm drug coated Ranger, 4 mm x 20 mm Sterling, 4 mm x 40 mm Chocolate balloon, 5 mm x 40 Ranger, 5 mm x 20 mm Steriling) ?5.  Angioplasty of mid vein bypass of the left lower extremity (4 mm x 40 mm Chocolate balloon) ?6.  Angioplasty of native left SFA (5 mm x 100 mm drug-coated Ranger) ?7. 94 minutes of monitored moderate conscious sedation  ?8.  Mynx closure of the right common femoral artery ? ?Indications: Patient is a 56 year old male who previously underwent a left above-knee popliteal artery to posterior tibial artery bypass with a ipsilateral reversed saphenous vein by myself on 01/14/2021 for critical limb ischemia with toe wound as well as heel wound.  He was seen in the office recently for surveillance and noted to have high-grade stenosis in the proximal bypass with velocty 418.  He presents today for lower extremity arteriogram with possible intervention after risks and benefits discussed. ? ?Contrast: 100 mL  ? ?Findings:  ? ?Aortogram showed no flow-limiting stenosis in the aortoiliac segment.  Both renal arteries were patent.  Left lower extremity arteriogram showed a widely patent common femoral and profunda.  The mid SFA proximal to the bypass was calcified and diseased with approximate multiple segments of 50% stenosis.  Ultimately the above-knee popliteal artery to PT bypass at the ankle  was patent except for a high-grade greater than 80% stenosis in the proximal bypass that was very focal and a second approximate 50% stenosis in the mid bypass.  Widely patent runoff distally in the PT into the plantar arch. ? ?Ultimately, from right transfemoral access the proximal stenosis in the left leg bypass was angioplastied initially with a 3 mm Sterling and then 4 mm drug-coated Ranger.  I was not satisfied with the results given significant residual stenosis and I tried a shorter 4 mm x 20 mm Ranger for more radial force with no improvement.  I then used a 4 mm x 40 mm chocolate balloon and the lesion appeared to have about 30% residual stenosis.  I then took the chocolate balloon down to the mid calf and angioplasty a second approximate 50% stenosis with excellent results in the vein bypass and no residual stenosis.  The SFA disease proximal to the bypass was then angioplastied with a long segment 5 mm x 100 mm drug-coated Ranger to nominal pressure for 3 minutes.  We shot an arteriogram to look at these results and the proximal vein bypass stenosis already appeared to have recoil and looked significant again.  I then upsized to a 5 mm x 40 mm Ranger and 5 mm x 20 mm Sterling.  Less than 30% residual stenosis.  Patent flow in the bypass. ?  ?Procedure:  The patient was identified in the holding area and taken to room 8.  The patient was then placed supine on the table and prepped and draped in the usual sterile fashion.  A time  out was called.  Ultrasound was used to evaluate the right common femoral artery.  It was patent .  A digital ultrasound image was acquired.  A micropuncture needle was used to access the right common femoral artery under ultrasound guidance.  An 018 wire was advanced without resistance and a micropuncture sheath was placed.  The 018 wire was removed and a benson wire was placed.  The micropuncture sheath was exchanged for a 5 french sheath.  An omniflush catheter was advanced over  the wire to the level of L-1.  An abdominal angiogram was obtained.  Next, using the omniflush catheter and a benson wire, the aortic bifurcation was crossed and the catheter was placed into the the left external iliac artery and left runoff was obtained.  After evaluating images, elected to treat multiple diseased segments in the left lower extremity.  I ultimately used a long Rosen wire down the left SFA and put a long 6 [place] Ansell sheath in the right groin over the aortic bifurcation.  Patient was given 100 units/kg IV heparin.  I then used a 018 quick cross with a V18 to get down the left SFA and cannulate the bypass and got my wire all the way into the native PT distally through the bypass.  The proximal diseased bypass segment of greater than 80% stenosis was then angioplastied with a 3 mm Sterling and then 4 mm drug-coated Ranger and I was not satisfied the results of that and I used a 4 mm x 20 mm Sterling for more radial force with no improvement.  I then upsized to a 4 mm chocolate balloon and appeared to look about 30% residual stenosis and much better than initial imaging.  I then went down and treated a second stenosis in the mid bypass approximately 50% stenosis with a 4 mm chocolate balloon with excellent results and no residual stenosis.  I then treated the entire SFA disease segment with a long 5 mm x 100 mm drug-coated Ranger to nominal pressure for 3 minutes.  Excellent results with no significant residual stenosis and no dissection in the SFA.  I saw that the proximal bypass stenosis already appears to have recoiled and looks significant again.  I then tried upsizing to a 5 mm Ranger and then 5 mm x 20 mm Sterling.  This looked better with about 30% residual stenosis and I did not feel I could be more aggressive due to the vessel size.  Wires and catheters were removed.  A mynx closure device was deployed in the right groin after a short 6 French sheath was placed.  Taken to recovery in stable  condition. ? ?Plan: I will see the patient in 1 month with a left leg arterial duplex to see how his stenosis looks.  Plavix prescription sent for dual antiplatelet therapy.   ? ? ?Cephus Shelling, MD ?[facility] ?Office: [phone] ? ? ?

## 2021-10-25 ENCOUNTER — Encounter (HOSPITAL_COMMUNITY): Payer: Self-pay | Admitting: Vascular Surgery

## 2021-10-31 ENCOUNTER — Other Ambulatory Visit: Payer: Self-pay

## 2021-11-05 ENCOUNTER — Other Ambulatory Visit (HOSPITAL_COMMUNITY): Payer: Self-pay

## 2021-11-05 ENCOUNTER — Other Ambulatory Visit: Payer: Self-pay

## 2021-11-05 ENCOUNTER — Other Ambulatory Visit: Payer: Self-pay | Admitting: Nurse Practitioner

## 2021-11-05 MED ORDER — CITALOPRAM HYDROBROMIDE 20 MG PO TABS
20.0000 mg | ORAL_TABLET | Freq: Every day | ORAL | 3 refills | Status: DC
  Filled 2021-11-05 (×2): qty 30, 30d supply, fill #0
  Filled 2021-11-29: qty 30, 30d supply, fill #1
  Filled 2022-01-11 – 2022-01-17 (×2): qty 30, 30d supply, fill #2

## 2021-11-06 ENCOUNTER — Other Ambulatory Visit: Payer: Self-pay

## 2021-11-06 ENCOUNTER — Other Ambulatory Visit (HOSPITAL_COMMUNITY): Payer: Self-pay

## 2021-11-11 ENCOUNTER — Telehealth (HOSPITAL_COMMUNITY): Payer: Self-pay

## 2021-11-11 NOTE — Telephone Encounter (Signed)
Transitions of Care Pharmacy  ? ?Call attempted for a pharmacy transitions of care follow-up. Left call-back number with patient's mother. ? ?Call attempt #1. Will follow-up in 2-3 days.  ? ? ?

## 2021-11-13 ENCOUNTER — Telehealth (HOSPITAL_COMMUNITY): Payer: Self-pay | Admitting: Pharmacist

## 2021-11-13 ENCOUNTER — Other Ambulatory Visit (HOSPITAL_COMMUNITY): Payer: Self-pay

## 2021-11-13 NOTE — Telephone Encounter (Signed)
Pharmacy Transitions of Care Follow-up Telephone Call ? ?Date of discharge: 10/24/21  ?Discharge Diagnosis: surgery for PAD ? ?How have you been since you were released from the hospital? Leg continues to be painful at times, numb at other times, will ask vein doctor about this at upcoming appointment  ? ?Medication changes made at discharge: ? - START: clolpidogrel ? - STOPPED: n/a ? - CHANGED: n/a ? ?Medication changes verified by the patient? Yes ?  ? ?Medication Accessibility: ? ?Home Pharmacy: [facility] at [facility]  ? ?Was the patient provided with refills on discharged medications? yes  ? ?Have all prescriptions been transferred from [facility] to home pharmacy? N/a  ? ?Is the patient able to afford medications? Has ins, can also use [facility] discount price of $5/mo ?Notable copays: all generic ?Eligible patient assistance: n/a ?  ? ?Medication Review: ? ?CLOPIDOGREL (PLAVIX) ?Clopidogrel 75 mg once daily.  ?- Confirmed patient is taking both clopidogrel and ASA.   ?- Reviewed potential DDIs with patient  ?- Advised patient of medications to avoid (NSAIDs, ASA)  ?- Educated that Tylenol (acetaminophen) will be the preferred analgesic to prevent risk of bleeding  ?- Emphasized importance of monitoring for signs and symptoms of bleeding (abnormal bruising, prolonged bleeding, nose bleeds, bleeding from gums, discolored urine, black tarry stools)  ?- Advised patient to alert all providers of anticoagulation therapy prior to starting a new medication or having a procedure  ? ?Follow-up Appointments: ? ?PCP Hospital f/u appt confirmed? Pt is aware to follow-up ? ?Specialist Hospital f/u appt confirmed?  Scheduled to see vein specialist on 12/03/21.  ? ?If their condition worsens, is the pt aware to call PCP or go to the Emergency Dept.? yes ? ?Final Patient Assessment: ? ?Pt is doing well and reports compliance with clopidogrel.  Denies bleeding/bruising.  Is aware of upcoming  follow-up appointments.  ?

## 2021-11-13 NOTE — Telephone Encounter (Signed)
Transitions of Care Pharmacy   Call attempted for a pharmacy transitions of care follow-up. HIPAA appropriate voicemail was left with call back information provided.   Call attempt #2. Will follow-up in 2-3 days.    

## 2021-11-25 ENCOUNTER — Other Ambulatory Visit: Payer: Self-pay | Admitting: *Deleted

## 2021-11-25 DIAGNOSIS — I739 Peripheral vascular disease, unspecified: Secondary | ICD-10-CM

## 2021-11-29 ENCOUNTER — Other Ambulatory Visit (HOSPITAL_COMMUNITY): Payer: Self-pay

## 2021-11-29 ENCOUNTER — Other Ambulatory Visit: Payer: Self-pay

## 2021-12-03 ENCOUNTER — Ambulatory Visit (INDEPENDENT_AMBULATORY_CARE_PROVIDER_SITE_OTHER)
Admission: RE | Admit: 2021-12-03 | Discharge: 2021-12-03 | Disposition: A | Discharge status: Home / Self Care | Payer: Medicaid Other | Source: Ambulatory Visit | Attending: Vascular Surgery | Admitting: Vascular Surgery

## 2021-12-03 ENCOUNTER — Ambulatory Visit (INDEPENDENT_AMBULATORY_CARE_PROVIDER_SITE_OTHER): Payer: Medicaid Other | Admitting: Vascular Surgery

## 2021-12-03 ENCOUNTER — Encounter: Payer: Self-pay | Admitting: Vascular Surgery

## 2021-12-03 ENCOUNTER — Ambulatory Visit (HOSPITAL_COMMUNITY)
Admission: RE | Admit: 2021-12-03 | Discharge: 2021-12-03 | Disposition: A | Discharge status: Home / Self Care | Payer: Medicaid Other | Source: Ambulatory Visit | Attending: Vascular Surgery | Admitting: Vascular Surgery

## 2021-12-03 DIAGNOSIS — I70322 Atherosclerosis of unspecified type of bypass graft(s) of the extremities with rest pain, left leg: Secondary | ICD-10-CM

## 2021-12-03 DIAGNOSIS — I739 Peripheral vascular disease, unspecified: Secondary | ICD-10-CM

## 2021-12-03 NOTE — Progress Notes (Signed)
Patient name: Floyde Shake MRN: [id number] DOB: November 04, 1965 Sex: male  REASON FOR VISIT: Follow-up  HPI: Curvin Haggan is a 56 y.o. male that previously underwent a left above-knee popliteal artery to posterior tibial bypass with reversed ipsilateral great saphenous vein on 01/14/2021 for CLI with tissue loss including a left fourth toe ulceration and heel wound.  Ultimately his wounds healed.  He was found to have high-grade stenosis in the proximal bypass on recent surveillance.  On 10/24/2021 he underwent left lower extremity arteriogram including angioplasty of the proximal bypass where it was very resistant to intervention requiring a multitude of balloons.  We also angioplastied the mid portion of the bypass as well as the native SFA proximal to the bypass.  Reports no new issues today.  Still smoking.  Taking aspirin/plavix.    Past Medical History:  Diagnosis Date   Diabetes mellitus without complication ([place])    Syphilis    Treated  02/14/2019    Past Surgical History:  Procedure Laterality Date   ABDOMINAL AORTOGRAM W/LOWER EXTREMITY N/A 01/11/2021   Procedure: ABDOMINAL AORTOGRAM W/LOWER EXTREMITY;  Surgeon: Cherre Robins, MD;  Location: [facility];  Service: Cardiovascular;  Laterality: N/A;   ABDOMINAL AORTOGRAM W/LOWER EXTREMITY N/A 10/24/2021   Procedure: ABDOMINAL AORTOGRAM W/LOWER EXTREMITY;  Surgeon: Marty Heck, MD;  Location: [facility];  Service: Cardiovascular;  Laterality: N/A;   NO PAST SURGERIES     PERIPHERAL VASCULAR BALLOON ANGIOPLASTY  10/24/2021   Procedure: PERIPHERAL VASCULAR BALLOON ANGIOPLASTY;  Surgeon: Marty Heck, MD;  Location: [facility];  Service: Cardiovascular;;  Left SFA and Left Pop Bypass   THROMBECTOMY OF BYPASS GRAFT FEMORAL- TIBIAL ARTERY Left 01/14/2021   Procedure: LEFT ABOVE KNEE TO  POSTERIOR TIBIAL BYPASS GRAFT WITH GREATER SAPHENOUS VEIN;  Surgeon: Marty Heck, MD;  Location: [facility];  Service:  Vascular;  Laterality: Left;    Family History  Family history unknown: Yes    SOCIAL HISTORY: Social History   Tobacco Use   Smoking status: Every Day    Packs/day: 0.25    Types: Cigarettes   Smokeless tobacco: Never  Substance Use Topics   Alcohol use: Yes    Alcohol/week: 24.0 standard drinks    Types: 4 Cans of beer, 20 Standard drinks or equivalent per week    Comment: Approx 20-25 beers week    No Known Allergies  Current Outpatient Medications  Medication Sig Dispense Refill   aspirin 81 MG EC tablet Take 1 tablet (81 mg total) by mouth daily. Swallow whole. 30 tablet 11   citalopram (CELEXA) 20 MG tablet Take 1 tablet (20 mg total) by mouth daily. 30 tablet 3   clopidogrel (PLAVIX) 75 MG tablet Take 1 tablet (75 mg total) by mouth daily. 30 tablet 11   gabapentin (NEURONTIN) 300 MG capsule Take 1 capsule (300 mg total) by mouth 2 (two) times daily. 180 capsule 1   metFORMIN (GLUCOPHAGE) 500 MG tablet Take 1 tablet (500 mg total) by mouth daily with breakfast. 90 tablet 1   pantoprazole (PROTONIX) 40 MG tablet Take 1 tablet (40 mg total) by mouth daily. 90 tablet 1   atorvastatin (LIPITOR) 40 MG tablet Take 1 tablet (40 mg total) by mouth daily. (Patient not taking: Reported on 10/21/2021) 90 tablet 1   ferrous sulfate 325 (65 FE) MG tablet Take 1 tablet (325 mg total) by mouth daily with breakfast. 120 tablet 0   nicotine (NICODERM CQ) 21 mg/24hr  patch Place 1 patch (21 mg total) onto the skin daily. (Patient not taking: Reported on 12/03/2021) 28 patch 0   No current facility-administered medications for this visit.    REVIEW OF SYSTEMS:  [X]  denotes positive finding, [ ]  denotes negative finding Cardiac  Comments:  Chest pain or chest pressure:    Shortness of breath upon exertion:    Short of breath when lying flat:    Irregular heart rhythm:        Vascular    Pain in calf, thigh, or hip brought on by ambulation:    Pain in feet at night that wakes you up  from your sleep:     Blood clot in your veins:    Leg swelling:         Pulmonary    Oxygen at home:    Productive cough:     Wheezing:         Neurologic    Sudden weakness in arms or legs:     Sudden numbness in arms or legs:     Sudden onset of difficulty speaking or slurred speech:    Temporary loss of vision in one eye:     Problems with dizziness:         Gastrointestinal    Blood in stool:     Vomited blood:         Genitourinary    Burning when urinating:     Blood in urine:        Psychiatric    Major depression:         Hematologic    Bleeding problems:    Problems with blood clotting too easily:        Skin    Rashes or ulcers:        Constitutional    Fever or chills:      PHYSICAL EXAM: Vitals:   12/03/21 1545  BP: (!) 151/87  Pulse: 77  Resp: 18  Temp: 98 F (36.7 C)  TempSrc: Temporal  SpO2: 96%  Weight: 202 lb (91.6 kg)  Height: 5\' 6"  (1.676 m)    GENERAL: The patient is a well-nourished male, in no acute distress. The vital signs are documented above. CARDIAC: There is a regular rate and rhythm.  VASCULAR:  Palpable pulse in the left lower extremity bypass including a palpable PT at the ankle PULMONARY: No respiratory distress. ABDOMEN: Soft and non-tender. MUSCULOSKELETAL: There are no major deformities or cyanosis. NEUROLOGIC: No focal weakness or paresthesias are detected.   DATA:   Left lower extremity arterial duplex today shows widely patent bypass with no evidence of residual stenosis after recent intervention  Assessment/Plan:  56 yo M that previously underwent a left above-knee popliteal artery to posterior tibial bypass with reversed ipsilateral great saphenous vein on 01/14/2021 for CLI with tissue loss including a left fourth toe ulceration and heel wound.  Ultimately his wounds healed.  He was found to have high-grade stenosis in the proximal bypass on recent surveillance.  On 10/24/2021 he underwent left lower extremity  arteriogram including angioplasty of the proximal bypass where it was very resistant to intervention and we also angioplastied the mid portion of the bypass as well as the native SFA proximal to the bypass.  The left leg bypass looks widely patent on duplex today with no evidence of restenosis.  Asked that he stay on aspirin Plavix.  Discussed the importance of smoking cessation.  We will see him in 6 months in  the [facility] with left leg arterial duplex and ABIs for surveillance   Marty Heck, MD [facility]: [phone]

## 2021-12-06 ENCOUNTER — Other Ambulatory Visit: Payer: Self-pay

## 2021-12-06 ENCOUNTER — Other Ambulatory Visit: Payer: Self-pay | Admitting: *Deleted

## 2021-12-06 DIAGNOSIS — I70322 Atherosclerosis of unspecified type of bypass graft(s) of the extremities with rest pain, left leg: Secondary | ICD-10-CM

## 2021-12-06 DIAGNOSIS — I739 Peripheral vascular disease, unspecified: Secondary | ICD-10-CM

## 2021-12-09 ENCOUNTER — Other Ambulatory Visit: Payer: Self-pay

## 2021-12-13 ENCOUNTER — Other Ambulatory Visit: Payer: Self-pay

## 2022-01-13 ENCOUNTER — Other Ambulatory Visit (HOSPITAL_COMMUNITY): Payer: Self-pay

## 2022-01-13 NOTE — Telephone Encounter (Signed)
Error

## 2022-01-16 ENCOUNTER — Other Ambulatory Visit: Payer: Self-pay

## 2022-01-17 ENCOUNTER — Other Ambulatory Visit (HOSPITAL_COMMUNITY): Payer: Self-pay

## 2022-01-17 ENCOUNTER — Ambulatory Visit: Payer: PRIVATE HEALTH INSURANCE | Admitting: Nurse Practitioner

## 2022-01-17 ENCOUNTER — Other Ambulatory Visit: Payer: Self-pay

## 2022-01-27 ENCOUNTER — Encounter (INDEPENDENT_AMBULATORY_CARE_PROVIDER_SITE_OTHER): Payer: Self-pay | Admitting: Nurse Practitioner

## 2022-02-13 ENCOUNTER — Ambulatory Visit (INDEPENDENT_AMBULATORY_CARE_PROVIDER_SITE_OTHER): Payer: Self-pay | Admitting: Nurse Practitioner

## 2022-02-13 ENCOUNTER — Other Ambulatory Visit: Payer: Self-pay

## 2022-02-13 ENCOUNTER — Encounter: Payer: Self-pay | Admitting: Nurse Practitioner

## 2022-02-13 VITALS — BP 144/89 | HR 76 | Temp 97.5°F | Ht 66.0 in | Wt 206.2 lb

## 2022-02-13 DIAGNOSIS — G621 Alcoholic polyneuropathy: Secondary | ICD-10-CM

## 2022-02-13 DIAGNOSIS — L84 Corns and callosities: Secondary | ICD-10-CM

## 2022-02-13 DIAGNOSIS — K219 Gastro-esophageal reflux disease without esophagitis: Secondary | ICD-10-CM

## 2022-02-13 DIAGNOSIS — D509 Iron deficiency anemia, unspecified: Secondary | ICD-10-CM

## 2022-02-13 DIAGNOSIS — E118 Type 2 diabetes mellitus with unspecified complications: Secondary | ICD-10-CM

## 2022-02-13 LAB — POCT GLYCOSYLATED HEMOGLOBIN (HGB A1C)
HbA1c POC (<> result, manual entry): 5.5 % (ref 4.0–5.6)
HbA1c, POC (controlled diabetic range): 5.5 % (ref 0.0–7.0)
HbA1c, POC (prediabetic range): 5.5 % — AB (ref 5.7–6.4)
Hemoglobin A1C: 5.5 % (ref 4.0–5.6)

## 2022-02-13 MED ORDER — FERROUS SULFATE 325 (65 FE) MG PO TABS
325.0000 mg | ORAL_TABLET | Freq: Every day | ORAL | 0 refills | Status: DC
  Filled 2022-02-13: qty 30, 30d supply, fill #0
  Filled 2022-04-17: qty 30, 30d supply, fill #1
  Filled 2022-04-22 – 2022-06-05 (×2): qty 30, 30d supply, fill #0
  Filled 2022-07-18: qty 30, 30d supply, fill #1

## 2022-02-13 MED ORDER — GABAPENTIN 300 MG PO CAPS
300.0000 mg | ORAL_CAPSULE | Freq: Two times a day (BID) | ORAL | 1 refills | Status: DC
  Filled 2022-02-13: qty 60, 30d supply, fill #0
  Filled 2022-04-17: qty 60, 30d supply, fill #1
  Filled 2022-04-22: qty 60, 30d supply, fill #0

## 2022-02-13 MED ORDER — CITALOPRAM HYDROBROMIDE 20 MG PO TABS
20.0000 mg | ORAL_TABLET | Freq: Every day | ORAL | 3 refills | Status: DC
  Filled 2022-02-13: qty 30, 30d supply, fill #0
  Filled 2022-04-17: qty 30, 30d supply, fill #1
  Filled 2022-04-22 – 2022-06-05 (×2): qty 30, 30d supply, fill #0
  Filled 2022-07-18: qty 30, 30d supply, fill #1
  Filled 2022-08-20 – 2022-09-01 (×2): qty 30, 30d supply, fill #2
  Filled 2022-09-15: qty 30, 30d supply, fill #0
  Filled 2022-09-15: qty 30, 30d supply, fill #2

## 2022-02-13 MED ORDER — PANTOPRAZOLE SODIUM 40 MG PO TBEC
40.0000 mg | DELAYED_RELEASE_TABLET | Freq: Every day | ORAL | 1 refills | Status: DC
  Filled 2022-02-13: qty 30, 30d supply, fill #0
  Filled 2022-04-17: qty 30, 30d supply, fill #1
  Filled 2022-04-22 – 2022-06-05 (×2): qty 30, 30d supply, fill #0
  Filled 2022-08-20 – 2022-09-15 (×3): qty 30, 30d supply, fill #1
  Filled 2022-09-15: qty 30, 30d supply, fill #0

## 2022-02-13 MED ORDER — ATORVASTATIN CALCIUM 40 MG PO TABS
40.0000 mg | ORAL_TABLET | Freq: Every day | ORAL | 1 refills | Status: DC
  Filled 2022-02-13: qty 30, 30d supply, fill #0
  Filled 2022-04-17 – 2022-06-05 (×4): qty 30, 30d supply, fill #1
  Filled 2022-08-05: qty 30, 30d supply, fill #2
  Filled 2022-08-20: qty 30, 30d supply, fill #3
  Filled 2022-09-15 (×4): qty 30, 30d supply, fill #0
  Filled 2022-09-15: qty 90, 90d supply, fill #0

## 2022-02-13 MED ORDER — METFORMIN HCL 500 MG PO TABS
500.0000 mg | ORAL_TABLET | Freq: Every day | ORAL | 1 refills | Status: DC
  Filled 2022-02-13: qty 30, 30d supply, fill #0
  Filled 2022-04-17: qty 30, 30d supply, fill #1
  Filled 2022-04-22: qty 30, 30d supply, fill #0

## 2022-02-13 NOTE — Progress Notes (Signed)
@Patient  ID: , male    DOB: Apr 17, 1966, 56 y.o.   MRN: [id number]  Chief Complaint  Patient presents with   Diabetes    Pt is here for 3 month's DM follow up. Pt is requesting refill on all medications    Referring provider: No ref. provider found    HPI  Tony Hicks 56 y.o. male  has a past medical history of Diabetes mellitus without complication (HCC) and Syphilis. To the [facility] for reevaluation of chronic illness.    Diabetes Mellitus: Patient presents for follow up of diabetes. Symptoms: hyperglycemia and paresthesia of the feet.  Patient denies none.  Evaluation to date has been included: hemoglobin A1C.  Home sugars: patient does not check sugars. Treatment to date: no recent interventions.    Patient is requesting referral back to podiatry for calluses to both feet.    Denies any other concerns today. Denies any fatigue, chest pain, shortness of breath, HA or dizziness. Denies any blurred vision, numbness or tingling.       No Known Allergies  Immunization History  Administered Date(s) Administered   Influenza,inj,Quad PF,6+ Mos 03/14/2021   Moderna Sars-Covid-2 Vaccination 05/14/2020, 07/17/2020   Pneumococcal Polysaccharide-23 03/14/2021    Past Medical History:  Diagnosis Date   Diabetes mellitus without complication (HCC)    Syphilis    Treated  02/14/2019    Tobacco History: Social History   Tobacco Use  Smoking Status Every Day   Packs/day: 0.25   Types: Cigarettes  Smokeless Tobacco Never   Ready to quit: Not Answered Counseling given: Not Answered   Outpatient Encounter Medications as of 02/13/2022  Medication Sig   aspirin 81 MG EC tablet Take 1 tablet (81 mg total) by mouth daily. Swallow whole.   clopidogrel (PLAVIX) 75 MG tablet Take 1 tablet (75 mg total) by mouth daily.   nicotine (NICODERM CQ) 21 mg/24hr patch Place 1 patch (21 mg total) onto the skin daily.   [DISCONTINUED] citalopram (CELEXA) 20 MG tablet Take 1 tablet (20  mg total) by mouth daily.   [DISCONTINUED] metFORMIN (GLUCOPHAGE) 500 MG tablet Take 1 tablet (500 mg total) by mouth daily with breakfast.   atorvastatin (LIPITOR) 40 MG tablet Take 1 tablet (40 mg total) by mouth daily.   citalopram (CELEXA) 20 MG tablet Take 1 tablet (20 mg total) by mouth daily.   ferrous sulfate 325 (65 FE) MG tablet Take 1 tablet (325 mg total) by mouth daily with breakfast.   gabapentin (NEURONTIN) 300 MG capsule Take 1 capsule (300 mg total) by mouth 2 (two) times daily.   metFORMIN (GLUCOPHAGE) 500 MG tablet Take 1 tablet (500 mg total) by mouth daily with breakfast.   pantoprazole (PROTONIX) 40 MG tablet Take 1 tablet (40 mg total) by mouth daily.   [DISCONTINUED] atorvastatin (LIPITOR) 40 MG tablet Take 1 tablet (40 mg total) by mouth daily. (Patient not taking: Reported on 10/21/2021)   [DISCONTINUED] ferrous sulfate 325 (65 FE) MG tablet Take 1 tablet (325 mg total) by mouth daily with breakfast.   [DISCONTINUED] gabapentin (NEURONTIN) 300 MG capsule Take 1 capsule (300 mg total) by mouth 2 (two) times daily.   [DISCONTINUED] pantoprazole (PROTONIX) 40 MG tablet Take 1 tablet (40 mg total) by mouth daily.   No facility-administered encounter medications on file as of 02/13/2022.     Review of Systems  Review of Systems  Constitutional: Negative.   HENT: Negative.    Cardiovascular: Negative.   Gastrointestinal: Negative.   Allergic/Immunologic: Negative.  Neurological: Negative.   Psychiatric/Behavioral: Negative.         Physical Exam  BP (!) 144/89 (BP Location: Right Arm, Patient Position: Sitting, Cuff Size: Large)   Pulse 76   Temp (!) 97.5 F (36.4 C)   Ht 5\' 6"  (1.676 m)   Wt 206 lb 3.2 oz (93.5 kg)   SpO2 100%   BMI 33.28 kg/m   Wt Readings from Last 5 Encounters:  02/13/22 206 lb 3.2 oz (93.5 kg)  12/03/21 202 lb (91.6 kg)  10/24/21 207 lb (93.9 kg)  10/18/21 207 lb 9.6 oz (94.2 kg)  10/08/21 202 lb 14.4 oz (92 kg)     Physical  Exam Vitals and nursing note reviewed.  Constitutional:      General: He is not in acute distress.    Appearance: He is well-developed.  Cardiovascular:     Rate and Rhythm: Normal rate and regular rhythm.  Pulmonary:     Effort: Pulmonary effort is normal.     Breath sounds: Normal breath sounds.  Skin:    General: Skin is warm and dry.  Neurological:     Mental Status: He is alert and oriented to person, place, and time.      Lab Results:  CBC    Component Value Date/Time   WBC 4.7 03/14/2021 1204   WBC 6.1 01/22/2021 2118   RBC 4.74 03/14/2021 1204   RBC 4.15 (L) 01/22/2021 2118   HGB 15.0 10/24/2021 0817   HGB 12.7 (L) 03/14/2021 1204   HCT 44.0 10/24/2021 0817   HCT 37.0 (L) 03/14/2021 1204   PLT 235 03/14/2021 1204   MCV 78 (L) 03/14/2021 1204   MCH 26.8 03/14/2021 1204   MCH 21.2 (L) 01/22/2021 2118   MCHC 34.3 03/14/2021 1204   MCHC 30.2 01/22/2021 2118   RDW 25.1 (H) 03/14/2021 1204   LYMPHSABS 1.7 03/14/2021 1204   MONOABS 0.5 01/22/2021 2118   EOSABS 0.1 03/14/2021 1204   BASOSABS 0.0 03/14/2021 1204    BMET    Component Value Date/Time   NA 136 10/24/2021 0817   NA 139 01/28/2021 1202   K 5.5 (H) 10/24/2021 0817   CL 103 10/24/2021 0817   CO2 23 01/28/2021 1202   GLUCOSE 96 10/24/2021 0817   BUN 20 10/24/2021 0817   BUN 8 01/28/2021 1202   CREATININE 0.80 10/24/2021 0817   CALCIUM 9.2 01/28/2021 1202   GFRNONAA >60 01/22/2021 2118   GFRAA 101 02/17/2019 1543     Assessment & Plan:   Diabetes mellitus type 2, controlled (HCC) - POCT glycosylated hemoglobin (Hb A1C) - CBC - Comprehensive metabolic panel - metFORMIN (GLUCOPHAGE) 500 MG tablet; Take 1 tablet (500 mg total) by mouth daily with breakfast.  Dispense: 90 tablet; Refill: 1  2. Neuropathy, alcoholic (HCC)  - gabapentin (NEURONTIN) 300 MG capsule; Take 1 capsule (300 mg total) by mouth 2 (two) times daily.  Dispense: 180 capsule; Refill: 1  3. Microcytic anemia  - ferrous  sulfate 325 (65 FE) MG tablet; Take 1 tablet (325 mg total) by mouth daily with breakfast.  Dispense: 120 tablet; Refill: 0  4. Gastroesophageal reflux disease, unspecified whether esophagitis present  - pantoprazole (PROTONIX) 40 MG tablet; Take 1 tablet (40 mg total) by mouth daily.  Dispense: 90 tablet; Refill: 1   5. Callus of foot  - Ambulatory referral to Podiatry    Follow up:  Follow up in 3 months      02/19/2019, NP 02/13/2022

## 2022-02-13 NOTE — Patient Instructions (Addendum)
1. Controlled type 2 diabetes mellitus with complication, without long-term current use of insulin (HCC)  - POCT glycosylated hemoglobin (Hb A1C) - CBC - Comprehensive metabolic panel - metFORMIN (GLUCOPHAGE) 500 MG tablet; Take 1 tablet (500 mg total) by mouth daily with breakfast.  Dispense: 90 tablet; Refill: 1  2. Neuropathy, alcoholic (HCC)  - gabapentin (NEURONTIN) 300 MG capsule; Take 1 capsule (300 mg total) by mouth 2 (two) times daily.  Dispense: 180 capsule; Refill: 1  3. Microcytic anemia  - ferrous sulfate 325 (65 FE) MG tablet; Take 1 tablet (325 mg total) by mouth daily with breakfast.  Dispense: 120 tablet; Refill: 0  4. Gastroesophageal reflux disease, unspecified whether esophagitis present  - pantoprazole (PROTONIX) 40 MG tablet; Take 1 tablet (40 mg total) by mouth daily.  Dispense: 90 tablet; Refill: 1   5. Callus of foot  - Ambulatory referral to Podiatry    Follow up:  Follow up in 3 months

## 2022-02-13 NOTE — Assessment & Plan Note (Signed)
-   POCT glycosylated hemoglobin (Hb A1C) - CBC - Comprehensive metabolic panel - metFORMIN (GLUCOPHAGE) 500 MG tablet; Take 1 tablet (500 mg total) by mouth daily with breakfast.  Dispense: 90 tablet; Refill: 1  2. Neuropathy, alcoholic (HCC)  - gabapentin (NEURONTIN) 300 MG capsule; Take 1 capsule (300 mg total) by mouth 2 (two) times daily.  Dispense: 180 capsule; Refill: 1  3. Microcytic anemia  - ferrous sulfate 325 (65 FE) MG tablet; Take 1 tablet (325 mg total) by mouth daily with breakfast.  Dispense: 120 tablet; Refill: 0  4. Gastroesophageal reflux disease, unspecified whether esophagitis present  - pantoprazole (PROTONIX) 40 MG tablet; Take 1 tablet (40 mg total) by mouth daily.  Dispense: 90 tablet; Refill: 1   5. Callus of foot  - Ambulatory referral to Podiatry    Follow up:  Follow up in 3 months

## 2022-02-14 LAB — COMPREHENSIVE METABOLIC PANEL
ALT: 13 IU/L (ref 0–44)
AST: 17 IU/L (ref 0–40)
Albumin/Globulin Ratio: 1.7 (ref 1.2–2.2)
Albumin: 4.6 g/dL (ref 3.8–4.9)
Alkaline Phosphatase: 71 IU/L (ref 44–121)
BUN/Creatinine Ratio: 11 (ref 9–20)
BUN: 10 mg/dL (ref 6–24)
Bilirubin Total: 0.3 mg/dL (ref 0.0–1.2)
CO2: 23 mmol/L (ref 20–29)
Calcium: 9.2 mg/dL (ref 8.7–10.2)
Chloride: 103 mmol/L (ref 96–106)
Creatinine, Ser: 0.89 mg/dL (ref 0.76–1.27)
Globulin, Total: 2.7 g/dL (ref 1.5–4.5)
Glucose: 81 mg/dL (ref 70–99)
Potassium: 5 mmol/L (ref 3.5–5.2)
Sodium: 140 mmol/L (ref 134–144)
Total Protein: 7.3 g/dL (ref 6.0–8.5)
eGFR: 101 mL/min/{1.73_m2} (ref >59)

## 2022-02-14 LAB — CBC
Hematocrit: 42.7 % (ref 37.5–51.0)
Hemoglobin: 15 g/dL (ref 13.0–17.7)
MCH: 31.7 pg (ref 26.6–33.0)
MCHC: 35.1 g/dL (ref 31.5–35.7)
MCV: 90 fL (ref 79–97)
Platelets: 179 10*3/uL (ref 150–450)
RBC: 4.73 x10E6/uL (ref 4.14–5.80)
RDW: 12.5 % (ref 11.6–15.4)
WBC: 3.7 10*3/uL (ref 3.4–10.8)

## 2022-02-17 ENCOUNTER — Other Ambulatory Visit: Payer: Self-pay

## 2022-03-05 ENCOUNTER — Ambulatory Visit: Payer: Self-pay | Admitting: Podiatry

## 2022-04-02 NOTE — Progress Notes (Signed)
erroneous

## 2022-04-17 ENCOUNTER — Other Ambulatory Visit: Payer: Self-pay

## 2022-04-18 ENCOUNTER — Other Ambulatory Visit: Payer: Self-pay

## 2022-04-21 ENCOUNTER — Other Ambulatory Visit: Payer: Self-pay

## 2022-04-22 ENCOUNTER — Other Ambulatory Visit: Payer: Self-pay

## 2022-04-22 ENCOUNTER — Other Ambulatory Visit (HOSPITAL_COMMUNITY): Payer: Self-pay

## 2022-04-28 ENCOUNTER — Other Ambulatory Visit (HOSPITAL_COMMUNITY): Payer: Self-pay

## 2022-04-28 ENCOUNTER — Other Ambulatory Visit: Payer: Self-pay

## 2022-04-30 ENCOUNTER — Other Ambulatory Visit (HOSPITAL_COMMUNITY): Payer: Self-pay

## 2022-04-30 ENCOUNTER — Other Ambulatory Visit: Payer: Self-pay

## 2022-04-30 ENCOUNTER — Telehealth: Payer: Self-pay | Admitting: Nurse Practitioner

## 2022-04-30 NOTE — Telephone Encounter (Signed)
  Morphine  Aspirin  Gabapentin

## 2022-05-05 ENCOUNTER — Other Ambulatory Visit (HOSPITAL_COMMUNITY): Payer: Self-pay

## 2022-05-14 ENCOUNTER — Other Ambulatory Visit: Payer: Self-pay | Admitting: Nurse Practitioner

## 2022-05-14 DIAGNOSIS — G621 Alcoholic polyneuropathy: Secondary | ICD-10-CM

## 2022-05-14 DIAGNOSIS — E118 Type 2 diabetes mellitus with unspecified complications: Secondary | ICD-10-CM

## 2022-05-14 MED ORDER — ASPIRIN 81 MG PO TBEC: 81.0000 mg | DELAYED_RELEASE_TABLET | Freq: Every day | ORAL | 11 refills | Status: AC

## 2022-05-14 MED ORDER — METFORMIN HCL 500 MG PO TABS: 500.0000 mg | ORAL_TABLET | Freq: Every day | ORAL | 1 refills | Status: DC

## 2022-05-14 MED ORDER — GABAPENTIN 300 MG PO CAPS: 300.0000 mg | ORAL_CAPSULE | Freq: Two times a day (BID) | ORAL | 1 refills | Status: DC

## 2022-05-14 NOTE — Telephone Encounter (Signed)
Tony Hicks I don't think this was routed correctly to you before. Pt called again requesting medication

## 2022-05-15 ENCOUNTER — Telehealth: Payer: Self-pay | Admitting: Nurse Practitioner

## 2022-05-15 NOTE — Telephone Encounter (Signed)
Pt called saying his meds need to be delivered to his house not sent to [facility]

## 2022-05-16 ENCOUNTER — Ambulatory Visit: Payer: Self-pay | Admitting: Nurse Practitioner

## 2022-05-19 ENCOUNTER — Telehealth: Payer: Self-pay | Admitting: Nurse Practitioner

## 2022-05-19 NOTE — Telephone Encounter (Signed)
Tried to call pt back and all the numbers are disconnected. KH

## 2022-05-19 NOTE — Telephone Encounter (Signed)
Pt's number has been update please call himback at [phone]

## 2022-05-21 ENCOUNTER — Telehealth (INDEPENDENT_AMBULATORY_CARE_PROVIDER_SITE_OTHER): Payer: Self-pay | Admitting: Nurse Practitioner

## 2022-05-21 ENCOUNTER — Encounter: Payer: Self-pay | Admitting: Nurse Practitioner

## 2022-05-21 ENCOUNTER — Other Ambulatory Visit: Payer: Self-pay

## 2022-05-21 DIAGNOSIS — Z7984 Long term (current) use of oral hypoglycemic drugs: Secondary | ICD-10-CM

## 2022-05-21 DIAGNOSIS — E118 Type 2 diabetes mellitus with unspecified complications: Secondary | ICD-10-CM

## 2022-05-21 NOTE — Progress Notes (Signed)
Virtual Visit via Telephone Note  I connected with Tony Hicks on 05/21/22 at 10:20 AM EST by telephone and verified that I am speaking with the correct person using two identifiers.  Location: Patient: home Provider: office   I discussed the limitations, risks, security and privacy concerns of performing an evaluation and management service by telephone and the availability of in person appointments. I also discussed with the patient that there may be a patient responsible charge related to this service. The patient expressed understanding and agreed to proceed.   History of Present Illness:  Tony Hicks 56 y.o. male  has a past medical history of Diabetes mellitus without complication (HCC) and Syphilis. To the [facility] for reevaluation of chronic illness.     Patient presents today for a telephone visit.   Diabetes Mellitus: Patient presents for follow up of diabetes. Symptoms: hyperglycemia and paresthesia of the feet.  Patient denies none.  Evaluation to date has been included: hemoglobin A1C.  Home sugars: patient does not check sugars. Treatment to date: no recent interventions.      Denies any other concerns today. Denies any fatigue, chest pain, shortness of breath, HA or dizziness. Denies any blurred vision, numbness or tingling.    Observations/Objective:     02/13/2022   11:33 AM 12/03/2021    3:45 PM 10/24/2021    4:15 PM  Vitals with BMI  Height 5\' 6"  5\' 6"    Weight 206 lbs 3 oz 202 lbs   BMI 33.3 32.62   Systolic 144 151  Diastolic 89 87 81  Pulse 76 77 62      Assessment and Plan:  1. Controlled type 2 diabetes mellitus with complication, without long-term current use of insulin (HCC)  Continue current medications  Stay active  Diabetic diet  Follow up:  Follow up in 3 months     I discussed the assessment and treatment plan with the patient. The patient was provided an opportunity to ask questions and all were answered. The patient agreed  with the plan and demonstrated an understanding of the instructions.   The patient was advised to call back or seek an in-person evaluation if the symptoms worsen or if the condition fails to improve as anticipated.  I provided 23 minutes of non-face-to-face time during this encounter.   , NP

## 2022-05-21 NOTE — Patient Instructions (Signed)
1. Controlled type 2 diabetes mellitus with complication, without long-term current use of insulin (HCC)  Continue current medications  Stay active  Diabetic diet  Follow up:  Follow up in 3 months

## 2022-05-21 NOTE — Telephone Encounter (Signed)
Tony Hicks has spoke to pt . KH

## 2022-06-05 ENCOUNTER — Other Ambulatory Visit: Payer: Self-pay

## 2022-06-26 ENCOUNTER — Other Ambulatory Visit: Payer: Self-pay

## 2022-07-18 ENCOUNTER — Other Ambulatory Visit (HOSPITAL_COMMUNITY): Payer: Self-pay

## 2022-07-21 ENCOUNTER — Other Ambulatory Visit: Payer: Self-pay

## 2022-07-31 ENCOUNTER — Other Ambulatory Visit (HOSPITAL_COMMUNITY): Payer: Self-pay

## 2022-08-05 ENCOUNTER — Other Ambulatory Visit: Payer: Self-pay

## 2022-08-14 ENCOUNTER — Other Ambulatory Visit: Payer: Self-pay | Admitting: *Deleted

## 2022-08-14 DIAGNOSIS — I739 Peripheral vascular disease, unspecified: Secondary | ICD-10-CM

## 2022-08-14 DIAGNOSIS — I70322 Atherosclerosis of unspecified type of bypass graft(s) of the extremities with rest pain, left leg: Secondary | ICD-10-CM

## 2022-08-20 ENCOUNTER — Other Ambulatory Visit: Payer: Self-pay

## 2022-08-20 ENCOUNTER — Other Ambulatory Visit (HOSPITAL_COMMUNITY): Payer: Self-pay

## 2022-08-21 ENCOUNTER — Ambulatory Visit: Payer: Self-pay | Admitting: Nurse Practitioner

## 2022-08-28 ENCOUNTER — Other Ambulatory Visit (HOSPITAL_COMMUNITY): Payer: Self-pay

## 2022-08-29 ENCOUNTER — Ambulatory Visit: Payer: Self-pay | Admitting: Nurse Practitioner

## 2022-09-01 ENCOUNTER — Other Ambulatory Visit: Payer: Self-pay

## 2022-09-02 ENCOUNTER — Ambulatory Visit: Payer: Medicaid Other

## 2022-09-02 ENCOUNTER — Other Ambulatory Visit: Payer: Self-pay

## 2022-09-02 ENCOUNTER — Ambulatory Visit (HOSPITAL_COMMUNITY): Payer: Medicaid Other

## 2022-09-02 ENCOUNTER — Other Ambulatory Visit (HOSPITAL_COMMUNITY): Payer: Self-pay

## 2022-09-04 ENCOUNTER — Other Ambulatory Visit: Payer: Self-pay

## 2022-09-15 ENCOUNTER — Other Ambulatory Visit (HOSPITAL_COMMUNITY): Payer: Self-pay

## 2022-09-15 ENCOUNTER — Other Ambulatory Visit: Payer: Self-pay

## 2022-09-16 ENCOUNTER — Ambulatory Visit (HOSPITAL_COMMUNITY)
Admission: RE | Admit: 2022-09-16 | Discharge: 2022-09-16 | Disposition: A | Discharge status: Home / Self Care | Payer: Medicaid Other | Source: Ambulatory Visit | Attending: Vascular Surgery | Admitting: Vascular Surgery

## 2022-09-16 ENCOUNTER — Ambulatory Visit (INDEPENDENT_AMBULATORY_CARE_PROVIDER_SITE_OTHER)
Admission: RE | Admit: 2022-09-16 | Discharge: 2022-09-16 | Disposition: A | Discharge status: Home / Self Care | Payer: Medicaid Other | Source: Ambulatory Visit | Attending: Vascular Surgery | Admitting: Vascular Surgery

## 2022-09-16 ENCOUNTER — Ambulatory Visit (INDEPENDENT_AMBULATORY_CARE_PROVIDER_SITE_OTHER): Payer: Medicaid Other | Admitting: Physician Assistant

## 2022-09-16 ENCOUNTER — Other Ambulatory Visit: Payer: Self-pay

## 2022-09-16 VITALS — BP 124/76 | HR 88 | Temp 98.6°F | Ht 66.0 in

## 2022-09-16 DIAGNOSIS — I70322 Atherosclerosis of unspecified type of bypass graft(s) of the extremities with rest pain, left leg: Secondary | ICD-10-CM | POA: Diagnosis not present

## 2022-09-16 DIAGNOSIS — I739 Peripheral vascular disease, unspecified: Secondary | ICD-10-CM | POA: Diagnosis not present

## 2022-09-16 DIAGNOSIS — E1151 Type 2 diabetes mellitus with diabetic peripheral angiopathy without gangrene: Secondary | ICD-10-CM | POA: Insufficient documentation

## 2022-09-16 DIAGNOSIS — I70323 Atherosclerosis of unspecified type of bypass graft(s) of the extremities with rest pain, bilateral legs: Secondary | ICD-10-CM | POA: Insufficient documentation

## 2022-09-16 LAB — VAS US ABI WITH/WO TBI
Left ABI: 0.16
Right ABI: 0.93

## 2022-09-16 NOTE — Progress Notes (Addendum)
HISTORY AND PHYSICAL     CC:  follow up. Requesting Provider:  Fenton Foy, NP  HPI: This is a 57 y.o. male who is here today for follow up for PAD.  Pt has hx of harvest of left leg great saphenous vein and Left above-knee popliteal artery to posterior tibial artery bypass at the ankle with reversed ipsilateral great saphenous vein By Dr. [name] on 01/14/21. He tolerated the surgery well and did well post operatively. He was discharge POD# 4. He presents for follow-up with non-invasive studies.  In April 2023, he had a duplex that revealed elevated velocities at the anastomosis of the bypass above the knee.  He underwent arteriogram LLE on 10/24/2021 with angioplasty of proximal and mid vein bypass, angioplasty of native left SFA by Dr. [name].   Pt was last seen 12/03/2021 by Dr. [name] and at that time, his bypass was widely patent without evidence of restenosis.  He was encouraged to continue his asa and plavix.  He was scheduled for 6 month follow up.    The pt returns today for follow up.  He states that he has had left foot pain since his surgery.  He bumped his left great toe about 1-2 weeks ago and it is painful.  He states that his left heel is also painful.  He states he has pain in the foot at night.  He states he has trouble getting up at night and does have some crutches he can use.   He cannot really say that his foot pain has gotten worse and has not significantly changed.  He does not have any issues with his right foot.  He has been compliant with his asa and Lipitor but has not been taking his Plavix.  He continues to smoke but has cut back.  He states he soaks his foot at night and uses a grease to moisturize his skin as lotion doesn't work.    The pt is on a statin for cholesterol management.    The pt is on an aspirin.    Other AC:  Plavix The pt is not on medication for hypertension.  The pt does have diabetes. Tobacco hx:  current   Past Medical History:  Diagnosis Date    Diabetes mellitus without complication ([place])    Syphilis    Treated  02/14/2019    Past Surgical History:  Procedure Laterality Date   ABDOMINAL AORTOGRAM W/LOWER EXTREMITY N/A 01/11/2021   Procedure: ABDOMINAL AORTOGRAM W/LOWER EXTREMITY;  Surgeon: Cherre Robins, MD;  Location: [facility];  Service: Cardiovascular;  Laterality: N/A;   ABDOMINAL AORTOGRAM W/LOWER EXTREMITY N/A 10/24/2021   Procedure: ABDOMINAL AORTOGRAM W/LOWER EXTREMITY;  Surgeon: Marty Heck, MD;  Location: [facility];  Service: Cardiovascular;  Laterality: N/A;   NO PAST SURGERIES     PERIPHERAL VASCULAR BALLOON ANGIOPLASTY  10/24/2021   Procedure: PERIPHERAL VASCULAR BALLOON ANGIOPLASTY;  Surgeon: Marty Heck, MD;  Location: [facility];  Service: Cardiovascular;;  Left SFA and Left Pop Bypass   THROMBECTOMY OF BYPASS GRAFT FEMORAL- TIBIAL ARTERY Left 01/14/2021   Procedure: LEFT ABOVE KNEE TO  POSTERIOR TIBIAL BYPASS GRAFT WITH GREATER SAPHENOUS VEIN;  Surgeon: Marty Heck, MD;  Location: [facility];  Service: Vascular;  Laterality: Left;    No Known Allergies  Current Outpatient Medications  Medication Sig Dispense Refill   aspirin EC 81 MG tablet Take 1 tablet (81 mg total) by mouth daily. Swallow whole.  30 tablet 11   atorvastatin (LIPITOR) 40 MG tablet Take 1 tablet (40 mg total) by mouth daily. 90 tablet 1   citalopram (CELEXA) 20 MG tablet Take 1 tablet (20 mg total) by mouth daily. 30 tablet 3   clopidogrel (PLAVIX) 75 MG tablet Take 1 tablet (75 mg total) by mouth daily. 30 tablet 11   ferrous sulfate 325 (65 FE) MG tablet Take 1 tablet (325 mg total) by mouth daily with breakfast. 120 tablet 0   gabapentin (NEURONTIN) 300 MG capsule Take 1 capsule (300 mg total) by mouth 2 (two) times daily. 180 capsule 1   metFORMIN (GLUCOPHAGE) 500 MG tablet Take 1 tablet (500 mg total) by mouth daily with breakfast. 90 tablet 1   nicotine (NICODERM CQ) 21 mg/24hr patch Place 1  patch (21 mg total) onto the skin daily. 28 patch 0   pantoprazole (PROTONIX) 40 MG tablet Take 1 tablet (40 mg total) by mouth daily. 90 tablet 1   No current facility-administered medications for this visit.    Family History  Family history unknown: Yes    Social History   Socioeconomic History   Marital status: Single    Spouse name: Not on file   Number of children: Not on file   Years of education: Not on file   Highest education level: Not on file  Occupational History   Occupation: Architect  Tobacco Use   Smoking status: Every Day    Packs/day: 0.25    Types: Cigarettes, Cigars   Smokeless tobacco: Never  Vaping Use   Vaping Use: Never used  Substance and Sexual Activity   Alcohol use: Yes    Alcohol/week: 24.0 standard drinks of alcohol    Types: 4 Cans of beer, 20 Standard drinks or equivalent per week    Comment: Approx 20-25 beers week   Drug use: Not Currently    Frequency: 1.0 times per week    Types: Cocaine, Marijuana    Comment: smoke crack   Sexual activity: Not Currently  Other Topics Concern   Not on file  Social History Narrative   ** Merged History Encounter **       Social Determinants of Health   Financial Resource Strain: Not on file  Food Insecurity: Not on file  Transportation Needs: Unmet Transportation Needs (02/13/2022)   PRAPARE - Hydrologist (Medical): Yes    Lack of Transportation (Non-Medical): Yes  Physical Activity: Not on file  Stress: Not on file  Social Connections: Not on file  Intimate Partner Violence: Not on file     REVIEW OF SYSTEMS:   [X]  denotes positive finding, [ ]  denotes negative finding Cardiac  Comments:  Chest pain or chest pressure:    Shortness of breath upon exertion:    Short of breath when lying flat:    Irregular heart rhythm:        Vascular    Pain in calf, thigh, or hip brought on by ambulation: x   Pain in feet at night that wakes you up from your sleep:   x   Blood clot in your veins:    Leg swelling:         Pulmonary    Oxygen at home:    Productive cough:     Wheezing:         Neurologic    Sudden weakness in arms or legs:     Sudden numbness in arms or legs:  Sudden onset of difficulty speaking or slurred speech:    Temporary loss of vision in one eye:     Problems with dizziness:         Gastrointestinal    Blood in stool:     Vomited blood:         Genitourinary    Burning when urinating:     Blood in urine:        Psychiatric    Major depression:         Hematologic    Bleeding problems:    Problems with blood clotting too easily:        Skin    Rashes or ulcers:        Constitutional    Fever or chills:      PHYSICAL EXAMINATION:  Today's Vitals   09/16/22 1309  BP: 124/76  Pulse: 88  Temp: 98.6 F (37 C)  TempSrc: Temporal  SpO2: 99%  Height: '5\' 6"'$  (1.676 m)  PainSc: 8    Body mass index is 33.28 kg/m.   General:  WDWN in NAD; vital signs documented above Gait: slow; limping HENT: WNL, normocephalic Pulmonary: normal non-labored breathing , without wheezing Cardiac: regular HR, without carotid bruits Abdomen: soft, NT; aortic pulse is not palpable Skin: without rashes Vascular Exam/Pulses: Bilateral radial and femoral pulses are palpable Brisk doppler flow right PT/peroneal Dampened monophasic flow left PT Extremities: darkened skin left foot with great toe wound; there is no drainage from the toe wound.      Musculoskeletal: no muscle wasting or atrophy  Neurologic: A&O X 3 Psychiatric:  The pt has Normal affect.   Non-Invasive Vascular Imaging:   ABI's/TBI's on 09/16/2022: Right:  0.93/unable to obtain - Great toe pressure: unable to obtain Left:  0.16/absent - Great toe pressure: absent  Arterial duplex on 09/16/2022: +----------+--------+-----+--------+---------+--------+  LEFT     PSV cm/sRatioStenosisWaveform Comments   +----------+--------+-----+--------+---------+--------+  CFA Prox  118                  triphasic          +----------+--------+-----+--------+---------+--------+  DFA      140                  biphasic           +----------+--------+-----+--------+---------+--------+  SFA Prox  77                   triphasic          +----------+--------+-----+--------+---------+--------+  SFA Mid   83                   triphasic          +----------+--------+-----+--------+---------+--------+  SFA Distal118                  biphasic           +----------+--------+-----+--------+---------+--------+   Left Graft #1: +--------------------+--------+--------+--------+--------+                      PSV cm/sStenosisWaveformComments  +--------------------+--------+--------+--------+--------+  Inflow             21                                +--------------------+--------+--------+--------+--------+  Proximal Anastomosis        occluded                  +--------------------+--------+--------+--------+--------+  Proximal Graft              occluded                  +--------------------+--------+--------+--------+--------+  Mid Graft                   occluded                  +--------------------+--------+--------+--------+--------+  Distal Graft                occluded                  +--------------------+--------+--------+--------+--------+  Distal Anastomosis          occluded                  +--------------------+--------+--------+--------+--------+  Outflow            39                                +--------------------+--------+--------+--------+--------+   Summary:  Left: Occluded above-knee to posterior tibial artery bypass graft.   Previous ABI's/TBI's on 12/03/2021: Right:  0.97/0.30 - Great toe pressure: 43 Left:  1.11/0.62 - Great toe pressure:  89  Previous arterial duplex on 12/03/2021: Summary:   Left: Patent left above-knee popliteal to distal posterior tibial artery bypass graft with no evidence of restenosis.     ASSESSMENT/PLAN:: 57 y.o. male here for follow up for PAD with hx of harvest of left leg great saphenous vein and Left above-knee popliteal artery to posterior tibial artery bypass at the ankle with reversed ipsilateral great saphenous vein By Dr. [name] on 01/14/21. He tolerated the surgery well and did well post operatively. He was discharge POD# 4. He presents for follow-up with non-invasive studies.  In April 2023, he had a duplex that revealed elevated velocities at the anastomosis of the bypass above the knee.  He underwent arteriogram LLE on 10/24/2021 with angioplasty of proximal and mid vein bypass, angioplasty of native left SFA by Dr. [name].   Critical limb ischemia -pt duplex today reveals his bypass graft is occluded and his ABI on the left is 0.1.  he has a left great toe wound present that has been there for a week or two and is not healing and painful.  His motor and sensory are in tact in the left foot.  Discussed that he would need angiogram and offered 3/14 with Dr. [name], but he tells me this is not enough time to get transportation as they need 3-4 day notice.  Pt seen by Dr. [name] and we will schedule him for 3/21 with Dr. [name].  Discussed for him to protect his foot.  Also discussed with him not to let anyone operate on his foot until we get his blood flow improved b/c as of now, he would not heal this.  He is going to cancel his podiatry appt until after blood flow is re-established.  Discussed with pt that he is at risk of limb loss. -ok to continue foot soaks.   -no evidence of infection currently -continue asa/statin  Current smoker -discussed importance of smoking cessation.  He has cut back the amount he smokes since his last visit.  Stressed for him to continue on working on quitting.    Leontine Locket, [facility] [phone]  Clinic MD:   [name]

## 2022-09-19 ENCOUNTER — Ambulatory Visit (INDEPENDENT_AMBULATORY_CARE_PROVIDER_SITE_OTHER): Payer: Medicaid Other | Admitting: Nurse Practitioner

## 2022-09-19 ENCOUNTER — Other Ambulatory Visit (HOSPITAL_COMMUNITY): Payer: Self-pay

## 2022-09-19 ENCOUNTER — Other Ambulatory Visit: Payer: Self-pay

## 2022-09-19 ENCOUNTER — Encounter: Payer: Self-pay | Admitting: Nurse Practitioner

## 2022-09-19 VITALS — BP 128/72 | HR 85 | Temp 97.2°F | Ht 66.0 in | Wt 206.0 lb

## 2022-09-19 DIAGNOSIS — Z13220 Encounter for screening for lipoid disorders: Secondary | ICD-10-CM

## 2022-09-19 DIAGNOSIS — E118 Type 2 diabetes mellitus with unspecified complications: Secondary | ICD-10-CM | POA: Diagnosis not present

## 2022-09-19 DIAGNOSIS — G621 Alcoholic polyneuropathy: Secondary | ICD-10-CM | POA: Diagnosis not present

## 2022-09-19 DIAGNOSIS — D509 Iron deficiency anemia, unspecified: Secondary | ICD-10-CM | POA: Diagnosis not present

## 2022-09-19 DIAGNOSIS — K219 Gastro-esophageal reflux disease without esophagitis: Secondary | ICD-10-CM

## 2022-09-19 LAB — POCT GLYCOSYLATED HEMOGLOBIN (HGB A1C): Hemoglobin A1C: 5.7 % — AB (ref 4.0–5.6)

## 2022-09-19 MED ORDER — FERROUS SULFATE 325 (65 FE) MG PO TABS
325.0000 mg | ORAL_TABLET | Freq: Every day | ORAL | 0 refills | Status: DC
  Filled 2022-09-19: qty 30, 30d supply, fill #0
  Filled 2022-10-19: qty 30, 30d supply, fill #1
  Filled 2022-11-10: qty 30, 30d supply, fill #2
  Filled 2022-11-10: qty 30, 30d supply, fill #1
  Filled 2022-11-13 – 2022-12-05 (×3): qty 30, 30d supply, fill #2
  Filled 2022-12-30: qty 30, 30d supply, fill #3

## 2022-09-19 MED ORDER — ATORVASTATIN CALCIUM 40 MG PO TABS
40.0000 mg | ORAL_TABLET | Freq: Every day | ORAL | 1 refills | Status: DC
  Filled 2022-09-19 – 2022-12-24 (×7): qty 90, 90d supply, fill #0
  Filled 2022-12-30 – 2023-03-12 (×4): qty 90, 90d supply, fill #1

## 2022-09-19 MED ORDER — PANTOPRAZOLE SODIUM 40 MG PO TBEC
40.0000 mg | DELAYED_RELEASE_TABLET | Freq: Every day | ORAL | 1 refills | Status: DC
  Filled 2022-10-19 – 2022-11-10 (×2): qty 90, 90d supply, fill #0
  Filled 2022-11-10 – 2023-01-23 (×8): qty 90, 90d supply, fill #1

## 2022-09-19 MED ORDER — METFORMIN HCL 500 MG PO TABS
500.0000 mg | ORAL_TABLET | Freq: Every day | ORAL | 1 refills | Status: DC
  Filled 2022-09-19: qty 90, 90d supply, fill #0
  Filled 2022-11-10 – 2022-12-02 (×3): qty 90, 90d supply, fill #1

## 2022-09-19 MED ORDER — GABAPENTIN 300 MG PO CAPS
300.0000 mg | ORAL_CAPSULE | Freq: Two times a day (BID) | ORAL | 1 refills | Status: DC
  Filled 2022-09-19: qty 180, 90d supply, fill #0
  Filled 2022-11-10 – 2022-12-24 (×5): qty 180, 90d supply, fill #1

## 2022-09-19 MED ORDER — CLOPIDOGREL BISULFATE 75 MG PO TABS
75.0000 mg | ORAL_TABLET | Freq: Every day | ORAL | 11 refills | Status: DC
  Filled 2022-09-19: qty 30, 30d supply, fill #0
  Filled 2022-10-19: qty 30, 30d supply, fill #1
  Filled 2022-11-10: qty 30, 30d supply, fill #2
  Filled 2022-11-10: qty 30, 30d supply, fill #1
  Filled 2022-11-13 – 2022-12-05 (×3): qty 30, 30d supply, fill #2
  Filled 2022-12-30: qty 30, 30d supply, fill #3
  Filled 2023-02-03: qty 30, 30d supply, fill #4
  Filled 2023-02-20 – 2023-03-02 (×2): qty 30, 30d supply, fill #5
  Filled 2023-04-15: qty 30, 30d supply, fill #6
  Filled 2023-05-01 – 2023-05-18 (×3): qty 30, 30d supply, fill #7
  Filled 2023-06-15: qty 30, 30d supply, fill #8

## 2022-09-19 MED ORDER — CITALOPRAM HYDROBROMIDE 20 MG PO TABS
20.0000 mg | ORAL_TABLET | Freq: Every day | ORAL | 3 refills | Status: DC
  Filled 2022-09-19 – 2022-11-10 (×3): qty 30, 30d supply, fill #0
  Filled 2022-11-10 – 2022-12-05 (×4): qty 30, 30d supply, fill #1
  Filled 2022-12-30 – 2023-01-01 (×2): qty 30, 30d supply, fill #2
  Filled 2023-01-04 – 2023-02-03 (×3): qty 30, 30d supply, fill #3

## 2022-09-19 MED ORDER — NICOTINE 21 MG/24HR TD PT24
21.0000 mg | MEDICATED_PATCH | Freq: Every day | TRANSDERMAL | 0 refills | Status: DC
  Filled 2022-09-19: qty 28, 28d supply, fill #0

## 2022-09-19 NOTE — Assessment & Plan Note (Signed)
-   POCT glycosylated hemoglobin (Hb A1C) - Microalbumin/Creatinine Ratio, Urine - Ambulatory referral to Ophthalmology - metFORMIN (GLUCOPHAGE) 500 MG tablet; Take 1 tablet (500 mg total) by mouth daily with breakfast.  Dispense: 90 tablet; Refill: 1  2. Microcytic anemia  - ferrous sulfate 325 (65 FE) MG tablet; Take 1 tablet (325 mg total) by mouth daily with breakfast.  Dispense: 120 tablet; Refill: 0  3. Neuropathy, alcoholic (HCC)  - gabapentin (NEURONTIN) 300 MG capsule; Take 1 capsule (300 mg total) by mouth 2 (two) times daily.  Dispense: 180 capsule; Refill: 1  4. Gastroesophageal reflux disease, unspecified whether esophagitis present  - pantoprazole (PROTONIX) 40 MG tablet; Take 1 tablet (40 mg total) by mouth daily.  Dispense: 90 tablet; Refill: 1  Follow up:  Follow up in 3 months

## 2022-09-19 NOTE — Progress Notes (Signed)
@Patient  ID: Tony Hicks, male    DOB: 09-24-1965, 57 y.o.   MRN: [id number]  Chief Complaint  Patient presents with   Diabetes    Follow up    Referring provider: Fenton Foy, NP   HPI  Tony Hicks 57 y.o. male  has a past medical history of Diabetes mellitus without complication ([place]) and Syphilis. To the [facility] for reevaluation of chronic illness.      Diabetes Mellitus: Patient presents for follow up of diabetes. Symptoms: hyperglycemia and paresthesia of the feet.  Patient denies none.  Evaluation to date has been included: hemoglobin A1C.  Home sugars: patient does not check sugars. Treatment to date: no recent interventions.   A1C today 5.7   Denies any other concerns today. Denies any fatigue, chest pain, shortness of breath, HA or dizziness. Denies any blurred vision, numbness or tingling.     No Known Allergies  Immunization History  Administered Date(s) Administered   Influenza,inj,Quad PF,6+ Mos 03/14/2021   Moderna Sars-Covid-2 Vaccination 05/14/2020, 07/17/2020   Pneumococcal Polysaccharide-23 03/14/2021    Past Medical History:  Diagnosis Date   Diabetes mellitus without complication ([place])    Syphilis    Treated  02/14/2019    Tobacco History: Social History   Tobacco Use  Smoking Status Every Day   Packs/day: .25   Types: Cigarettes, Cigars  Smokeless Tobacco Never   Ready to quit: Not Answered Counseling given: Not Answered   Outpatient Encounter Medications as of 09/19/2022  Medication Sig   aspirin EC 81 MG tablet Take 1 tablet (81 mg total) by mouth daily. Swallow whole.   [DISCONTINUED] atorvastatin (LIPITOR) 40 MG tablet Take 1 tablet (40 mg total) by mouth daily.   [DISCONTINUED] citalopram (CELEXA) 20 MG tablet Take 1 tablet (20 mg total) by mouth daily.   [DISCONTINUED] clopidogrel (PLAVIX) 75 MG tablet Take 1 tablet (75 mg total) by mouth daily.   [DISCONTINUED] ferrous sulfate 325 (65 FE) MG tablet Take 1 tablet (325 mg  total) by mouth daily with breakfast.   [DISCONTINUED] gabapentin (NEURONTIN) 300 MG capsule Take 1 capsule (300 mg total) by mouth 2 (two) times daily.   [DISCONTINUED] metFORMIN (GLUCOPHAGE) 500 MG tablet Take 1 tablet (500 mg total) by mouth daily with breakfast.   [DISCONTINUED] nicotine (NICODERM CQ) 21 mg/24hr patch Place 1 patch (21 mg total) onto the skin daily.   [DISCONTINUED] pantoprazole (PROTONIX) 40 MG tablet Take 1 tablet (40 mg total) by mouth daily.   atorvastatin (LIPITOR) 40 MG tablet Take 1 tablet (40 mg total) by mouth daily.   citalopram (CELEXA) 20 MG tablet Take 1 tablet (20 mg total) by mouth daily.   clopidogrel (PLAVIX) 75 MG tablet Take 1 tablet (75 mg total) by mouth daily.   ferrous sulfate 325 (65 FE) MG tablet Take 1 tablet (325 mg total) by mouth daily with breakfast.   gabapentin (NEURONTIN) 300 MG capsule Take 1 capsule (300 mg total) by mouth 2 (two) times daily.   metFORMIN (GLUCOPHAGE) 500 MG tablet Take 1 tablet (500 mg total) by mouth daily with breakfast.   nicotine (NICODERM CQ) 21 mg/24hr patch Place 1 patch (21 mg total) onto the skin daily.   pantoprazole (PROTONIX) 40 MG tablet Take 1 tablet (40 mg total) by mouth daily.   No facility-administered encounter medications on file as of 09/19/2022.     Review of Systems  Review of Systems  Constitutional: Negative.   HENT: Negative.    Cardiovascular: Negative.  Gastrointestinal: Negative.   Allergic/Immunologic: Negative.   Neurological: Negative.   Psychiatric/Behavioral: Negative.         Physical Exam  BP 128/72   Pulse 85   Temp (!) 97.2 F (36.2 C)   Ht 5\' 6"  (1.676 m)   Wt 206 lb (93.4 kg)   SpO2 100%   BMI 33.25 kg/m   Wt Readings from Last 5 Encounters:  09/19/22 206 lb (93.4 kg)  02/13/22 206 lb 3.2 oz (93.5 kg)  12/03/21 202 lb (91.6 kg)  10/24/21 207 lb (93.9 kg)  10/18/21 207 lb 9.6 oz (94.2 kg)     Physical Exam Vitals and nursing note reviewed.   Constitutional:      General: He is not in acute distress.    Appearance: He is well-developed.  Cardiovascular:     Rate and Rhythm: Normal rate and regular rhythm.  Pulmonary:     Effort: Pulmonary effort is normal.     Breath sounds: Normal breath sounds.  Skin:    General: Skin is warm and dry.  Neurological:     Mental Status: He is alert and oriented to person, place, and time.      Lab Results:  CBC    Component Value Date/Time   WBC 3.7 02/13/2022 1149   WBC 6.1 01/22/2021 2118   RBC 4.73 02/13/2022 1149   RBC 4.15 (L) 01/22/2021 2118   HGB 15.0 02/13/2022 1149   HCT 42.7 02/13/2022 1149   PLT 179 02/13/2022 1149   MCV 90 02/13/2022 1149   MCH 31.7 02/13/2022 1149   MCH 21.2 (L) 01/22/2021 2118   MCHC 35.1 02/13/2022 1149   MCHC 30.2 01/22/2021 2118   RDW 12.5 02/13/2022 1149   LYMPHSABS 1.7 03/14/2021 1204   MONOABS 0.5 01/22/2021 2118   EOSABS 0.1 03/14/2021 1204   BASOSABS 0.0 03/14/2021 1204    BMET    Component Value Date/Time   NA 140 02/13/2022 1149   K 5.0 02/13/2022 1149   CL 103 02/13/2022 1149   CO2 23 02/13/2022 1149   GLUCOSE 81 02/13/2022 1149   GLUCOSE 96 10/24/2021 0817   BUN 10 02/13/2022 1149   CREATININE 0.89 02/13/2022 1149   CALCIUM 9.2 02/13/2022 1149   GFRNONAA >60 01/22/2021 2118   GFRAA 101 02/17/2019 1543    BNP No results found for: "BNP"  ProBNP No results found for: "PROBNP"  Imaging: VAS [place] LOWER EXTREMITY ARTERIAL DUPLEX  Result Date: 09/16/2022 LOWER EXTREMITY ARTERIAL DUPLEX STUDY Patient Name:  Tony Hicks  Date of Exam:   09/16/2022 Medical Rec #: [id number]       Accession #:    [id number] Date of Birth: June 15, 1966       Patient Gender: M Patient Age:   7 years Exam Location:  [facility] Procedure:      VAS [place] LOWER EXTREMITY ARTERIAL DUPLEX Referring Phys: Monica Martinez --------------------------------------------------------------------------------  Indications: Peripheral artery  disease, and Critical limb ischemia of left lower              extremity with bypass graft. High Risk Factors: Diabetes.  Vascular Interventions: 10/24/2021: Angioplasty of proximal and mid vein bypass                         of the left lower extremity. Angioplasty of native left  SFA.                          01/14/2021: Left above-knee popliteal to posterior                         tibial artery bypass graft. Current ABI:            RT 0.93 Lt 0.16 Limitations: involuntary movement Performing Technologist: Archie Patten RVS  Examination Guidelines: A complete evaluation includes B-mode imaging, spectral Doppler, color Doppler, and power Doppler as needed of all accessible portions of each vessel. Bilateral testing is considered an integral part of a complete examination. Limited examinations for reoccurring indications may be performed as noted.   +----------+--------+-----+--------+---------+--------+ LEFT      PSV cm/sRatioStenosisWaveform Comments +----------+--------+-----+--------+---------+--------+ CFA Prox  118                  triphasic         +----------+--------+-----+--------+---------+--------+ DFA       140                  biphasic          +----------+--------+-----+--------+---------+--------+ SFA Prox  77                   triphasic         +----------+--------+-----+--------+---------+--------+ SFA Mid   83                   triphasic         +----------+--------+-----+--------+---------+--------+ SFA [id number]                  biphasic          +----------+--------+-----+--------+---------+--------+  Left Graft #1: +--------------------+--------+--------+--------+--------+                     PSV cm/sStenosisWaveformComments +--------------------+--------+--------+--------+--------+ Inflow              21                               +--------------------+--------+--------+--------+--------+ Proximal Anastomosis         occluded                 +--------------------+--------+--------+--------+--------+ Proximal Graft              occluded                 +--------------------+--------+--------+--------+--------+ Mid Graft                   occluded                 +--------------------+--------+--------+--------+--------+ Distal Graft                occluded                 +--------------------+--------+--------+--------+--------+ Distal Anastomosis          occluded                 +--------------------+--------+--------+--------+--------+ Outflow             39                               +--------------------+--------+--------+--------+--------+   Summary: Left: Occluded above-knee to posterior tibial artery bypass graft.  See table(s) above for measurements and observations. Electronically signed by Monica Martinez MD on 09/16/2022 at 1:34:19 PM.    Final    VAS [place] ABI WITH/WO TBI  Result Date: 09/16/2022  LOWER EXTREMITY DOPPLER STUDY Patient Name:  Tony Hicks  Date of Exam:   09/16/2022 Medical Rec #: [id number]       Accession #:    [id number] Date of Birth: 12/11/1965       Patient Gender: M Patient Age:   89 years Exam Location:  [facility] Procedure:      VAS [place] ABI WITH/WO TBI Referring Phys: Monica Martinez --------------------------------------------------------------------------------  Indications: Peripheral artery disease. Critical limb ischemia of left lower              extremity with bypass graft High Risk Factors: Diabetes.  Vascular Interventions: 10/24/2021: Angioplasty of proximal and mid vein bypass                         of the left lower extremity. Angioplasty of native left                         SFA.                          01/14/2021: Left above-knee popliteal to posterior                         tibial artery bypass graft. Limitations: Today's exam was limited due to involuntary patient movement. Comparison Study: prior RT 0.97  Lt 1.11 Performing Technologist: Archie Patten RVS  Examination Guidelines: A complete evaluation includes at minimum, Doppler waveform signals and systolic blood pressure reading at the level of bilateral brachial, anterior tibial, and posterior tibial arteries, when vessel segments are accessible. Bilateral testing is considered an integral part of a complete examination. Photoelectric Plethysmograph (PPG) waveforms and toe systolic pressure readings are included as required and additional duplex testing as needed. Limited examinations for reoccurring indications may be performed as noted.  ABI Findings: +---------+------------------+-----+----------+--------------------------------+ Right    Rt Pressure (mmHg)IndexWaveform  Comment                          +---------+------------------+-----+----------+--------------------------------+ Brachial 129                                                               +---------+------------------+-----+----------+--------------------------------+ PTA      120               0.93 monophasic                                 +---------+------------------+-----+----------+--------------------------------+ DP       75                0.58 monophasic                                 +---------+------------------+-----+----------+--------------------------------+ Great Toe  unable to obtain toe pressure                                              due to patient movement          +---------+------------------+-----+----------+--------------------------------+ +---------+------------------+-----+-------------------+-----------+ Left     Lt Pressure (mmHg)IndexWaveform           Comment     +---------+------------------+-----+-------------------+-----------+ Brachial 125                                                   +---------+------------------+-----+-------------------+-----------+ PTA      21                 0.16 dampened monophasic            +---------+------------------+-----+-------------------+-----------+ DP                                                 not audible +---------+------------------+-----+-------------------+-----------+ Great Toe                       Absent                         +---------+------------------+-----+-------------------+-----------+ +-------+-----------+-----------+------------+------------+ ABI/TBIToday's ABIToday's TBIPrevious ABIPrevious TBI +-------+-----------+-----------+------------+------------+ Right  0.93                                           +-------+-----------+-----------+------------+------------+ Left   0.16                                           +-------+-----------+-----------+------------+------------+ Right ABIs appear decreased. Left ABIs appear decreased.  Summary: Right: Resting right ankle-brachial index indicates mild right lower extremity arterial disease. Left: Resting left ankle-brachial index indicates critical left limb ischemia. *See table(s) above for measurements and observations.  Electronically signed by Monica Martinez MD on 09/16/2022 at 1:33:27 PM.    Final      Assessment & Plan:   Diabetes mellitus type 2, controlled ([place]) - POCT glycosylated hemoglobin (Hb A1C) - Microalbumin/Creatinine Ratio, Urine - Ambulatory referral to Ophthalmology - metFORMIN (GLUCOPHAGE) 500 MG tablet; Take 1 tablet (500 mg total) by mouth daily with breakfast.  Dispense: 90 tablet; Refill: 1  2. Microcytic anemia  - ferrous sulfate 325 (65 FE) MG tablet; Take 1 tablet (325 mg total) by mouth daily with breakfast.  Dispense: 120 tablet; Refill: 0  3. Neuropathy, alcoholic (HCC)  - gabapentin (NEURONTIN) 300 MG capsule; Take 1 capsule (300 mg total) by mouth 2 (two) times daily.  Dispense: 180 capsule; Refill: 1  4. Gastroesophageal reflux disease, unspecified whether esophagitis present  -  pantoprazole (PROTONIX) 40 MG tablet; Take 1 tablet (40 mg total) by mouth daily.  Dispense: 90 tablet; Refill: 1  Follow up:  Follow up in 3 months     Fenton Foy, NP  09/19/2022  

## 2022-09-19 NOTE — Patient Instructions (Addendum)
1. Controlled type 2 diabetes mellitus with complication, without long-term current use of insulin (HCC)  - POCT glycosylated hemoglobin (Hb A1C) - Microalbumin/Creatinine Ratio, Urine - Ambulatory referral to Ophthalmology - metFORMIN (GLUCOPHAGE) 500 MG tablet; Take 1 tablet (500 mg total) by mouth daily with breakfast.  Dispense: 90 tablet; Refill: 1  2. Microcytic anemia  - ferrous sulfate 325 (65 FE) MG tablet; Take 1 tablet (325 mg total) by mouth daily with breakfast.  Dispense: 120 tablet; Refill: 0  3. Neuropathy, alcoholic (HCC)  - gabapentin (NEURONTIN) 300 MG capsule; Take 1 capsule (300 mg total) by mouth 2 (two) times daily.  Dispense: 180 capsule; Refill: 1  4. Gastroesophageal reflux disease, unspecified whether esophagitis present  - pantoprazole (PROTONIX) 40 MG tablet; Take 1 tablet (40 mg total) by mouth daily.  Dispense: 90 tablet; Refill: 1  Follow up:  Follow up in 3 months

## 2022-09-20 LAB — CBC
Hematocrit: 42.1 % (ref 37.5–51.0)
Hemoglobin: 14.7 g/dL (ref 13.0–17.7)
MCH: 32 pg (ref 26.6–33.0)
MCHC: 34.9 g/dL (ref 31.5–35.7)
MCV: 92 fL (ref 79–97)
Platelets: 231 10*3/uL (ref 150–450)
RBC: 4.6 x10E6/uL (ref 4.14–5.80)
RDW: 12.8 % (ref 11.6–15.4)
WBC: 5 10*3/uL (ref 3.4–10.8)

## 2022-09-20 LAB — COMPREHENSIVE METABOLIC PANEL
ALT: 16 IU/L (ref 0–44)
AST: 16 IU/L (ref 0–40)
Albumin/Globulin Ratio: 2.1 (ref 1.2–2.2)
Albumin: 4.5 g/dL (ref 3.8–4.9)
Alkaline Phosphatase: 72 IU/L (ref 44–121)
BUN/Creatinine Ratio: 13 (ref 9–20)
BUN: 11 mg/dL (ref 6–24)
Bilirubin Total: 0.3 mg/dL (ref 0.0–1.2)
CO2: 23 mmol/L (ref 20–29)
Calcium: 9.6 mg/dL (ref 8.7–10.2)
Chloride: 102 mmol/L (ref 96–106)
Creatinine, Ser: 0.86 mg/dL (ref 0.76–1.27)
Globulin, Total: 2.1 g/dL (ref 1.5–4.5)
Glucose: 81 mg/dL (ref 70–99)
Potassium: 4.6 mmol/L (ref 3.5–5.2)
Sodium: 144 mmol/L (ref 134–144)
Total Protein: 6.6 g/dL (ref 6.0–8.5)
eGFR: 102 mL/min/{1.73_m2} (ref >59)

## 2022-09-20 LAB — LIPID PANEL
Chol/HDL Ratio: 2.3 ratio (ref 0.0–5.0)
Cholesterol, Total: 185 mg/dL (ref 100–199)
HDL: 81 mg/dL (ref >39)
LDL Chol Calc (NIH): 61 mg/dL (ref 0–99)
Triglycerides: 277 mg/dL — ABNORMAL HIGH (ref 0–149)
VLDL Cholesterol Cal: 43 mg/dL — ABNORMAL HIGH (ref 5–40)

## 2022-09-22 ENCOUNTER — Other Ambulatory Visit: Payer: Self-pay | Admitting: Nurse Practitioner

## 2022-09-22 ENCOUNTER — Other Ambulatory Visit: Payer: Self-pay

## 2022-09-22 LAB — MICROALBUMIN / CREATININE URINE RATIO
Creatinine, Urine: 198.9 mg/dL
Microalb/Creat Ratio: 10 mg/g creat (ref 0–29)
Microalbumin, Urine: 20.1 ug/mL

## 2022-09-22 MED ORDER — OMEGA-3-ACID ETHYL ESTERS 1 G PO CAPS
1.0000 g | ORAL_CAPSULE | Freq: Two times a day (BID) | ORAL | 2 refills | Status: DC
  Filled 2022-09-22: qty 60, 30d supply, fill #0
  Filled 2022-10-19 – 2022-11-10 (×2): qty 60, 30d supply, fill #1
  Filled 2022-11-10 – 2022-12-05 (×4): qty 60, 30d supply, fill #2

## 2022-09-24 ENCOUNTER — Ambulatory Visit: Payer: Medicaid Other | Admitting: Podiatry

## 2022-09-24 ENCOUNTER — Other Ambulatory Visit: Payer: Self-pay

## 2022-09-25 ENCOUNTER — Encounter (HOSPITAL_COMMUNITY)
Admission: RE | Disposition: A | Discharge status: Home / Self Care | Payer: Self-pay | Source: Home / Self Care | Attending: Vascular Surgery

## 2022-09-25 ENCOUNTER — Other Ambulatory Visit: Payer: Self-pay

## 2022-09-25 ENCOUNTER — Ambulatory Visit (HOSPITAL_BASED_OUTPATIENT_CLINIC_OR_DEPARTMENT_OTHER): Payer: Medicaid Other

## 2022-09-25 ENCOUNTER — Ambulatory Visit (HOSPITAL_COMMUNITY)
Admission: RE | Admit: 2022-09-25 | Discharge: 2022-09-25 | Disposition: A | Discharge status: Home / Self Care | Payer: Medicaid Other | Attending: Vascular Surgery | Admitting: Vascular Surgery

## 2022-09-25 DIAGNOSIS — I70322 Atherosclerosis of unspecified type of bypass graft(s) of the extremities with rest pain, left leg: Secondary | ICD-10-CM

## 2022-09-25 DIAGNOSIS — Z7982 Long term (current) use of aspirin: Secondary | ICD-10-CM | POA: Diagnosis not present

## 2022-09-25 DIAGNOSIS — L97529 Non-pressure chronic ulcer of other part of left foot with unspecified severity: Secondary | ICD-10-CM | POA: Insufficient documentation

## 2022-09-25 DIAGNOSIS — Z7984 Long term (current) use of oral hypoglycemic drugs: Secondary | ICD-10-CM | POA: Diagnosis not present

## 2022-09-25 DIAGNOSIS — I10 Essential (primary) hypertension: Secondary | ICD-10-CM | POA: Diagnosis not present

## 2022-09-25 DIAGNOSIS — Z79899 Other long term (current) drug therapy: Secondary | ICD-10-CM | POA: Diagnosis not present

## 2022-09-25 DIAGNOSIS — F17210 Nicotine dependence, cigarettes, uncomplicated: Secondary | ICD-10-CM | POA: Insufficient documentation

## 2022-09-25 DIAGNOSIS — E1151 Type 2 diabetes mellitus with diabetic peripheral angiopathy without gangrene: Secondary | ICD-10-CM | POA: Diagnosis present

## 2022-09-25 DIAGNOSIS — Z7902 Long term (current) use of antithrombotics/antiplatelets: Secondary | ICD-10-CM | POA: Diagnosis not present

## 2022-09-25 DIAGNOSIS — E11621 Type 2 diabetes mellitus with foot ulcer: Secondary | ICD-10-CM | POA: Diagnosis not present

## 2022-09-25 DIAGNOSIS — I70245 Atherosclerosis of native arteries of left leg with ulceration of other part of foot: Secondary | ICD-10-CM | POA: Diagnosis not present

## 2022-09-25 HISTORY — PX: ABDOMINAL AORTOGRAM W/LOWER EXTREMITY: CATH118223

## 2022-09-25 LAB — GLUCOSE, CAPILLARY: Glucose-Capillary: 115 mg/dL — ABNORMAL HIGH (ref 70–99)

## 2022-09-25 SURGERY — ABDOMINAL AORTOGRAM W/LOWER EXTREMITY
Anesthesia: LOCAL | Laterality: Bilateral

## 2022-09-25 MED ORDER — FENTANYL CITRATE (PF) 100 MCG/2ML IJ SOLN
INTRAMUSCULAR | Status: AC
  Filled 2022-09-25: qty 2

## 2022-09-25 MED ORDER — HEPARIN (PORCINE) IN NACL 1000-0.9 UT/500ML-% IV SOLN
INTRAVENOUS | Status: DC | PRN
  Administered 2022-09-25 (×2): 500 mL

## 2022-09-25 MED ORDER — MIDAZOLAM HCL 2 MG/2ML IJ SOLN
INTRAMUSCULAR | Status: AC
  Filled 2022-09-25: qty 2

## 2022-09-25 MED ORDER — HYDRALAZINE HCL 20 MG/ML IJ SOLN: 5.0000 mg | INTRAMUSCULAR | Status: DC | PRN

## 2022-09-25 MED ORDER — SODIUM CHLORIDE 0.9% FLUSH: 3.0000 mL | INTRAVENOUS | Status: DC | PRN

## 2022-09-25 MED ORDER — MIDAZOLAM HCL 2 MG/2ML IJ SOLN
INTRAMUSCULAR | Status: DC | PRN
  Administered 2022-09-25: 1 mg via INTRAVENOUS

## 2022-09-25 MED ORDER — SODIUM CHLORIDE 0.9 % IV SOLN: 250.0000 mL | INTRAVENOUS | Status: DC | PRN

## 2022-09-25 MED ORDER — LIDOCAINE HCL (PF) 1 % IJ SOLN
INTRAMUSCULAR | Status: AC
  Filled 2022-09-25: qty 30

## 2022-09-25 MED ORDER — SODIUM CHLORIDE 0.9 % IV SOLN: INTRAVENOUS | Status: DC

## 2022-09-25 MED ORDER — LIDOCAINE HCL (PF) 1 % IJ SOLN
INTRAMUSCULAR | Status: DC | PRN
  Administered 2022-09-25: 15 mL

## 2022-09-25 MED ORDER — ACETAMINOPHEN 325 MG PO TABS: 650.0000 mg | ORAL_TABLET | ORAL | Status: DC | PRN

## 2022-09-25 MED ORDER — ONDANSETRON HCL 4 MG/2ML IJ SOLN: 4.0000 mg | Freq: Four times a day (QID) | INTRAMUSCULAR | Status: DC | PRN

## 2022-09-25 MED ORDER — SODIUM CHLORIDE 0.9% FLUSH: 3.0000 mL | Freq: Two times a day (BID) | INTRAVENOUS | Status: DC

## 2022-09-25 MED ORDER — FENTANYL CITRATE (PF) 100 MCG/2ML IJ SOLN
INTRAMUSCULAR | Status: DC | PRN
  Administered 2022-09-25: 25 ug via INTRAVENOUS

## 2022-09-25 MED ORDER — LABETALOL HCL 5 MG/ML IV SOLN: 10.0000 mg | INTRAVENOUS | Status: DC | PRN

## 2022-09-25 MED ORDER — IODIXANOL 320 MG/ML IV SOLN
INTRAVENOUS | Status: DC | PRN
  Administered 2022-09-25: 140 mL

## 2022-09-25 SURGICAL SUPPLY — 12 items
CATH OMNI FLUSH 5F 65CM (CATHETERS) IMPLANT
DEVICE CLOSURE MYNXGRIP 5F (Vascular Products) IMPLANT
KIT MICROPUNCTURE NIT STIFF (SHEATH) IMPLANT
KIT PV (KITS) ×1 IMPLANT
SHEATH PINNACLE 5F 10CM (SHEATH) IMPLANT
SHEATH PROBE COVER 6X72 (BAG) IMPLANT
STOPCOCK MORSE 400PSI 3WAY (MISCELLANEOUS) IMPLANT
SYR MEDRAD MARK 7 150ML (SYRINGE) ×1 IMPLANT
TRANSDUCER W/STOPCOCK (MISCELLANEOUS) ×1 IMPLANT
TRAY PV CATH (CUSTOM PROCEDURE TRAY) ×1 IMPLANT
TUBING CIL FLEX 10 FLL-RA (TUBING) IMPLANT
WIRE BENTSON .035X145CM (WIRE) IMPLANT

## 2022-09-25 NOTE — Op Note (Signed)
Patient name: Tony Hicks MRN: [id number] DOB: [birth year] Sex: male  09/25/2022 Pre-operative Diagnosis: Critical limb ischemia of the left lower extremity with tissue loss with occluded above-knee popliteal artery to posterior tibial artery bypass Post-operative diagnosis:  Same Surgeon:  Marty Heck, MD Procedure Performed: 1.  Ultrasound-guided access right common femoral artery 2.  Aortogram with catheter selection of aorta 3.  Bilateral lower extremity arteriogram with selection of third-order branches in the left lower extremity 4.  Mynx closure of the right common femoral artery 5.  41 minutes of monitored moderate conscious sedation time  Indications: Patient is a 57 year old male that previously underwent a left above-knee popliteal artery to posterior tibial artery bypass for tissue loss on 01/14/2021.  This is known to be occluded after recent surveillance in the office.  He has had several months of symptoms in the left foot with a new tissue loss.  He presents today for angiogram with a focus on the left leg after risk benefits discussed.  Findings:   Aortogram showed both renal arteries widely patent with ectasia of the infrarenal aorta but no flow-limiting stenosis in the aortoiliac segment.  The left common femoral and profunda are widely patent and the proximal to mid SFA is widely patent without flow-limiting stenosis.  The distal SFA is heavily diseased and he has an occluded above-knee popliteal artery.  He reconstitutes the posterior tibial artery just above the ankle in the distal calf through collaterals.  The right common femoral and profunda are widely patent as well as the SFA above and below-knee popliteal artery.  Dominant runoff into the foot is through a diseased posterior tibial artery gives off a collateral at the ankle to the peroneal.    Procedure:  The patient was identified in the holding area and taken to room 7.  The patient was then placed  supine on the table and prepped and draped in the usual sterile fashion.  A time out was called.  The patient received Versed and fentanyl for conscious moderate sedation.  Vital signs were monitored including heart rate, respiratory, oxygenation and blood pressure.  I was present for all of moderate sedation.  Ultrasound was used to evaluate the right common femoral artery.  It was patent .  A digital ultrasound image was acquired.  A micropuncture needle was used to access the right common femoral artery under ultrasound guidance.  An 018 wire was advanced without resistance and a micropuncture sheath was placed.  The 018 wire was removed and a benson wire was placed.  The micropuncture sheath was exchanged for a 5 french sheath.  An omniflush catheter was advanced over the wire to the level of L-1.  An abdominal angiogram was obtained.  Next the catheter was pulled down and bilateral lower extremity runoff was obtained.  Pertinent findings are noted above.  In order to get better dedicated imaging on the left for target for redo bypass, I did cross the aortic bifurcation and got my Omni Flush catheter into the left SFA.  We got some additional dedicated imaging on the left including a lateral foot shot.  Ultimately patient will need redo bypass on the left.  Wires and catheters were removed.  A minx closure device was deployed in the right groin.  Plan: Will get updated vein mapping.  Patient will be scheduled for redo left femoral to PT bypass.     Marty Heck, MD [facility] Office: [phone]

## 2022-09-25 NOTE — Progress Notes (Signed)
Pt ambulated to and from bathroom to void with no signs of oozing from Right groin site  

## 2022-09-25 NOTE — H&P (Signed)
History and Physical Interval Note:  09/25/2022 9:06 AM  Tony Hicks  has presented today for surgery, with the diagnosis of ischemia.  The various methods of treatment have been discussed with the patient and family. After consideration of risks, benefits and other options for treatment, the patient has consented to  Procedure(s): ABDOMINAL AORTOGRAM W/LOWER EXTREMITY (N/A) as a surgical intervention.  The patient's history has been reviewed, patient examined, no change in status, stable for surgery.  I have reviewed the patient's chart and labs.  Questions were answered to the patient's satisfaction.     Marty Heck   HISTORY AND PHYSICAL        CC:  follow up. Requesting Provider:  Fenton Foy, NP   HPI: This is a 57 y.o. male who is here today for follow up for PAD.  Pt has hx of harvest of left leg great saphenous vein and Left above-knee popliteal artery to posterior tibial artery bypass at the ankle with reversed ipsilateral great saphenous vein By Dr. [name] on 01/14/21. He tolerated the surgery well and did well post operatively. He was discharge POD# 4. He presents for follow-up with non-invasive studies.  In April 2023, he had a duplex that revealed elevated velocities at the anastomosis of the bypass above the knee.  He underwent arteriogram LLE on 10/24/2021 with angioplasty of proximal and mid vein bypass, angioplasty of native left SFA by Dr. [name].    Pt was last seen 12/03/2021 by Dr. [name] and at that time, his bypass was widely patent without evidence of restenosis.  He was encouraged to continue his asa and plavix.  He was scheduled for 6 month follow up.     The pt returns today for follow up.  He states that he has had left foot pain since his surgery.  He bumped his left great toe about 1-2 weeks ago and it is painful.  He states that his left heel is also painful.  He states he has pain in the foot at night.  He states he has trouble getting up at night and  does have some crutches he can use.   He cannot really say that his foot pain has gotten worse and has not significantly changed.  He does not have any issues with his right foot.  He has been compliant with his asa and Lipitor but has not been taking his Plavix.  He continues to smoke but has cut back.  He states he soaks his foot at night and uses a grease to moisturize his skin as lotion doesn't work.     The pt is on a statin for cholesterol management.    The pt is on an aspirin.    Other AC:  Plavix The pt is not on medication for hypertension.  The pt does have diabetes. Tobacco hx:  current         Past Medical History:  Diagnosis Date   Diabetes mellitus without complication ([place])     Syphilis      Treated  02/14/2019           Past Surgical History:  Procedure Laterality Date   ABDOMINAL AORTOGRAM W/LOWER EXTREMITY N/A 01/11/2021    Procedure: ABDOMINAL AORTOGRAM W/LOWER EXTREMITY;  Surgeon: Cherre Robins, MD;  Location: [facility];  Service: Cardiovascular;  Laterality: N/A;   ABDOMINAL AORTOGRAM W/LOWER EXTREMITY N/A 10/24/2021    Procedure: ABDOMINAL AORTOGRAM W/LOWER EXTREMITY;  Surgeon: Marty Heck, MD;  Location: [facility];  Service: Cardiovascular;  Laterality: N/A;   NO PAST SURGERIES       PERIPHERAL VASCULAR BALLOON ANGIOPLASTY   10/24/2021    Procedure: PERIPHERAL VASCULAR BALLOON ANGIOPLASTY;  Surgeon: Marty Heck, MD;  Location: [facility];  Service: Cardiovascular;;  Left SFA and Left Pop Bypass   THROMBECTOMY OF BYPASS GRAFT FEMORAL- TIBIAL ARTERY Left 01/14/2021    Procedure: LEFT ABOVE KNEE TO  POSTERIOR TIBIAL BYPASS GRAFT WITH GREATER SAPHENOUS VEIN;  Surgeon: Marty Heck, MD;  Location: [facility];  Service: Vascular;  Laterality: Left;      No Known Allergies         Current Outpatient Medications  Medication Sig Dispense Refill   aspirin EC 81 MG tablet Take 1 tablet (81 mg total) by mouth daily. Swallow  whole. 30 tablet 11   atorvastatin (LIPITOR) 40 MG tablet Take 1 tablet (40 mg total) by mouth daily. 90 tablet 1   citalopram (CELEXA) 20 MG tablet Take 1 tablet (20 mg total) by mouth daily. 30 tablet 3   clopidogrel (PLAVIX) 75 MG tablet Take 1 tablet (75 mg total) by mouth daily. 30 tablet 11   ferrous sulfate 325 (65 FE) MG tablet Take 1 tablet (325 mg total) by mouth daily with breakfast. 120 tablet 0   gabapentin (NEURONTIN) 300 MG capsule Take 1 capsule (300 mg total) by mouth 2 (two) times daily. 180 capsule 1   metFORMIN (GLUCOPHAGE) 500 MG tablet Take 1 tablet (500 mg total) by mouth daily with breakfast. 90 tablet 1   nicotine (NICODERM CQ) 21 mg/24hr patch Place 1 patch (21 mg total) onto the skin daily. 28 patch 0   pantoprazole (PROTONIX) 40 MG tablet Take 1 tablet (40 mg total) by mouth daily. 90 tablet 1    No current facility-administered medications for this visit.      Family History  Family history unknown: Yes      Social History         Socioeconomic History   Marital status: Single      Spouse name: Not on file   Number of children: Not on file   Years of education: Not on file   Highest education level: Not on file  Occupational History   Occupation: Architect  Tobacco Use   Smoking status: Every Day      Packs/day: 0.25      Types: Cigarettes, Cigars   Smokeless tobacco: Never  Vaping Use   Vaping Use: Never used  Substance and Sexual Activity   Alcohol use: Yes      Alcohol/week: 24.0 standard drinks of alcohol      Types: 4 Cans of beer, 20 Standard drinks or equivalent per week      Comment: Approx 20-25 beers week   Drug use: Not Currently      Frequency: 1.0 times per week      Types: Cocaine, Marijuana      Comment: smoke crack   Sexual activity: Not Currently  Other Topics Concern   Not on file  Social History Narrative    ** Merged History Encounter **         Social Determinants of Health        Financial Resource Strain: Not  on file  Food Insecurity: Not on file  Transportation Needs: Unmet Transportation Needs (02/13/2022)    PRAPARE - Armed forces logistics/support/administrative officer (Medical): Yes  Lack of Transportation (Non-Medical): Yes  Physical Activity: Not on file  Stress: Not on file  Social Connections: Not on file  Intimate Partner Violence: Not on file        REVIEW OF SYSTEMS:    [X]  denotes positive finding, [ ]  denotes negative finding Cardiac   Comments:  Chest pain or chest pressure:      Shortness of breath upon exertion:      Short of breath when lying flat:      Irregular heart rhythm:             Vascular      Pain in calf, thigh, or hip brought on by ambulation: x    Pain in feet at night that wakes you up from your sleep:  x    Blood clot in your veins:      Leg swelling:              Pulmonary      Oxygen at home:      Productive cough:       Wheezing:              Neurologic      Sudden weakness in arms or legs:       Sudden numbness in arms or legs:       Sudden onset of difficulty speaking or slurred speech:      Temporary loss of vision in one eye:       Problems with dizziness:              Gastrointestinal      Blood in stool:       Vomited blood:              Genitourinary      Burning when urinating:       Blood in urine:             Psychiatric      Major depression:              Hematologic      Bleeding problems:      Problems with blood clotting too easily:             Skin      Rashes or ulcers:             Constitutional      Fever or chills:          PHYSICAL EXAMINATION:      Today's Vitals    09/16/22 1309  BP: 124/76  Pulse: 88  Temp: 98.6 F (37 C)  TempSrc: Temporal  SpO2: 99%  Height: 5\' 6"  (1.676 m)  PainSc: 8     Body mass index is 33.28 kg/m.     General:  WDWN in NAD; vital signs documented above Gait: slow; limping HENT: WNL, normocephalic Pulmonary: normal non-labored breathing , without wheezing Cardiac:  regular HR, without carotid bruits Abdomen: soft, NT; aortic pulse is not palpable Skin: without rashes Vascular Exam/Pulses: Bilateral radial and femoral pulses are palpable Brisk doppler flow right PT/peroneal Dampened monophasic flow left PT Extremities: darkened skin left foot with great toe wound; there is no drainage from the toe wound.        Musculoskeletal: no muscle wasting or atrophy       Neurologic: A&O X 3 Psychiatric:  The pt has Normal affect.     Non-Invasive Vascular Imaging:   ABI's/TBI's on 09/16/2022: Right:  0.93/unable to  obtain - Great toe pressure: unable to obtain Left:  0.16/absent - Great toe pressure: absent   Arterial duplex on 09/16/2022: +----------+--------+-----+--------+---------+--------+  LEFT     PSV cm/sRatioStenosisWaveform Comments  +----------+--------+-----+--------+---------+--------+  CFA Prox  118                  triphasic          +----------+--------+-----+--------+---------+--------+  DFA      140                  biphasic           +----------+--------+-----+--------+---------+--------+  SFA Prox  77                   triphasic          +----------+--------+-----+--------+---------+--------+  SFA Mid   83                   triphasic          +----------+--------+-----+--------+---------+--------+  SFA Distal118                  biphasic           +----------+--------+-----+--------+---------+--------+   Left Graft #1: +--------------------+--------+--------+--------+--------+                      PSV cm/sStenosisWaveformComments  +--------------------+--------+--------+--------+--------+  Inflow             21                                +--------------------+--------+--------+--------+--------+  Proximal Anastomosis        occluded                  +--------------------+--------+--------+--------+--------+  Proximal Graft              occluded                   +--------------------+--------+--------+--------+--------+  Mid Graft                   occluded                  +--------------------+--------+--------+--------+--------+  Distal Graft                occluded                  +--------------------+--------+--------+--------+--------+  Distal Anastomosis          occluded                  +--------------------+--------+--------+--------+--------+  Outflow            39                                +--------------------+--------+--------+--------+--------+   Summary:  Left: Occluded above-knee to posterior tibial artery bypass graft.    Previous ABI's/TBI's on 12/03/2021: Right:  0.97/0.30 - Great toe pressure: 43 Left:  1.11/0.62 - Great toe pressure:  89   Previous arterial duplex on 12/03/2021: Summary:  Left: Patent left above-knee popliteal to distal posterior tibial artery bypass graft with no evidence of restenosis.        ASSESSMENT/PLAN:: 57 y.o. male here for follow up for PAD with hx of harvest of left leg great saphenous vein and Left above-knee popliteal artery to posterior tibial  artery bypass at the ankle with reversed ipsilateral great saphenous vein By Dr. [name] on 01/14/21. He tolerated the surgery well and did well post operatively. He was discharge POD# 4. He presents for follow-up with non-invasive studies.  In April 2023, he had a duplex that revealed elevated velocities at the anastomosis of the bypass above the knee.  He underwent arteriogram LLE on 10/24/2021 with angioplasty of proximal and mid vein bypass, angioplasty of native left SFA by Dr. [name].    Critical limb ischemia -pt duplex today reveals his bypass graft is occluded and his ABI on the left is 0.1.  he has a left great toe wound present that has been there for a week or two and is not healing and painful.  His motor and sensory are in tact in the left foot.  Discussed that he would need angiogram and offered 3/14 with Dr.  [name], but he tells me this is not enough time to get transportation as they need 3-4 day notice.  Pt seen by Dr. [name] and we will schedule him for 3/21 with Dr. [name].  Discussed for him to protect his foot.  Also discussed with him not to let anyone operate on his foot until we get his blood flow improved b/c as of now, he would not heal this.  He is going to cancel his podiatry appt until after blood flow is re-established.  Discussed with pt that he is at risk of limb loss. -ok to continue foot soaks.   -no evidence of infection currently -continue asa/statin   Current smoker -discussed importance of smoking cessation.  He has cut back the amount he smokes since his last visit.  Stressed for him to continue on working on quitting.     Leontine Locket, [facility] [phone]   Clinic MD:   [name]

## 2022-09-26 ENCOUNTER — Other Ambulatory Visit: Payer: Self-pay

## 2022-09-26 ENCOUNTER — Encounter (HOSPITAL_COMMUNITY): Payer: Self-pay | Admitting: Vascular Surgery

## 2022-09-26 DIAGNOSIS — I70322 Atherosclerosis of unspecified type of bypass graft(s) of the extremities with rest pain, left leg: Secondary | ICD-10-CM

## 2022-10-03 ENCOUNTER — Encounter (HOSPITAL_COMMUNITY): Payer: Self-pay | Admitting: Vascular Surgery

## 2022-10-03 ENCOUNTER — Other Ambulatory Visit: Payer: Self-pay

## 2022-10-03 NOTE — Progress Notes (Signed)
Internal Med - Lazaro Arms, NP Cardiologist - Dr Monica Martinez  Chest x-ray - n/a EKG - 09/25/22 Stress Test - n/a ECHO - n/a Cardiac Cath - n/a  ICD Pacemaker/Loop - n/a  Sleep Study -  n/a CPAP - none  Diabetes Type 2  Patient does not check his blood sugar  Do not take Metformin on the morning of surgery.  Blood Thinner Instructions:  Follow your surgeon's instructions on when to stop Plavix prior to surgery.  Last dose was on 10/03/22.  NPO  Anesthesia review: Yes  STOP now taking any Aspirin (unless otherwise instructed by your surgeon), Aleve, Naproxen, Ibuprofen, Motrin, Advil, Goody's, BC's, all herbal medications, fish oil, and all vitamins.   Coronavirus Screening Do you have any of the following symptoms:  Cough yes/no: No Fever (>100.69F)  yes/no: No Runny nose yes/no: No Sore throat yes/no: No Difficulty breathing/shortness of breath  yes/no: No  Have you traveled in the last 14 days and where? yes/no: No  Patient verbalized understanding of instructions that were given via phone.

## 2022-10-08 ENCOUNTER — Other Ambulatory Visit: Payer: Self-pay

## 2022-10-08 ENCOUNTER — Inpatient Hospital Stay (HOSPITAL_COMMUNITY)
Admission: RE | Admit: 2022-10-08 | Discharge: 2022-10-11 | DRG: 254 | Disposition: A | Discharge status: Home / Self Care | Payer: Medicaid Other | Attending: Vascular Surgery | Admitting: Vascular Surgery

## 2022-10-08 ENCOUNTER — Inpatient Hospital Stay (HOSPITAL_COMMUNITY): Payer: Medicaid Other | Admitting: Physician Assistant

## 2022-10-08 ENCOUNTER — Inpatient Hospital Stay (HOSPITAL_COMMUNITY): Payer: Medicaid Other

## 2022-10-08 ENCOUNTER — Encounter (HOSPITAL_COMMUNITY): Payer: Self-pay | Admitting: Vascular Surgery

## 2022-10-08 ENCOUNTER — Encounter (HOSPITAL_COMMUNITY)
Admission: RE | Disposition: A | Discharge status: Home / Self Care | Payer: Self-pay | Source: Home / Self Care | Attending: Vascular Surgery

## 2022-10-08 DIAGNOSIS — Z7902 Long term (current) use of antithrombotics/antiplatelets: Secondary | ICD-10-CM

## 2022-10-08 DIAGNOSIS — F17210 Nicotine dependence, cigarettes, uncomplicated: Secondary | ICD-10-CM | POA: Diagnosis present

## 2022-10-08 DIAGNOSIS — Z7982 Long term (current) use of aspirin: Secondary | ICD-10-CM | POA: Diagnosis not present

## 2022-10-08 DIAGNOSIS — E11621 Type 2 diabetes mellitus with foot ulcer: Secondary | ICD-10-CM | POA: Diagnosis present

## 2022-10-08 DIAGNOSIS — I70229 Atherosclerosis of native arteries of extremities with rest pain, unspecified extremity: Principal | ICD-10-CM | POA: Diagnosis present

## 2022-10-08 DIAGNOSIS — I70322 Atherosclerosis of unspecified type of bypass graft(s) of the extremities with rest pain, left leg: Secondary | ICD-10-CM

## 2022-10-08 DIAGNOSIS — Z79899 Other long term (current) drug therapy: Secondary | ICD-10-CM

## 2022-10-08 DIAGNOSIS — I70222 Atherosclerosis of native arteries of extremities with rest pain, left leg: Secondary | ICD-10-CM | POA: Diagnosis present

## 2022-10-08 DIAGNOSIS — E1149 Type 2 diabetes mellitus with other diabetic neurological complication: Secondary | ICD-10-CM | POA: Diagnosis not present

## 2022-10-08 DIAGNOSIS — E1151 Type 2 diabetes mellitus with diabetic peripheral angiopathy without gangrene: Secondary | ICD-10-CM | POA: Diagnosis present

## 2022-10-08 DIAGNOSIS — Z5982 Transportation insecurity: Secondary | ICD-10-CM

## 2022-10-08 DIAGNOSIS — T82868A Thrombosis of vascular prosthetic devices, implants and grafts, initial encounter: Secondary | ICD-10-CM | POA: Diagnosis not present

## 2022-10-08 DIAGNOSIS — Z7984 Long term (current) use of oral hypoglycemic drugs: Secondary | ICD-10-CM

## 2022-10-08 DIAGNOSIS — L97529 Non-pressure chronic ulcer of other part of left foot with unspecified severity: Secondary | ICD-10-CM | POA: Diagnosis present

## 2022-10-08 HISTORY — DX: Family history of other specified conditions: Z84.89

## 2022-10-08 HISTORY — DX: Hyperlipidemia, unspecified: E78.5

## 2022-10-08 HISTORY — PX: LOWER EXTREMITY ANGIOGRAM: SHX5508

## 2022-10-08 HISTORY — PX: FEMORAL-TIBIAL BYPASS GRAFT: SHX938

## 2022-10-08 LAB — CREATININE, SERUM
Creatinine, Ser: 0.78 mg/dL (ref 0.61–1.24)
GFR, Estimated: >60 mL/min (ref >60)

## 2022-10-08 LAB — POCT ACTIVATED CLOTTING TIME
Activated Clotting Time: 260 seconds
Activated Clotting Time: 287 seconds
Activated Clotting Time: 223 seconds

## 2022-10-08 LAB — CBC
HCT: 37.3 % — ABNORMAL LOW (ref 39.0–52.0)
Hemoglobin: 13.2 g/dL (ref 13.0–17.0)
MCH: 32.5 pg (ref 26.0–34.0)
MCHC: 35.4 g/dL (ref 30.0–36.0)
MCV: 91.9 fL (ref 80.0–100.0)
Platelets: 189 10*3/uL (ref 150–400)
RBC: 4.06 MIL/uL — ABNORMAL LOW (ref 4.22–5.81)
RDW: 12.8 % (ref 11.5–15.5)
WBC: 7.5 10*3/uL (ref 4.0–10.5)
nRBC: 0 % (ref 0.0–0.2)

## 2022-10-08 LAB — PROTIME-INR
INR: 1 (ref 0.8–1.2)
Prothrombin Time: 13.1 seconds (ref 11.4–15.2)

## 2022-10-08 LAB — TYPE AND SCREEN
ABO/RH(D): O POS
Antibody Screen: NEGATIVE

## 2022-10-08 LAB — APTT: aPTT: 37 seconds — ABNORMAL HIGH (ref 24–36)

## 2022-10-08 LAB — SURGICAL PCR SCREEN
MRSA, PCR: NEGATIVE
Staphylococcus aureus: POSITIVE — AB

## 2022-10-08 LAB — URINALYSIS, ROUTINE W REFLEX MICROSCOPIC
Bilirubin Urine: NEGATIVE
Glucose, UA: NEGATIVE mg/dL
Hgb urine dipstick: NEGATIVE
Ketones, ur: NEGATIVE mg/dL
Leukocytes,Ua: NEGATIVE
Nitrite: NEGATIVE
Protein, ur: NEGATIVE mg/dL
Specific Gravity, Urine: 1.025 (ref 1.005–1.030)
pH: 5 (ref 5.0–8.0)

## 2022-10-08 LAB — ABO/RH: ABO/RH(D): O POS

## 2022-10-08 LAB — GLUCOSE, CAPILLARY
Glucose-Capillary: 122 mg/dL — ABNORMAL HIGH (ref 70–99)
Glucose-Capillary: 107 mg/dL — ABNORMAL HIGH (ref 70–99)
Glucose-Capillary: 137 mg/dL — ABNORMAL HIGH (ref 70–99)

## 2022-10-08 SURGERY — CREATION, BYPASS, ARTERIAL, FEMORAL TO TIBIAL, USING GRAFT
Anesthesia: General | Site: Leg Lower | Laterality: Left

## 2022-10-08 MED ORDER — HYDRALAZINE HCL 20 MG/ML IJ SOLN: 5.0000 mg | INTRAMUSCULAR | Status: DC | PRN

## 2022-10-08 MED ORDER — FENTANYL CITRATE (PF) 250 MCG/5ML IJ SOLN
INTRAMUSCULAR | Status: DC | PRN
  Administered 2022-10-08: 50 ug via INTRAVENOUS
  Administered 2022-10-08: 100 ug via INTRAVENOUS
  Administered 2022-10-08 (×4): 50 ug via INTRAVENOUS

## 2022-10-08 MED ORDER — MIDAZOLAM HCL 2 MG/2ML IJ SOLN
INTRAMUSCULAR | Status: AC
  Filled 2022-10-08: qty 2

## 2022-10-08 MED ORDER — HEPARIN SODIUM (PORCINE) 5000 UNIT/ML IJ SOLN
5000.0000 [IU] | Freq: Three times a day (TID) | INTRAMUSCULAR | Status: DC
  Administered 2022-10-09 – 2022-10-11 (×7): 5000 [IU] via SUBCUTANEOUS
  Filled 2022-10-08 (×7): qty 1

## 2022-10-08 MED ORDER — PROTAMINE SULFATE 10 MG/ML IV SOLN
INTRAVENOUS | Status: AC
  Filled 2022-10-08: qty 5

## 2022-10-08 MED ORDER — SODIUM CHLORIDE 0.9 % IV SOLN: INTRAVENOUS | Status: AC

## 2022-10-08 MED ORDER — FENTANYL CITRATE (PF) 100 MCG/2ML IJ SOLN
INTRAMUSCULAR | Status: AC
  Filled 2022-10-08: qty 2

## 2022-10-08 MED ORDER — SUGAMMADEX SODIUM 200 MG/2ML IV SOLN
INTRAVENOUS | Status: DC | PRN
  Administered 2022-10-08: 200 mg via INTRAVENOUS

## 2022-10-08 MED ORDER — CEFAZOLIN SODIUM-DEXTROSE 2-4 GM/100ML-% IV SOLN
2.0000 g | INTRAVENOUS | Status: AC
  Administered 2022-10-08: 2 g via INTRAVENOUS
  Filled 2022-10-08: qty 100

## 2022-10-08 MED ORDER — EPHEDRINE 5 MG/ML INJ
INTRAVENOUS | Status: AC
  Filled 2022-10-08: qty 5

## 2022-10-08 MED ORDER — HEPARIN SODIUM (PORCINE) 1000 UNIT/ML IJ SOLN
INTRAMUSCULAR | Status: AC
  Filled 2022-10-08: qty 10

## 2022-10-08 MED ORDER — ONDANSETRON HCL 4 MG/2ML IJ SOLN
INTRAMUSCULAR | Status: DC | PRN
  Administered 2022-10-08: 4 mg via INTRAVENOUS

## 2022-10-08 MED ORDER — MORPHINE SULFATE (PF) 2 MG/ML IV SOLN: 2.0000 mg | INTRAVENOUS | Status: DC | PRN

## 2022-10-08 MED ORDER — HEPARIN 6000 UNIT IRRIGATION SOLUTION
Status: AC
  Filled 2022-10-08: qty 500

## 2022-10-08 MED ORDER — LABETALOL HCL 5 MG/ML IV SOLN: 10.0000 mg | INTRAVENOUS | Status: DC | PRN

## 2022-10-08 MED ORDER — ASPIRIN 81 MG PO TBEC
81.0000 mg | DELAYED_RELEASE_TABLET | Freq: Every day | ORAL | Status: DC
  Administered 2022-10-08 – 2022-10-11 (×4): 81 mg via ORAL
  Filled 2022-10-08 (×4): qty 1

## 2022-10-08 MED ORDER — ROCURONIUM BROMIDE 10 MG/ML (PF) SYRINGE
PREFILLED_SYRINGE | INTRAVENOUS | Status: DC | PRN
  Administered 2022-10-08: 40 mg via INTRAVENOUS
  Administered 2022-10-08: 60 mg via INTRAVENOUS

## 2022-10-08 MED ORDER — AMISULPRIDE (ANTIEMETIC) 5 MG/2ML IV SOLN: 10.0000 mg | Freq: Once | INTRAVENOUS | Status: DC | PRN

## 2022-10-08 MED ORDER — ORAL CARE MOUTH RINSE: 15.0000 mL | Freq: Once | OROMUCOSAL | Status: AC

## 2022-10-08 MED ORDER — CHLORHEXIDINE GLUCONATE CLOTH 2 % EX PADS: 6.0000 | MEDICATED_PAD | Freq: Once | CUTANEOUS | Status: DC

## 2022-10-08 MED ORDER — DEXAMETHASONE SODIUM PHOSPHATE 10 MG/ML IJ SOLN
INTRAMUSCULAR | Status: AC
  Filled 2022-10-08: qty 1

## 2022-10-08 MED ORDER — DEXMEDETOMIDINE HCL IN NACL 80 MCG/20ML IV SOLN
INTRAVENOUS | Status: DC | PRN
  Administered 2022-10-08: 8 ug via BUCCAL
  Administered 2022-10-08: 12 ug via BUCCAL

## 2022-10-08 MED ORDER — SODIUM CHLORIDE (PF) 0.9 % IJ SOLN
INTRAVENOUS | Status: DC | PRN
  Administered 2022-10-08: 25 mL via INTRAMUSCULAR

## 2022-10-08 MED ORDER — HEPARIN SODIUM (PORCINE) 1000 UNIT/ML IJ SOLN
INTRAMUSCULAR | Status: DC | PRN
  Administered 2022-10-08: 10000 [IU] via INTRAVENOUS

## 2022-10-08 MED ORDER — ROCURONIUM BROMIDE 10 MG/ML (PF) SYRINGE
PREFILLED_SYRINGE | INTRAVENOUS | Status: AC
  Filled 2022-10-08: qty 10

## 2022-10-08 MED ORDER — CHLORHEXIDINE GLUCONATE 0.12 % MT SOLN
15.0000 mL | Freq: Once | OROMUCOSAL | Status: AC
  Administered 2022-10-08: 15 mL via OROMUCOSAL
  Filled 2022-10-08: qty 15

## 2022-10-08 MED ORDER — LACTATED RINGERS IV SOLN: INTRAVENOUS | Status: DC

## 2022-10-08 MED ORDER — PROPOFOL 10 MG/ML IV BOLUS
INTRAVENOUS | Status: DC | PRN
  Administered 2022-10-08: 200 mg via INTRAVENOUS
  Administered 2022-10-08: 50 mg via INTRAVENOUS

## 2022-10-08 MED ORDER — HEPARIN 6000 UNIT IRRIGATION SOLUTION
Status: DC | PRN
  Administered 2022-10-08: 1

## 2022-10-08 MED ORDER — ACETAMINOPHEN 650 MG RE SUPP: 325.0000 mg | RECTAL | Status: DC | PRN

## 2022-10-08 MED ORDER — INSULIN ASPART 100 UNIT/ML IJ SOLN: 0.0000 [IU] | INTRAMUSCULAR | Status: DC | PRN

## 2022-10-08 MED ORDER — FENTANYL CITRATE (PF) 100 MCG/2ML IJ SOLN
25.0000 ug | INTRAMUSCULAR | Status: DC | PRN
  Administered 2022-10-08: 50 ug via INTRAVENOUS
  Administered 2022-10-08 (×2): 25 ug via INTRAVENOUS

## 2022-10-08 MED ORDER — MIDAZOLAM HCL 2 MG/2ML IJ SOLN
INTRAMUSCULAR | Status: DC | PRN
  Administered 2022-10-08: 2 mg via INTRAVENOUS

## 2022-10-08 MED ORDER — FENTANYL CITRATE (PF) 250 MCG/5ML IJ SOLN
INTRAMUSCULAR | Status: AC
  Filled 2022-10-08: qty 5

## 2022-10-08 MED ORDER — CEFAZOLIN SODIUM-DEXTROSE 2-4 GM/100ML-% IV SOLN
2.0000 g | Freq: Three times a day (TID) | INTRAVENOUS | Status: AC
  Administered 2022-10-08 – 2022-10-09 (×2): 2 g via INTRAVENOUS
  Filled 2022-10-08 (×2): qty 100

## 2022-10-08 MED ORDER — PHENYLEPHRINE HCL-NACL 20-0.9 MG/250ML-% IV SOLN
INTRAVENOUS | Status: DC | PRN
  Administered 2022-10-08: 40 ug/min via INTRAVENOUS

## 2022-10-08 MED ORDER — SODIUM CHLORIDE 0.9 % IV SOLN: 500.0000 mL | Freq: Once | INTRAVENOUS | Status: DC | PRN

## 2022-10-08 MED ORDER — PHENOL 1.4 % MT LIQD: 1.0000 | OROMUCOSAL | Status: DC | PRN

## 2022-10-08 MED ORDER — OXYCODONE-ACETAMINOPHEN 5-325 MG PO TABS
1.0000 | ORAL_TABLET | ORAL | Status: DC | PRN
  Administered 2022-10-08 – 2022-10-10 (×6): 2 via ORAL
  Filled 2022-10-08 (×6): qty 2

## 2022-10-08 MED ORDER — GABAPENTIN 300 MG PO CAPS
300.0000 mg | ORAL_CAPSULE | Freq: Two times a day (BID) | ORAL | Status: DC
  Administered 2022-10-08 – 2022-10-11 (×6): 300 mg via ORAL
  Filled 2022-10-08 (×6): qty 1

## 2022-10-08 MED ORDER — LIDOCAINE 2% (20 MG/ML) 5 ML SYRINGE
INTRAMUSCULAR | Status: AC
  Filled 2022-10-08: qty 5

## 2022-10-08 MED ORDER — MAGNESIUM SULFATE 2 GM/50ML IV SOLN: 2.0000 g | Freq: Every day | INTRAVENOUS | Status: DC | PRN

## 2022-10-08 MED ORDER — GUAIFENESIN-DM 100-10 MG/5ML PO SYRP: 15.0000 mL | ORAL_SOLUTION | ORAL | Status: DC | PRN

## 2022-10-08 MED ORDER — LIDOCAINE 2% (20 MG/ML) 5 ML SYRINGE
INTRAMUSCULAR | Status: DC | PRN
  Administered 2022-10-08: 60 mg via INTRAVENOUS

## 2022-10-08 MED ORDER — CLOPIDOGREL BISULFATE 75 MG PO TABS
75.0000 mg | ORAL_TABLET | Freq: Every day | ORAL | Status: DC
  Administered 2022-10-09 – 2022-10-11 (×3): 75 mg via ORAL
  Filled 2022-10-08 (×3): qty 1

## 2022-10-08 MED ORDER — ALUM & MAG HYDROXIDE-SIMETH 200-200-20 MG/5ML PO SUSP: 15.0000 mL | ORAL | Status: DC | PRN

## 2022-10-08 MED ORDER — PROPOFOL 10 MG/ML IV BOLUS
INTRAVENOUS | Status: AC
  Filled 2022-10-08: qty 20

## 2022-10-08 MED ORDER — ONDANSETRON HCL 4 MG/2ML IJ SOLN: 4.0000 mg | Freq: Four times a day (QID) | INTRAMUSCULAR | Status: DC | PRN

## 2022-10-08 MED ORDER — DEXAMETHASONE SODIUM PHOSPHATE 10 MG/ML IJ SOLN
INTRAMUSCULAR | Status: DC | PRN
  Administered 2022-10-08: 5 mg via INTRAVENOUS

## 2022-10-08 MED ORDER — SODIUM CHLORIDE 0.9 % IV SOLN: INTRAVENOUS | Status: DC

## 2022-10-08 MED ORDER — PROTAMINE SULFATE 10 MG/ML IV SOLN
INTRAVENOUS | Status: DC | PRN
  Administered 2022-10-08: 40 mg via INTRAVENOUS

## 2022-10-08 MED ORDER — PHENYLEPHRINE 80 MCG/ML (10ML) SYRINGE FOR IV PUSH (FOR BLOOD PRESSURE SUPPORT)
PREFILLED_SYRINGE | INTRAVENOUS | Status: AC
  Filled 2022-10-08: qty 10

## 2022-10-08 MED ORDER — ACETAMINOPHEN 325 MG PO TABS: 325.0000 mg | ORAL_TABLET | ORAL | Status: DC | PRN

## 2022-10-08 MED ORDER — METOPROLOL TARTRATE 5 MG/5ML IV SOLN: 2.0000 mg | INTRAVENOUS | Status: DC | PRN

## 2022-10-08 MED ORDER — ATORVASTATIN CALCIUM 40 MG PO TABS
40.0000 mg | ORAL_TABLET | Freq: Every day | ORAL | Status: DC
  Administered 2022-10-08 – 2022-10-11 (×4): 40 mg via ORAL
  Filled 2022-10-08 (×4): qty 1

## 2022-10-08 MED ORDER — CITALOPRAM HYDROBROMIDE 20 MG PO TABS
20.0000 mg | ORAL_TABLET | Freq: Every day | ORAL | Status: DC
  Administered 2022-10-09 – 2022-10-11 (×3): 20 mg via ORAL
  Filled 2022-10-08 (×3): qty 1

## 2022-10-08 MED ORDER — PANTOPRAZOLE SODIUM 40 MG PO TBEC
40.0000 mg | DELAYED_RELEASE_TABLET | Freq: Every day | ORAL | Status: DC
  Administered 2022-10-08 – 2022-10-11 (×4): 40 mg via ORAL
  Filled 2022-10-08 (×4): qty 1

## 2022-10-08 MED ORDER — ONDANSETRON HCL 4 MG/2ML IJ SOLN
INTRAMUSCULAR | Status: AC
  Filled 2022-10-08: qty 2

## 2022-10-08 MED ORDER — EPHEDRINE SULFATE-NACL 50-0.9 MG/10ML-% IV SOSY
PREFILLED_SYRINGE | INTRAVENOUS | Status: DC | PRN
  Administered 2022-10-08: 5 mg via INTRAVENOUS
  Administered 2022-10-08 (×2): 2.5 mg via INTRAVENOUS
  Administered 2022-10-08: 5 mg via INTRAVENOUS
  Administered 2022-10-08 (×2): 2.5 mg via INTRAVENOUS

## 2022-10-08 MED ORDER — DOCUSATE SODIUM 100 MG PO CAPS
100.0000 mg | ORAL_CAPSULE | Freq: Every day | ORAL | Status: DC
  Administered 2022-10-09 – 2022-10-11 (×3): 100 mg via ORAL
  Filled 2022-10-08 (×3): qty 1

## 2022-10-08 MED ORDER — POTASSIUM CHLORIDE CRYS ER 20 MEQ PO TBCR: 20.0000 meq | EXTENDED_RELEASE_TABLET | Freq: Every day | ORAL | Status: DC | PRN

## 2022-10-08 MED ORDER — PHENYLEPHRINE 80 MCG/ML (10ML) SYRINGE FOR IV PUSH (FOR BLOOD PRESSURE SUPPORT)
PREFILLED_SYRINGE | INTRAVENOUS | Status: DC | PRN
  Administered 2022-10-08: 40 ug via INTRAVENOUS
  Administered 2022-10-08 (×4): 80 ug via INTRAVENOUS

## 2022-10-08 MED ORDER — 0.9 % SODIUM CHLORIDE (POUR BTL) OPTIME
TOPICAL | Status: DC | PRN
  Administered 2022-10-08: 2000 mL

## 2022-10-08 SURGICAL SUPPLY — 68 items
ADH SKN CLS APL DERMABOND .7 (GAUZE/BANDAGES/DRESSINGS) ×1
AGENT HMST SPONGE THK3/8 (HEMOSTASIS)
BAG COUNTER SPONGE SURGICOUNT (BAG) ×1 IMPLANT
BAG ISL DRAPE 18X18 STRL (DRAPES) ×1
BAG ISOLATION DRAPE 18X18 (DRAPES) IMPLANT
BAG SPNG CNTER NS LX DISP (BAG) ×1
BANDAGE ESMARK 6X9 LF (GAUZE/BANDAGES/DRESSINGS) IMPLANT
BLADE CLIPPER SURG (BLADE) ×1 IMPLANT
BNDG CMPR 9X6 STRL LF SNTH (GAUZE/BANDAGES/DRESSINGS) ×1
BNDG ESMARK 6X9 LF (GAUZE/BANDAGES/DRESSINGS) ×1
CANISTER SUCT 3000ML PPV (MISCELLANEOUS) ×1 IMPLANT
CANNULA VESSEL 3MM 2 BLNT TIP (CANNULA) IMPLANT
CLIP TI MEDIUM 24 (CLIP) ×1 IMPLANT
CLIP TI WIDE RED SMALL 24 (CLIP) ×1 IMPLANT
COVER PROBE W GEL 5X96 (DRAPES) ×1 IMPLANT
CUFF TOURN SGL QUICK 18X4 (TOURNIQUET CUFF) IMPLANT
CUFF TOURN SGL QUICK 24 (TOURNIQUET CUFF) ×1
CUFF TOURN SGL QUICK 34 (TOURNIQUET CUFF)
CUFF TOURN SGL QUICK 42 (TOURNIQUET CUFF) IMPLANT
CUFF TRNQT CYL 24X4X16.5-23 (TOURNIQUET CUFF) IMPLANT
CUFF TRNQT CYL 34X4.125X (TOURNIQUET CUFF) IMPLANT
DERMABOND ADVANCED .7 DNX12 (GAUZE/BANDAGES/DRESSINGS) ×1 IMPLANT
DRAIN CHANNEL 15F RND FF W/TCR (WOUND CARE) IMPLANT
DRAPE C-ARM 42X72 X-RAY (DRAPES) ×1 IMPLANT
DRAPE HALF SHEET 40X57 (DRAPES) IMPLANT
DRSG COVADERM 4X6 (GAUZE/BANDAGES/DRESSINGS) IMPLANT
ELECT REM PT RETURN 9FT ADLT (ELECTROSURGICAL) ×1
ELECTRODE REM PT RTRN 9FT ADLT (ELECTROSURGICAL) ×1 IMPLANT
EVACUATOR SILICONE 100CC (DRAIN) IMPLANT
GLOVE BIO SURGEON STRL SZ7 (GLOVE) IMPLANT
GLOVE BIO SURGEON STRL SZ7.5 (GLOVE) ×1 IMPLANT
GLOVE BIOGEL PI IND STRL 8 (GLOVE) ×1 IMPLANT
GOWN STRL REUS W/ TWL LRG LVL3 (GOWN DISPOSABLE) ×2 IMPLANT
GOWN STRL REUS W/ TWL XL LVL3 (GOWN DISPOSABLE) ×2 IMPLANT
GOWN STRL REUS W/TWL LRG LVL3 (GOWN DISPOSABLE) ×2
GOWN STRL REUS W/TWL XL LVL3 (GOWN DISPOSABLE) ×2
GRAFT PROPATEN THIN WALL 6X80 (Vascular Products) IMPLANT
GRAFT PROPATEN W/RING 6X80X60 (Vascular Products) IMPLANT
HEMOSTAT SPONGE AVITENE ULTRA (HEMOSTASIS) IMPLANT
KIT BASIN OR (CUSTOM PROCEDURE TRAY) ×1 IMPLANT
KIT TURNOVER KIT B (KITS) ×1 IMPLANT
LOOP VASCULAR MINI 18 RED (MISCELLANEOUS) ×1
NS IRRIG 1000ML POUR BTL (IV SOLUTION) ×2 IMPLANT
PACK PERIPHERAL VASCULAR (CUSTOM PROCEDURE TRAY) ×1 IMPLANT
PAD ARMBOARD 7.5X6 YLW CONV (MISCELLANEOUS) ×2 IMPLANT
PUNCH AORTIC ROTATE 4.0MM (MISCELLANEOUS) IMPLANT
STOPCOCK 4 WAY LG BORE MALE ST (IV SETS) ×1 IMPLANT
SUT ETHILON 3 0 PS 1 (SUTURE) IMPLANT
SUT MNCRL AB 4-0 PS2 18 (SUTURE) ×2 IMPLANT
SUT PROLENE 5 0 C 1 24 (SUTURE) ×1 IMPLANT
SUT PROLENE 6 0 BV (SUTURE) ×1 IMPLANT
SUT PROLENE 7 0 BV 1 (SUTURE) IMPLANT
SUT SILK 2 0 PERMA HAND 18 BK (SUTURE) IMPLANT
SUT SILK 2 0 SH (SUTURE) ×1 IMPLANT
SUT SILK 3 0 (SUTURE) ×1
SUT SILK 3-0 18XBRD TIE 12 (SUTURE) IMPLANT
SUT VIC AB 2-0 CT1 27 (SUTURE) ×2
SUT VIC AB 2-0 CT1 TAPERPNT 27 (SUTURE) ×2 IMPLANT
SUT VIC AB 3-0 SH 27 (SUTURE) ×3
SUT VIC AB 3-0 SH 27X BRD (SUTURE) ×2 IMPLANT
SYR 10ML LL (SYRINGE) IMPLANT
TOWEL GREEN STERILE (TOWEL DISPOSABLE) ×1 IMPLANT
TRAY FOLEY MTR SLVR 16FR STAT (SET/KITS/TRAYS/PACK) ×1 IMPLANT
TUBING EXTENTION W/L.L. (IV SETS) ×1 IMPLANT
UNDERPAD 30X36 HEAVY ABSORB (UNDERPADS AND DIAPERS) ×1 IMPLANT
VASCULAR TIE MINI RED 18IN STL (MISCELLANEOUS) IMPLANT
WATER STERILE IRR 1000ML POUR (IV SOLUTION) ×1 IMPLANT
WIRE MICRO SET SILHO 5FR 7 (SHEATH) IMPLANT

## 2022-10-08 NOTE — Anesthesia Procedure Notes (Signed)
Arterial Line Insertion Start/End4/09/2022 10:45 AM, 10/08/2022 10:50 AM Performed by: Betha Loa, CRNA, CRNA  Patient location: Pre-op. Preanesthetic checklist: patient identified, IV checked, site marked, risks and benefits discussed, surgical consent, monitors and equipment checked, pre-op evaluation, timeout performed and anesthesia consent Lidocaine 1% used for infiltration Right, radial was placed Catheter size: 20 G Hand hygiene performed  and maximum sterile barriers used   Attempts: 1 Procedure performed without using ultrasound guided technique. Following insertion, dressing applied and Biopatch. Post procedure assessment: normal  Patient tolerated the procedure well with no immediate complications.

## 2022-10-08 NOTE — Anesthesia Procedure Notes (Addendum)
Procedure Name: Intubation Date/Time: 10/08/2022 11:30 AM  Performed by: Dorann Lodge, CRNAPre-anesthesia Checklist: Patient identified, Emergency Drugs available, Suction available and Patient being monitored Patient Re-evaluated:Patient Re-evaluated prior to induction Oxygen Delivery Method: Circle System Utilized Preoxygenation: Pre-oxygenation with 100% oxygen Induction Type: IV induction Ventilation: Mask ventilation without difficulty Laryngoscope Size: Mac and 4 Grade View: Grade I Tube type: Oral Tube size: 7.5 mm Number of attempts: 1 Airway Equipment and Method: Stylet and Oral airway Placement Confirmation: ETT inserted through vocal cords under direct vision, positive ETCO2 and breath sounds checked- equal and bilateral Secured at: 22 cm Tube secured with: Tape Dental Injury: Teeth and Oropharynx as per pre-operative assessment

## 2022-10-08 NOTE — Anesthesia Preprocedure Evaluation (Signed)
Anesthesia Evaluation  Patient identified by MRN, date of birth, ID band Patient awake    Reviewed: Allergy & Precautions, NPO status , Patient's Chart, lab work & pertinent test results  Airway Mallampati: II  TM Distance: >3 FB Neck ROM: Full    Dental  (+) Dental Advisory Given   Pulmonary Current Smoker   breath sounds clear to auscultation       Cardiovascular + Peripheral Vascular Disease   Rhythm:Regular Rate:Normal     Neuro/Psych  Neuromuscular disease    GI/Hepatic negative GI ROS,,,(+)     substance abuse  alcohol use  Endo/Other  diabetes, Type 2, Oral Hypoglycemic Agents    Renal/GU negative Renal ROS     Musculoskeletal   Abdominal   Peds  Hematology negative hematology ROS (+)   Anesthesia Other Findings   Reproductive/Obstetrics                             Anesthesia Physical Anesthesia Plan  ASA: 3  Anesthesia Plan: General   Post-op Pain Management: Tylenol PO (pre-op)*   Induction: Intravenous  PONV Risk Score and Plan: 1 and Dexamethasone, Ondansetron and Treatment may vary due to age or medical condition  Airway Management Planned: Oral ETT  Additional Equipment: Arterial line  Intra-op Plan:   Post-operative Plan: Extubation in OR  Informed Consent: I have reviewed the patients History and Physical, chart, labs and discussed the procedure including the risks, benefits and alternatives for the proposed anesthesia with the patient or authorized representative who has indicated his/her understanding and acceptance.     Dental advisory given  Plan Discussed with: CRNA  Anesthesia Plan Comments:        Anesthesia Quick Evaluation

## 2022-10-08 NOTE — Op Note (Signed)
Date: October 08, 2022  Preoperative diagnosis: 1.  Occluded left above-knee popliteal artery to posterior tibial artery vein bypass 2.  Critical limb ischemia of the left lower extremity with tissue loss with great toe ulcer  Postoperative diagnosis: Same  Procedure: 1.  Re-do left lower extremity tibial bypass from the SFA to the posterior tibial artery at the ankle with a 6 mm ringed PTFE graft 2.  Left lower extremity arteriogram  Surgeon: Dr. Marty Heck, MD  Assistant: Arlee Muslim, PA  Indications: 57 year old male well-known to vascular surgery that previously had critical limb ischemia with tissue loss and underwent a left above-knee popliteal artery to posterior tibial artery bypass with great saphenous vein on 01/14/2021.  This bypass occluded and he had several months of symptoms before he was evaluated and we were unable to salvage this with thrombolytics or endovascular therapy.  He presents for redo bypass.  Unfortunately does not have good vein.  Risk benefits discussed.  An assistant was needed for exposure and to expedite the case and for sewing the anastomosis.  Findings: I initially evaluated the right leg great great saphenous vein and there was only a short segment that was of adequate caliber.  The left great saphenous vein had already been harvested in the past for previous bypass.  Ultimately a left lower extremity redo bypass was sewn from the mid SFA to the posterior tibial artery at the ankle in a subcutaneous plane.  We initially did not have a Doppler signal in the foot although there was a brisk signal in the PT artery in the surgical field.  Angiogram showed some spasm distally with a diseased posterior tibial artery but no technical issues.  At that point in time after angiogram we had a brisk posterior tibial signal at the ankle.  Anesthesia: General  Details: Patient was taken to the operating room after informed consent was obtained.  Placed on the  operative table in supine position.  General endotracheal anesthesia was induced.  I initially used ultrasound and evaluated the right leg great saphenous vein that appeared to be short segment based on preoperative vein mapping.  This was confirmed in the operating room as there was only a short segment of saphenous vein in the proximal thigh that was not long enough to be usable.  Distally this branched and was very small.  The left leg great saphenous vein had already been harvested and I did not see any other usable vein.  The left leg and right thigh were then prepped and draped in standard sterile fashion.  Timeout was performed and antibiotics given.  Initially made a longitudinal incision anterior to the sartorius in the mid left thigh.  Dissected down with Bovie cautery and reflected the sartorius posteriorly and used cerebellar retractors.  We found the SFA and this was dissected out with Bovie cautery and controlled proximally and distally with Vesseloops.  There was a segment that was soft for anastomosis and there was a palpable pulse here.  I then went down to the posterior tibial artery at the ankle and made a longitudinal incision over the posterior tibial artery at the previous bypass anastomosis and carried this more distally to just above the ankle.  I dissected out the previous bypass that was a vein graft onto the posterior tibial artery.  At that point, I then used a large Gore tunneler and tunneled from the posterior tibial artery exposure up to the SFA in the subcutaneous plane.  We passed a  6 mm ringed PTFE graft and removed the tunneler.  The patient was given 100 units/kg IV heparin.  ACT was checked to maintain greater than 250.  Initially used Vesseloops to control the SFA and this was opened with 11 blade scalpel and Potts scissors and I used a small aortic punch.  I then spatulated the PTFE graft and this was sewn end to side to the mid SFA in the left leg with 5-0 Prolene parachute  technique.  Once we de-aired the artery there was excellent pulsatile flow distally.  I then elected to try and sew the graft to the hood of the previous bypass onto the posterior tibial artery given this essentially acted like a vein cuff.  Prior to opening this I placed a tourniquet on the upper thigh after the leg was exsanguinated with an Esmarch and we went up to 250 mmHg.  I opened the distal bypass onto the posterior tibial artery and this was patent with no thrombus evident.  I then straighten the graft and cut it to the appropriate length.  I then spatulated the graft and an end to side anastomosis was sewn to the posterior tibial artery using 6-0 Prolene parachute technique with the help of my assitant.  This was de-aired prior to completion.  I did have to put several additional repair sutures with 6-0 Prolene in the distal anastomosis.  We had good hemostasis.  I listened with pencil Doppler at the ankle and there was a brisk triphasic signal.  We could not get a signal at the ankle distal to our incision.  I then elected to perform a left lower extremity arteriogram.  I used a micropuncture needle to access the proximal bypass graft at the SFA and placed a microsheath and then used extension tubing to do performed hand-held left lower extremity angiogram.  The proximal anastomosis was widely patent with a widely patent bypass.  Distally the posterior tibial appeared to have some spasm but there was no technical issue with the distal anastomosis with flow into the foot through the posterior tibial that was diseased.  There was a collateral that also went to the anterior tibial.  Satisfied the results we finally got a posterior tibial signal at the ankle that was brisk just and this was likely spasm related.  Protamine was given.  The SFA exposure was irrigated out and closed with multiple layers of 2-0 Vicryl, 3-0 Vicryl, 4-0 Monocryl and Dermabond.  The distal incision was closed with 3-0 Vicryl, 3-0  nylon with horizontal mattress.  Taken to recovery in stable condition.  Complication: None  Condition: Stable  Marty Heck, MD [facility] Office: [place]

## 2022-10-08 NOTE — H&P (Signed)
History and Physical Interval Note:  10/08/2022 10:12 AM  Erin Hearing  has presented today for surgery, with the diagnosis of Critical limb ischemia of left lower extremity with bypass graf.  The various methods of treatment have been discussed with the patient and family. After consideration of risks, benefits and other options for treatment, the patient has consented to  Procedure(s): REDO LEFT FEMORAL-POSTERIOR TIBIAL BYPASS WITH POSSIBLE VEIN GRAFT (Left) as a surgical intervention.  The patient's history has been reviewed, patient examined, no change in status, stable for surgery.  I have reviewed the patient's chart and labs.  Questions were answered to the patient's satisfaction.    Plan redo left femoral to posterior tibial bypass.  His previous left leg bypass has been down for months as he has had chronic symptoms and unfortunately this was not amendable to percutaneous salvage with lysis or percutaneous thrombectomy.  I discussed potentially harvesting vein from the right leg but this appears to be short segment and may require prosthetic graft.  I discussed higher risk of infection and poor durability.  He has tissue loss and wishes to proceed.  High risk for limb loss.  Marty Heck   HISTORY AND PHYSICAL        CC:  follow up. Requesting Provider:  Fenton Foy, NP   HPI: This is a 57 y.o. male who is here today for follow up for PAD.  Pt has hx of harvest of left leg great saphenous vein and Left above-knee popliteal artery to posterior tibial artery bypass at the ankle with reversed ipsilateral great saphenous vein By Dr. [name] on 01/14/21. He tolerated the surgery well and did well post operatively. He was discharge POD# 4. He presents for follow-up with non-invasive studies.  In April 2023, he had a duplex that revealed elevated velocities at the anastomosis of the bypass above the knee.  He underwent arteriogram LLE on 10/24/2021 with angioplasty of proximal and mid  vein bypass, angioplasty of native left SFA by Dr. [name].    Pt was last seen 12/03/2021 by Dr. [name] and at that time, his bypass was widely patent without evidence of restenosis.  He was encouraged to continue his asa and plavix.  He was scheduled for 6 month follow up.     The pt returns today for follow up.  He states that he has had left foot pain since his surgery.  He bumped his left great toe about 1-2 weeks ago and it is painful.  He states that his left heel is also painful.  He states he has pain in the foot at night.  He states he has trouble getting up at night and does have some crutches he can use.   He cannot really say that his foot pain has gotten worse and has not significantly changed.  He does not have any issues with his right foot.  He has been compliant with his asa and Lipitor but has not been taking his Plavix.  He continues to smoke but has cut back.  He states he soaks his foot at night and uses a grease to moisturize his skin as lotion doesn't work.     The pt is on a statin for cholesterol management.    The pt is on an aspirin.    Other AC:  Plavix The pt is not on medication for hypertension.  The pt does have diabetes. Tobacco hx:  current  Past Medical History:  Diagnosis Date   Diabetes mellitus without complication ([place])     Syphilis      Treated  02/14/2019               Past Surgical History:  Procedure Laterality Date   ABDOMINAL AORTOGRAM W/LOWER EXTREMITY N/A 01/11/2021    Procedure: ABDOMINAL AORTOGRAM W/LOWER EXTREMITY;  Surgeon: Cherre Robins, MD;  Location: [place];  Service: Cardiovascular;  Laterality: N/A;   ABDOMINAL AORTOGRAM W/LOWER EXTREMITY N/A 10/24/2021    Procedure: ABDOMINAL AORTOGRAM W/LOWER EXTREMITY;  Surgeon: Marty Heck, MD;  Location: [place] [facility];  Service: Cardiovascular;  Laterality: N/A;   NO PAST SURGERIES       PERIPHERAL VASCULAR BALLOON ANGIOPLASTY   10/24/2021    Procedure:  PERIPHERAL VASCULAR BALLOON ANGIOPLASTY;  Surgeon: Marty Heck, MD;  Location: [place] [facility];  Service: Cardiovascular;;  Left SFA and Left Pop Bypass   THROMBECTOMY OF BYPASS GRAFT FEMORAL- TIBIAL ARTERY Left 01/14/2021    Procedure: LEFT ABOVE KNEE TO  POSTERIOR TIBIAL BYPASS GRAFT WITH GREATER SAPHENOUS VEIN;  Surgeon: Marty Heck, MD;  Location: [facility];  Service: Vascular;  Laterality: Left;      No Known Allergies              Current Outpatient Medications  Medication Sig Dispense Refill   aspirin EC 81 MG tablet Take 1 tablet (81 mg total) by mouth daily. Swallow whole. 30 tablet 11   atorvastatin (LIPITOR) 40 MG tablet Take 1 tablet (40 mg total) by mouth daily. 90 tablet 1   citalopram (CELEXA) 20 MG tablet Take 1 tablet (20 mg total) by mouth daily. 30 tablet 3   clopidogrel (PLAVIX) 75 MG tablet Take 1 tablet (75 mg total) by mouth daily. 30 tablet 11   ferrous sulfate 325 (65 FE) MG tablet Take 1 tablet (325 mg total) by mouth daily with breakfast. 120 tablet 0   gabapentin (NEURONTIN) 300 MG capsule Take 1 capsule (300 mg total) by mouth 2 (two) times daily. 180 capsule 1   metFORMIN (GLUCOPHAGE) 500 MG tablet Take 1 tablet (500 mg total) by mouth daily with breakfast. 90 tablet 1   nicotine (NICODERM CQ) 21 mg/24hr patch Place 1 patch (21 mg total) onto the skin daily. 28 patch 0   pantoprazole (PROTONIX) 40 MG tablet Take 1 tablet (40 mg total) by mouth daily. 90 tablet 1    No current facility-administered medications for this visit.      Family History  Family history unknown: Yes      Social History             Socioeconomic History   Marital status: Single      Spouse name: Not on file   Number of children: Not on file   Years of education: Not on file   Highest education level: Not on file  Occupational History   Occupation: Architect  Tobacco Use   Smoking status: Every Day      Packs/day: 0.25      Types: Cigarettes, Cigars    Smokeless tobacco: Never  Vaping Use   Vaping Use: Never used  Substance and Sexual Activity   Alcohol use: Yes      Alcohol/week: 24.0 standard drinks of alcohol      Types: 4 Cans of beer, 20 Standard drinks or equivalent per week      Comment: Approx 20-25 beers week   Drug  use: Not Currently      Frequency: 1.0 times per week      Types: Cocaine, Marijuana      Comment: smoke crack   Sexual activity: Not Currently  Other Topics Concern   Not on file  Social History Narrative    ** Merged History Encounter **         Social Determinants of Health           Financial Resource Strain: Not on file  Food Insecurity: Not on file  Transportation Needs: Unmet Transportation Needs (02/13/2022)    PRAPARE - Armed forces logistics/support/administrative officer (Medical): Yes     Lack of Transportation (Non-Medical): Yes  Physical Activity: Not on file  Stress: Not on file  Social Connections: Not on file  Intimate Partner Violence: Not on file        REVIEW OF SYSTEMS:    [X]  denotes positive finding, [ ]  denotes negative finding Cardiac   Comments:  Chest pain or chest pressure:      Shortness of breath upon exertion:      Short of breath when lying flat:      Irregular heart rhythm:             Vascular      Pain in calf, thigh, or hip brought on by ambulation: x    Pain in feet at night that wakes you up from your sleep:  x    Blood clot in your veins:      Leg swelling:              Pulmonary      Oxygen at home:      Productive cough:       Wheezing:              Neurologic      Sudden weakness in arms or legs:       Sudden numbness in arms or legs:       Sudden onset of difficulty speaking or slurred speech:      Temporary loss of vision in one eye:       Problems with dizziness:              Gastrointestinal      Blood in stool:       Vomited blood:              Genitourinary      Burning when urinating:       Blood in urine:             Psychiatric       Major depression:              Hematologic      Bleeding problems:      Problems with blood clotting too easily:             Skin      Rashes or ulcers:             Constitutional      Fever or chills:          PHYSICAL EXAMINATION:        Today's Vitals    09/16/22 1309  BP: 124/76  Pulse: 88  Temp: 98.6 F (37 C)  TempSrc: Temporal  SpO2: 99%  Height: 5\' 6"  (1.676 m)  PainSc: 8     Body mass index is 33.28 kg/m.     General:  WDWN  in NAD; vital signs documented above Gait: slow; limping HENT: WNL, normocephalic Pulmonary: normal non-labored breathing , without wheezing Cardiac: regular HR, without carotid bruits Abdomen: soft, NT; aortic pulse is not palpable Skin: without rashes Vascular Exam/Pulses: Bilateral radial and femoral pulses are palpable Brisk doppler flow right PT/peroneal Dampened monophasic flow left PT Extremities: darkened skin left foot with great toe wound; there is no drainage from the toe wound.        Musculoskeletal: no muscle wasting or atrophy       Neurologic: A&O X 3 Psychiatric:  The pt has Normal affect.     Non-Invasive Vascular Imaging:   ABI's/TBI's on 09/16/2022: Right:  0.93/unable to obtain - Great toe pressure: unable to obtain Left:  0.16/absent - Great toe pressure: absent   Arterial duplex on 09/16/2022: +----------+--------+-----+--------+---------+--------+  LEFT     PSV cm/sRatioStenosisWaveform Comments  +----------+--------+-----+--------+---------+--------+  CFA Prox  118                  triphasic          +----------+--------+-----+--------+---------+--------+  DFA      140                  biphasic           +----------+--------+-----+--------+---------+--------+  SFA Prox  77                   triphasic          +----------+--------+-----+--------+---------+--------+  SFA Mid   83                   triphasic           +----------+--------+-----+--------+---------+--------+  SFA Distal118                  biphasic           +----------+--------+-----+--------+---------+--------+   Left Graft #1: +--------------------+--------+--------+--------+--------+                      PSV cm/sStenosisWaveformComments  +--------------------+--------+--------+--------+--------+  Inflow             21                                +--------------------+--------+--------+--------+--------+  Proximal Anastomosis        occluded                  +--------------------+--------+--------+--------+--------+  Proximal Graft              occluded                  +--------------------+--------+--------+--------+--------+  Mid Graft                   occluded                  +--------------------+--------+--------+--------+--------+  Distal Graft                occluded                  +--------------------+--------+--------+--------+--------+  Distal Anastomosis          occluded                  +--------------------+--------+--------+--------+--------+  Outflow            39                                +--------------------+--------+--------+--------+--------+  Summary:  Left: Occluded above-knee to posterior tibial artery bypass graft.    Previous ABI's/TBI's on 12/03/2021: Right:  0.97/0.30 - Great toe pressure: 43 Left:  1.11/0.62 - Great toe pressure:  89   Previous arterial duplex on 12/03/2021: Summary:  Left: Patent left above-knee popliteal to distal posterior tibial artery bypass graft with no evidence of restenosis.        ASSESSMENT/PLAN:: 57 y.o. male here for follow up for PAD with hx of harvest of left leg great saphenous vein and Left above-knee popliteal artery to posterior tibial artery bypass at the ankle with reversed ipsilateral great saphenous vein By Dr. [name] on 01/14/21. He tolerated the surgery well and did well post operatively. He  was discharge POD# 4. He presents for follow-up with non-invasive studies.  In April 2023, he had a duplex that revealed elevated velocities at the anastomosis of the bypass above the knee.  He underwent arteriogram LLE on 10/24/2021 with angioplasty of proximal and mid vein bypass, angioplasty of native left SFA by Dr. [name].    Critical limb ischemia -pt duplex today reveals his bypass graft is occluded and his ABI on the left is 0.1.  he has a left great toe wound present that has been there for a week or two and is not healing and painful.  His motor and sensory are in tact in the left foot.  Discussed that he would need angiogram and offered 3/14 with Dr. [name], but he tells me this is not enough time to get transportation as they need 3-4 day notice.  Pt seen by Dr. [name] and we will schedule him for 3/21 with Dr. [name].  Discussed for him to protect his foot.  Also discussed with him not to let anyone operate on his foot until we get his blood flow improved b/c as of now, he would not heal this.  He is going to cancel his podiatry appt until after blood flow is re-established.  Discussed with pt that he is at risk of limb loss. -ok to continue foot soaks.   -no evidence of infection currently -continue asa/statin   Current smoker -discussed importance of smoking cessation.  He has cut back the amount he smokes since his last visit.  Stressed for him to continue on working on quitting.     Leontine Locket, [facility] [phone]   Clinic MD:   [name]

## 2022-10-08 NOTE — Transfer of Care (Signed)
Immediate Anesthesia Transfer of Care Note  Patient: Tony Hicks  Procedure(s) Performed: REDO LEFT SUPERFICIAL FEMORAL ARTERY-POSTERIOR TIBIAL BYPASS WITH PTFE (Left: Leg Lower) LOWER LEFT EXTREMITY ANGIOGRAM (Left: Leg Lower)  Patient Location: PACU  Anesthesia Type:General  Level of Consciousness: awake and alert   Airway & Oxygen Therapy: Patient Spontanous Breathing and Patient connected to face mask oxygen  Post-op Assessment: Report given to RN and Post -op Vital signs reviewed and stable  Post vital signs: Reviewed and stable  Last Vitals:  Vitals Value Taken Time  BP 120/67 10/08/22 1448  Temp    Pulse 77 10/08/22 1451  Resp 16 10/08/22 1451  SpO2 98 % 10/08/22 1451  Vitals shown include unvalidated device data.  Last Pain:  Vitals:   10/08/22 0815  TempSrc:   PainSc: 9       Patients Stated Pain Goal: 0 ([id number])  Complications: No notable events documented.

## 2022-10-09 ENCOUNTER — Encounter (HOSPITAL_COMMUNITY): Payer: Self-pay | Admitting: Vascular Surgery

## 2022-10-09 LAB — BASIC METABOLIC PANEL
Anion gap: 8 (ref 5–15)
BUN: 7 mg/dL (ref 6–20)
CO2: 23 mmol/L (ref 22–32)
Calcium: 8.4 mg/dL — ABNORMAL LOW (ref 8.9–10.3)
Chloride: 103 mmol/L (ref 98–111)
Creatinine, Ser: 0.85 mg/dL (ref 0.61–1.24)
GFR, Estimated: >60 mL/min (ref >60)
Glucose, Bld: 153 mg/dL — ABNORMAL HIGH (ref 70–99)
Potassium: 4 mmol/L (ref 3.5–5.1)
Sodium: 134 mmol/L — ABNORMAL LOW (ref 135–145)

## 2022-10-09 LAB — CBC
HCT: 34.1 % — ABNORMAL LOW (ref 39.0–52.0)
Hemoglobin: 12.2 g/dL — ABNORMAL LOW (ref 13.0–17.0)
MCH: 32.8 pg (ref 26.0–34.0)
MCHC: 35.8 g/dL (ref 30.0–36.0)
MCV: 91.7 fL (ref 80.0–100.0)
Platelets: 195 10*3/uL (ref 150–400)
RBC: 3.72 MIL/uL — ABNORMAL LOW (ref 4.22–5.81)
RDW: 12.6 % (ref 11.5–15.5)
WBC: 7.3 10*3/uL (ref 4.0–10.5)
nRBC: 0 % (ref 0.0–0.2)

## 2022-10-09 MED ORDER — MUPIROCIN 2 % EX OINT
1.0000 | TOPICAL_OINTMENT | Freq: Two times a day (BID) | CUTANEOUS | Status: DC
  Administered 2022-10-09 – 2022-10-11 (×5): 1 via NASAL
  Filled 2022-10-09 (×2): qty 22

## 2022-10-09 MED ORDER — CHLORHEXIDINE GLUCONATE CLOTH 2 % EX PADS
6.0000 | MEDICATED_PAD | Freq: Every day | CUTANEOUS | Status: DC
  Administered 2022-10-09: 6 via TOPICAL

## 2022-10-09 NOTE — Anesthesia Postprocedure Evaluation (Signed)
Anesthesia Post Note  Patient: NAZARIY DUNKLEY  Procedure(s) Performed: REDO LEFT SUPERFICIAL FEMORAL ARTERY-POSTERIOR TIBIAL BYPASS WITH PTFE (Left: Leg Lower) LOWER LEFT EXTREMITY ANGIOGRAM (Left: Leg Lower)     Patient location during evaluation: PACU Anesthesia Type: General Level of consciousness: awake and alert Pain management: pain level controlled Vital Signs Assessment: post-procedure vital signs reviewed and stable Respiratory status: spontaneous breathing, nonlabored ventilation, respiratory function stable and patient connected to nasal cannula oxygen Cardiovascular status: blood pressure returned to baseline and stable Postop Assessment: no apparent nausea or vomiting Anesthetic complications: no   No notable events documented.  Last Vitals:  Vitals:   10/08/22 2319 10/09/22 0908  BP: (!) 113/57 125/64  Pulse: 69 63  Resp: 18 18  Temp:  (!) 36.4 C  SpO2: 98% 97%    Last Pain:  Vitals:   10/09/22 0908  TempSrc: Oral  PainSc:                  Tiajuana Amass

## 2022-10-09 NOTE — Evaluation (Signed)
Physical Therapy Evaluation Patient Details Name: Tony Hicks MRN: [id number] DOB: 1966/01/31 Today's Date: 10/09/2022  History of Present Illness  Pt is 57 year old presented to [facility]  10/08/22 for critical limb ischemia of LLE and underwent re-do LLE tibial bypass graft. PMH - LLE bypass, DM2, polysubstance abuse (tobacco, alcohol, cocaine)  Clinical Impression  Pt doing well with mobility and no further PT needed.          Recommendations for follow up therapy are one component of a multi-disciplinary discharge planning process, led by the attending physician.  Recommendations may be updated based on patient status, additional functional criteria and insurance authorization.  Follow Up Recommendations       Assistance Recommended at Discharge None  Patient can return home with the following       Equipment Recommendations None recommended by PT  Recommendations for Other Services       Functional Status Assessment Patient has not had a recent decline in their functional status     Precautions / Restrictions Precautions Precautions: Fall Precaution Comments: LLE Bypass Restrictions Weight Bearing Restrictions: No      Mobility  Bed Mobility Overal bed mobility: Independent                  Transfers Overall transfer level: Modified independent Equipment used: None                    Ambulation/Gait Ambulation/Gait assistance: Modified independent (Device/Increase time) Gait Distance (Feet): 400 Feet Assistive device: None (wall rail intermittently) Gait Pattern/deviations: Step-through pattern, Decreased stance time - left, Antalgic Gait velocity: decr Gait velocity interpretation: 1.31 - 2.62 ft/sec, indicative of limited community ambulator   General Gait Details: Steady antalgic gait  Stairs            Wheelchair Mobility    Modified Rankin (Stroke Patients Only)       Balance Overall balance assessment: Mild deficits  observed, not formally tested                                           Pertinent Vitals/Pain Pain Assessment Pain Assessment: Faces Faces Pain Scale: Hurts even more Pain Location: LLE Pain Descriptors / Indicators: Aching, Guarding Pain Intervention(s): Limited activity within patient's tolerance, Monitored during session    Home Living Family/patient expects to be discharged to:: Private residence Living Arrangements: Alone (will live with Mother until he heals more) Available Help at Discharge: Family;Available PRN/intermittently Type of Home: House         Home Layout: One level Home Equipment: Crutches;Hand held shower head;Grab bars - tub/shower;Grab bars - toilet Additional Comments: May live with mother for about 2 weeks. Mother has a one level home, walk-in shower with raised toilet seat    Prior Function Prior Level of Function : Independent/Modified Independent;Driving             Mobility Comments: Independent no AD ADLs Comments: Independent in bADLs/iADLs     Hand Dominance   Dominant Hand: Right    Extremity/Trunk Assessment   Upper Extremity Assessment Upper Extremity Assessment: Overall WFL for tasks assessed    Lower Extremity Assessment Lower Extremity Assessment: LLE deficits/detail LLE Deficits / Details: Limited by pain LLE: Unable to fully assess due to pain    Cervical / Trunk Assessment Cervical / Trunk Assessment: Normal  Communication  Communication: No difficulties  Cognition Arousal/Alertness: Awake/alert Behavior During Therapy: WFL for tasks assessed/performed Overall Cognitive Status: Within Functional Limits for tasks assessed                                          General Comments      Exercises     Assessment/Plan    PT Assessment Patient does not need any further PT services  PT Problem List         PT Treatment Interventions      PT Goals (Current goals can be found  in the Care Plan section)  Acute Rehab PT Goals PT Goal Formulation: All assessment and education complete, DC therapy    Frequency       Co-evaluation               AM-PAC PT "6 Clicks" Mobility  Outcome Measure Help needed turning from your back to your side while in a flat bed without using bedrails?: None Help needed moving from lying on your back to sitting on the side of a flat bed without using bedrails?: None Help needed moving to and from a bed to a chair (including a wheelchair)?: None Help needed standing up from a chair using your arms (e.g., wheelchair or bedside chair)?: None Help needed to walk in hospital room?: None Help needed climbing 3-5 steps with a railing? : None 6 Click Score: 24    End of Session   Activity Tolerance: Patient tolerated treatment well Patient left: in bed;with call bell/phone within reach   PT Visit Diagnosis: Pain Pain - Right/Left: Left Pain - part of body: Leg;Ankle and joints of foot    Time: [id number] PT Time Calculation (min) (ACUTE ONLY): 10 min   Charges:   PT Evaluation $PT Eval Low Complexity: 1 Low          [place] [facility] [place] 10/09/2022, 1:53 PM

## 2022-10-09 NOTE — Progress Notes (Signed)
  Progress Note    10/09/2022 7:43 AM 1 Day Post-Op  Subjective:  no complaints   Vitals:   10/08/22 1916 10/08/22 2319  BP: 123/86 (!) 113/57  Pulse: 67 69  Resp: 19 18  Temp: (!) 97.5 F (36.4 C)   SpO2: 98% 98%   Physical Exam: Lungs:  non labored Incisions:  L thigh and ankle incisions c/d/i Extremities:  brisk L PT signal onto the foot; brisk L DP signal Neurologic: A&O  CBC    Component Value Date/Time   WBC 7.3 10/09/2022 0121   RBC 3.72 (L) 10/09/2022 0121   HGB 12.2 (L) 10/09/2022 0121   HGB 14.7 09/19/2022 1209   HCT 34.1 (L) 10/09/2022 0121   HCT 42.1 09/19/2022 1209   PLT 195 10/09/2022 0121   PLT 231 09/19/2022 1209   MCV 91.7 10/09/2022 0121   MCV 92 09/19/2022 1209   MCH 32.8 10/09/2022 0121   MCHC 35.8 10/09/2022 0121   RDW 12.6 10/09/2022 0121   RDW 12.8 09/19/2022 1209   LYMPHSABS 1.7 03/14/2021 1204   MONOABS 0.5 01/22/2021 2118   EOSABS 0.1 03/14/2021 1204   BASOSABS 0.0 03/14/2021 1204    BMET    Component Value Date/Time   NA 134 (L) 10/09/2022 0121   NA 144 09/19/2022 1209   K 4.0 10/09/2022 0121   CL 103 10/09/2022 0121   CO2 23 10/09/2022 0121   GLUCOSE 153 (H) 10/09/2022 0121   BUN 7 10/09/2022 0121   BUN 11 09/19/2022 1209   CREATININE 0.85 10/09/2022 0121   CALCIUM 8.4 (L) 10/09/2022 0121   GFRNONAA >60 10/09/2022 0121   GFRAA 101 02/17/2019 1543    INR    Component Value Date/Time   INR 1.0 10/08/2022 0853     Intake/Output Summary (Last 24 hours) at 10/09/2022 0743 Last data filed at 10/09/2022 0655 Gross per 24 hour  Intake 1425 ml  Output 1725 ml  Net -300 ml     Assessment/Plan:  57 y.o. male is s/p L SFA to PTA bypass with PTFE 1 Day Post-Op   L foot well perfused with PT and DP signals by doppler Incisions are well appearing OOB with therapy teams today Potential d/c home tomorrow   Dagoberto Ligas, PA-C [facility] [phone] 10/09/2022 7:43 AM

## 2022-10-09 NOTE — Progress Notes (Signed)
[facility] [place]  Subjective  -no complaints.  Left foot feels better.   Objective (!) 113/57 69 (!) 97.5 F (36.4 C) (Oral) 18 98%  Intake/Output Summary (Last 24 hours) at 10/09/2022 0743 Last data filed at 10/09/2022 0655 Gross per 24 hour  Intake 1425 ml  Output 1725 ml  Net -300 ml    Left thigh incision clean dry and intact with no hematoma Left distal ankle incision with dry dressing Brisk triphasic PT signal at the ankle  Laboratory Lab Results: Recent Labs    10/08/22 1721 10/09/22 0121  WBC 7.5 7.3  HGB 13.2 12.2*  HCT 37.3* 34.1*  PLT 189 195   BMET Recent Labs    10/08/22 1721 10/09/22 0121  NA  --  134*  K  --  4.0  CL  --  103  CO2  --  23  GLUCOSE  --  153*  BUN  --  7  CREATININE 0.78 0.85  CALCIUM  --  8.4*    COAG Lab Results  Component Value Date   INR 1.0 10/08/2022   INR 0.95 06/06/2009   No results found for: "PTT"  Assessment/Planning:  Postop day 1 status post redo left lower extremity tibial bypass from the SFA to the posterior tibial artery with PTFE.  Brisk PT signal at the ankle.  States his foot feels better.  Aspirin Plavix statin for risk reduction.  I discussed that this graft will fail at some point but hopefully will be patent long enough to get his toe to heal.  He will have limited options moving forward.  I discussed the importance of exercise to develop collaterals and smoking cessation.  Tony Hicks 10/09/2022 7:43 AM --

## 2022-10-09 NOTE — Evaluation (Addendum)
Occupational Therapy Evaluation Patient Details Name: Tony Hicks MRN: [id number] DOB: [birth year] Today's Date: 10/09/2022   History of Present Illness Pt is 57 year old presented to [facility] on  10/08/22 for critical limb ischemia of LLE and underwent re-do LLE tibial bypass graft. PMH - LLE bypass, DM2, polysubstance abuse (tobacco, alcohol, cocaine)   Clinical Impression   Patient admitted for above diagnosis. PTA, patient lived alone, completed bADLs/iADLs and functional ambulation Independently no AD.  Patient completing bADLs and functional ambulation with supervision to min guard assist. Would benefit from more instruction on LBD techniques or AE. Some difficulty with lower body dress but patient able to perform in sitting with UE support and increased time, educated patient on the use and benefits of hipkit to assist with LBD. Patient would benefit from post acute Home OT services to help maximize functional independence in natural environment.      Recommendations for follow up therapy are one component of a multi-disciplinary discharge planning process, led by the attending physician.  Recommendations may be updated based on patient status, additional functional criteria and insurance authorization.   Assistance Recommended at Discharge Set up Supervision/Assistance  Patient can return home with the following A little help with bathing/dressing/bathroom;Assistance with cooking/housework;Assist for transportation;Help with stairs or ramp for entrance    Functional Status Assessment  Patient has not had a recent decline in their functional status  Equipment Recommendations  Tub/shower bench    Recommendations for Other Services       Precautions / Restrictions Precautions Precautions: Fall Precaution Comments: LLE Bypass Restrictions Weight Bearing Restrictions: No      Mobility Bed Mobility Overal bed mobility: Modified Independent                   Transfers Overall transfer level: Needs assistance Equipment used: None Transfers: Sit to/from Stand Sit to Stand: Supervision                  Balance Overall balance assessment: Mild deficits observed, not formally tested                                         ADL either performed or assessed with clinical judgement   ADL Overall ADL's : Needs assistance/impaired Eating/Feeding: Independent;Sitting   Grooming: Standing;Supervision/safety   Upper Body Bathing: Sitting;Supervision/ safety;Set up   Lower Body Bathing: Sitting/lateral leans;Min guard   Upper Body Dressing : Standing;Supervision/safety Upper Body Dressing Details (indicate cue type and reason): Pt donned gown like jacket Lower Body Dressing: Min guard;Sitting/lateral leans Lower Body Dressing Details (indicate cue type and reason): Pt able to don bilat socks with one UE supported while bending at the hip. Increased difficulty doffing L sock, needs UE support to perform action Toilet Transfer: Min guard;Ambulation   Toileting- Clothing Manipulation and Hygiene: Min guard;Sit to/from stand   Tub/ Banker: Ambulation;Min guard   Functional mobility during ADLs: Supervision/safety General ADL Comments: Pt exhibiting waddling gait in compensation of pain while ambulate     Vision         Perception     Praxis      Pertinent Vitals/Pain Pain Assessment Pain Assessment: 0-10 Pain Score: 10-Worst pain ever Pain Location: LLE Pain Descriptors / Indicators: Aching Pain Intervention(s): Limited activity within patient's tolerance, Monitored during session, Premedicated before session     Hand Dominance Right   Extremity/Trunk Assessment Upper  Extremity Assessment Upper Extremity Assessment: Overall WFL for tasks assessed   Lower Extremity Assessment Lower Extremity Assessment: LLE deficits/detail LLE Deficits / Details: Limited by pain LLE: Unable to fully assess  due to pain   Cervical / Trunk Assessment Cervical / Trunk Assessment: Normal   Communication Communication Communication: No difficulties   Cognition Arousal/Alertness: Awake/alert Behavior During Therapy: WFL for tasks assessed/performed Overall Cognitive Status: Within Functional Limits for tasks assessed                                       General Comments  VSS on RA    Exercises     Shoulder Instructions      Home Living Family/patient expects to be discharged to:: Private residence Living Arrangements: Alone (will live with Mother until he heals more) Available Help at Discharge: Family;Available PRN/intermittently Type of Home: House       Home Layout: One level     Bathroom Shower/Tub: Teacher, early years/pre: Handicapped height     Home Equipment: Crutches;Hand held shower head;Grab bars - tub/shower;Grab bars - toilet   Additional Comments: May live with mother for about 2 weeks. Mother has a one level home, walk-in shower with raised toilet seat      Prior Functioning/Environment Prior Level of Function : Independent/Modified Independent;Driving             Mobility Comments: Independent no AD ADLs Comments: Independent in bADLs/iADLs        OT Problem List: Pain;Impaired balance (sitting and/or standing)      OT Treatment/Interventions:      OT Goals(Current goals can be found in the care plan section) Acute Rehab OT Goals Patient Stated Goal: To return home OT Goal Formulation: With patient Time For Goal Achievement: 10/23/22 Potential to Achieve Goals: Good  OT Frequency:      Co-evaluation              AM-PAC OT "6 Clicks" Daily Activity     Outcome Measure Help from another person eating meals?: None Help from another person taking care of personal grooming?: A Little Help from another person toileting, which includes using toliet, bedpan, or urinal?: A Little Help from another person bathing  (including washing, rinsing, drying)?: A Little Help from another person to put on and taking off regular upper body clothing?: A Little Help from another person to put on and taking off regular lower body clothing?: A Little 6 Click Score: 19   End of Session Equipment Utilized During Treatment: Gait belt Nurse Communication: Mobility status  Activity Tolerance: Patient tolerated treatment well Patient left: in chair  OT Visit Diagnosis: Pain;Unsteadiness on feet (R26.81) Pain - Right/Left: Left Pain - part of body: Leg                Time: [id number] OT Time Calculation (min): 22 min Charges:  OT General Charges $OT Visit: 1 Visit OT Evaluation $OT Eval Moderate Complexity: 1 Mod  10/09/2022  AB, OTR/L  Acute Rehabilitation Services  Office: [place] 10/09/2022, 1:58 PM

## 2022-10-10 NOTE — Discharge Instructions (Signed)
 [facility] [place]  Discharge instructions  Lower Extremity Bypass Surgery  Please refer to the following instruction for your post-procedure care. Your surgeon or physician assistant will discuss any changes with you.  Activity  You are encouraged to walk as much as you can. You can slowly return to normal activities during the month after your surgery. Avoid strenuous activity and heavy lifting until your doctor tells you it's OK. Avoid activities such as vacuuming or swinging a golf club. Do not drive until your doctor give the OK and you are no longer taking prescription pain medications. It is also normal to have difficulty with sleep habits, eating and bowel movement after surgery. These will go away with time.  Bathing/Showering  Shower daily after you go home. Do not soak in a bathtub, hot tub, or swim until the incision heals completely.  Incision Care  Clean your incision with mild soap and water. Shower every day. Pat the area dry with a clean towel. You do not need a bandage unless otherwise instructed. Do not apply any ointments or creams to your incision. If you have open wounds you will be instructed how to care for them or a visiting nurse may be arranged for you. If you have staples or sutures along your incision they will be removed at your post-op appointment. You may have skin glue on your incision. Do not peel it off. It will come off on its own in about one week.  Wash the groin wound with soap and water daily and pat dry. (No tub bath-only shower)  Then put a dry gauze or washcloth in the groin to keep this area dry to help prevent wound infection.  Do this daily and as needed.  Do not use Vaseline or neosporin on your incisions.  Only use soap and water on your incisions and then protect and keep dry.  Diet  Resume your normal diet. There are no special food restrictions following this procedure. A low fat/ low cholesterol diet is  recommended for all patients with vascular disease. In order to heal from your surgery, it is CRITICAL to get adequate nutrition. Your body requires vitamins, minerals, and protein. Vegetables are the best source of vitamins and minerals. Vegetables also provide the perfect balance of protein. Processed food has little nutritional value, so try to avoid this.  Medications  Resume taking all your medications unless your doctor or physician assistant tells you not to. If your incision is causing pain, you may take over-the-counter pain relievers such as acetaminophen (Tylenol). If you were prescribed a stronger pain medication, please aware these medication can cause nausea and constipation. Prevent nausea by taking the medication with a snack or meal. Avoid constipation by drinking plenty of fluids and eating foods with high amount of fiber, such as fruits, vegetables, and grains. Take Colace 100 mg (an over-the-counter stool softener) twice a day as needed for constipation.  Do not take Tylenol if you are taking prescription pain medications.  Follow Up  Our office will schedule a follow up appointment 2-3 weeks following discharge.  Please call us immediately for any of the following conditions  Severe or worsening pain in your legs or feet while at rest or while walking Increase pain, redness, warmth, or drainage (pus) from your incision site(s) Fever of 101 degree or higher The swelling in your leg with the bypass suddenly worsens and becomes more painful than when you were in the hospital If you have   been instructed to feel your graft pulse then you should do so every day. If you can no longer feel this pulse, call the office immediately. Not all patients are given this instruction.  Leg swelling is common after leg bypass surgery.  The swelling should improve over a few months following surgery. To improve the swelling, you may elevate your legs above the level of your heart while you are  sitting or resting. Your surgeon or physician assistant may ask you to apply an ACE wrap or wear compression (TED) stockings to help to reduce swelling.  Reduce your risk of vascular disease  Stop smoking. If you would like help call [facility] at 1-800-QUIT-NOW (1-800-784-8669) or [place] at 336-586-4000.  Manage your cholesterol Maintain a desired weight Control your diabetes weight Control your diabetes Keep your blood pressure down  If you have any questions, please call the office at [phone]  

## 2022-10-10 NOTE — Progress Notes (Addendum)
  Progress Note    10/10/2022 7:32 AM 2 Days Post-Op  Subjective:  says left thigh sore. Feels he is not ready to go home yet. Plans to d/c to his mothers house. Says he has ambulated in hall   Vitals:   10/09/22 2334 10/10/22 0513  BP: (!) 144/74 (!) 146/97  Pulse: 70 75  Resp: 20 15  Temp: 97.6 F (36.4 C) 97.8 F (36.6 C)  SpO2: 99% 99%   Physical Exam: Cardiac:  regular Lungs:  non labored Incisions:  left thigh and distal medial leg incision are intact and well appearing Extremities:  Left leg with palpable DP, PT signal intact. Left calf soft Neurologic: alert and oriented  CBC    Component Value Date/Time   WBC 7.3 10/09/2022 0121   RBC 3.72 (L) 10/09/2022 0121   HGB 12.2 (L) 10/09/2022 0121   HGB 14.7 09/19/2022 1209   HCT 34.1 (L) 10/09/2022 0121   HCT 42.1 09/19/2022 1209   PLT 195 10/09/2022 0121   PLT 231 09/19/2022 1209   MCV 91.7 10/09/2022 0121   MCV 92 09/19/2022 1209   MCH 32.8 10/09/2022 0121   MCHC 35.8 10/09/2022 0121   RDW 12.6 10/09/2022 0121   RDW 12.8 09/19/2022 1209   LYMPHSABS 1.7 03/14/2021 1204   MONOABS 0.5 01/22/2021 2118   EOSABS 0.1 03/14/2021 1204   BASOSABS 0.0 03/14/2021 1204    BMET    Component Value Date/Time   NA 134 (L) 10/09/2022 0121   NA 144 09/19/2022 1209   K 4.0 10/09/2022 0121   CL 103 10/09/2022 0121   CO2 23 10/09/2022 0121   GLUCOSE 153 (H) 10/09/2022 0121   BUN 7 10/09/2022 0121   BUN 11 09/19/2022 1209   CREATININE 0.85 10/09/2022 0121   CALCIUM 8.4 (L) 10/09/2022 0121   GFRNONAA >60 10/09/2022 0121   GFRAA 101 02/17/2019 1543    INR    Component Value Date/Time   INR 1.0 10/08/2022 0853     Intake/Output Summary (Last 24 hours) at 10/10/2022 0732 Last data filed at 10/09/2022 1726 Gross per 24 hour  Intake 720 ml  Output --  Net 720 ml     Assessment/Plan:  57 y.o. male is s/p redo left lower extremity tibial bypass from the SFA to the posterior tibial artery with PTFE   2 Days Post-Op    Palpable left Dp and doppler PT signal in foot Incisions are intact and well appearing Continue Aspirin, Plavix, Statin Continue to mobilize as tolerated Cleared by PT/OT Patient feels he needs another day or 2 before d/c  Graceann Congress, PA-C [facility] [phone] 10/10/2022 7:32 AM  I have seen and evaluated the patient. I agree with the PA note as documented above.  Postop day 2 status post redo left lower extremity tibial bypass with PTFE for CLI with tissue loss.  Left SFA to posterior tibial.  Brisk triphasic PT signal at the ankle.  Aspirin statin Plavix.  His incisions look great.  Feels he needs at least another day to mobilize.  Hopefully home over the weekend.  Discussed follow-up in 2 to 3 weeks for incision checks.  Cephus Shelling, MD [facility] [place] Office: [phone]

## 2022-10-10 NOTE — Progress Notes (Signed)
Occupational Therapy Treatment and DC Summary  Patient Details Name: Tony Hicks MRN: [id number] DOB: Mar 09, 1966 Today's Date: 10/10/2022   History of present illness Pt is 57 year old presented to [facility] on  10/08/22 for critical limb ischemia of LLE and underwent re-do LLE tibial bypass graft. PMH - LLE bypass, DM2, polysubstance abuse (tobacco, alcohol, cocaine)   OT comments  Pt continuing to progress towards patient focused goals. Pt educated on the use of AE for Lower Body Dressing and demonstrated proper use of AE. Pt able to complete LBD with Supervision while using AE, continuing to ambulate with supervision in halls, and has met all OT goals. Pt has no further skilled acute OT needs and would benefit from continued mobility with mobility specialist while in acute care setting. No follow-up OT recommended at this time.   Recommendations for follow up therapy are one component of a multi-disciplinary discharge planning process, led by the attending physician.  Recommendations may be updated based on patient status, additional functional criteria and insurance authorization.    Assistance Recommended at Discharge PRN  Patient can return home with the following  A little help with bathing/dressing/bathroom;Assistance with cooking/housework;Assist for transportation;Help with stairs or ramp for entrance   Equipment Recommendations  Tub/shower bench    Recommendations for Other Services      Precautions / Restrictions Precautions Precautions: Fall Precaution Comments: LLE Bypass Restrictions Weight Bearing Restrictions: No       Mobility Bed Mobility Overal bed mobility: Modified Independent                  Transfers Overall transfer level: Needs assistance Equipment used: None Transfers: Sit to/from Stand Sit to Stand: Supervision                 Balance Overall balance assessment: Mild deficits observed, not formally tested                                          ADL either performed or assessed with clinical judgement   ADL                       Lower Body Dressing: Sitting/lateral leans;With adaptive equipment;Cueing for sequencing;Supervision/safety Lower Body Dressing Details (indicate cue type and reason): Pt donned R sock using reacher and sockaid Toilet Transfer: Supervision/safety;Ambulation           Functional mobility during ADLs: Supervision/safety      Extremity/Trunk Assessment              Vision       Perception     Praxis      Cognition Arousal/Alertness: Awake/alert Behavior During Therapy: WFL for tasks assessed/performed Overall Cognitive Status: Within Functional Limits for tasks assessed                                          Exercises      Shoulder Instructions       General Comments VSS on RA    Pertinent Vitals/ Pain       Pain Assessment Pain Assessment: 0-10 Pain Score: 7  Pain Location: LLE Pain Descriptors / Indicators: Aching Pain Intervention(s): Limited activity within patient's tolerance, Monitored during session, Premedicated before session  Home Living  Prior Functioning/Environment              Frequency  Other (comment) (DC)        Progress Toward Goals  OT Goals(current goals can now be found in the care plan section)  Progress towards OT goals: Progressing toward goals  Acute Rehab OT Goals Patient Stated Goal: to return home OT Goal Formulation: With patient Time For Goal Achievement: 10/23/22 Potential to Achieve Goals: Good  Plan All goals met and education completed, patient discharged from OT services    Co-evaluation                 AM-PAC OT "6 Clicks" Daily Activity     Outcome Measure   Help from another person eating meals?: None Help from another person taking care of personal grooming?: None Help from another person  toileting, which includes using toliet, bedpan, or urinal?: A Little Help from another person bathing (including washing, rinsing, drying)?: A Little Help from another person to put on and taking off regular upper body clothing?: A Little Help from another person to put on and taking off regular lower body clothing?: A Little 6 Click Score: 20    End of Session    OT Visit Diagnosis: Pain;Unsteadiness on feet (R26.81) Pain - Right/Left: Left Pain - part of body: Leg   Activity Tolerance Patient tolerated treatment well   Patient Left in bed   Nurse Communication Mobility status        Time: [id number] OT Time Calculation (min): 9 min  Charges: OT General Charges $OT Visit: 1 Visit OT Treatments $Self Care/Home Management : 8-22 mins  10/10/2022  AB, OTR/L  Acute Rehabilitation Services  Office: [phone]   Tristan Schroedernthony D Daimon Kean 10/10/2022, 2:21 PM

## 2022-10-11 MED ORDER — OXYCODONE-ACETAMINOPHEN 5-325 MG PO TABS: 1.0000 | ORAL_TABLET | Freq: Four times a day (QID) | ORAL | 0 refills | Status: DC | PRN

## 2022-10-11 MED ORDER — OXYCODONE-ACETAMINOPHEN 5-325 MG PO TABS
1.0000 | ORAL_TABLET | Freq: Four times a day (QID) | ORAL | 0 refills | Status: DC | PRN
  Filled 2022-10-11: qty 20, 3d supply, fill #0

## 2022-10-11 MED ORDER — ORAL CARE MOUTH RINSE: 15.0000 mL | OROMUCOSAL | Status: DC | PRN

## 2022-10-11 NOTE — Progress Notes (Signed)
  Progress Note    10/11/2022 8:33 AM 3 Days Post-Op  Subjective:  feeling good and ready to go home. Denies any pain in the left foot    Vitals:   10/11/22 0301 10/11/22 0716  BP: (!) 144/67 (!) 148/79  Pulse: 71 86  Resp: 20 20  Temp: 98.1 F (36.7 C) 98 F (36.7 C)  SpO2: 100% 100%    Physical Exam: General:  resting comfortably Cardiac:  regular Lungs:  nonlabored Incisions:  left medial thigh and left medial ankle incisions intact and dry. Nylons at ankle incision. Extremities:  palpable L DP, brisk L PT doppler signal  CBC    Component Value Date/Time   WBC 7.3 10/09/2022 0121   RBC 3.72 (L) 10/09/2022 0121   HGB 12.2 (L) 10/09/2022 0121   HGB 14.7 09/19/2022 1209   HCT 34.1 (L) 10/09/2022 0121   HCT 42.1 09/19/2022 1209   PLT 195 10/09/2022 0121   PLT 231 09/19/2022 1209   MCV 91.7 10/09/2022 0121   MCV 92 09/19/2022 1209   MCH 32.8 10/09/2022 0121   MCHC 35.8 10/09/2022 0121   RDW 12.6 10/09/2022 0121   RDW 12.8 09/19/2022 1209   LYMPHSABS 1.7 03/14/2021 1204   MONOABS 0.5 01/22/2021 2118   EOSABS 0.1 03/14/2021 1204   BASOSABS 0.0 03/14/2021 1204    BMET    Component Value Date/Time   NA 134 (L) 10/09/2022 0121   NA 144 09/19/2022 1209   K 4.0 10/09/2022 0121   CL 103 10/09/2022 0121   CO2 23 10/09/2022 0121   GLUCOSE 153 (H) 10/09/2022 0121   BUN 7 10/09/2022 0121   BUN 11 09/19/2022 1209   CREATININE 0.85 10/09/2022 0121   CALCIUM 8.4 (L) 10/09/2022 0121   GFRNONAA >60 10/09/2022 0121   GFRAA 101 02/17/2019 1543    INR    Component Value Date/Time   INR 1.0 10/08/2022 0853     Intake/Output Summary (Last 24 hours) at 10/11/2022 4536 Last data filed at 10/10/2022 1136 Gross per 24 hour  Intake 240 ml  Output --  Net 240 ml      Assessment/Plan:  57 y.o. male is 3 days post op, s/p: redo left lower extremity tibial bypass from the SFA to the posterior tibial artery with PTFE    -He is feeling good this morning and ready to  go home. Denies any pain in the left foot. He is mobilizing well -Left thigh and ankle incisions intact and dry. Nylons at ankle incision will stay in until follow up appointment with our office -He has a brisk left PT doppler signal and palpable left DP -He will continue ASA, plavix, and statin. I will arrange follow up with our office in 3 weeks for incision check   Loel Dubonnet, PA-C [facility] [phone] 10/11/2022 8:33 AM

## 2022-10-11 NOTE — TOC Transition Note (Signed)
Transition of Care [facility]) - CM/SW Discharge Note   Patient Details  Name: Tony Hicks MRN: [id number] Date of Birth: [birth year]  Transition of Care [facility]) CM/SW Contact:  Lawerance Sabal, RN Phone Number: 10/11/2022, 10:10 AM   Clinical Narrative:      Spoke w patient, we discussed pharmacy options, his one of preference is not open on the weekends.  He states that he can have his mother pick them up at [facility] on [address]. PA will update scripts.  No other TOC needs identified.        Patient Goals and CMS Choice      Discharge Placement                         Discharge Plan and Services Additional resources added to the After Visit Summary for                                       Social Determinants of Health (SDOH) Interventions SDOH Screenings   Transportation Needs: Unmet Transportation Needs (02/13/2022)  Alcohol Screen: Medium Risk (01/30/2021)  Depression (PHQ2-9): Low Risk  (09/19/2022)  Tobacco Use: High Risk (10/09/2022)     Readmission Risk Interventions     No data to display

## 2022-10-13 ENCOUNTER — Other Ambulatory Visit: Payer: Self-pay

## 2022-10-14 ENCOUNTER — Telehealth: Payer: Self-pay

## 2022-10-14 NOTE — Transitions of Care (Post Inpatient/ED Visit) (Signed)
   10/14/2022  Name: Tony Hicks MRN: [id number] DOB: 01/20/1966  Today's TOC FU Call Status: Today's TOC FU Call Status:: Unsuccessul Call (1st Attempt) Unsuccessful Call (1st Attempt) Date: 10/14/22  Attempted to reach the patient regarding the most recent Inpatient/ED visit.  Follow Up Plan: Additional outreach attempts will be made to reach the patient to complete the Transitions of Care (Post Inpatient/ED visit) call.   Agnes Lawrence, CMA (AAMA)  [facility] [phone]

## 2022-10-14 NOTE — Telephone Encounter (Signed)
Caller: Patient  Concern: small amount of clear drainage from distal part of incision  Location: left leg above ankle  Description:  noticed this AM  Treatments: none  Procedure: Vascular Bypass on 10/08/22  Resolution:  Instructed pt to keep incision clean, dry, and covered with gauze, change frequently, and monitor for symptoms of infection  Next Appt: Already sch for 10/28/22

## 2022-10-14 NOTE — Telephone Encounter (Signed)
Patient LVM on 10/14/22 returning your call. Requests a callback.

## 2022-10-15 NOTE — Transitions of Care (Post Inpatient/ED Visit) (Signed)
   10/15/2022  Name: Tony Hicks MRN: [id number] DOB: 03-Oct-1965  Today's TOC FU Call Status: Today's TOC FU Call Status:: Successful TOC FU Call Competed Unsuccessful Call (1st Attempt) Date: 10/14/22 [facility] FU Call Complete Date: 10/15/22  Transition Care Management Follow-up Telephone Call Date of Discharge: 10/11/22 Discharge Facility: [facility]) Type of Discharge: Inpatient Admission Primary Inpatient Discharge Diagnosis:: Critical lower limb ischemia How have you been since you were released from the hospital?: Better Any questions or concerns?: No  Items Reviewed: Did you receive and understand the discharge instructions provided?: Yes Medications obtained and verified?: Yes (Medications Reviewed) Any new allergies since your discharge?: No Dietary orders reviewed?: NA Do you have support at home?: Yes  Home Care and Equipment/Supplies: Were Home Health Services Ordered?: NA Any new equipment or medical supplies ordered?: NA  Functional Questionnaire: Do you need assistance with bathing/showering or dressing?: No Do you need assistance with meal preparation?: No Do you need assistance with eating?: No Do you have difficulty maintaining continence: No Do you need assistance with getting out of bed/getting out of a chair/moving?: No Do you have difficulty managing or taking your medications?: No  Follow up appointments reviewed: PCP Follow-up appointment confirmed?: NA (declined- will follow up with specialist) Specialist Hospital Follow-up appointment confirmed?: Yes Date of Specialist follow-up appointment?: 10/28/22 Follow-Up Specialty Provider:: Dr. Chestine Spore Do you need transportation to your follow-up appointment?: No Do you understand care options if your condition(s) worsen?: Yes-patient verbalized understanding   Agnes Lawrence, CMA (AAMA)  [facility] [phone]

## 2022-10-17 NOTE — Discharge Summary (Signed)
Bypass Discharge Summary Patient ID: Tony Hicks [id number] 57 y.o. Jun 29, 1966  Admit date: 10/08/2022  Discharge date and time: 10/11/2022 12:56 PM   Admitting Physician: Cephus Shelling, MD   Discharge Physician: Cephus Shelling, MD   Admission Diagnoses: Critical lower limb ischemia [I70.229]  Discharge Diagnoses: Critical lower limb ischemia [I70.229]  Admission Condition: poor  Discharged Condition: fair  Indication for Admission: 57 year old male well-known to vascular surgery that previously had critical limb ischemia with tissue loss and underwent a left above-knee popliteal artery to posterior tibial artery bypass with great saphenous vein on 01/14/2021.  This bypass occluded and he had several months of symptoms before he was evaluated and we were unable to salvage this with thrombolytics or endovascular therapy.  He presents for redo bypass.  Unfortunately does not have good vein.  Risk benefits discussed.  An assistant was needed for exposure and to expedite the case and for sewing the anastomosis.   Hospital Course: The patient was admitted to the hospital on 10/08/2022 and underwent redo left lower extremity SFA to posterior tibial artery bypass with PTFE.  He tolerated the procedure well and was transferred to the PACU in stable condition.  By POD 1 he was tolerating a normal diet without difficulty swallowing.  He continued to increase in mobilization and strengthening postop.  PT/OT evaluated the patient and recommended no needed home health services.   He was discharged home on POD 3.   Consults: None  Treatments: IV hydration, antibiotics: Ancef, analgesia: percocet, anticoagulation: ASA and Plavix, and surgery: redo left lower extremity SFA to posterior tibial artery bypass with PTFE    Disposition: Discharge disposition: 01-Home or Self Care       - For [facility] Registry use ---  Post-op:  Wound infection: No  Graft infection: No  Transfusion:  No  If yes,  units given New Arrhythmia: No Patency judged by: [ x] Dopper only,  Palpable graft pulse, [x ] Palpable distal pulse,  ABI inc. > 0.15,  Duplex D/C Ambulatory Status: Ambulatory  Complications: MI: [ x] No,  Troponin only,  EKG or Clinical CHF: No Resp failure: [ x] none,  Pneumonia,  Ventilator Chg in renal function: [x ] none,  Inc. Cr > 0.5,  Temp. Dialysis,  Permanent dialysis Stroke: [x ] None,  Minor,  Major Return to OR: No  Reason for return to OR:  Bleeding,  Infection,  Thrombosis,  Revision  Discharge medications: Statin use:  Yes ASA use:  Yes Plavix use:  Yes Beta blocker use: No  for medical reason not indicated Coumadin use: No  for medical reason not indicated    Patient Instructions:  Allergies as of 10/11/2022   No Known Allergies      Medication List     TAKE these medications    aspirin EC 81 MG tablet Take 1 tablet (81 mg total) by mouth daily. Swallow whole.   atorvastatin 40 MG tablet Commonly known as: LIPITOR Take 1 tablet (40 mg total) by mouth daily.   citalopram 20 MG tablet Commonly known as: CELEXA Take 1 tablet (20 mg total) by mouth daily.   clopidogrel 75 MG tablet Commonly known as: Plavix Take 1 tablet (75 mg total) by mouth daily.   FeroSul 325 (65 FE) MG tablet Generic drug: ferrous sulfate Take 1 tablet (325 mg total) by mouth daily with breakfast.   gabapentin  300 MG capsule Commonly known as: NEURONTIN Take 1 capsule (300 mg total) by mouth 2 (two) times daily.   metFORMIN 500 MG tablet Commonly known as: GLUCOPHAGE Take 1 tablet (500 mg total) by mouth daily with breakfast.   nicotine 21 mg/24hr patch Commonly known as: Nicoderm CQ Place 1 patch (21 mg total) onto the skin daily.   omega-3 acid ethyl esters 1 g capsule Commonly known as: LOVAZA Take 1 capsule (1 g total) by mouth 2 (two) times daily.   oxyCODONE-acetaminophen 5-325 MG  tablet Commonly known as: Percocet Take 1 tablet by mouth every 6 (six) hours as needed for severe pain.   pantoprazole 40 MG tablet Commonly known as: PROTONIX Take 1 tablet (40 mg total) by mouth daily.               Discharge Care Instructions  (From admission, onward)           Start     Ordered   10/11/22 0000  Discharge wound care:       Comments: Wash incisions daily with soap and water, pat dry. Keep left leg wounds clean and dry daily   10/11/22 0843   10/11/22 0000  Discharge wound care:       Comments: Wash incisions daily with soap and water   10/11/22 1014           Activity: activity as tolerated, no driving while on analgesics, and no heavy lifting for 4 weeks Diet: low fat, low cholesterol diet Wound Care: keep wound clean and dry  Follow-up with [facility] in 3 weeks.  SignedErnestene Mention 10/17/2022 11:23 PM

## 2022-10-20 ENCOUNTER — Other Ambulatory Visit: Payer: Self-pay

## 2022-10-21 ENCOUNTER — Telehealth: Payer: Self-pay

## 2022-10-21 NOTE — Telephone Encounter (Signed)
Pt called saying he was having some bleeding issues and needed advice.  Reviewed pt's chart, returned call for clarification, no answer, lf vm instructing him to go to ED, urgent care, or call 911 depending on the severity of the bleeding.

## 2022-10-21 NOTE — Telephone Encounter (Signed)
Returned pt's call and left a voicemail for him to call us back if he still needs assistance.

## 2022-10-22 ENCOUNTER — Encounter (HOSPITAL_COMMUNITY)
Admission: EM | Disposition: A | Discharge status: Home / Self Care | Payer: Self-pay | Source: Home / Self Care | Attending: Vascular Surgery

## 2022-10-22 ENCOUNTER — Emergency Department (HOSPITAL_COMMUNITY): Payer: Medicaid Other | Admitting: Anesthesiology

## 2022-10-22 ENCOUNTER — Emergency Department (HOSPITAL_COMMUNITY): Payer: Medicaid Other

## 2022-10-22 ENCOUNTER — Encounter (HOSPITAL_COMMUNITY): Payer: Self-pay | Admitting: *Deleted

## 2022-10-22 ENCOUNTER — Other Ambulatory Visit: Payer: Self-pay

## 2022-10-22 ENCOUNTER — Inpatient Hospital Stay (HOSPITAL_COMMUNITY)
Admission: EM | Admit: 2022-10-22 | Discharge: 2022-10-29 | DRG: 253 | Disposition: A | Discharge status: Home / Self Care | Payer: Medicaid Other | Attending: Vascular Surgery | Admitting: Vascular Surgery

## 2022-10-22 DIAGNOSIS — I70202 Unspecified atherosclerosis of native arteries of extremities, left leg: Secondary | ICD-10-CM | POA: Diagnosis present

## 2022-10-22 DIAGNOSIS — Z7984 Long term (current) use of oral hypoglycemic drugs: Secondary | ICD-10-CM | POA: Diagnosis not present

## 2022-10-22 DIAGNOSIS — I739 Peripheral vascular disease, unspecified: Secondary | ICD-10-CM | POA: Diagnosis not present

## 2022-10-22 DIAGNOSIS — Z9889 Other specified postprocedural states: Secondary | ICD-10-CM | POA: Diagnosis not present

## 2022-10-22 DIAGNOSIS — Q2112 Patent foramen ovale: Secondary | ICD-10-CM

## 2022-10-22 DIAGNOSIS — E1149 Type 2 diabetes mellitus with other diabetic neurological complication: Secondary | ICD-10-CM | POA: Diagnosis not present

## 2022-10-22 DIAGNOSIS — E785 Hyperlipidemia, unspecified: Secondary | ICD-10-CM | POA: Diagnosis present

## 2022-10-22 DIAGNOSIS — R7881 Bacteremia: Secondary | ICD-10-CM | POA: Diagnosis present

## 2022-10-22 DIAGNOSIS — Z8619 Personal history of other infectious and parasitic diseases: Secondary | ICD-10-CM

## 2022-10-22 DIAGNOSIS — Z79899 Other long term (current) drug therapy: Secondary | ICD-10-CM | POA: Diagnosis not present

## 2022-10-22 DIAGNOSIS — Z7982 Long term (current) use of aspirin: Secondary | ICD-10-CM | POA: Diagnosis not present

## 2022-10-22 DIAGNOSIS — E1151 Type 2 diabetes mellitus with diabetic peripheral angiopathy without gangrene: Secondary | ICD-10-CM | POA: Diagnosis present

## 2022-10-22 DIAGNOSIS — I70249 Atherosclerosis of native arteries of left leg with ulceration of unspecified site: Secondary | ICD-10-CM | POA: Diagnosis not present

## 2022-10-22 DIAGNOSIS — Y712 Prosthetic and other implants, materials and accessory cardiovascular devices associated with adverse incidents: Secondary | ICD-10-CM | POA: Diagnosis present

## 2022-10-22 DIAGNOSIS — T8141XA Infection following a procedure, superficial incisional surgical site, initial encounter: Principal | ICD-10-CM

## 2022-10-22 DIAGNOSIS — B9562 Methicillin resistant Staphylococcus aureus infection as the cause of diseases classified elsewhere: Secondary | ICD-10-CM | POA: Diagnosis present

## 2022-10-22 DIAGNOSIS — T827XXA Infection and inflammatory reaction due to other cardiac and vascular devices, implants and grafts, initial encounter: Principal | ICD-10-CM | POA: Diagnosis present

## 2022-10-22 DIAGNOSIS — F17210 Nicotine dependence, cigarettes, uncomplicated: Secondary | ICD-10-CM | POA: Diagnosis present

## 2022-10-22 DIAGNOSIS — Z7902 Long term (current) use of antithrombotics/antiplatelets: Secondary | ICD-10-CM | POA: Diagnosis not present

## 2022-10-22 DIAGNOSIS — L02416 Cutaneous abscess of left lower limb: Secondary | ICD-10-CM | POA: Diagnosis present

## 2022-10-22 DIAGNOSIS — D649 Anemia, unspecified: Secondary | ICD-10-CM | POA: Diagnosis not present

## 2022-10-22 DIAGNOSIS — B954 Other streptococcus as the cause of diseases classified elsewhere: Secondary | ICD-10-CM | POA: Diagnosis present

## 2022-10-22 DIAGNOSIS — Z5982 Transportation insecurity: Secondary | ICD-10-CM

## 2022-10-22 HISTORY — PX: FEMORAL-POPLITEAL BYPASS GRAFT: SHX937

## 2022-10-22 LAB — BLOOD CULTURE ID PANEL (REFLEXED) - BCID2

## 2022-10-22 LAB — I-STAT CHEM 8, ED
BUN: 8 mg/dL (ref 6–20)
Calcium, Ion: 1.13 mmol/L — ABNORMAL LOW (ref 1.15–1.40)
Chloride: 97 mmol/L — ABNORMAL LOW (ref 98–111)
Creatinine, Ser: 0.7 mg/dL (ref 0.61–1.24)
Glucose, Bld: 150 mg/dL — ABNORMAL HIGH (ref 70–99)
HCT: 34 % — ABNORMAL LOW (ref 39.0–52.0)
Hemoglobin: 11.6 g/dL — ABNORMAL LOW (ref 13.0–17.0)
Potassium: 3.8 mmol/L (ref 3.5–5.1)
Sodium: 132 mmol/L — ABNORMAL LOW (ref 135–145)
TCO2: 25 mmol/L (ref 22–32)

## 2022-10-22 LAB — CBC WITH DIFFERENTIAL/PLATELET
Abs Immature Granulocytes: 0.05 10*3/uL (ref 0.00–0.07)
Basophils Absolute: 0.1 10*3/uL (ref 0.0–0.1)
Basophils Relative: 1 %
Eosinophils Absolute: 0 10*3/uL (ref 0.0–0.5)
Eosinophils Relative: 1 %
HCT: 33 % — ABNORMAL LOW (ref 39.0–52.0)
Hemoglobin: 11.3 g/dL — ABNORMAL LOW (ref 13.0–17.0)
Immature Granulocytes: 1 %
Lymphocytes Relative: 13 %
Lymphs Abs: 1.1 10*3/uL (ref 0.7–4.0)
MCH: 31.5 pg (ref 26.0–34.0)
MCHC: 34.2 g/dL (ref 30.0–36.0)
MCV: 91.9 fL (ref 80.0–100.0)
Monocytes Absolute: 0.9 10*3/uL (ref 0.1–1.0)
Monocytes Relative: 10 %
Neutro Abs: 6.6 10*3/uL (ref 1.7–7.7)
Neutrophils Relative %: 74 %
Platelets: 289 10*3/uL (ref 150–400)
RBC: 3.59 MIL/uL — ABNORMAL LOW (ref 4.22–5.81)
RDW: 12 % (ref 11.5–15.5)
WBC: 8.8 10*3/uL (ref 4.0–10.5)
nRBC: 0 % (ref 0.0–0.2)

## 2022-10-22 LAB — GLUCOSE, CAPILLARY: Glucose-Capillary: 107 mg/dL — ABNORMAL HIGH (ref 70–99)

## 2022-10-22 LAB — CBC
HCT: 28.5 % — ABNORMAL LOW (ref 39.0–52.0)
Hemoglobin: 10 g/dL — ABNORMAL LOW (ref 13.0–17.0)
MCH: 31.8 pg (ref 26.0–34.0)
MCHC: 35.1 g/dL (ref 30.0–36.0)
MCV: 90.8 fL (ref 80.0–100.0)
Platelets: 280 10*3/uL (ref 150–400)
RBC: 3.14 MIL/uL — ABNORMAL LOW (ref 4.22–5.81)
RDW: 12.1 % (ref 11.5–15.5)
WBC: 13.5 10*3/uL — ABNORMAL HIGH (ref 4.0–10.5)
nRBC: 0 % (ref 0.0–0.2)

## 2022-10-22 LAB — POCT ACTIVATED CLOTTING TIME
Activated Clotting Time: 223 seconds
Activated Clotting Time: 266 seconds
Activated Clotting Time: 163 seconds

## 2022-10-22 LAB — AEROBIC/ANAEROBIC CULTURE W GRAM STAIN (SURGICAL/DEEP WOUND): Gram Stain: NONE SEEN

## 2022-10-22 LAB — COMPREHENSIVE METABOLIC PANEL
ALT: 21 U/L (ref 0–44)
AST: 23 U/L (ref 15–41)
Albumin: 2.7 g/dL — ABNORMAL LOW (ref 3.5–5.0)
Alkaline Phosphatase: 65 U/L (ref 38–126)
Anion gap: 12 (ref 5–15)
BUN: 8 mg/dL (ref 6–20)
CO2: 23 mmol/L (ref 22–32)
Calcium: 8.7 mg/dL — ABNORMAL LOW (ref 8.9–10.3)
Chloride: 96 mmol/L — ABNORMAL LOW (ref 98–111)
Creatinine, Ser: 0.9 mg/dL (ref 0.61–1.24)
GFR, Estimated: >60 mL/min (ref >60)
Glucose, Bld: 148 mg/dL — ABNORMAL HIGH (ref 70–99)
Potassium: 3.8 mmol/L (ref 3.5–5.1)
Sodium: 131 mmol/L — ABNORMAL LOW (ref 135–145)
Total Bilirubin: 0.8 mg/dL (ref 0.3–1.2)
Total Protein: 7 g/dL (ref 6.5–8.1)

## 2022-10-22 LAB — APTT: aPTT: 43 seconds — ABNORMAL HIGH (ref 24–36)

## 2022-10-22 LAB — PROTIME-INR
INR: 1.2 (ref 0.8–1.2)
Prothrombin Time: 15 seconds (ref 11.4–15.2)

## 2022-10-22 LAB — TYPE AND SCREEN
ABO/RH(D): O POS
Antibody Screen: NEGATIVE

## 2022-10-22 LAB — LACTIC ACID, PLASMA
Lactic Acid, Venous: 1.1 mmol/L (ref 0.5–1.9)
Lactic Acid, Venous: 1.5 mmol/L (ref 0.5–1.9)

## 2022-10-22 LAB — CREATININE, SERUM
Creatinine, Ser: 0.85 mg/dL (ref 0.61–1.24)
GFR, Estimated: >60 mL/min (ref >60)

## 2022-10-22 LAB — HEMOGLOBIN A1C
Hgb A1c MFr Bld: 5.5 % (ref 4.8–5.6)
Mean Plasma Glucose: 111.15 mg/dL

## 2022-10-22 SURGERY — BYPASS GRAFT FEMORAL-POPLITEAL ARTERY
Anesthesia: General | Laterality: Left

## 2022-10-22 MED ORDER — CITALOPRAM HYDROBROMIDE 20 MG PO TABS
20.0000 mg | ORAL_TABLET | Freq: Every day | ORAL | Status: DC
  Administered 2022-10-22 – 2022-10-29 (×8): 20 mg via ORAL
  Filled 2022-10-22 (×8): qty 1

## 2022-10-22 MED ORDER — OXYCODONE HCL 5 MG PO TABS
ORAL_TABLET | ORAL | Status: AC
  Filled 2022-10-22: qty 1

## 2022-10-22 MED ORDER — ATORVASTATIN CALCIUM 40 MG PO TABS
40.0000 mg | ORAL_TABLET | Freq: Every day | ORAL | Status: DC
  Administered 2022-10-22 – 2022-10-29 (×8): 40 mg via ORAL
  Filled 2022-10-22 (×7): qty 1

## 2022-10-22 MED ORDER — PROPOFOL 10 MG/ML IV BOLUS
INTRAVENOUS | Status: AC
  Filled 2022-10-22: qty 20

## 2022-10-22 MED ORDER — OXYCODONE HCL 5 MG PO TABS
5.0000 mg | ORAL_TABLET | Freq: Once | ORAL | Status: AC | PRN
  Administered 2022-10-22: 5 mg via ORAL

## 2022-10-22 MED ORDER — FENTANYL CITRATE (PF) 100 MCG/2ML IJ SOLN
25.0000 ug | INTRAMUSCULAR | Status: DC | PRN
  Administered 2022-10-22 (×4): 25 ug via INTRAVENOUS

## 2022-10-22 MED ORDER — NICOTINE 21 MG/24HR TD PT24
21.0000 mg | MEDICATED_PATCH | Freq: Every day | TRANSDERMAL | Status: DC
  Administered 2022-10-22 – 2022-10-29 (×8): 21 mg via TRANSDERMAL
  Filled 2022-10-22 (×8): qty 1

## 2022-10-22 MED ORDER — HEPARIN 6000 UNIT IRRIGATION SOLUTION
Status: DC | PRN
  Administered 2022-10-22: 1

## 2022-10-22 MED ORDER — ONDANSETRON HCL 4 MG/2ML IJ SOLN
INTRAMUSCULAR | Status: DC | PRN
  Administered 2022-10-22: 4 mg via INTRAVENOUS

## 2022-10-22 MED ORDER — ROCURONIUM BROMIDE 100 MG/10ML IV SOLN
INTRAVENOUS | Status: DC | PRN
  Administered 2022-10-22: 40 mg via INTRAVENOUS
  Administered 2022-10-22: 20 mg via INTRAVENOUS
  Administered 2022-10-22: 80 mg via INTRAVENOUS

## 2022-10-22 MED ORDER — DEXAMETHASONE SODIUM PHOSPHATE 10 MG/ML IJ SOLN
INTRAMUSCULAR | Status: DC | PRN
  Administered 2022-10-22: 5 mg via INTRAVENOUS

## 2022-10-22 MED ORDER — HEPARIN SODIUM (PORCINE) 1000 UNIT/ML IJ SOLN
INTRAMUSCULAR | Status: DC | PRN
  Administered 2022-10-22: 10000 [IU] via INTRAVENOUS

## 2022-10-22 MED ORDER — PHENYLEPHRINE 80 MCG/ML (10ML) SYRINGE FOR IV PUSH (FOR BLOOD PRESSURE SUPPORT)
PREFILLED_SYRINGE | INTRAVENOUS | Status: AC
  Filled 2022-10-22: qty 10

## 2022-10-22 MED ORDER — VANCOMYCIN HCL 2000 MG/400ML IV SOLN
2000.0000 mg | Freq: Once | INTRAVENOUS | Status: AC
  Administered 2022-10-22: 2000 mg via INTRAVENOUS
  Filled 2022-10-22: qty 400

## 2022-10-22 MED ORDER — CHLORHEXIDINE GLUCONATE 0.12 % MT SOLN
15.0000 mL | Freq: Once | OROMUCOSAL | Status: AC
  Administered 2022-10-22: 15 mL via OROMUCOSAL

## 2022-10-22 MED ORDER — EPHEDRINE SULFATE-NACL 50-0.9 MG/10ML-% IV SOSY: PREFILLED_SYRINGE | INTRAVENOUS | Status: DC | PRN

## 2022-10-22 MED ORDER — ONDANSETRON HCL 4 MG/2ML IJ SOLN: 4.0000 mg | Freq: Four times a day (QID) | INTRAMUSCULAR | Status: DC | PRN

## 2022-10-22 MED ORDER — DEXMEDETOMIDINE HCL IN NACL 80 MCG/20ML IV SOLN
INTRAVENOUS | Status: DC | PRN
  Administered 2022-10-22: 12 ug via BUCCAL

## 2022-10-22 MED ORDER — DEXAMETHASONE SODIUM PHOSPHATE 10 MG/ML IJ SOLN
INTRAMUSCULAR | Status: AC
  Filled 2022-10-22: qty 1

## 2022-10-22 MED ORDER — CEFAZOLIN SODIUM-DEXTROSE 2-4 GM/100ML-% IV SOLN
INTRAVENOUS | Status: AC
  Filled 2022-10-22: qty 100

## 2022-10-22 MED ORDER — VANCOMYCIN HCL IN DEXTROSE 1-5 GM/200ML-% IV SOLN: 1000.0000 mg | Freq: Two times a day (BID) | INTRAVENOUS | Status: DC

## 2022-10-22 MED ORDER — MIDAZOLAM HCL 2 MG/2ML IJ SOLN
INTRAMUSCULAR | Status: AC
  Filled 2022-10-22: qty 2

## 2022-10-22 MED ORDER — DEXTROSE IN LACTATED RINGERS 5 % IV SOLN
INTRAVENOUS | Status: AC | PRN
  Administered 2022-10-22: 1000 mL via INTRAVENOUS

## 2022-10-22 MED ORDER — PHENYLEPHRINE HCL-NACL 20-0.9 MG/250ML-% IV SOLN
INTRAVENOUS | Status: DC | PRN
  Administered 2022-10-22: 50 ug/min via INTRAVENOUS

## 2022-10-22 MED ORDER — IOHEXOL 350 MG/ML SOLN
120.0000 mL | Freq: Once | INTRAVENOUS | Status: AC | PRN
  Administered 2022-10-22: 120 mL via INTRAVENOUS

## 2022-10-22 MED ORDER — SUGAMMADEX SODIUM 200 MG/2ML IV SOLN
INTRAVENOUS | Status: DC | PRN
  Administered 2022-10-22: 200 mg via INTRAVENOUS

## 2022-10-22 MED ORDER — ACETAMINOPHEN 160 MG/5ML PO SOLN: 1000.0000 mg | Freq: Once | ORAL | Status: DC | PRN

## 2022-10-22 MED ORDER — LACTATED RINGERS IV SOLN: INTRAVENOUS | Status: DC

## 2022-10-22 MED ORDER — CLOPIDOGREL BISULFATE 75 MG PO TABS
75.0000 mg | ORAL_TABLET | Freq: Every day | ORAL | Status: DC
  Administered 2022-10-23 – 2022-10-29 (×7): 75 mg via ORAL
  Filled 2022-10-22 (×8): qty 1

## 2022-10-22 MED ORDER — ASPIRIN 81 MG PO TBEC
81.0000 mg | DELAYED_RELEASE_TABLET | Freq: Every day | ORAL | Status: DC
  Administered 2022-10-22 – 2022-10-29 (×8): 81 mg via ORAL
  Filled 2022-10-22 (×8): qty 1

## 2022-10-22 MED ORDER — INSULIN ASPART 100 UNIT/ML IJ SOLN
0.0000 [IU] | Freq: Three times a day (TID) | INTRAMUSCULAR | Status: DC
  Administered 2022-10-23 (×2): 3 [IU] via SUBCUTANEOUS
  Administered 2022-10-23: 8 [IU] via SUBCUTANEOUS
  Administered 2022-10-24 (×2): 2 [IU] via SUBCUTANEOUS
  Administered 2022-10-24 – 2022-10-26 (×3): 3 [IU] via SUBCUTANEOUS
  Administered 2022-10-26 – 2022-10-27 (×2): 2 [IU] via SUBCUTANEOUS
  Administered 2022-10-28: 3 [IU] via SUBCUTANEOUS
  Administered 2022-10-28: 2 [IU] via SUBCUTANEOUS

## 2022-10-22 MED ORDER — LACTATED RINGERS IV SOLN: INTRAVENOUS | Status: DC | PRN

## 2022-10-22 MED ORDER — OXYCODONE-ACETAMINOPHEN 5-325 MG PO TABS
ORAL_TABLET | ORAL | Status: AC
  Filled 2022-10-22: qty 2

## 2022-10-22 MED ORDER — LIDOCAINE HCL (CARDIAC) PF 100 MG/5ML IV SOSY
PREFILLED_SYRINGE | INTRAVENOUS | Status: DC | PRN
  Administered 2022-10-22: 60 mg via INTRATRACHEAL

## 2022-10-22 MED ORDER — MIDAZOLAM HCL 2 MG/2ML IJ SOLN
INTRAMUSCULAR | Status: DC | PRN
  Administered 2022-10-22: 2 mg via INTRAVENOUS

## 2022-10-22 MED ORDER — PANTOPRAZOLE SODIUM 40 MG PO TBEC
40.0000 mg | DELAYED_RELEASE_TABLET | Freq: Every day | ORAL | Status: DC
  Administered 2022-10-22 – 2022-10-29 (×8): 40 mg via ORAL
  Filled 2022-10-22 (×8): qty 1

## 2022-10-22 MED ORDER — FENTANYL CITRATE (PF) 250 MCG/5ML IJ SOLN
INTRAMUSCULAR | Status: AC
  Filled 2022-10-22: qty 5

## 2022-10-22 MED ORDER — ACETAMINOPHEN 10 MG/ML IV SOLN
INTRAVENOUS | Status: AC
  Filled 2022-10-22: qty 100

## 2022-10-22 MED ORDER — HYDROMORPHONE HCL 1 MG/ML IJ SOLN
0.5000 mg | INTRAMUSCULAR | Status: DC | PRN
  Administered 2022-10-22: 1 mg via INTRAVENOUS
  Administered 2022-10-23: 0.5 mg via INTRAVENOUS
  Administered 2022-10-23 (×2): 1 mg via INTRAVENOUS
  Administered 2022-10-23: 0.5 mg via INTRAVENOUS
  Administered 2022-10-24: 1 mg via INTRAVENOUS
  Filled 2022-10-22 (×2): qty 1
  Filled 2022-10-22: qty 0.5
  Filled 2022-10-22: qty 1
  Filled 2022-10-22: qty 0.5

## 2022-10-22 MED ORDER — GABAPENTIN 300 MG PO CAPS
300.0000 mg | ORAL_CAPSULE | Freq: Two times a day (BID) | ORAL | Status: DC
  Administered 2022-10-22 – 2022-10-29 (×14): 300 mg via ORAL
  Filled 2022-10-22 (×14): qty 1

## 2022-10-22 MED ORDER — HEPARIN SODIUM (PORCINE) 1000 UNIT/ML IJ SOLN
INTRAMUSCULAR | Status: AC
  Filled 2022-10-22: qty 10

## 2022-10-22 MED ORDER — HYDROMORPHONE HCL 1 MG/ML IJ SOLN
INTRAMUSCULAR | Status: AC
  Filled 2022-10-22: qty 1

## 2022-10-22 MED ORDER — ACETAMINOPHEN 10 MG/ML IV SOLN
1000.0000 mg | Freq: Once | INTRAVENOUS | Status: DC | PRN
  Administered 2022-10-22: 1000 mg via INTRAVENOUS

## 2022-10-22 MED ORDER — PHENYLEPHRINE HCL (PRESSORS) 10 MG/ML IV SOLN
INTRAVENOUS | Status: DC | PRN
  Administered 2022-10-22: 80 ug via INTRAVENOUS
  Administered 2022-10-22: 160 ug via INTRAVENOUS

## 2022-10-22 MED ORDER — ONDANSETRON HCL 4 MG/2ML IJ SOLN
INTRAMUSCULAR | Status: AC
  Filled 2022-10-22: qty 2

## 2022-10-22 MED ORDER — PROPOFOL 10 MG/ML IV BOLUS
INTRAVENOUS | Status: DC | PRN
  Administered 2022-10-22: 120 mg via INTRAVENOUS
  Administered 2022-10-22: 40 mg via INTRAVENOUS

## 2022-10-22 MED ORDER — FENTANYL CITRATE (PF) 250 MCG/5ML IJ SOLN
INTRAMUSCULAR | Status: DC | PRN
  Administered 2022-10-22 (×2): 50 ug via INTRAVENOUS
  Administered 2022-10-22 (×2): 25 ug via INTRAVENOUS
  Administered 2022-10-22: 100 ug via INTRAVENOUS

## 2022-10-22 MED ORDER — MORPHINE SULFATE (PF) 4 MG/ML IV SOLN
4.0000 mg | Freq: Once | INTRAVENOUS | Status: AC
  Administered 2022-10-22: 4 mg via INTRAVENOUS
  Filled 2022-10-22: qty 1

## 2022-10-22 MED ORDER — OXYCODONE-ACETAMINOPHEN 5-325 MG PO TABS
1.0000 | ORAL_TABLET | ORAL | Status: DC | PRN
  Administered 2022-10-23 – 2022-10-25 (×5): 2 via ORAL
  Administered 2022-10-26 (×2): 1 via ORAL
  Administered 2022-10-27 – 2022-10-29 (×8): 2 via ORAL
  Filled 2022-10-22: qty 2
  Filled 2022-10-22: qty 1
  Filled 2022-10-22 (×10): qty 2
  Filled 2022-10-22: qty 1
  Filled 2022-10-22 (×2): qty 2

## 2022-10-22 MED ORDER — PHENYLEPHRINE 80 MCG/ML (10ML) SYRINGE FOR IV PUSH (FOR BLOOD PRESSURE SUPPORT)
PREFILLED_SYRINGE | INTRAVENOUS | Status: DC | PRN
  Administered 2022-10-22: 80 ug via INTRAVENOUS

## 2022-10-22 MED ORDER — 0.9 % SODIUM CHLORIDE (POUR BTL) OPTIME
TOPICAL | Status: DC | PRN
  Administered 2022-10-22: 2000 mL

## 2022-10-22 MED ORDER — FENTANYL CITRATE (PF) 100 MCG/2ML IJ SOLN
INTRAMUSCULAR | Status: AC
  Filled 2022-10-22: qty 2

## 2022-10-22 MED ORDER — PROTAMINE SULFATE 10 MG/ML IV SOLN
INTRAVENOUS | Status: DC | PRN
  Administered 2022-10-22: 50 mg via INTRAVENOUS

## 2022-10-22 MED ORDER — ACETAMINOPHEN 500 MG PO TABS: 1000.0000 mg | ORAL_TABLET | Freq: Once | ORAL | Status: DC | PRN

## 2022-10-22 MED ORDER — ORAL CARE MOUTH RINSE: 15.0000 mL | Freq: Once | OROMUCOSAL | Status: AC

## 2022-10-22 MED ORDER — CEFAZOLIN SODIUM-DEXTROSE 2-4 GM/100ML-% IV SOLN
2.0000 g | Freq: Three times a day (TID) | INTRAVENOUS | Status: AC
  Administered 2022-10-22 – 2022-10-23 (×2): 2 g via INTRAVENOUS
  Filled 2022-10-22 (×2): qty 100

## 2022-10-22 MED ORDER — PANTOPRAZOLE SODIUM 40 MG PO TBEC: 40.0000 mg | DELAYED_RELEASE_TABLET | Freq: Every day | ORAL | Status: DC

## 2022-10-22 MED ORDER — OXYCODONE HCL 5 MG/5ML PO SOLN: 5.0000 mg | Freq: Once | ORAL | Status: AC | PRN

## 2022-10-22 MED ORDER — OMEGA-3-ACID ETHYL ESTERS 1 G PO CAPS
1.0000 g | ORAL_CAPSULE | Freq: Two times a day (BID) | ORAL | Status: DC
  Administered 2022-10-22 – 2022-10-29 (×14): 1 g via ORAL
  Filled 2022-10-22 (×14): qty 1

## 2022-10-22 MED ORDER — HEPARIN 6000 UNIT IRRIGATION SOLUTION
Status: AC
  Filled 2022-10-22: qty 500

## 2022-10-22 SURGICAL SUPPLY — 73 items
APL PRP STRL LF DISP 70% ISPRP (MISCELLANEOUS) ×2
BAG COUNTER SPONGE SURGICOUNT (BAG) ×1 IMPLANT
BAG SPNG CNTER NS LX DISP (BAG) ×1
BANDAGE ESMARK 6X9 LF (GAUZE/BANDAGES/DRESSINGS) IMPLANT
BNDG CMPR 5X4 KNIT ELC UNQ LF (GAUZE/BANDAGES/DRESSINGS) ×1
BNDG CMPR 5X62 HK CLSR LF (GAUZE/BANDAGES/DRESSINGS) ×1
BNDG CMPR 9X6 STRL LF SNTH (GAUZE/BANDAGES/DRESSINGS)
BNDG ELASTIC 4INX 5YD STR LF (GAUZE/BANDAGES/DRESSINGS) IMPLANT
BNDG ELASTIC 6INX 5YD STR LF (GAUZE/BANDAGES/DRESSINGS) IMPLANT
BNDG ESMARK 6X9 LF (GAUZE/BANDAGES/DRESSINGS)
BNDG GAUZE DERMACEA FLUFF 4 (GAUZE/BANDAGES/DRESSINGS) IMPLANT
BNDG GZE DERMACEA 4 6PLY (GAUZE/BANDAGES/DRESSINGS) ×1
CANISTER SUCT 3000ML PPV (MISCELLANEOUS) ×1 IMPLANT
CANNULA VESSEL 3MM 2 BLNT TIP (CANNULA) ×2 IMPLANT
CHLORAPREP W/TINT 26 (MISCELLANEOUS) ×2 IMPLANT
CLIP LIGATING EXTRA MED SLVR (CLIP) IMPLANT
CLIP LIGATING EXTRA SM BLUE (MISCELLANEOUS) IMPLANT
CRYO VEIN SAPHENOUS (Tissue) ×1 IMPLANT
CUFF TOURN SGL QUICK 24 (TOURNIQUET CUFF) ×1
CUFF TOURN SGL QUICK 34 (TOURNIQUET CUFF)
CUFF TOURN SGL QUICK 42 (TOURNIQUET CUFF) IMPLANT
CUFF TRNQT CYL 24X4X16.5-23 (TOURNIQUET CUFF) IMPLANT
CUFF TRNQT CYL 34X4.125X (TOURNIQUET CUFF) IMPLANT
DRAIN CHANNEL 15F RND FF W/TCR (WOUND CARE) IMPLANT
DRAIN PENROSE 1/2X36 LTX STRL (DRAIN) IMPLANT
DRAIN PENROSE 12X.25 LTX STRL (MISCELLANEOUS) IMPLANT
DRAPE C-ARM 42X72 X-RAY (DRAPES) IMPLANT
DRAPE HALF SHEET 40X57 (DRAPES) IMPLANT
DRAPE X-RAY CASS 24X20 (DRAPES) IMPLANT
ELECT REM PT RETURN 9FT ADLT (ELECTROSURGICAL) ×1
ELECTRODE REM PT RTRN 9FT ADLT (ELECTROSURGICAL) ×1 IMPLANT
EVACUATOR SILICONE 100CC (DRAIN) IMPLANT
GAUZE PAD ABD 8X10 STRL (GAUZE/BANDAGES/DRESSINGS) IMPLANT
GAUZE SPONGE 4X4 12PLY STRL (GAUZE/BANDAGES/DRESSINGS) ×1 IMPLANT
GLOVE BIO SURGEON STRL SZ8 (GLOVE) ×1 IMPLANT
GOWN STRL REUS W/ TWL LRG LVL3 (GOWN DISPOSABLE) ×2 IMPLANT
GOWN STRL REUS W/ TWL XL LVL3 (GOWN DISPOSABLE) ×1 IMPLANT
GOWN STRL REUS W/TWL LRG LVL3 (GOWN DISPOSABLE) ×2
GOWN STRL REUS W/TWL XL LVL3 (GOWN DISPOSABLE) ×1
GRAFT VASC ANGIOGRAFT 71-80 (Tissue) IMPLANT
HEAD CUTTING VALVULOTOME LEMTR (VASCULAR PRODUCTS) IMPLANT
INSERT FOGARTY SM (MISCELLANEOUS) IMPLANT
KIT BASIN OR (CUSTOM PROCEDURE TRAY) ×1 IMPLANT
KIT TURNOVER KIT B (KITS) ×1 IMPLANT
MARKER GRAFT CORONARY BYPASS (MISCELLANEOUS) IMPLANT
NS IRRIG 1000ML POUR BTL (IV SOLUTION) ×2 IMPLANT
PACK PERIPHERAL VASCULAR (CUSTOM PROCEDURE TRAY) ×1 IMPLANT
PAD ARMBOARD 7.5X6 YLW CONV (MISCELLANEOUS) ×2 IMPLANT
PADDING CAST COTTON 6X4 STRL (CAST SUPPLIES) IMPLANT
SET COLLECT BLD 21X3/4 12 (NEEDLE) IMPLANT
SET WALTER ACTIVATION W/DRAPE (SET/KITS/TRAYS/PACK) IMPLANT
STAPLER VISISTAT 35W (STAPLE) IMPLANT
STOPCOCK 4 WAY LG BORE MALE ST (IV SETS) IMPLANT
SUT ETHILON 2 0 PSLX (SUTURE) IMPLANT
SUT ETHILON 3 0 PS 1 (SUTURE) IMPLANT
SUT MNCRL AB 4-0 PS2 18 (SUTURE) ×2 IMPLANT
SUT PROLENE 5 0 C 1 24 (SUTURE) ×1 IMPLANT
SUT PROLENE 6 0 BV (SUTURE) ×1 IMPLANT
SUT SILK 2 0 SH (SUTURE) ×1 IMPLANT
SUT SILK 3 0 (SUTURE)
SUT SILK 3-0 18XBRD TIE 12 (SUTURE) IMPLANT
SUT VIC AB 2-0 CT1 27 (SUTURE) ×2
SUT VIC AB 2-0 CT1 TAPERPNT 27 (SUTURE) ×2 IMPLANT
SUT VIC AB 3-0 SH 27 (SUTURE) ×2
SUT VIC AB 3-0 SH 27X BRD (SUTURE) ×2 IMPLANT
TAPE UMBILICAL 1/8X30 (MISCELLANEOUS) IMPLANT
TOWEL GREEN STERILE (TOWEL DISPOSABLE) ×1 IMPLANT
TRAY FOLEY MTR SLVR 16FR STAT (SET/KITS/TRAYS/PACK) ×1 IMPLANT
UNDERPAD 30X36 HEAVY ABSORB (UNDERPADS AND DIAPERS) ×1 IMPLANT
VALVULOTOME HEAD CUTTING LEMTR (VASCULAR PRODUCTS) IMPLANT
VALVULOTOME LEMAITRE (VASCULAR PRODUCTS)
VEIN 'CRYO SAPHENOUS (Tissue) ×1 IMPLANT
WATER STERILE IRR 1000ML POUR (IV SOLUTION) ×1 IMPLANT

## 2022-10-22 NOTE — ED Provider Notes (Signed)
[place] EMERGENCY DEPARTMENT AT [facility] Provider Note   CSN: [id number] Arrival date & time: 10/22/22  4098     History  Chief Complaint  Patient presents with   Post-op Problem    Tony Hicks is a 57 y.o. male.  Patient presents to the urgency department for evaluation of leg pain.  Patient reports that he had bypass surgery in his leg performed on April 9.  He has been having pain since the surgery.  Initially pain was at the incision site at the lower part of the left leg but now the entire leg has been painful, swollen and is warm to the touch.  He also has been noticing drainage from the incision site at the lower portion of the leg.  He has not noticed any fever.       Home Medications Prior to Admission medications   Medication Sig Start Date End Date Taking? Authorizing Provider  aspirin EC 81 MG tablet Take 1 tablet (81 mg total) by mouth daily. Swallow whole. 05/14/22   [name], NP  atorvastatin (LIPITOR) 40 MG tablet Take 1 tablet (40 mg total) by mouth daily. 09/19/22 03/18/23  [name], NP  citalopram (CELEXA) 20 MG tablet Take 1 tablet (20 mg total) by mouth daily. 09/19/22   [name], NP  clopidogrel (PLAVIX) 75 MG tablet Take 1 tablet (75 mg total) by mouth daily. 09/19/22   [name], NP  ferrous sulfate 325 (65 FE) MG tablet Take 1 tablet (325 mg total) by mouth daily with breakfast. 09/19/22   [name], NP  gabapentin (NEURONTIN) 300 MG capsule Take 1 capsule (300 mg total) by mouth 2 (two) times daily. 09/19/22   [name], NP  metFORMIN (GLUCOPHAGE) 500 MG tablet Take 1 tablet (500 mg total) by mouth daily with breakfast. 09/19/22   [name], NP  nicotine (NICODERM CQ) 21 mg/24hr patch Place 1 patch (21 mg total) onto the skin daily. 09/19/22   [name], NP  omega-3 acid ethyl esters (LOVAZA) 1 g capsule Take 1 capsule (1 g total) by mouth 2 (two) times daily. 09/22/22   [name],  NP  oxyCODONE-acetaminophen (PERCOCET) 5-325 MG tablet Take 1 tablet by mouth every 6 (six) hours as needed for severe pain. 10/11/22 10/11/23  Schuh, McKenzi P, PA-C  pantoprazole (PROTONIX) 40 MG tablet Take 1 tablet (40 mg total) by mouth daily. 09/19/22   [name], NP      Allergies    Patient has no known allergies.    Review of Systems   Review of Systems  Physical Exam Updated Vital Signs BP 122/71   Pulse 81   Temp 98.1 F (36.7 C) (Oral)   Resp 19   SpO2 94%  Physical Exam Vitals and nursing note reviewed.  Constitutional:      General: He is not in acute distress.    Appearance: He is well-developed.  HENT:     Head: Normocephalic and atraumatic.     Mouth/Throat:     Mouth: Mucous membranes are moist.  Eyes:     General: Vision grossly intact. Gaze aligned appropriately.     Extraocular Movements: Extraocular movements intact.     Conjunctiva/sclera: Conjunctivae normal.  Cardiovascular:     Rate and Rhythm: Normal rate and regular rhythm.     Pulses:          Dorsalis pedis pulses are detected w/ Doppler  on the left side.     Heart sounds: Normal heart sounds, S1 normal and S2 normal. No murmur heard.    No friction rub. No gallop.  Pulmonary:     Effort: Pulmonary effort is normal. No respiratory distress.     Breath sounds: Normal breath sounds.  Abdominal:     Palpations: Abdomen is soft.     Tenderness: There is no abdominal tenderness. There is no guarding or rebound.     Hernia: No hernia is present.  Musculoskeletal:        General: No swelling.     Cervical back: Full passive range of motion without pain, normal range of motion and neck supple. No pain with movement, spinous process tenderness or muscular tenderness. Normal range of motion.     Right lower leg: No edema.     Left lower leg: Swelling and tenderness present. No edema.  Skin:    General: Skin is warm and dry.     Capillary Refill: Capillary refill takes less than 2 seconds.      Findings: Wound (Medial incision at distal left lower leg purulent drainage) present. No ecchymosis, erythema or lesion.  Neurological:     Mental Status: He is alert and oriented to person, place, and time.     GCS: GCS eye subscore is 4. GCS verbal subscore is 5. GCS motor subscore is 6.     Cranial Nerves: Cranial nerves 2-12 are intact.     Sensory: Sensation is intact.     Motor: Motor function is intact. No weakness or abnormal muscle tone.     Coordination: Coordination is intact.  Psychiatric:        Mood and Affect: Mood normal.        Speech: Speech normal.        Behavior: Behavior normal.     ED Results / Procedures / Treatments   Labs (all labs ordered are listed, but only abnormal results are displayed) Labs Reviewed  COMPREHENSIVE METABOLIC PANEL - Abnormal; Notable for the following components:      Result Value   Sodium 131 (*)    Chloride 96 (*)    Glucose, Bld 148 (*)    Calcium 8.7 (*)    Albumin 2.7 (*)    All other components within normal limits  CBC WITH DIFFERENTIAL/PLATELET - Abnormal; Notable for the following components:   RBC 3.59 (*)    Hemoglobin 11.3 (*)    HCT 33.0 (*)    All other components within normal limits  APTT - Abnormal; Notable for the following components:   aPTT 43 (*)    All other components within normal limits  I-STAT CHEM 8, ED - Abnormal; Notable for the following components:   Sodium 132 (*)    Chloride 97 (*)    Glucose, Bld 150 (*)    Calcium, Ion 1.13 (*)    Hemoglobin 11.6 (*)    HCT 34.0 (*)    All other components within normal limits  CULTURE, BLOOD (ROUTINE X 2)  CULTURE, BLOOD (ROUTINE X 2)  LACTIC ACID, PLASMA  PROTIME-INR  LACTIC ACID, PLASMA    EKG EKG Interpretation  Date/Time:  Wednesday October 22 2022 05:43:09 EDT Ventricular Rate:  82 PR Interval:  136 QRS Duration: 88 QT Interval:  373 QTC Calculation: 436 R Axis:   63 Text Interpretation: Sinus rhythm Normal ECG Confirmed by Gilda Crease [phone]) on 10/22/2022 5:44:22 AM  Radiology No results found.  Procedures  Procedures    Medications Ordered in ED Medications - No data to display  ED Course/ Medical Decision Making/ A&P                             Medical Decision Making Amount and/or Complexity of Data Reviewed External Data Reviewed: notes. Labs: ordered. Decision-making details documented in ED Course. ECG/medicine tests: ordered.   Patient presents to the emergency for evaluation of leg pain.  Patient had a redo of left lower extremity bypass on April 9.  Patient has had pain at the distal part of his leg that has progressively worsened and now the pain is coming up his entire leg.  He has noticed drainage from the incision on the medial aspect of his distal leg with swelling of the entire leg.  Examination concerning for postoperative infection.  Additionally, however, pulses are not palpable.  He does have a pulse with Doppler at bedside.  Patient was afebrile at arrival.  He does not have a leukocytosis or elevated lactic acid.  No tachycardia, so he does not have SIRS criteria or sepsis.  Attempting to contact vascular surgery to evaluate the patient.  Will initiate vancomycin for purulent surgical incision infection.  Will sign to oncoming ER physician to follow-up with vascular surgery.        Final Clinical Impression(s) / ED Diagnoses Final diagnoses:  Infection of superficial incisional surgical site after procedure, initial encounter    Rx / DC Orders ED Discharge Orders     None         Gilda Crease, MD 10/22/22 [phone]

## 2022-10-22 NOTE — Op Note (Signed)
DATE OF SERVICE: 10/22/2022  PATIENT:  Tony Hicks  57 y.o. 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 My assistant helped with exposure through counter tension, suctioning, ligation and retraction to better visualize the surgical field.  My assistant expedited sewing during the case by following my sutures. Wherever I use the term "we" in the report, my assistant actively helped me with that portion of the procedure.  ANESTHESIA:   general  EBL:  BLOOD ADMINISTERED:none  DRAINS: none   LOCAL MEDICATIONS USED:  NONE  SPECIMEN:  none  COUNTS: confirmed correct.  TOURNIQUET:  none  PATIENT DISPOSITION:  PACU - hemodynamically stable.   Delay start of Pharmacological VTE agent (>24hrs) due to surgical blood loss or risk of bleeding: no  INDICATION FOR PROCEDURE: ANTIONNE Hicks is a 57 y.o. male with infected left superficial femoral to posterior tibial artery bypass.  This was placed about 2 weeks ago (10/08/2022). After careful discussion of risks, benefits, and alternatives the patient was offered excision of the bypass and replacement with cadaveric greater saphenous vein. We specifically discussed the risk of limb loss. The patient understood and wished to proceed.  OPERATIVE FINDINGS: Gross infection in the tunnel tract involving the prosthetic bypass.  Superficial femoral artery controlled without difficulty.  Posterior tibial artery controlled initially  with tourniquet, and then with clamps.  A new tunnel was created with a Kelly-Wick tunneler to limit potential for infection of the new graft.  A counterincision was made over the old tunnel track and Penrose drains x 2 were placed through the tunnel tract to encourage drainage.  Culture and Gram stain were taken of the fields as well as the bypass graft.  The wounds were closed loosely to facilitate drainage.  DESCRIPTION OF PROCEDURE: After identification of the patient in the pre-operative holding area, the patient was transferred to the operating room. The patient was positioned supine on the operating room table. Anesthesia was induced. The left leg was prepped and draped in standard fashion. A surgical pause was performed confirming correct patient, procedure, and operative location.  The previous incision at the thigh was opened sharply with a 10 blade.  Suture material was removed sharply with the Metzenbaum.  I was able to identify the superficial femoral vascular bundle fairly quickly.  Purulence was noted.  Cultures were taken.  The bypass graft was traced back to its proximal anastomosis.  We skeletonized the superficial femoral artery proximally distally.  Clamps were applied to the superficial femoral artery proximally and distally.  The graft was completely excised off its proximal anastomosis.  A pneumatic tourniquet was applied to the calf.  The leg was exsanguinated with an Esmarch tourniquet.  The pneumatic tourniquet was inflated.  The time was noted.  The distal incision was reopened sharply.  The incision was carried down through the suture material.  The prostatic vascular graft was identified.  Gross purulence was noted.  The posterior tibial artery was identified.  The  graft was taken off its anastomosis.  It was noted to already be partially dehisced.  After the graft was completely removed we skeletonized the posterior tibial artery proximally and distally and placed bulldog clamps  to these arteries.  The pneumatic tourniquet was released.  The pneumatic tourniquet was taken off the field.  Hemostasis was achieved.  The prosthetic material was completely removed.  This was placed in a specimen cup for culture and Gram stain.  A counterincision was made in the skin about the knee.  A Penrose drain was delivered through the tunnel tract from the proximal exposure to this counterincision, and from the distal exposure to this incision.  A total of 2 Penrose drains were placed.  These were sutured to themselves using silk suture.  The patient was systemically heparinized.  Activated clotting time measurements were used at the case to confirm adequate anticoagulation.  A cadaveric greater saphenous vein was prepared per the manufacturer's instructions and brought onto the field.  This was configured in a reversed configuration.  A new tunnel was created posterior to the existing subcutaneous tunnel to limit the chance of infecting this bypass graft.  I performed the proximal anastomosis.    The proximal anastomosis was performed using the existing arteriotomy.  The proximal aspect of the vascular graft was spatulated to allow end-to-side anastomosis.  The proximal anastomosis was performed using continuous running suture of 6-0 Prolene.  After completion the anastomosis was flushed on the open end of the vascular graft.  Good flow was noted.  Good hemostasis was achieved in the anastomosis.    This anastomosis was performed separately and simultaneously.  Dr. Chestine Spore will dictate this separately.  After he completed the distal anastomosis clamps were released on the bypass graft.  The bypass appeared to work very well.  Hemostasis was confirmed in the distal anastomosis.  The surgical bed was used the, due to diffuse inflammation.  The bypass graft was interrogated with Doppler machine.  Good Doppler flow was noted distal to the bypass.  This severely attenuated with occlusion of the  bypass.  Satisfied manage case here.  Heparin was reversed with protamine.  Diffuse ooze was noted throughout the surgical beds.  The wounds were closed loosely using 2-0 Vicryl, surgical stapler, and 2-0 nylon.  The leg was wrapped.  Upon completion of the case instrument and sharps counts were confirmed correct. The patient was transferred to the PACU in good condition.  Dr. Chestine Spore and I were present for all portions of the procedure.  Rande Brunt. Lenell Antu, MD [facility] Phone Number: [phone] 10/22/2022 5:44 PM

## 2022-10-22 NOTE — Progress Notes (Signed)
Patient arrived to the floor, alert and oriented to the floor, staff and equipment. Cardiac monitoring initiated, CCMD notified. VSS we'll continue to monitor.

## 2022-10-22 NOTE — Anesthesia Postprocedure Evaluation (Signed)
Anesthesia Post Note  Patient: Tony Hicks  Procedure(s) Performed: LEFT LEG REDO SUPERFICAL SAPHENOUS VEIN TO CRYO SAPHENOUS VEIN BYPASS W/ GRAFT EXCISION (Left)     Patient location during evaluation: PACU Anesthesia Type: General Level of consciousness: awake and alert Pain management: pain level controlled Vital Signs Assessment: post-procedure vital signs reviewed and stable Respiratory status: spontaneous breathing, nonlabored ventilation, respiratory function stable and patient connected to nasal cannula oxygen Cardiovascular status: blood pressure returned to baseline and stable Postop Assessment: no apparent nausea or vomiting Anesthetic complications: no   No notable events documented.  Last Vitals:  Vitals:   10/22/22 2000 10/22/22 2030  BP: 133/73 (!) 151/78  Pulse: 77 81  Resp: 20 18  Temp:    SpO2: 92% 92%    Last Pain:  Vitals:   10/22/22 2030  TempSrc:   PainSc: 4                  Collene Schlichter

## 2022-10-22 NOTE — Anesthesia Procedure Notes (Signed)
Arterial Line Insertion Start/End4/17/2024 3:42 PM, 10/22/2022 3:50 PM Performed by: Val Eagle, MD, anesthesiologist  Patient location: Pre-op. Preanesthetic checklist: patient identified, IV checked, risks and benefits discussed, surgical consent, monitors and equipment checked, pre-op evaluation and anesthesia consent Lidocaine 1% used for infiltration Right, radial was placed Catheter size: 20 G Hand hygiene performed  and maximum sterile barriers used   Attempts: 1 Procedure performed without using ultrasound guided technique. Following insertion, dressing applied, line sutured and Biopatch. Post procedure assessment: normal and unchanged  Patient tolerated the procedure well with no immediate complications.

## 2022-10-22 NOTE — Op Note (Signed)
Date: October 22, 2022  Preoperative diagnosis: Infected left SFA to posterior tibial artery PTFE bypass in setting of critical limb ischemia with tissue loss  Postoperative diagnosis: Same  Procedure: 1.  Excision of left superficial femoral artery to posterior tibial artery PTFE bypass 2.  Redo left superficial femoral artery to posterior tibial bypass with cadaveric cryo great saphenous vein 3.  Placement of Penrose drains in previous saphenectomy tunnel  Surgeon: Dr. Cephus Shelling, MD  Co-surgeon: Dr. Heath Lark, MD  Indications: 57 year old male that recently underwent a left SFA to posterior tibial bypass with PTFE on 10/08/22 for critical limb ischemia with tissue loss.  This was a redo bypass as his previous vein graft thrombosed.  He presented to the ED this morning with evidence of purulent drainage from the bypass incisions in his left leg with evidence of infection.  He presents for graft excision and likely redo bypass with CryoVein after risk benefits discussed.  Findings: There was purulent drainage throughout the bypass tunnel including at the proximal and distal anastomosis.  Cultures were sent.  The PTFE graft was completely excised.  We placed Penrose drains in the previous saphenectomy tunnels.  New subcutaneous tunnel was made along the medial calf and thigh and a new SFA to posterior tibial bypass was performed with cadaveric vein with excellent PT signal at the ankle.   Anesthesia: General  Details: Patient was taken to the operating room after informed consent was obtained.  Placed on the operative table in the supine position.  General endotracheal anesthesia was induced.  Antibiotics were given and timeout performed.  Initially the previous incisions were reopened at the mid SFA and PT at the ankle.  There was purulent drainage in both locations.  Multiple cultures were sent in the operating room.  Dr. Lenell Antu disconnected the proximal and distal bypass  anastomosis after getting vascular control and the previous PTFE graft was removed.  We then created a new subcutaneous tunnel from the distal posterior tibial artery exposure up to the mid thigh at the SFA with a Gore tunneler.  We then brought a CryoVein on the field and this was marked for orientation.  Was passed through the tunnel making sure not to twist it.  We ensured that we had good one-to-one tension.  We made a counterincision in the proximal calf at the previous site of PTFE graft that was tunneled.  We then placed Penrose drains both proximal and distal through the counterincision to allow adequate drainage of the infected bypass graft that had been removed.  The patient was heparinized with 100 units/kg IV heparin.  Dr. Lenell Antu sewed the proximal anastomosis to the SFA while I sewed the distal anastomosis with cadaveric vein to the posterior tibial artery.  The posterior tibial artery was controlled with angled bulldog clamps.  The graft was spatulated.  I used a 6-0 Prolene parachute technique and the graft was sewn end to side to the posterior tibial artery at the ankle.  This was de-aired prior to completion.  We had excellent posterior tibial signal at the ankle.  All the incisions and wounds were copiously irrigated.  We then elected to close the distal incision with interrupted nylon and staples.  His leg was then wrapped with a gentle Ace and Kerlix.  Complication: None  Condition: Stable  Cephus Shelling, MD [facility] Office: [phone]   Cephus Shelling

## 2022-10-22 NOTE — Consult Note (Signed)
Hospital Consult    Reason for Consult: Concern for lower extremity bypass infection Requesting Physician: ED MRN #:  [id number]  History of Present Illness: This is a 57 y.o. male status post 10/08/2022 left superficial femoral to posterior tibial artery bypass to the level of the ankle using running 6 mm PTFE.  The case went well.  Postop course uneventful.  Tony Hicks presented to the ED today with left lower extremity swelling, purulent drainage from the distal incision.  He notes significant pain from the left ankle running along bypass tunnel, which is palpable. He denies fever, chills.   Past Medical History:  Diagnosis Date   Diabetes mellitus without complication    type 2   Family history of adverse reaction to anesthesia    HLD (hyperlipidemia)    Syphilis    Treated  02/14/2019    Past Surgical History:  Procedure Laterality Date   ABDOMINAL AORTOGRAM W/LOWER EXTREMITY N/A 01/11/2021   Procedure: ABDOMINAL AORTOGRAM W/LOWER EXTREMITY;  Surgeon: Leonie Douglas, MD;  Location: [facility];  Service: Cardiovascular;  Laterality: N/A;   ABDOMINAL AORTOGRAM W/LOWER EXTREMITY N/A 10/24/2021   Procedure: ABDOMINAL AORTOGRAM W/LOWER EXTREMITY;  Surgeon: Cephus Shelling, MD;  Location: [facility];  Service: Cardiovascular;  Laterality: N/A;   ABDOMINAL AORTOGRAM W/LOWER EXTREMITY Bilateral 09/25/2022   Procedure: ABDOMINAL AORTOGRAM W/LOWER EXTREMITY;  Surgeon: Cephus Shelling, MD;  Location: [facility];  Service: Cardiovascular;  Laterality: Bilateral;   FEMORAL-TIBIAL BYPASS GRAFT Left 10/08/2022   Procedure: REDO LEFT SUPERFICIAL FEMORAL ARTERY-POSTERIOR TIBIAL BYPASS WITH PTFE;  Surgeon: Cephus Shelling, MD;  Location: [facility] OR;  Service: Vascular;  Laterality: Left;   LOWER EXTREMITY ANGIOGRAM Left 10/08/2022   Procedure: LOWER LEFT EXTREMITY ANGIOGRAM;  Surgeon: Cephus Shelling, MD;  Location: [facility] OR;  Service: Vascular;  Laterality: Left;    PERIPHERAL VASCULAR BALLOON ANGIOPLASTY  10/24/2021   Procedure: PERIPHERAL VASCULAR BALLOON ANGIOPLASTY;  Surgeon: Cephus Shelling, MD;  Location: [facility];  Service: Cardiovascular;;  Left SFA and Left Pop Bypass   THROMBECTOMY OF BYPASS GRAFT FEMORAL- TIBIAL ARTERY Left 01/14/2021   Procedure: LEFT ABOVE KNEE TO  POSTERIOR TIBIAL BYPASS GRAFT WITH GREATER SAPHENOUS VEIN;  Surgeon: Cephus Shelling, MD;  Location: [facility] OR;  Service: Vascular;  Laterality: Left;    No Known Allergies  Prior to Admission medications   Medication Sig Start Date End Date Taking? Authorizing Provider  acetaminophen (TYLENOL) 325 MG tablet Take 650 mg by mouth every 6 (six) hours as needed for mild pain or moderate pain.   Yes [provider]  aspirin EC 81 MG tablet Take 1 tablet (81 mg total) by mouth daily. Swallow whole. 05/14/22  Yes [name], NP  atorvastatin (LIPITOR) 40 MG tablet Take 1 tablet (40 mg total) by mouth daily. 09/19/22 03/18/23 Yes [name], NP  citalopram (CELEXA) 20 MG tablet Take 1 tablet (20 mg total) by mouth daily. 09/19/22  Yes [name], NP  clopidogrel (PLAVIX) 75 MG tablet Take 1 tablet (75 mg total) by mouth daily. 09/19/22  Yes [name], NP  ferrous sulfate 325 (65 FE) MG tablet Take 1 tablet (325 mg total) by mouth daily with breakfast. 09/19/22  Yes [name], NP  gabapentin (NEURONTIN) 300 MG capsule Take 1 capsule (300 mg total) by mouth 2 (two) times daily. 09/19/22  Yes [name], NP  metFORMIN (GLUCOPHAGE) 500 MG tablet Take 1 tablet (  500 mg total) by mouth daily with breakfast. 09/19/22  Yes [name], NP  nicotine (NICODERM CQ) 21 mg/24hr patch Place 1 patch (21 mg total) onto the skin daily. 09/19/22  Yes [name], NP  omega-3 acid ethyl esters (LOVAZA) 1 g capsule Take 1 capsule (1 g total) by mouth 2 (two) times daily. 09/22/22  Yes [name], NP  pantoprazole (PROTONIX) 40 MG tablet Take 1  tablet (40 mg total) by mouth daily. 09/19/22  Yes [name], NP  oxyCODONE-acetaminophen (PERCOCET) 5-325 MG tablet Take 1 tablet by mouth every 6 (six) hours as needed for severe pain. Patient not taking: Reported on 10/22/2022 10/11/22 10/11/23  Ernestene Mention, PA-C    Social History   Socioeconomic History   Marital status: Single    Spouse name: Not on file   Number of children: Not on file   Years of education: Not on file   Highest education level: Not on file  Occupational History   Occupation: Holiday representative  Tobacco Use   Smoking status: Every Day    Packs/day: .5    Types: Cigarettes, Cigars   Smokeless tobacco: Never   Tobacco comments:    Trying to cut back.  Been smoking since age 88 yrs old.  Vaping Use   Vaping Use: Never used  Substance and Sexual Activity   Alcohol use: Yes    Alcohol/week: 20.0 standard drinks of alcohol    Types: 20 Standard drinks or equivalent per week    Comment: Approx 20-25 beers week, cut back now drinking 12 pk as of 10/03/22   Drug use: Not Currently    Frequency: 1.0 times per week    Types: Cocaine, Marijuana    Comment: smoke crack   Sexual activity: Yes    Birth control/protection: Condom  Other Topics Concern   Not on file  Social History Narrative   ** Merged History Encounter **       Social Determinants of Health   Financial Resource Strain: Not on file  Food Insecurity: Not on file  Transportation Needs: Unmet Transportation Needs (02/13/2022)   PRAPARE - Administrator, Civil Service (Medical): Yes    Lack of Transportation (Non-Medical): Yes  Physical Activity: Not on file  Stress: Not on file  Social Connections: Not on file  Intimate Partner Violence: Not on file    Family History  Family history unknown: Yes    ROS: Otherwise negative unless mentioned in HPI  Physical Examination  Vitals:   10/22/22 0930 10/22/22 0951  BP: 116/61 116/61  Pulse: 74 68  Resp: 15 16  Temp:  98 F  (36.7 C)  SpO2: 95% 100%   Body mass index is 33.89 kg/m.  General:  WDWN in NAD Gait: Not observed HENT: WNL, normocephalic Pulmonary: normal non-labored breathing, without Rales, rhonchi,  wheezing Cardiac: regular Abdomen:  soft, NT/ND, no masses Skin: Devitalized tissue and blistering at the left distal incision at the level of the ankle, purulence appreciated on palpation Vascular Exam/Pulses: Multiphasic signal in the left posterior tibial artery distal to the bypass graft Extremities: without ischemic changes, without Gangrene , with cellulitis; with open wounds;  Musculoskeletal: no muscle wasting or atrophy  Neurologic: A&O X 3;  No focal weakness or paresthesias are detected; speech is fluent/normal Psychiatric:  The pt has Normal affect. Lymph:  Unremarkable  CBC    Component Value Date/Time   WBC 8.8 10/22/2022 0542   RBC 3.59 (L)  10/22/2022 0542   HGB 11.6 (L) 10/22/2022 0552   HGB 14.7 09/19/2022 1209   HCT 34.0 (L) 10/22/2022 0552   HCT 42.1 09/19/2022 1209   PLT 289 10/22/2022 0542   PLT 231 09/19/2022 1209   MCV 91.9 10/22/2022 0542   MCV 92 09/19/2022 1209   MCH 31.5 10/22/2022 0542   MCHC 34.2 10/22/2022 0542   RDW 12.0 10/22/2022 0542   RDW 12.8 09/19/2022 1209   LYMPHSABS 1.1 10/22/2022 0542   LYMPHSABS 1.7 03/14/2021 1204   MONOABS 0.9 10/22/2022 0542   EOSABS 0.0 10/22/2022 0542   EOSABS 0.1 03/14/2021 1204   BASOSABS 0.1 10/22/2022 0542   BASOSABS 0.0 03/14/2021 1204    BMET    Component Value Date/Time   NA 132 (L) 10/22/2022 0552   NA 144 09/19/2022 1209   K 3.8 10/22/2022 0552   CL 97 (L) 10/22/2022 0552   CO2 23 10/22/2022 0542   GLUCOSE 150 (H) 10/22/2022 0552   BUN 8 10/22/2022 0552   BUN 11 09/19/2022 1209   CREATININE 0.70 10/22/2022 0552   CALCIUM 8.7 (L) 10/22/2022 0542   GFRNONAA >60 10/22/2022 0542   GFRAA 101 02/17/2019 1543    COAGS: Lab Results  Component Value Date   INR 1.2 10/22/2022   INR 1.0 10/08/2022    INR 0.95 06/06/2009     ASSESSMENT/PLAN: This is a 57 y.o. male status post/3/24 SFA to PT bypass using 6 mm ringed PTFE.  The distal limb incision is infected, there is purulence appreciated.  There is erythema and induration along the bypass tract.  The bypass graft needs to be resected.  I had a long conversation with Tony Hicks regarding the above.  He would like to do everything possible to save the left leg, and realizes that this is becoming tougher time for now that he has had 2 previous bypass surgeries.  I have checked our supply, and we have to cadaver veins of adequate size for implantation.  After discussing the risk and benefits of left leg bypass resection, redo SFA to posterior tibial bypass using cadaveric greater saphenous vein, artery elected to proceed.  Please keep n.p.o. Please start vanc and Zosyn IntraOp cultures will be obtained    Fara Olden MD MS [facility] [phone] 10/22/2022  11:23 AM

## 2022-10-22 NOTE — Anesthesia Preprocedure Evaluation (Signed)
Anesthesia Evaluation  Patient identified by MRN, date of birth, ID band Patient awake    Reviewed: Allergy & Precautions, NPO status , Patient's Chart, lab work & pertinent test results  History of Anesthesia Complications Negative for: history of anesthetic complications  Airway Mallampati: III  TM Distance: >3 FB Neck ROM: Full    Dental  (+) Poor Dentition, Dental Advisory Given   Pulmonary Recent URI , Residual Cough, Current Smoker and Patient abstained from smoking.   + rhonchi        Cardiovascular + Peripheral Vascular Disease   Rhythm:Regular     Neuro/Psych  Neuromuscular disease  negative psych ROS   GI/Hepatic Neg liver ROS,GERD  Medicated,,  Endo/Other  diabetes  Lab Results      Component                Value               Date                      HGBA1C                   5.7 (A)             09/19/2022             Renal/GU negative Renal ROSLab Results      Component                Value               Date                      CREATININE               0.70                10/22/2022                Musculoskeletal   Abdominal   Peds  Hematology  (+) Blood dyscrasia, anemia Lab Results      Component                Value               Date                      WBC                      8.8                 10/22/2022                HGB                      11.6 (L)            10/22/2022                HCT                      34.0 (L)            10/22/2022                MCV                      91.9  10/22/2022                PLT                      289                 10/22/2022              Anesthesia Other Findings   Reproductive/Obstetrics                             Anesthesia Physical Anesthesia Plan  ASA: 3  Anesthesia Plan: General   Post-op Pain Management: Ofirmev IV (intra-op)*   Induction: Intravenous  PONV Risk Score and Plan: 1 and  Ondansetron and Dexamethasone  Airway Management Planned: Oral ETT  Additional Equipment: Arterial line  Intra-op Plan:   Post-operative Plan: Extubation in OR  Informed Consent: I have reviewed the patients History and Physical, chart, labs and discussed the procedure including the risks, benefits and alternatives for the proposed anesthesia with the patient or authorized representative who has indicated his/her understanding and acceptance.     Dental advisory given  Plan Discussed with: CRNA  Anesthesia Plan Comments:        Anesthesia Quick Evaluation

## 2022-10-22 NOTE — ED Triage Notes (Addendum)
Pt BIB [facility] from home c/o LLE post op pain. Pt surgery 10/14/22, since having increased pain radiating up to groin, swelling, warm, and painful to touch. Incision also oozing a moderate amount soaking through multiple gauze dressings.

## 2022-10-22 NOTE — Progress Notes (Signed)
PHARMACY - PHYSICIAN COMMUNICATION CRITICAL VALUE ALERT - BLOOD CULTURE IDENTIFICATION (BCID)  Tony Hicks is an 57 y.o. male who presented to [facility] on 10/22/2022 with a chief complaint of c/f lower extremity bypass infection  Assessment: 1/4 BCx with strep spp.   Name of physician (or Provider) Contacted: Karin Lieu  Current antibiotics: vancomycin   Changes to prescribed antibiotics recommended:  Consider de-escalation to ceftriaxone as able - d/w MD will await further culture results with c/f multi-organism involvement   Results for orders placed or performed during the hospital encounter of 10/22/22  Blood Culture ID Panel (Reflexed) (Collected: 10/22/2022  5:42 AM)  Result Value Ref Range   Enterococcus faecalis NOT DETECTED NOT DETECTED   Enterococcus Faecium NOT DETECTED NOT DETECTED   Listeria monocytogenes NOT DETECTED NOT DETECTED   Staphylococcus species NOT DETECTED NOT DETECTED   Staphylococcus aureus (BCID) NOT DETECTED NOT DETECTED   Staphylococcus epidermidis NOT DETECTED NOT DETECTED   Staphylococcus lugdunensis NOT DETECTED NOT DETECTED   Streptococcus species DETECTED (A) NOT DETECTED   Streptococcus agalactiae NOT DETECTED NOT DETECTED   Streptococcus pneumoniae NOT DETECTED NOT DETECTED   Streptococcus pyogenes NOT DETECTED NOT DETECTED   A.calcoaceticus-baumannii NOT DETECTED NOT DETECTED   Bacteroides fragilis NOT DETECTED NOT DETECTED   Enterobacterales NOT DETECTED NOT DETECTED   Enterobacter cloacae complex NOT DETECTED NOT DETECTED   Escherichia coli NOT DETECTED NOT DETECTED   Klebsiella aerogenes NOT DETECTED NOT DETECTED   Klebsiella oxytoca NOT DETECTED NOT DETECTED   Klebsiella pneumoniae NOT DETECTED NOT DETECTED   Proteus species NOT DETECTED NOT DETECTED   Salmonella species NOT DETECTED NOT DETECTED   Serratia marcescens NOT DETECTED NOT DETECTED   Haemophilus influenzae NOT DETECTED NOT DETECTED   Neisseria meningitidis NOT DETECTED NOT  DETECTED   Pseudomonas aeruginosa NOT DETECTED NOT DETECTED   Stenotrophomonas maltophilia NOT DETECTED NOT DETECTED   Candida albicans NOT DETECTED NOT DETECTED   Candida auris NOT DETECTED NOT DETECTED   Candida glabrata NOT DETECTED NOT DETECTED   Candida krusei NOT DETECTED NOT DETECTED   Candida parapsilosis NOT DETECTED NOT DETECTED   Candida tropicalis NOT DETECTED NOT DETECTED   Cryptococcus neoformans/gattii NOT DETECTED NOT DETECTED    Calton Dach, PharmD Clinical Pharmacist 10/22/2022 9:08 PM

## 2022-10-22 NOTE — Progress Notes (Signed)
Pharmacy Antibiotic Note  Tony Hicks is a 57 y.o. male admitted on 10/22/2022 with  post-op wound infection .  Pharmacy has been consulted for vancomycin dosing. Pt is afebrile and WBC is WNL. Scr is WNL and lactic acid is WNL.   Plan: Vancomycin 2g IV x 1 then 1g IV Q12H F/u renal fxn, C&S, clinical status and peak/trough at SS  Height:  (167.6 cm) Weight: 95.3 kg (210 lb) IBW/kg (Calculated) : 63.8  Temp (24hrs), Avg:98.1 F (36.7 C), Min:98.1 F (36.7 C), Max:98.1 F (36.7 C)  Recent Labs  Lab 10/22/22 0542 10/22/22 0552  WBC 8.8  --   CREATININE 0.90 0.70  LATICACIDVEN 1.1  --     Estimated Creatinine Clearance: 112.7 mL/min (by C-G formula based on SCr of 0.7 mg/dL).    No Known Allergies  Antimicrobials this admission: Vanc 4/17>>  Dose adjustments this admission: N/A  Microbiology results: Pending  Thank you for allowing pharmacy to be a part of this patient's care.  Kattaleya Alia, Drake Leach 10/22/2022 8:51 AM

## 2022-10-22 NOTE — ED Notes (Signed)
Report given to Leanne, RN in short stay.   

## 2022-10-22 NOTE — ED Provider Notes (Signed)
7:42 AM Care assumed from Dr. Blinda Leatherwood.  At time of transfer of care, patient is awaiting assessment by vascular surgery for suspected postoperative problem with left leg pain, swelling, redness, purulent drainage from wound.  According to previous team and patient, patient had surgery on 10/14/2022 with a revision on the left leg and since then has been having increasing pain and swelling and warmth and incision started draining a purulent oozing discharge.  Plan of care is to call vascular surgery to discuss if they would want imaging versus having them see patient and determine if he needs to go for surgical evaluation and washout.   Patient had some labs started in triage that showed normal lactic acid, and a nonelevated white blood cell count.  Hemoglobin is 11.3 slightly decreased from previously.  Anticipate discussion with vascular surgery to determine disposition.  8:02 AM Just spoke to vascular surgery who will come see patient.  He will remain n.p.o. at this time.   Sydney Azure, Canary Brim, MD 10/22/22 [phone]

## 2022-10-22 NOTE — Transfer of Care (Signed)
Immediate Anesthesia Transfer of Care Note  Patient: Tony Hicks  Procedure(s) Performed: LEFT LEG REDO SUPERFICAL SAPHENOUS VEIN TO CRYO SAPHENOUS VEIN BYPASS W/ GRAFT EXCISION (Left)  Patient Location: PACU  Anesthesia Type:General  Level of Consciousness: awake and alert   Airway & Oxygen Therapy: Patient Spontanous Breathing and Patient connected to face mask oxygen  Post-op Assessment: Report given to RN and Post -op Vital signs reviewed and stable  Post vital signs: Reviewed and stable  Last Vitals:  Vitals Value Taken Time  BP 123/70 10/22/22 1815  Temp    Pulse 83 10/22/22 1816  Resp 26 10/22/22 1816  SpO2 95 % 10/22/22 1816  Vitals shown include unvalidated device data.  Last Pain:  Vitals:   10/22/22 1434  TempSrc:   PainSc: 7          Complications: No notable events documented.

## 2022-10-22 NOTE — Anesthesia Procedure Notes (Signed)
Procedure Name: Intubation Date/Time: 10/22/2022 4:04 PM  Performed by: Tressia Miners, CRNAPre-anesthesia Checklist: Patient identified, Emergency Drugs available, Suction available and Patient being monitored Patient Re-evaluated:Patient Re-evaluated prior to induction Oxygen Delivery Method: Circle System Utilized Preoxygenation: Pre-oxygenation with 100% oxygen Induction Type: IV induction Ventilation: Mask ventilation without difficulty Laryngoscope Size: Mac and 4 Grade View: Grade I Tube type: Oral Tube size: 7.5 mm Number of attempts: 1 Airway Equipment and Method: Stylet Placement Confirmation: ETT inserted through vocal cords under direct vision, positive ETCO2 and breath sounds checked- equal and bilateral Secured at: 22 cm Tube secured with: Tape Dental Injury: Teeth and Oropharynx as per pre-operative assessment

## 2022-10-23 ENCOUNTER — Encounter (HOSPITAL_COMMUNITY): Payer: Self-pay | Admitting: Vascular Surgery

## 2022-10-23 ENCOUNTER — Other Ambulatory Visit: Payer: Self-pay

## 2022-10-23 ENCOUNTER — Inpatient Hospital Stay (HOSPITAL_COMMUNITY): Payer: Medicaid Other

## 2022-10-23 DIAGNOSIS — Q2112 Patent foramen ovale: Secondary | ICD-10-CM

## 2022-10-23 LAB — BASIC METABOLIC PANEL
Anion gap: 11 (ref 5–15)
BUN: 13 mg/dL (ref 6–20)
CO2: 23 mmol/L (ref 22–32)
Calcium: 8.1 mg/dL — ABNORMAL LOW (ref 8.9–10.3)
Chloride: 97 mmol/L — ABNORMAL LOW (ref 98–111)
Creatinine, Ser: 0.83 mg/dL (ref 0.61–1.24)
GFR, Estimated: >60 mL/min (ref >60)
Glucose, Bld: 305 mg/dL — ABNORMAL HIGH (ref 70–99)
Potassium: 5.1 mmol/L (ref 3.5–5.1)
Sodium: 131 mmol/L — ABNORMAL LOW (ref 135–145)

## 2022-10-23 LAB — LIPID PANEL
Cholesterol: 99 mg/dL (ref 0–200)
HDL: 14 mg/dL — ABNORMAL LOW (ref >40)
LDL Cholesterol: 68 mg/dL (ref 0–99)
Total CHOL/HDL Ratio: 7.1 RATIO
Triglycerides: 87 mg/dL (ref <150)
VLDL: 17 mg/dL (ref 0–40)

## 2022-10-23 LAB — CBC
HCT: 25.5 % — ABNORMAL LOW (ref 39.0–52.0)
Hemoglobin: 9.2 g/dL — ABNORMAL LOW (ref 13.0–17.0)
MCH: 32.4 pg (ref 26.0–34.0)
MCHC: 36.1 g/dL — ABNORMAL HIGH (ref 30.0–36.0)
MCV: 89.8 fL (ref 80.0–100.0)
Platelets: 261 10*3/uL (ref 150–400)
RBC: 2.84 MIL/uL — ABNORMAL LOW (ref 4.22–5.81)
RDW: 11.9 % (ref 11.5–15.5)
WBC: 9.6 10*3/uL (ref 4.0–10.5)
nRBC: 0 % (ref 0.0–0.2)

## 2022-10-23 LAB — GLUCOSE, CAPILLARY
Glucose-Capillary: 146 mg/dL — ABNORMAL HIGH (ref 70–99)
Glucose-Capillary: 271 mg/dL — ABNORMAL HIGH (ref 70–99)
Glucose-Capillary: 197 mg/dL — ABNORMAL HIGH (ref 70–99)
Glucose-Capillary: 172 mg/dL — ABNORMAL HIGH (ref 70–99)
Glucose-Capillary: 159 mg/dL — ABNORMAL HIGH (ref 70–99)

## 2022-10-23 LAB — ECHOCARDIOGRAM COMPLETE
AR max vel: 1.91 cm2
AV Peak grad: 13.1 mmHg
Ao pk vel: 1.81 m/s
Area-P 1/2: 5.02 cm2
Height: 66 in
S' Lateral: 3.6 cm
Weight: 3386.27 oz

## 2022-10-23 LAB — AEROBIC/ANAEROBIC CULTURE W GRAM STAIN (SURGICAL/DEEP WOUND)

## 2022-10-23 LAB — CULTURE, BLOOD (ROUTINE X 2)

## 2022-10-23 MED ORDER — SODIUM CHLORIDE 0.9 % IV SOLN
2.0000 g | INTRAVENOUS | Status: DC
  Administered 2022-10-23: 2 g via INTRAVENOUS
  Filled 2022-10-23: qty 20

## 2022-10-23 MED ORDER — HEPARIN SODIUM (PORCINE) 5000 UNIT/ML IJ SOLN
5000.0000 [IU] | Freq: Three times a day (TID) | INTRAMUSCULAR | Status: DC
  Administered 2022-10-23 – 2022-10-29 (×18): 5000 [IU] via SUBCUTANEOUS
  Filled 2022-10-23 (×18): qty 1

## 2022-10-23 MED ORDER — MAGNESIUM CITRATE PO SOLN: 1.0000 | Freq: Once | ORAL | Status: DC | PRN

## 2022-10-23 MED ORDER — PIPERACILLIN-TAZOBACTAM 3.375 G IVPB
3.3750 g | Freq: Three times a day (TID) | INTRAVENOUS | Status: DC
  Administered 2022-10-23 – 2022-10-24 (×3): 3.375 g via INTRAVENOUS
  Filled 2022-10-23 (×4): qty 50

## 2022-10-23 MED ORDER — ACETAMINOPHEN 650 MG RE SUPP: 325.0000 mg | RECTAL | Status: DC | PRN

## 2022-10-23 MED ORDER — FERROUS SULFATE 325 (65 FE) MG PO TABS
325.0000 mg | ORAL_TABLET | Freq: Every day | ORAL | Status: DC
  Administered 2022-10-23 – 2022-10-27 (×5): 325 mg via ORAL
  Filled 2022-10-23 (×5): qty 1

## 2022-10-23 MED ORDER — ALUM & MAG HYDROXIDE-SIMETH 200-200-20 MG/5ML PO SUSP: 15.0000 mL | ORAL | Status: DC | PRN

## 2022-10-23 MED ORDER — LABETALOL HCL 5 MG/ML IV SOLN: 10.0000 mg | INTRAVENOUS | Status: DC | PRN

## 2022-10-23 MED ORDER — PIPERACILLIN-TAZOBACTAM 3.375 G IVPB
3.3750 g | Freq: Three times a day (TID) | INTRAVENOUS | Status: DC
  Administered 2022-10-23: 3.375 g via INTRAVENOUS
  Filled 2022-10-23: qty 50

## 2022-10-23 MED ORDER — ACETAMINOPHEN 325 MG PO TABS: 650.0000 mg | ORAL_TABLET | Freq: Four times a day (QID) | ORAL | Status: DC | PRN

## 2022-10-23 MED ORDER — GUAIFENESIN-DM 100-10 MG/5ML PO SYRP: 15.0000 mL | ORAL_SOLUTION | ORAL | Status: DC | PRN

## 2022-10-23 MED ORDER — POTASSIUM CHLORIDE CRYS ER 20 MEQ PO TBCR: 20.0000 meq | EXTENDED_RELEASE_TABLET | Freq: Every day | ORAL | Status: DC | PRN

## 2022-10-23 MED ORDER — METFORMIN HCL 500 MG PO TABS
500.0000 mg | ORAL_TABLET | Freq: Every day | ORAL | Status: DC
  Administered 2022-10-24 – 2022-10-29 (×6): 500 mg via ORAL
  Filled 2022-10-23 (×6): qty 1

## 2022-10-23 MED ORDER — ACETAMINOPHEN 325 MG PO TABS: 325.0000 mg | ORAL_TABLET | ORAL | Status: DC | PRN

## 2022-10-23 MED ORDER — DOCUSATE SODIUM 100 MG PO CAPS
100.0000 mg | ORAL_CAPSULE | Freq: Every day | ORAL | Status: DC
  Administered 2022-10-23 – 2022-10-29 (×5): 100 mg via ORAL
  Filled 2022-10-23 (×7): qty 1

## 2022-10-23 MED ORDER — SENNOSIDES-DOCUSATE SODIUM 8.6-50 MG PO TABS: 1.0000 | ORAL_TABLET | Freq: Every evening | ORAL | Status: DC | PRN

## 2022-10-23 MED ORDER — PHENOL 1.4 % MT LIQD: 1.0000 | OROMUCOSAL | Status: DC | PRN

## 2022-10-23 MED ORDER — BISACODYL 5 MG PO TBEC: 5.0000 mg | DELAYED_RELEASE_TABLET | Freq: Every day | ORAL | Status: DC | PRN

## 2022-10-23 MED ORDER — VANCOMYCIN HCL 1250 MG/250ML IV SOLN
1250.0000 mg | Freq: Two times a day (BID) | INTRAVENOUS | Status: AC
  Administered 2022-10-23 – 2022-10-24 (×3): 1250 mg via INTRAVENOUS
  Filled 2022-10-23 (×3): qty 250

## 2022-10-23 MED ORDER — SODIUM CHLORIDE 0.9 % IV SOLN: INTRAVENOUS | Status: DC

## 2022-10-23 MED ORDER — HYDRALAZINE HCL 20 MG/ML IJ SOLN: 5.0000 mg | INTRAMUSCULAR | Status: DC | PRN

## 2022-10-23 MED ORDER — SODIUM CHLORIDE 0.9 % IV SOLN: 500.0000 mL | Freq: Once | INTRAVENOUS | Status: DC | PRN

## 2022-10-23 MED ORDER — METOPROLOL TARTRATE 5 MG/5ML IV SOLN: 2.0000 mg | INTRAVENOUS | Status: DC | PRN

## 2022-10-23 MED ORDER — MAGNESIUM SULFATE 2 GM/50ML IV SOLN: 2.0000 g | Freq: Every day | INTRAVENOUS | Status: DC | PRN

## 2022-10-23 NOTE — Evaluation (Signed)
Physical Therapy Evaluation Patient Details Name: Tony Hicks MRN: [id number] DOB: 03-26-1966 Today's Date: 10/23/2022  History of Present Illness  Patient is a 57 year old male who recently underwent a left SFA to posterior tibial bypass. 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 He reports using crutches for mobility prior to this hospital admission due to left leg pain when the leg was in a dependent position.  Today, he reports 5/10 pain in the left leg. Short distance ambulation using rolling walker with reinforcement of proper technique. He prefers using the rolling walker over the crutches at this time and could benefit from rolling walker at discharge. Anticipate gait distance and standing activity tolerance will improve as pain is better. As the patient is Modified independent with all activity, no acute PT needs demonstrated at this time.      Recommendations for follow up therapy are one component of a multi-disciplinary discharge planning process, led by the attending physician.  Recommendations may be updated based on patient status, additional functional criteria and insurance authorization.  Follow Up Recommendations       Assistance Recommended at Discharge PRN  Patient can return home with the following  Assist for transportation;Assistance with cooking/housework    Equipment Recommendations Rolling walker (2 wheels)  Recommendations for Other Services       Functional Status Assessment Patient has not had a recent decline in their functional status     Precautions / Restrictions Precautions Precautions: Fall Restrictions Weight Bearing Restrictions: No      Mobility  Bed Mobility Overal bed mobility: Modified Independent                  Transfers Overall transfer level: Modified independent Equipment used: Rolling walker (2 wheels) Transfers: Sit to/from Stand Sit to  Stand: Modified independent (Device/Increase time)           General transfer comment: verbal cues for technique. increased time, no physical assistance needed    Ambulation/Gait Ambulation/Gait assistance: Modified independent (Device/Increase time) Gait Distance (Feet): 20 Feet Assistive device: Rolling walker (2 wheels) Gait Pattern/deviations: Step-through pattern, Decreased stance time - left Gait velocity: decreased     General Gait Details: no loss of balance. reinforcement of proper placement of rolling walker. he prefers the walker over the crutches that he has been using. further ambulation distance is limited by pain. anticiapte gait distance and standing activity tolerance will improve as pain is better  Stairs            Wheelchair Mobility    Modified Rankin (Stroke Patients Only)       Balance Overall balance assessment: Needs assistance Sitting-balance support: Feet supported Sitting balance-Leahy Scale: Good     Standing balance support: Bilateral upper extremity supported, Reliant on assistive device for balance Standing balance-Leahy Scale: Fair                               Pertinent Vitals/Pain Pain Assessment Pain Assessment: 0-10 Pain Score: 5  Pain Location: LLE Pain Descriptors / Indicators: Discomfort Pain Intervention(s): Monitored during session, Limited activity within patient's tolerance, RN gave pain meds during session, Repositioned    Home Living Family/patient expects to be discharged to:: Private residence Living Arrangements: Parent (staying with his mother) Available Help at Discharge: Family;Available PRN/intermittently Type of Home: House Home Access: Level entry  Home Layout: One level Home Equipment: Crutches;Hand held shower head;Grab bars - tub/shower;Grab bars - toilet      Prior Function Prior Level of Function : Independent/Modified Independent             Mobility Comments: recently  had to use crutches for support with ambulation due to leg pain. otherwise independent with mobility       Hand Dominance        Extremity/Trunk Assessment   Upper Extremity Assessment Upper Extremity Assessment: Overall WFL for tasks assessed    Lower Extremity Assessment Lower Extremity Assessment: LLE deficits/detail LLE Deficits / Details: pain limited, no knee buckling with weight bearing LLE Sensation:  (difficut to test due to large post-op bandage. able to detect light touch at foot)       Communication      Cognition Arousal/Alertness: Awake/alert Behavior During Therapy: WFL for tasks assessed/performed Overall Cognitive Status: Within Functional Limits for tasks assessed                                          General Comments      Exercises     Assessment/Plan    PT Assessment Patient does not need any further PT services  PT Problem List         PT Treatment Interventions      PT Goals (Current goals can be found in the Care Plan section)  Acute Rehab PT Goals PT Goal Formulation: All assessment and education complete, DC therapy    Frequency       Co-evaluation               AM-PAC PT "6 Clicks" Mobility  Outcome Measure Help needed turning from your back to your side while in a flat bed without using bedrails?: None Help needed moving from lying on your back to sitting on the side of a flat bed without using bedrails?: None Help needed moving to and from a bed to a chair (including a wheelchair)?: None Help needed standing up from a chair using your arms (e.g., wheelchair or bedside chair)?: None Help needed to walk in hospital room?: None Help needed climbing 3-5 steps with a railing? : None 6 Click Score: 24    End of Session   Activity Tolerance: Patient tolerated treatment well Patient left: in bed;with call bell/phone within reach;with bed alarm set Nurse Communication: Mobility status PT Visit Diagnosis:  Pain Pain - Right/Left: Left Pain - part of body: Leg    Time: 1610-9604 PT Time Calculation (min) (ACUTE ONLY): 19 min   Charges:   PT Evaluation $PT Eval Low Complexity: 1 Low          Donna Bernard, PT, MPT   Ina Homes 10/23/2022, 12:27 PM

## 2022-10-23 NOTE — Inpatient Diabetes Management (Signed)
Inpatient Diabetes Program Recommendations  AACE/ADA: New Consensus Statement on Inpatient Glycemic Control (2015)  Target Ranges:  Prepandial:   less than 140 mg/dL      Peak postprandial:   less than 180 mg/dL (1-2 hours)      Critically ill patients:  140 - 180 mg/dL   Lab Results  Component Value Date   GLUCAP 197 (H) 10/23/2022   HGBA1C 5.5 10/22/2022    Review of Glycemic Control  Latest Reference Range & Units 10/22/22 14:34 10/22/22 18:12 10/23/22 07:35 10/23/22 11:28  Glucose-Capillary 70 - 99 mg/dL 161 (H) 096 (H) 045 (H) 197 (H)   Diabetes history: DM 2 Outpatient Diabetes medications: metformin 500 mg Daily Current orders for Inpatient glycemic control:  Metformin 500 mg Daily Novolog 0-15 units tid  A1c 5.5% on 4/17  Inpatient Diabetes Program Recommendations:    NOTE: pt received decadron 5 mg yesterday  Glucose trends should trend down in the next 24-48 hours  Thanks,  Christena Deem RN, MSN, BC-ADM Inpatient Diabetes Coordinator Team Pager [phone] (8a-5p)

## 2022-10-23 NOTE — Progress Notes (Signed)
[facility]  Subjective  - States his left leg is feeling a lot better.     Objective 110/74 79 97.7 F (36.5 C) (Oral) 19 96%  Intake/Output Summary (Last 24 hours) at 10/23/2022 0928 Last data filed at 10/22/2022 2045 Gross per 24 hour  Intake 2400 ml  Output 600 ml  Net 1800 ml    Dressing changed to left leg with Penrose drains Brisk left PT signal at the ankle  Laboratory Lab Results: Recent Labs    10/22/22 2313 10/23/22 0503  WBC 13.5* 9.6  HGB 10.0* 9.2*  HCT 28.5* 25.5*  PLT 280 261   BMET Recent Labs    10/22/22 0542 10/22/22 0552 10/22/22 2313 10/23/22 0503  NA 131* 132*  --  131*  K 3.8 3.8  --  5.1  CL 96* 97*  --  97*  CO2 23  --   --  23  GLUCOSE 148* 150*  --  305*  BUN 8 8  --  13  CREATININE 0.90 0.70 0.85 0.83  CALCIUM 8.7*  --   --  8.1*    COAG Lab Results  Component Value Date   INR 1.2 10/22/2022   INR 1.0 10/08/2022   INR 0.95 06/06/2009   No results found for: "PTT"  Assessment/Planning:  Postop day 1 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  Patient underwent redo bypass with CryoVein from the SFA to the PT at the ankle.  Brisk PT signal.  Dressing changed at bedside given he has Penrose drains placed in the previous PTFE track where the graft was removed.  Blood cultures are positive for strep.  OR cultures are still pending.  Will order echocardiogram.  Would continue broad spectrum antibiotics until cultures positive.  Tony Hicks 10/23/2022 9:28 AM --

## 2022-10-23 NOTE — Progress Notes (Signed)
[facility]  Subjective  - doing OK a little worried about his right leg.   Objective 110/74 79 97.7 F (36.5 C) (Oral) 19 96%  Intake/Output Summary (Last 24 hours) at 10/23/2022 0272 Last data filed at 10/22/2022 2045 Gross per 24 hour  Intake 2400 ml  Output 600 ml  Net 1800 ml    Dressing changed to left leg with Penrose drains Brisk left PT signal at the ankle Dressing changed at bedside. Lungs non labored breathing General no acute distress      Assessment/Planning: POD # 1 removal left SFA to PT infected bypass and placement of cadaveric cryo great saphenous vein.  Penrose drain placed in old bypass tunnel for drainage will maintain until Dr. Chestine Spore states to remove them.    Leukocytosis improved now 9.6  He was placed on Vanc and Zosyn post op pending culture.   Intraoperative Cultures: Pending BC GRAM POSITIVE COCCI IN CHAINS  Plans for ID consult once cultures are finalized.   Echo pending  Mosetta Pigeon 10/23/2022 9:27 AM --  Laboratory Lab Results: Recent Labs    10/22/22 2313 10/23/22 0503  WBC 13.5* 9.6  HGB 10.0* 9.2*  HCT 28.5* 25.5*  PLT 280 261   BMET Recent Labs    10/22/22 0542 10/22/22 0552 10/22/22 2313 10/23/22 0503  NA 131* 132*  --  131*  K 3.8 3.8  --  5.1  CL 96* 97*  --  97*  CO2 23  --   --  23  GLUCOSE 148* 150*  --  305*  BUN 8 8  --  13  CREATININE 0.90 0.70 0.85 0.83  CALCIUM 8.7*  --   --  8.1*    COAG Lab Results  Component Value Date   INR 1.2 10/22/2022   INR 1.0 10/08/2022   INR 0.95 06/06/2009   No results found for: "PTT"

## 2022-10-23 NOTE — Progress Notes (Signed)
PHARMACIST LIPID MONITORING   Tony Hicks is a 57 y.o. male admitted on 10/22/2022 with concern for lower extremity bypass infection .  Pharmacy has been consulted to optimize lipid-lowering therapy with the indication of secondary prevention for clinical ASCVD.  Recent Labs:  Lipid Panel (last 6 months):   Lab Results  Component Value Date   CHOL 99 10/23/2022   TRIG 87 10/23/2022   HDL 14 (L) 10/23/2022   CHOLHDL 7.1 10/23/2022   VLDL 17 10/23/2022   LDLCALC 68 10/23/2022    Hepatic function panel (last 6 months):   Lab Results  Component Value Date   AST 23 10/22/2022   ALT 21 10/22/2022   ALKPHOS 65 10/22/2022   BILITOT 0.8 10/22/2022    SCr (since admission):   Serum creatinine: 0.83 mg/dL 19/14/78 2956 Estimated creatinine clearance: 109.1 mL/min  Current therapy and lipid therapy tolerance Current lipid-lowering therapy: atorvastatin 40 mg  Plan:    1.Statin intensity (high intensity recommended for all patients regardless of the LDL):  No statin changes. The patient is already on a high intensity statin.  2.Add ezetimibe (if any one of the following):   Not indicated at this time.  3.Refer to lipid clinic:   No  4.Follow-up with:  Primary care provider - Tony Andrew, NP  5.Follow-up labs after discharge:  No changes in lipid therapy, repeat a lipid panel in one year.      Jeanella Cara, PharmD, [facility] Clinical Pharmacist Please see AMION for all Pharmacists' Contact Phone Numbers 10/23/2022, 7:50 AM

## 2022-10-23 NOTE — Evaluation (Signed)
Occupational Therapy Evaluation and DC Summary Patient Details Name: Tony Hicks MRN: [id number] DOB: Nov 21, 1965 Today's Date: 10/23/2022   History of Present Illness Patient is a 57 year old male who recently underwent a left SFA to posterior tibial bypass. 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 Pt currently functioning at or close to baseline when it comes to functional ambulation and bADLs. Pt has no further acute skilled OT needs at this time. No follow-up OT recommended, anticipate progression pending pain tolerance.     Recommendations for follow up therapy are one component of a multi-disciplinary discharge planning process, led by the attending physician.  Recommendations may be updated based on patient status, additional functional criteria and insurance authorization.   Assistance Recommended at Discharge PRN  Patient can return home with the following A little help with bathing/dressing/bathroom;Help with stairs or ramp for entrance    Functional Status Assessment  Patient has not had a recent decline in their functional status  Equipment Recommendations  Other (comment) (RW)    Recommendations for Other Services       Precautions / Restrictions Precautions Precautions: Fall Restrictions Weight Bearing Restrictions: No      Mobility Bed Mobility Overal bed mobility: Modified Independent                  Transfers Overall transfer level: Modified independent Equipment used: Rolling walker (2 wheels) Transfers: Sit to/from Stand Sit to Stand: Modified independent (Device/Increase time)                  Balance Overall balance assessment: Needs assistance Sitting-balance support: Feet supported Sitting balance-Leahy Scale:  Good     Standing balance support: Bilateral upper extremity supported, Reliant on assistive device for balance Standing balance-Leahy Scale: Fair                             ADL either performed or assessed with clinical judgement   ADL Overall ADL's : Modified independent;Independent                                       General ADL Comments: Pt demonstrated ability to complete bADLs with Mod I and functional ambulation with Mod I using RW     Vision         Perception     Praxis      Pertinent Vitals/Pain Pain Assessment Pain Score: 3  Pain Location: LLE Pain Descriptors / Indicators: Discomfort, Aching Pain Intervention(s): Limited activity within patient's tolerance, Monitored during session, Premedicated before session     Hand Dominance Right   Extremity/Trunk Assessment             Communication Communication Communication: No difficulties   Cognition Arousal/Alertness: Awake/alert Behavior During Therapy: WFL for tasks assessed/performed Overall Cognitive Status: Within Functional Limits for tasks assessed                                       General Comments  VSS on RA    Exercises     Shoulder  Instructions      Home Living Family/patient expects to be discharged to:: Private residence Living Arrangements: Parent (staying with his mother) Available Help at Discharge: Family;Available PRN/intermittently Type of Home: House Home Access: Level entry     Home Layout: One level     Bathroom Shower/Tub: Chief Strategy Officer: Handicapped height     Home Equipment: Crutches;Hand held shower head;Grab bars - tub/shower;Grab bars - toilet;Adaptive equipment Adaptive Equipment: Sock aid;Reacher;Long-handled shoe horn        Prior Functioning/Environment Prior Level of Function : Independent/Modified Independent             Mobility Comments: recently had to use crutches for  support with ambulation due to leg pain. otherwise independent with mobility ADLs Comments: Mod I in bADLs/iADLs        OT Problem List: Pain;Impaired balance (sitting and/or standing)      OT Treatment/Interventions: Self-care/ADL training;Patient/family education;Therapeutic activities;DME and/or AE instruction;Balance training    OT Goals(Current goals can be found in the care plan section) Acute Rehab OT Goals Patient Stated Goal: To go home OT Goal Formulation: With patient Time For Goal Achievement: 11/06/22 Potential to Achieve Goals: Good  OT Frequency:  (DC)    Co-evaluation              AM-PAC OT "6 Clicks" Daily Activity     Outcome Measure Help from another person eating meals?: None Help from another person taking care of personal grooming?: None Help from another person toileting, which includes using toliet, bedpan, or urinal?: None Help from another person bathing (including washing, rinsing, drying)?: A Little Help from another person to put on and taking off regular upper body clothing?: None Help from another person to put on and taking off regular lower body clothing?: A Little 6 Click Score: 22   End of Session Equipment Utilized During Treatment: Gait belt;Rolling walker (2 wheels) Nurse Communication: Mobility status  Activity Tolerance: Patient tolerated treatment well Patient left: in bed;with bed alarm set;with call bell/phone within reach  OT Visit Diagnosis: Pain;Unsteadiness on feet (R26.81) Pain - Right/Left: Left Pain - part of body: Leg                Time: 1610-9604 OT Time Calculation (min): 19 min Charges:  OT General Charges $OT Visit: 1 Visit OT Evaluation $OT Eval Low Complexity: 1 Low  10/23/2022  AB, OTR/L  Acute Rehabilitation Services  Office: [phone]   Tony Hicks 10/23/2022, 5:11 PM

## 2022-10-23 NOTE — Progress Notes (Signed)
Pharmacy Antibiotic Note  Tony Hicks is a 57 y.o. male admitted on 10/22/2022 with bacteremia and abscess .  Pharmacy has been consulted for Vanc and Zosyn dosing.  Vancomycin 1250 mg IV Q 12 hrs. Goal AUC 400-550. Expected AUC: 454 SCr used: 0.83  Plan: Vancomycin 1250 mg IV q12 hr Zosyn 3.375 gm IV q8hr (4 hour infusion) Monitor renal function, cultures and vanc levels as needed  Height:  (167.6 cm) Weight: 96 kg (211 lb 10.3 oz) IBW/kg (Calculated) : 63.8  Temp (24hrs), Avg:98.1 F (36.7 C), Min:97.7 F (36.5 C), Max:99 F (37.2 C)  Recent Labs  Lab 10/22/22 0542 10/22/22 0552 10/22/22 0736 10/22/22 2313 10/23/22 0503  WBC 8.8  --   --  13.5* 9.6  CREATININE 0.90 0.70  --  0.85 0.83  LATICACIDVEN 1.1  --  1.5  --   --     Estimated Creatinine Clearance: 109.1 mL/min (by C-G formula based on SCr of 0.83 mg/dL).    No Known Allergies   Thank you for allowing pharmacy to be a part of this patient's care.  Jeanella Cara, PharmD, [facility] Clinical Pharmacist Please see AMION for all Pharmacists' Contact Phone Numbers 10/23/2022, 9:44 AM

## 2022-10-23 NOTE — Progress Notes (Signed)
Echocardiogram 2D Echocardiogram has been performed.  Tony Hicks 10/23/2022, 2:17 PM

## 2022-10-24 ENCOUNTER — Encounter (HOSPITAL_COMMUNITY): Payer: Self-pay | Admitting: Vascular Surgery

## 2022-10-24 DIAGNOSIS — I739 Peripheral vascular disease, unspecified: Secondary | ICD-10-CM | POA: Diagnosis not present

## 2022-10-24 DIAGNOSIS — Z9889 Other specified postprocedural states: Secondary | ICD-10-CM | POA: Diagnosis not present

## 2022-10-24 LAB — CULTURE, BLOOD (ROUTINE X 2): Special Requests: ADEQUATE

## 2022-10-24 LAB — GLUCOSE, CAPILLARY
Glucose-Capillary: 175 mg/dL — ABNORMAL HIGH (ref 70–99)
Glucose-Capillary: 129 mg/dL — ABNORMAL HIGH (ref 70–99)
Glucose-Capillary: 141 mg/dL — ABNORMAL HIGH (ref 70–99)
Glucose-Capillary: 119 mg/dL — ABNORMAL HIGH (ref 70–99)

## 2022-10-24 LAB — AEROBIC/ANAEROBIC CULTURE W GRAM STAIN (SURGICAL/DEEP WOUND)

## 2022-10-24 MED ORDER — SODIUM CHLORIDE 0.9 % IV SOLN
8.0000 mg/kg | Freq: Every day | INTRAVENOUS | Status: DC
  Administered 2022-10-24 – 2022-10-26 (×3): 600 mg via INTRAVENOUS
  Filled 2022-10-24 (×4): qty 12

## 2022-10-24 MED ORDER — SODIUM CHLORIDE 0.9 % IV SOLN
3.0000 g | Freq: Four times a day (QID) | INTRAVENOUS | Status: DC
  Administered 2022-10-24 – 2022-10-27 (×13): 3 g via INTRAVENOUS
  Filled 2022-10-24 (×13): qty 8

## 2022-10-24 MED ORDER — SODIUM CHLORIDE 0.9 % IV SOLN
8.0000 mg/kg | Freq: Every day | INTRAVENOUS | Status: DC
  Filled 2022-10-24: qty 12

## 2022-10-24 NOTE — Consult Note (Signed)
[facility]                                                                                        Patient Identification: Patient Name: Tony Hicks MRN: [id number] Admit Date: 10/22/2022  5:21 AM Today's Date: 10/24/2022 Reason for consult: bacteremia, LLE abscess  Requesting provider:   Principal Problem:   Status post peripheral artery bypass Active Problems:   PAD (peripheral artery disease)   Antibiotics:  Vancomycin 4/17- Zosyn 4/17-  Lines/Hardware:  Assessment 57 Y O male with PMH as below including DM 2, history of syphilis treated, PAD status post multiple vascular intervention who recently had Re-do LLE  tibial bypass from the SFA to the posterior tibial artery at the ankle with a 6 mm ringed PTFE graft on 4/9 who presented to the ED on 4/17 with LLE  increasing pain in left leg radiating to the groin associated with swelling, warmth of the entire leg with oozing from surgical site.   S/p excision of prosthetic left superficial femoral to posterior tibial artery bypass , re-do left superficial femoral to posterior tibial artery bypass with cadaveric greater saphenous vein. OR cx MRSA, Group B G strep (prelim)  Blood cx 4/17 1/2 sets goup G strep. TTE 4/17 with no endocarditis but small PFO   Recommendations  Will switch Vancomycin to daptomycin and zosyn to Unasyn  Repeat 2 sets of blood cx ordered for today  Fu OR cx to completion for final abtx plan  Do not think TEE warranted currently, can be reassessed pending repeat blood cx Will need 6 weeks course of abtx from OR date. Do not expect PO suppression as all infected graft was out per my discussion with Vascular  Monitor CBC, BMP and CPK  Dr Luciana Axe covering this weekend and will fu cx.  New ID team to follow next week.   Rest of the management as per the primary team. Please call with questions or concerns.  Thank you  for the consult  Odette Fraction, MD Infectious Disease Physician [facility] [address] [place], [place] [address] Phone: [phone]  Fax: [phone]  __________________________________________________________________________________________________________ HPI and Hospital Course: 38 Y O male with PMH as below including DM 2, history of syphilis treated, PAD status post multiple vascular intervention who recently had Re-do LLE  tibial bypass from the SFA to the posterior tibial artery at the ankle with a 6 mm ringed PTFE graft on 4/9 who presented to the ED on 4/17 with LLE  increasing pain in left leg radiating to the groin associated with swelling, warmth of the entire leg. Surgical site oozing as well. Denied fevers , chills or taking PO abtx, Denies N/V/D.   S/p excision of prosthetic left superficial femoral to posterior tibial artery bypass , re-do left superficial femoral to posterior tibial artery bypass with cadaveric greater saphenous vein. OR Findings: There was purulent drainage throughout the bypass tunnel including at the proximal and distal anastomosis.  Cultures were sent.  The PTFE graft was completely excised.  We placed Penrose drains in the previous saphenectomy  tunnels.  New subcutaneous tunnel was made along the medial calf and thigh and a new SFA to posterior tibial bypass was performed with cadaveric vein with excellent PT signal at the ankle.   ROS: General- Denies fever, chills, loss of appetite and loss of weight HEENT - Denies headache, blurry vision, neck pain, sinus pain Chest - Denies any chest pain, SOB or cough CVS- Denies any dizziness/lightheadedness, syncopal attacks, palpitations Abdomen- Denies any nausea, vomiting, abdominal pain, hematochezia and diarrhea Neuro - Denies any weakness, numbness, tingling sensation Psych - Denies any changes in mood irritability or depressive symptoms GU- Denies  any burning, dysuria, hematuria or increased frequency of urination Skin - denies any rashes/lesions MSK - soreness in the left foot and leg +  Past Medical History:  Diagnosis Date   Diabetes mellitus without complication    type 2   Family history of adverse reaction to anesthesia    HLD (hyperlipidemia)    Syphilis    Treated  02/14/2019   Past Surgical History:  Procedure Laterality Date   ABDOMINAL AORTOGRAM W/LOWER EXTREMITY N/A 01/11/2021   Procedure: ABDOMINAL AORTOGRAM W/LOWER EXTREMITY;  Surgeon: Leonie Douglas, MD;  Location: [facility];  Service: Cardiovascular;  Laterality: N/A;   ABDOMINAL AORTOGRAM W/LOWER EXTREMITY N/A 10/24/2021   Procedure: ABDOMINAL AORTOGRAM W/LOWER EXTREMITY;  Surgeon: Cephus Shelling, MD;  Location: [facility];  Service: Cardiovascular;  Laterality: N/A;   ABDOMINAL AORTOGRAM W/LOWER EXTREMITY Bilateral 09/25/2022   Procedure: ABDOMINAL AORTOGRAM W/LOWER EXTREMITY;  Surgeon: Cephus Shelling, MD;  Location: [facility];  Service: Cardiovascular;  Laterality: Bilateral;   FEMORAL-POPLITEAL BYPASS GRAFT Left 10/22/2022   Procedure: LEFT LEG REDO SUPERFICAL SAPHENOUS VEIN TO CRYO SAPHENOUS VEIN BYPASS W/ GRAFT EXCISION;  Surgeon: Leonie Douglas, MD;  Location: [facility] OR;  Service: Vascular;  Laterality: Left;   FEMORAL-TIBIAL BYPASS GRAFT Left 10/08/2022   Procedure: REDO LEFT SUPERFICIAL FEMORAL ARTERY-POSTERIOR TIBIAL BYPASS WITH PTFE;  Surgeon: Cephus Shelling, MD;  Location: [facility] OR;  Service: Vascular;  Laterality: Left;   LOWER EXTREMITY ANGIOGRAM Left 10/08/2022   Procedure: LOWER LEFT EXTREMITY ANGIOGRAM;  Surgeon: Cephus Shelling, MD;  Location: [facility] OR;  Service: Vascular;  Laterality: Left;   PERIPHERAL VASCULAR BALLOON ANGIOPLASTY  10/24/2021   Procedure: PERIPHERAL VASCULAR BALLOON ANGIOPLASTY;  Surgeon: Cephus Shelling, MD;  Location: [facility];  Service: Cardiovascular;;  Left SFA and Left Pop  Bypass   THROMBECTOMY OF BYPASS GRAFT FEMORAL- TIBIAL ARTERY Left 01/14/2021   Procedure: LEFT ABOVE KNEE TO  POSTERIOR TIBIAL BYPASS GRAFT WITH GREATER SAPHENOUS VEIN;  Surgeon: Cephus Shelling, MD;  Location: [facility] OR;  Service: Vascular;  Laterality: Left;      Scheduled Meds:  aspirin EC  81 mg Oral Daily   atorvastatin  40 mg Oral Daily   citalopram  20 mg Oral Daily   clopidogrel  75 mg Oral Daily   docusate sodium  100 mg Oral Daily   ferrous sulfate  325 mg Oral Q breakfast   gabapentin  300 mg Oral BID   heparin  5,000 Units Subcutaneous Q8H   insulin aspart  0-15 Units Subcutaneous TID WC   metFORMIN  500 mg Oral Q breakfast   nicotine  21 mg Transdermal Daily   omega-3 acid ethyl esters  1 g Oral BID   pantoprazole  40 mg Oral Daily   Continuous Infusions:  sodium chloride     sodium chloride 75 mL/hr  at 10/24/22 0555   magnesium sulfate bolus IVPB     piperacillin-tazobactam (ZOSYN)  IV 3.375 g (10/24/22 0555)   vancomycin Stopped (10/23/22 2245)   PRN Meds:.sodium chloride, acetaminophen **OR** acetaminophen, alum & mag hydroxide-simeth, bisacodyl, guaiFENesin-dextromethorphan, hydrALAZINE, HYDROmorphone (DILAUDID) injection, labetalol, magnesium citrate, magnesium sulfate bolus IVPB, metoprolol tartrate, ondansetron, oxyCODONE-acetaminophen, phenol, potassium chloride, senna-docusate  No Known Allergies  Social History   Socioeconomic History   Marital status: Single    Spouse name: Not on file   Number of children: Not on file   Years of education: Not on file   Highest education level: Not on file  Occupational History   Occupation: Holiday representative  Tobacco Use   Smoking status: Every Day    Packs/day: .5    Types: Cigarettes, Cigars   Smokeless tobacco: Never   Tobacco comments:    Trying to cut back.  Been smoking since age 89 yrs old.  Vaping Use   Vaping Use: Never used  Substance and Sexual Activity   Alcohol use: Yes    Alcohol/week: 20.0  standard drinks of alcohol    Types: 20 Standard drinks or equivalent per week    Comment: Approx 20-25 beers week, cut back now drinking 12 pk as of 10/03/22   Drug use: Not Currently    Frequency: 1.0 times per week    Types: Cocaine, Marijuana    Comment: smoke crack   Sexual activity: Yes    Birth control/protection: Condom  Other Topics Concern   Not on file  Social History Narrative   ** Merged History Encounter **       Social Determinants of Health   Financial Resource Strain: Not on file  Food Insecurity: No Food Insecurity (10/24/2022)   Hunger Vital Sign    Worried About Running Out of Food in the Last Year: Never true    Ran Out of Food in the Last Year: Never true  Transportation Needs: No Transportation Needs (10/24/2022)   PRAPARE - Administrator, Civil Service (Medical): No    Lack of Transportation (Non-Medical): No  Physical Activity: Not on file  Stress: Not on file  Social Connections: Not on file  Intimate Partner Violence: Not At Risk (10/24/2022)   Humiliation, Afraid, Rape, and Kick questionnaire    Fear of Current or Ex-Partner: No    Emotionally Abused: No    Physically Abused: No    Sexually Abused: No   Family History  Family history unknown: Yes   Vitals BP 106/65 (BP Location: Left Arm)   Pulse 67   Temp (!) 97.5 F (36.4 C) (Oral)   Resp 16   Ht 5\' 6"  (1.676 m)   Wt 96 kg   SpO2 98%   BMI 34.16 kg/m   Physical Exam Constitutional: Adult male sitting in the bed and appears comfortable    Comments:   Cardiovascular:     Rate and Rhythm: Normal rate and regular rhythm.     Heart sounds: S1 and S2  Pulmonary:     Effort: Pulmonary effort is normal on room air     Comments: Normal breath sounds   Abdominal:     Palpations: Abdomen is soft.     Tenderness: non distended and non tender   Musculoskeletal:        General: No swelling or tenderness in other joints, Left foot and leg is wrapped in surgical dressing with  penrose drains C/D/I  Skin:    Comments: No rashes  Neurological:     General: awake, alert and oriented, grossly nonocal , follows commands   Psychiatric:        Mood and Affect: Mood normal.    Pertinent Microbiology Results for orders placed or performed during the hospital encounter of 10/22/22  Blood Culture (routine x 2)     Status: None (Preliminary result)   Collection Time: 10/22/22  5:41 AM   Specimen: BLOOD  Result Value Ref Range Status   Specimen Description BLOOD LEFT ANTECUBITAL  Final   Special Requests   Final    BOTTLES DRAWN AEROBIC AND ANAEROBIC Blood Culture adequate volume   Culture   Final    NO GROWTH 2 DAYS Performed at [facility], [address] [place], [place] [address]    Report Status PENDING  Incomplete  Blood Culture (routine x 2)     Status: Abnormal   Collection Time: 10/22/22  5:42 AM   Specimen: BLOOD  Result Value Ref Range Status   Specimen Description BLOOD LEFT ANTECUBITAL  Final   Special Requests   Final    BOTTLES DRAWN AEROBIC AND ANAEROBIC Blood Culture adequate volume   Culture  Setup Time   Final    GRAM POSITIVE COCCI IN CHAINS IN BOTH AEROBIC AND ANAEROBIC BOTTLES CRITICAL RESULT CALLED TO, READ BACK BY AND VERIFIED WITH: PHARMD MADELEINE MITCHELL ON 10/19/22 @ 2104 BY DRT Performed at [facility], [address] [place], [place] [address]    Culture STREPTOCOCCUS GROUP G (A)  Final   Report Status 10/24/2022 FINAL  Final   Organism ID, Bacteria STREPTOCOCCUS GROUP G  Final      Susceptibility   Streptococcus group g - MIC*    CLINDAMYCIN RESISTANT Resistant     AMPICILLIN <=0.25 SENSITIVE Sensitive     ERYTHROMYCIN >=8 RESISTANT Resistant     VANCOMYCIN 0.5 SENSITIVE Sensitive     CEFTRIAXONE <=0.12 SENSITIVE Sensitive     LEVOFLOXACIN 0.5 SENSITIVE Sensitive     PENICILLIN <=0.06 SENSITIVE Sensitive     * STREPTOCOCCUS GROUP G  Blood Culture ID Panel (Reflexed)     Status: Abnormal   Collection  Time: 10/22/22  5:42 AM  Result Value Ref Range Status   Enterococcus faecalis NOT DETECTED NOT DETECTED Final   Enterococcus Faecium NOT DETECTED NOT DETECTED Final   Listeria monocytogenes NOT DETECTED NOT DETECTED Final   Staphylococcus species NOT DETECTED NOT DETECTED Final   Staphylococcus aureus (BCID) NOT DETECTED NOT DETECTED Final   Staphylococcus epidermidis NOT DETECTED NOT DETECTED Final   Staphylococcus lugdunensis NOT DETECTED NOT DETECTED Final   Streptococcus species DETECTED (A) NOT DETECTED Final    Comment: Not Enterococcus species, Streptococcus agalactiae, Streptococcus pyogenes, or Streptococcus pneumoniae. CRITICAL RESULT CALLED TO, READ BACK BY AND VERIFIED WITH: PHARMD MADELEINE MITCHELL ON 10/19/22 @ 2104 BY DRT    Streptococcus agalactiae NOT DETECTED NOT DETECTED Final   Streptococcus pneumoniae NOT DETECTED NOT DETECTED Final   Streptococcus pyogenes NOT DETECTED NOT DETECTED Final   A.calcoaceticus-baumannii NOT DETECTED NOT DETECTED Final   Bacteroides fragilis NOT DETECTED NOT DETECTED Final   Enterobacterales NOT DETECTED NOT DETECTED Final   Enterobacter cloacae complex NOT DETECTED NOT DETECTED Final   Escherichia coli NOT DETECTED NOT DETECTED Final   Klebsiella aerogenes NOT DETECTED NOT DETECTED Final   Klebsiella oxytoca NOT DETECTED NOT DETECTED Final   Klebsiella pneumoniae NOT DETECTED NOT DETECTED Final   Proteus species NOT DETECTED NOT DETECTED Final  Salmonella species NOT DETECTED NOT DETECTED Final   Serratia marcescens NOT DETECTED NOT DETECTED Final   Haemophilus influenzae NOT DETECTED NOT DETECTED Final   Neisseria meningitidis NOT DETECTED NOT DETECTED Final   Pseudomonas aeruginosa NOT DETECTED NOT DETECTED Final   Stenotrophomonas maltophilia NOT DETECTED NOT DETECTED Final   Candida albicans NOT DETECTED NOT DETECTED Final   Candida auris NOT DETECTED NOT DETECTED Final   Candida glabrata NOT DETECTED NOT DETECTED Final    Candida krusei NOT DETECTED NOT DETECTED Final   Candida parapsilosis NOT DETECTED NOT DETECTED Final   Candida tropicalis NOT DETECTED NOT DETECTED Final   Cryptococcus neoformans/gattii NOT DETECTED NOT DETECTED Final    Comment: Performed at [facility], [address], [place], [place] [address]  Aerobic/Anaerobic Culture w Gram Stain (surgical/deep wound)     Status: None (Preliminary result)   Collection Time: 10/22/22  3:43 PM   Specimen: Wound; Tissue  Result Value Ref Range Status   Specimen Description WOUND  Final   Special Requests LEFT ANKLE  Final   Gram Stain   Final    NO WBC SEEN NO ORGANISMS SEEN Performed at [facility], [address], [place], [place] [address]    Culture   Final    MODERATE METHICILLIN RESISTANT STAPHYLOCOCCUS AUREUS   Report Status PENDING  Incomplete   Organism ID, Bacteria METHICILLIN RESISTANT STAPHYLOCOCCUS AUREUS  Final      Susceptibility   Methicillin resistant staphylococcus aureus - MIC*    CIPROFLOXACIN >=8 RESISTANT Resistant     ERYTHROMYCIN >=8 RESISTANT Resistant     GENTAMICIN <=0.5 SENSITIVE Sensitive     OXACILLIN >=4 RESISTANT Resistant     TETRACYCLINE <=1 SENSITIVE Sensitive     VANCOMYCIN 1 SENSITIVE Sensitive     TRIMETH/SULFA <=10 SENSITIVE Sensitive     CLINDAMYCIN <=0.25 SENSITIVE Sensitive     RIFAMPIN <=0.5 SENSITIVE Sensitive     Inducible Clindamycin NEGATIVE Sensitive     * MODERATE METHICILLIN RESISTANT STAPHYLOCOCCUS AUREUS  Aerobic/Anaerobic Culture w Gram Stain (surgical/deep wound)     Status: None (Preliminary result)   Collection Time: 10/22/22  5:01 PM   Specimen: Wound; Tissue  Result Value Ref Range Status   Specimen Description WOUND  Final   Special Requests LEFT THIGH WOUND  Final   Gram Stain   Final    ABUNDANT WBC PRESENT, PREDOMINANTLY PMN NO ORGANISMS SEEN    Culture   Final    CULTURE REINCUBATED FOR BETTER GROWTH Performed at [facility], [address]  [place], [place] [address]    Report Status PENDING  Incomplete  Aerobic/Anaerobic Culture w Gram Stain (surgical/deep wound)     Status: None (Preliminary result)   Collection Time: 10/22/22  5:02 PM   Specimen: PATH Soft tissue  Result Value Ref Range Status   Specimen Description TISSUE  Final   Special Requests LEFT THIGH GRAFT  Final   Gram Stain   Final    ABUNDANT WBC PRESENT, PREDOMINANTLY PMN NO ORGANISMS SEEN    Culture   Final    MODERATE STREPTOCOCCUS DYSGALACTIAE FEW STAPHYLOCOCCUS AUREUS CULTURE REINCUBATED FOR BETTER GROWTH Performed at [facility], [address] [place], [place] [address]    Report Status PENDING  Incomplete   Pertinent Lab seen by me:    Latest Ref Rng & Units 10/23/2022    5:03 AM 10/22/2022   11:13 PM 10/22/2022    5:52 AM  CBC  WBC 4.0 - 10.5 K/uL 9.6  13.5    Hemoglobin 13.0 - 17.0 g/dL 9.2  16.1  09.6   Hematocrit 39.0 - 52.0 % 25.5  28.5  34.0   Platelets 150 - 400 K/uL 261  280        Latest Ref Rng & Units 10/23/2022    5:03 AM 10/22/2022   11:13 PM 10/22/2022    5:52 AM  CMP  Glucose 70 - 99 mg/dL 045   409   BUN 6 - 20 mg/dL 13   8   Creatinine 8.11 - 1.24 mg/dL 9.14  7.82  9.56   Sodium 135 - 145 mmol/L 131   132   Potassium 3.5 - 5.1 mmol/L 5.1   3.8   Chloride 98 - 111 mmol/L 97   97   CO2 22 - 32 mmol/L 23     Calcium 8.9 - 10.3 mg/dL 8.1        Pertinent Imagings/Other Imagings Plain films and CT images have been personally visualized and interpreted; radiology reports have been reviewed. Decision making incorporated into the Impression / Recommendations.  ECHOCARDIOGRAM COMPLETE  Result Date: 10/23/2022    ECHOCARDIOGRAM REPORT   Patient Name:   JESSIAH STEINHART Date of Exam: 10/23/2022 Medical Rec #:  [id number]      Height:       66.0 in Accession #:    [id number]     Weight:       211.6 lb Date of Birth:  [birth year]      BSA:          2.048 m Patient Age:    56 years       BP:           103/60 mmHg Patient Gender: M               HR:           64 bpm. Exam Location:  Inpatient Procedure: 2D Echo, Cardiac Doppler and Color Doppler Indications:    Evaluation for PFO Q21.1  History:        Patient has no prior history of Echocardiogram examinations.                 Risk Factors:Diabetes and Dyslipidemia.  Sonographer:    Lucendia Herrlich Referring Phys: [id number] EMMA M COLLINS IMPRESSIONS  1. Left ventricular ejection fraction, by estimation, is 60 to 65%. The left ventricle has normal function. The left ventricle has no regional wall motion abnormalities. Left ventricular diastolic parameters were normal.  2. Right ventricular systolic function is normal. The right ventricular size is normal.  3. Left atrial size was mildly dilated.  4. The mitral valve is grossly normal. Trivial mitral valve regurgitation.  5. The aortic valve is tricuspid. Aortic valve regurgitation is not visualized.  6. Aortic dilatation noted. There is borderline dilatation of the ascending aorta, measuring 39 mm.  7. Possible small PFO with right to left flow based on color doppler. Recommend bubble study for further evaluation or TEE with bubble study if there is clinical concern for stroke.  8. No endocarditis is noted. Comparison(s): No prior Echocardiogram. Conclusion(s)/Recommendation(s): No evidence of valvular vegetations on this transthoracic echocardiogram. Consider a transesophageal echocardiogram to exclude infective endocarditis if clinically indicated. FINDINGS  Left Ventricle: Left ventricular ejection fraction, by estimation, is 60 to 65%. The left ventricle has normal function. The left ventricle has no regional wall motion abnormalities. The left ventricular internal cavity size was normal  in size. There is  no left ventricular hypertrophy. Left ventricular diastolic parameters were normal. Right Ventricle: The right ventricular size is normal. No increase in right ventricular wall thickness. Right ventricular systolic function is normal. Left  Atrium: Left atrial size was mildly dilated. Right Atrium: Right atrial size was normal in size. Pericardium: There is no evidence of pericardial effusion. Mitral Valve: The mitral valve is grossly normal. Trivial mitral valve regurgitation. Tricuspid Valve: The tricuspid valve is grossly normal. Tricuspid valve regurgitation is trivial. Aortic Valve: The aortic valve is tricuspid. Aortic valve regurgitation is not visualized. Aortic valve peak gradient measures 13.1 mmHg. Pulmonic Valve: The pulmonic valve was grossly normal. Pulmonic valve regurgitation is trivial. Aorta: Aortic dilatation noted. There is borderline dilatation of the ascending aorta, measuring 39 mm. IAS/Shunts: Cannot exclude a small PFO.  LEFT VENTRICLE PLAX 2D LVIDd:         5.00 cm   Diastology LVIDs:         3.60 cm   LV e' medial:    10.40 cm/s LV PW:         1.10 cm   LV E/e' medial:  10.0 LV IVS:        0.80 cm   LV e' lateral:   12.10 cm/s LVOT diam:     2.00 cm   LV E/e' lateral: 8.6 LV SV:         76 LV SV Index:   37 LVOT Area:     3.14 cm                           3D Volume EF:                          3D EF:        52 %                          LV EDV:       217 ml                          LV ESV:       103 ml                          LV SV:        114 ml RIGHT VENTRICLE             IVC RV S prime:     11.30 cm/s  IVC diam: 1.90 cm TAPSE (M-mode): 2.1 cm LEFT ATRIUM           Index        RIGHT ATRIUM           Index LA diam:      4.40 cm 2.15 cm/m   RA Area:     16.40 cm LA Vol (A2C): 50.5 ml 24.65 ml/m  RA Volume:   43.50 ml  21.24 ml/m LA Vol (A4C): 74.3 ml 36.27 ml/m  AORTIC VALVE                 PULMONIC VALVE AV Area (Vmax): 1.91 cm     PR End Diast Vel: 2.55 msec AV Vmax:        181.00 cm/s AV Peak Grad:   13.1 mmHg LVOT Vmax:  110.00 cm/s LVOT Vmean:     72.733 cm/s LVOT VTI:       0.243 m  AORTA Ao Root diam: 3.10 cm Ao Asc diam:  3.90 cm MITRAL VALVE MV Area (PHT): 5.02 cm     SHUNTS MV Decel Time: 151 msec      Systemic VTI:  0.24 m MV E velocity: 104.00 cm/s  Systemic Diam: 2.00 cm MV A velocity: 58.80 cm/s MV E/A ratio:  1.77 Zoila Shutter MD Electronically signed by Zoila Shutter MD Signature Date/Time: 10/23/2022/3:14:22 PM    Final    CT ANGIO LOWER EXT BILAT W &/OR WO CONTRAST  Result Date: 10/22/2022 CLINICAL DATA:  57 year old male with history of bypass and possible infection EXAM: CT ANGIOGRAPHY OF ABDOMINAL AORTA WITH ILIOFEMORAL RUNOFF TECHNIQUE: Multidetector CT imaging of the abdomen, pelvis and lower extremities was performed using the standard protocol during bolus administration of intravenous contrast. Multiplanar CT image reconstructions and MIPs were obtained to evaluate the vascular anatomy. RADIATION DOSE REDUCTION: This exam was performed according to the departmental dose-optimization program which includes automated exposure control, adjustment of the mA and/or kV according to patient size and/or use of iterative reconstruction technique. CONTRAST:  OMNIPAQUE IOHEXOL 350 MG/ML SOLN COMPARISON:  11/07/2009 FINDINGS: VASCULAR Aorta: Unremarkable course, caliber, contour of the abdominal aorta. No dissection, aneurysm, or periaortic fluid. Mild atherosclerotic changes. Celiac: Patent, with no significant atherosclerotic changes. Left gastric artery has a separate origin from the aorta. SMA: Patent, with no significant atherosclerotic changes. Renals: - Right: Right renal artery patent. - Left: Left renal artery patent. IMA: Inferior mesenteric artery is patent. Right lower extremity: Unremarkable course, caliber, and contour of the right iliac system. No aneurysm, dissection, or occlusion. Mild atherosclerotic changes of the iliac system. Hypogastric artery is patent. Common femoral artery with minimal atherosclerosis. Profunda femoris and the thigh arteries are patent. Mild to moderate atherosclerotic changes of the right femoropopliteal system with no high-grade stenosis or occlusion.  Typical appearance of the trifurcation. Anterior tibial artery is occluded just after the origin secondary to calcified atherosclerosis. Tibioperoneal trunk is patent. Peroneal artery is occluded just after the origin secondary to calcified plaque. Posterior tibial artery demonstrates some calcified plaque though appears patent from the origin to the ankle. Left lower extremity: Unremarkable course, caliber, and contour of the left iliac system. No aneurysm, dissection, or occlusion. Mild atherosclerotic changes of the left iliac system. Hypogastric artery is patent. Common femoral artery patent with minimal atherosclerosis. Profunda femoris is patent as well as the thigh branches. Superficial femoral artery patent with moderate atherosclerotic changes, worst in the abductor canal where there is at least 50% narrowing secondary to mixed calcified and soft plaque. Above knee popliteal artery demonstrates high-grade stenosis in the region of some surgical changes secondary to concentric soft plaque. There is then dense calcified plaque with occlusion in the P2/P3 segment. Calcifications in the native tibial arteries proximally including anterior tibial artery, tibioperoneal trunk, posterior tibial artery and peroneal artery. Surgical changes of a femoral-distal bypass, from the mid segment of the superficial femoral artery to the posterior tibial artery. Graft remains patent with evidence of trace intraluminal thrombus/thickening in the distal segment at the ankle. Patency of distal tibial arteries is limited by the timing of the contrast bolus, calcified plaque, and opacification of venous structures. Veins: Unremarkable appearance of the venous system. Review of the MIP images confirms the above findings. NON-VASCULAR Lower chest: No acute. Hepatobiliary: There is some motion secondary to respiration, although there  appears to be developing nodular contour of the liver. Unremarkable gall bladder. Pancreas:  Unremarkable. Spleen: Interval development of splenomegaly with the largest diameter greater than 14 cm. Adrenals/Urinary Tract: - Right adrenal gland: Unremarkable - Left adrenal gland: Unremarkable. - Right kidney: No hydronephrosis, nephrolithiasis, inflammation, or ureteral dilation. No focal lesion. - Left Kidney: No hydronephrosis, nephrolithiasis, inflammation, or ureteral dilation. No focal lesion. - Urinary Bladder: Bladder is decompressed. Stomach/Bowel: - Stomach: Unremarkable. - Small bowel: Unremarkable - Appendix: Normal. - Colon: Left-sided diverticular disease. No acute inflammatory changes. Lymphatic: Reactive lymphadenopathy in the left inguinal region, with several borderline enlarged nodes with the largest measuring short axis 1.7 cm. Mesenteric: No free fluid or air. No mesenteric adenopathy. Reproductive: Diameter of the prostate measures 45 mm. Other: Small fat containing umbilical hernia. Trace hydrocele bilateral. Musculoskeletal: Rim enhancing fluid collection adjacent to the left sartorius muscle, overlying the proximal anastomotic site of the bypass, and along the bypass graft. The largest component estimated 28 mm x 14 mm on the axial images. Largest craniocaudal dimension approximately 86 mm. There are some additional smaller components/focal fluid more inferiorly adjacent to the graft. Ill-defined stranding within the superficial fat of the anterior left thigh. Edematous changes of the lower leg extending from below knee to the ankle in a circumferential distribution. IMPRESSION: Patent left femoral-distal bypass graft, from the mid SFA to the posterior tibial artery. There is trace filling defect within the lumen of the distal graft, compatible with small volume nonocclusive thrombus. Patency of the tibial arteries is poorly assessed on this CT given the timing the contrast bolus, native tibial artery calcifications, and venous contamination. Nonspecific rim enhancing fluid overlying  the surgical site of the left thigh, with the largest component approximately 2.8 cm x 1.4 cm by 8.6 cm. Differential includes postsurgical seroma/hematoma, as well as developing infection. Nonspecific local left thigh edema/swelling as well as generalized swelling/edema of the left lower leg. Reactive lymph nodes in the left inguinal region. Mild aortic and mild bilateral iliac arterial disease. Aortic Atherosclerosis (ICD10-I70.0). 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 Advanced right-sided tibial arterial disease, with occlusion of anterior tibial artery and peroneal artery. Advanced left femoropopliteal arterial disease, with short segment high-grade stenosis in the above knee popliteal artery, as well as occlusion secondary to calcified plaque in the P2/P3 segment. Associated advanced native left tibial arterial disease, poorly assessed on the current CT. Questionable developing cirrhosis and stigmata of portal hypertension. Additional ancillary findings as above. Signed, Yvone Neu. Miachel Roux, RPVI [facility] Electronically Signed   By: Gilmer Mor D.O.   On: 10/22/2022 12:00   DG C-Arm 1-60 Min-No Report  Result Date: 10/08/2022 Fluoroscopy was utilized by the requesting physician.  No radiographic interpretation.   VAS [place] LOWER EXTREMITY SAPHENOUS VEIN MAPPING  Result Date: 09/25/2022 LOWER EXTREMITY VEIN MAPPING Patient Name:  COBURN KNAUS  Date of Exam:   09/25/2022 Medical Rec #: [id number]       Accession #:    [id number] Date of Birth: Apr 29, 1966       Patient Gender: M Patient Age:   21 years Exam Location:  [facility] Procedure:      VAS [place] LOWER EXTREMITY SAPHENOUS VEIN MAPPING Referring Phys: Sherald Hess --------------------------------------------------------------------------------  Other Indications:  Redo left above knee to posterior tibial bypass graft                     w/greater saphenous  vein.  Risk Factors:       Diabetes, current smoker. Other Risk Factors: 01/14/21 - Left AK to posterior tibial bypass graft.  Performing Technologist: Marilynne Halsted RDMS, RVT  Examination Guidelines: A complete evaluation includes B-mode imaging, spectral Doppler, color Doppler, and power Doppler as needed of all accessible portions of each vessel. Bilateral testing is considered an integral part of a complete examination. Limited examinations for reoccurring indications may be performed as noted. +------------+-----------+---------------------+------------+------------------+ RT Diameter RT Findings         GSV         LT Diameter    LT Findings         (cm)                                        (cm)                       +------------+-----------+---------------------+------------+------------------+     0.56                  Saphenofemoral        0.19                                                    Junction                                      +------------+-----------+---------------------+------------+------------------+     0.36     branching    Proximal thigh        0.17                       +------------+-----------+---------------------+------------+------------------+     0.22                     Mid thigh                                     +------------+-----------+---------------------+------------+------------------+     0.19                   Distal thigh                                    +------------+-----------+---------------------+------------+------------------+     0.24                       Knee                                        +------------+-----------+---------------------+------------+------------------+     0.18                     Prox calf          0.20        thrombosed     +------------+-----------+---------------------+------------+------------------+     0.17  Mid calf           0.28       thrombosed and                                                               branching      +------------+-----------+---------------------+------------+------------------+     0.17                    Distal calf                                    +------------+-----------+---------------------+------------+------------------+     0.20                       Ankle                                       +------------+-----------+---------------------+------------+------------------+ +----------------+-----------+-------------+----------------+-----------+ RT diameter (cm)RT Findings     SSV     LT Diameter (cm)LT Findings +----------------+-----------+-------------+----------------+-----------+                            Proximal calf      0.33                  +----------------+-----------+-------------+----------------+-----------+                              Mid calf         0.24                  +----------------+-----------+-------------+----------------+-----------+                             Distal calf       0.21                  +----------------+-----------+-------------+----------------+-----------+ Left Tech Comments: Harvested left GSV. Diagnosing physician: Heath Lark Electronically signed by Heath Lark on 09/25/2022 at 3:53:58 PM.    Final    PERIPHERAL VASCULAR CATHETERIZATION  Result Date: 09/25/2022 Images from the original result were not included.   Patient name: GARION WEMPE            MRN: [id number]        DOB: [birth year]          Sex: male  09/25/2022 Pre-operative Diagnosis: Critical limb ischemia of the left lower extremity with tissue loss with occluded above-knee popliteal artery to posterior tibial artery bypass Post-operative diagnosis:  Same Surgeon:  Cephus Shelling, MD Procedure Performed: 1.  Ultrasound-guided access right common femoral artery 2.  Aortogram with catheter selection of aorta 3.  Bilateral lower extremity  arteriogram with selection of third-order branches in the left lower extremity 4.  Mynx closure of the right common femoral artery 5.  41 minutes of monitored moderate conscious sedation time  Indications: Patient is a 57 year old male that previously underwent a left above-knee popliteal artery to posterior tibial artery bypass for tissue loss on  01/14/2021.  This is known to be occluded after recent surveillance in the office.  He has had several months of symptoms in the left foot with a new tissue loss.  He presents today for angiogram with a focus on the left leg after risk benefits discussed.  Findings:  Aortogram showed both renal arteries widely patent with ectasia of the infrarenal aorta but no flow-limiting stenosis in the aortoiliac segment.  The left common femoral and profunda are widely patent and the proximal to mid SFA is widely patent without flow-limiting stenosis.  The distal SFA is heavily diseased and he has an occluded above-knee popliteal artery.  He reconstitutes the posterior tibial artery just above the ankle in the distal calf through collaterals.  The right common femoral and profunda are widely patent as well as the SFA above and below-knee popliteal artery.  Dominant runoff into the foot is through a diseased posterior tibial artery gives off a collateral at the ankle to the peroneal.              Procedure:  The patient was identified in the holding area and taken to room 7.  The patient was then placed supine on the table and prepped and draped in the usual sterile fashion.  A time out was called.  The patient received Versed and fentanyl for conscious moderate sedation.  Vital signs were monitored including heart rate, respiratory, oxygenation and blood pressure.  I was present for all of moderate sedation.  Ultrasound was used to evaluate the right common femoral artery.  It was patent .  A digital ultrasound image was acquired.  A micropuncture needle was used to access the right  common femoral artery under ultrasound guidance.  An 018 wire was advanced without resistance and a micropuncture sheath was placed.  The 018 wire was removed and a benson wire was placed.  The micropuncture sheath was exchanged for a 5 french sheath.  An omniflush catheter was advanced over the wire to the level of L-1.  An abdominal angiogram was obtained.  Next the catheter was pulled down and bilateral lower extremity runoff was obtained.  Pertinent findings are noted above.  In order to get better dedicated imaging on the left for target for redo bypass, I did cross the aortic bifurcation and got my Omni Flush catheter into the left SFA.  We got some additional dedicated imaging on the left including a lateral foot shot.  Ultimately patient will need redo bypass on the left.  Wires and catheters were removed.  A minx closure device was deployed in the right groin.  Plan: Will get updated vein mapping.  Patient will be scheduled for redo left femoral to PT bypass.     Cephus Shelling, MD [facility]: [phone]       I spent at least 100  minutes for this patient encounter including review of prior medical records/discussing diagnostics and treatment plan with the patient/family/coordinate care with primary/other specialits with greater than 50% of time in face to face encounter.   Electronically signed by:   Odette Fraction, MD Infectious Disease Physician [facility] Pager: [phone]

## 2022-10-24 NOTE — Progress Notes (Signed)
[facility] [place]  Subjective  -states his left leg feels a lot better   Objective 106/65 67 (!) 97.5 F (36.4 C) (Oral) 16 98%  Intake/Output Summary (Last 24 hours) at 10/24/2022 0931 Last data filed at 10/24/2022 0754 Gross per 24 hour  Intake 3187.95 ml  Output 3275 ml  Net -87.05 ml    Left PT brisk by Doppler at the ankle Penrose drains left leg through previous bypass site  Laboratory Lab Results: Recent Labs    10/22/22 2313 10/23/22 0503  WBC 13.5* 9.6  HGB 10.0* 9.2*  HCT 28.5* 25.5*  PLT 280 261   BMET Recent Labs    10/22/22 0542 10/22/22 0552 10/22/22 2313 10/23/22 0503  NA 131* 132*  --  131*  K 3.8 3.8  --  5.1  CL 96* 97*  --  97*  CO2 23  --   --  23  GLUCOSE 148* 150*  --  305*  BUN 8 8  --  13  CREATININE 0.90 0.70 0.85 0.83  CALCIUM 8.7*  --   --  8.1*    COAG Lab Results  Component Value Date   INR 1.2 10/22/2022   INR 1.0 10/08/2022   INR 0.95 06/06/2009   No results found for: "PTT"  Assessment/Planning:  Postop day 2 status post excision of infected PTFE bypass with redo left SFA to posterior tibial bypass with CryoVein.  OR cultures growing staph aureus and strep dysgalactiae.  Also has positive blood cultures for strep.  On broad-spectrum antibiotics.  Will consult ID.  Dressings changed today and his incisions all appear to be healing.  He has excellent Doppler signal at the ankle.  Will leave Penrose drains for now.  Aspirin Plavix statin.  BRISON FIUMARA 10/24/2022 9:31 AM --

## 2022-10-25 LAB — GLUCOSE, CAPILLARY
Glucose-Capillary: 107 mg/dL — ABNORMAL HIGH (ref 70–99)
Glucose-Capillary: 173 mg/dL — ABNORMAL HIGH (ref 70–99)
Glucose-Capillary: 154 mg/dL — ABNORMAL HIGH (ref 70–99)
Glucose-Capillary: 102 mg/dL — ABNORMAL HIGH (ref 70–99)
Glucose-Capillary: 124 mg/dL — ABNORMAL HIGH (ref 70–99)

## 2022-10-25 LAB — CULTURE, BLOOD (ROUTINE X 2)
Special Requests: ADEQUATE
Special Requests: ADEQUATE

## 2022-10-25 LAB — AEROBIC/ANAEROBIC CULTURE W GRAM STAIN (SURGICAL/DEEP WOUND)

## 2022-10-25 LAB — CK: Total CK: 25 U/L — ABNORMAL LOW (ref 49–397)

## 2022-10-25 NOTE — Plan of Care (Signed)
  Problem: Activity: Goal: Ability to return to baseline activity level will improve Outcome: Progressing   

## 2022-10-25 NOTE — Progress Notes (Signed)
Mobility Specialist: Progress Note   10/25/22 1347  Mobility  Activity Ambulated with assistance in hallway  Level of Assistance Contact guard assist, steadying assist  Assistive Device Front wheel walker  Distance Ambulated (ft) 180 ft  Activity Response Tolerated well  Mobility Referral Yes  $Mobility charge 1 Mobility   Post-Mobility: 76 HR, 114/76 (79)  BP, 100% SpO2  Pt received in the bed and agreeable to mobility. Mod I with bed mobility and contact guard during ambulation. To BR per request, BM successful, then hallway ambulation. No c/o throughout. Pt to the chair after session with call bell and phone in reach.   Tony Hicks Mobility Specialist Please contact via SecureChat or Rehab office at [phone]

## 2022-10-25 NOTE — Progress Notes (Addendum)
Progress Note    10/25/2022 7:09 AM 3 Days Post-Op  Subjective:  no complaints  Afebrile HR 60's-70's NSR 100's-120's systolic 100% RA  Vitals:   10/24/22 2319 10/25/22 0305  BP: 114/70 114/75  Pulse: 70 66  Resp: (!) 22 20  Temp: 98.1 F (36.7 C) 97.6 F (36.4 C)  SpO2: 95% 100%    Physical Exam: General:  no distress Cardiac:  regular Lungs:  non labored Incisions:  leg incisions look good.  No purulent drainage.  Some mild bloody drainage.  Penrose drains in place Extremities:  brisk left PT doppler signal   CBC    Component Value Date/Time   WBC 9.6 10/23/2022 0503   RBC 2.84 (L) 10/23/2022 0503   HGB 9.2 (L) 10/23/2022 0503   HGB 14.7 09/19/2022 1209   HCT 25.5 (L) 10/23/2022 0503   HCT 42.1 09/19/2022 1209   PLT 261 10/23/2022 0503   PLT 231 09/19/2022 1209   MCV 89.8 10/23/2022 0503   MCV 92 09/19/2022 1209   MCH 32.4 10/23/2022 0503   MCHC 36.1 (H) 10/23/2022 0503   RDW 11.9 10/23/2022 0503   RDW 12.8 09/19/2022 1209   LYMPHSABS 1.1 10/22/2022 0542   LYMPHSABS 1.7 03/14/2021 1204   MONOABS 0.9 10/22/2022 0542   EOSABS 0.0 10/22/2022 0542   EOSABS 0.1 03/14/2021 1204   BASOSABS 0.1 10/22/2022 0542   BASOSABS 0.0 03/14/2021 1204    BMET    Component Value Date/Time   NA 131 (L) 10/23/2022 0503   NA 144 09/19/2022 1209   K 5.1 10/23/2022 0503   CL 97 (L) 10/23/2022 0503   CO2 23 10/23/2022 0503   GLUCOSE 305 (H) 10/23/2022 0503   BUN 13 10/23/2022 0503   BUN 11 09/19/2022 1209   CREATININE 0.83 10/23/2022 0503   CALCIUM 8.1 (L) 10/23/2022 0503   GFRNONAA >60 10/23/2022 0503   GFRAA 101 02/17/2019 1543    INR    Component Value Date/Time   INR 1.2 10/22/2022 0542     Intake/Output Summary (Last 24 hours) at 10/25/2022 0709 Last data filed at 10/25/2022 0538 Gross per 24 hour  Intake 2291.22 ml  Output 3925 ml  Net -1633.78 ml    Repeat blood cultures 4/19 in process  Specimen Description TISSUE  Special Requests LEFT  THIGH GRAFT  Gram Stain ABUNDANT WBC PRESENT, PREDOMINANTLY PMN NO ORGANISMS SEEN Performed at [facility], [address], [place], [place] [address]  Culture MODERATE STREPTOCOCCUS GROUP G FEW STAPHYLOCOCCUS AUREUS SUSCEPTIBILITIES TO FOLLOW NO ANAEROBES ISOLATED; CULTURE IN PROGRESS FOR 5 DAYS  Report Status PENDING      Component 3 d ago  Specimen Description WOUND  Special Requests LEFT THIGH WOUND  Gram Stain ABUNDANT WBC PRESENT, PREDOMINANTLY PMN NO ORGANISMS SEEN Performed at [facility], [address] [place], [place] [address]  Culture FEW STREPTOCOCCUS GROUP G Beta hemolytic streptococci are predictably susceptible to penicillin and other beta lactams. Susceptibility testing not routinely performed. NO ANAEROBES ISOLATED; CULTURE IN PROGRESS FOR 5 DAYS   Component 3 d ago  Specimen Description WOUND  Special Requests LEFT ANKLE  Gram Stain NO WBC SEEN NO ORGANISMS SEEN Performed at [facility], [address] [place], [place] [address]  Culture MODERATE METHICILLIN RESISTANT STAPHYLOCOCCUS AUREUS CULTURE REINCUBATED FOR BETTER GROWTH NO ANAEROBES ISOLATED; CULTURE IN PROGRESS FOR 5 DAYS  Report Status PENDING  Organism ID, Bacteria METHICILLIN RESISTANT STAPHYLOCOCCUS AUREUS  Resulting Agency [facility]  Susceptibility   Methicillin resistant staphylococcus aureus    MIC    CIPROFLOXACIN >=8 RESISTANT Resistant    CLINDAMYCIN <=0.25 SENS... Sensitive    ERYTHROMYCIN >=8 RESISTANT Resistant    GENTAMICIN <=0.5 SENSI... Sensitive    Inducible Clindamycin NEGATIVE Sensitive    OXACILLIN >=4 RESISTANT Resistant    RIFAMPIN <=0.5 SENSI... Sensitive    TETRACYCLINE <=1 SENSITIVE Sensitive    TRIMETH/SULFA <=10 SENSIT... Sensitive    VANCOMYCIN 1 SENSITIVE Sensitive                  Assessment/Plan:  57 y.o. male is s/p:  excision of infected PTFE bypass with redo left SFA to posterior tibial bypass with CryoVein   3 Days  Post-Op   -pt dressings removed and incisions look good.  No purulence present.  Some mild bloody drainage on bandages.  Proximal penrose drain removed.   -appreciate ID consult-abx changed to daptomycin and unasyn.  Will need 6 weeks of abx but will not need po suppression as graft was removed in its entirety.  -continue asa/statin/plavix -DVT prophylaxis:  sq heparin -mobilize out of bed to chair tid at meal times and ambulate in hallway tid.   Doreatha Massed, PA-C [facility] [phone] 10/25/2022 7:09 AM  I have seen and evaluated the patient. I agree with the PA note as documented above.  Admitted with infected left SFA to PT PTFE redo bypass.  This has been excised and replaced with CryoVein.  Left leg dressings changed at bedside.  Has brisk PT signal at the ankle.  1 Penrose drain removed.  Cultures growing MRSA and strep, also positive blood cultures.  Appreciate ID input.  Plan 6 weeks of IV antibiotics.  Now on Unasyn and daptomycin.  Out of bed and mobilize.  Cephus Shelling, MD [facility] [place] Office: [phone]

## 2022-10-26 LAB — GLUCOSE, CAPILLARY
Glucose-Capillary: 154 mg/dL — ABNORMAL HIGH (ref 70–99)
Glucose-Capillary: 107 mg/dL — ABNORMAL HIGH (ref 70–99)
Glucose-Capillary: 128 mg/dL — ABNORMAL HIGH (ref 70–99)
Glucose-Capillary: 151 mg/dL — ABNORMAL HIGH (ref 70–99)

## 2022-10-26 LAB — CULTURE, BLOOD (ROUTINE X 2)

## 2022-10-26 NOTE — Progress Notes (Addendum)
  Progress Note    10/26/2022 9:49 AM 4 Days Post-Op  Subjective:  resting - wakes easily.  No complaints  afebrile  Vitals:   10/26/22 0500 10/26/22 0846  BP: 113/79 118/76  Pulse: 63 68  Resp: 18 17  Temp: 97.9 F (36.6 C) (!) 97.5 F (36.4 C)  SpO2: 100% 100%    Physical Exam: General:  no distress Cardiac:  regular Lungs:  non labored Incisions:  incisions healing with staples in tact Extremities:  brisk doppler flow left PT   CBC    Component Value Date/Time   WBC 9.6 10/23/2022 0503   RBC 2.84 (L) 10/23/2022 0503   HGB 9.2 (L) 10/23/2022 0503   HGB 14.7 09/19/2022 1209   HCT 25.5 (L) 10/23/2022 0503   HCT 42.1 09/19/2022 1209   PLT 261 10/23/2022 0503   PLT 231 09/19/2022 1209   MCV 89.8 10/23/2022 0503   MCV 92 09/19/2022 1209   MCH 32.4 10/23/2022 0503   MCHC 36.1 (H) 10/23/2022 0503   RDW 11.9 10/23/2022 0503   RDW 12.8 09/19/2022 1209   LYMPHSABS 1.1 10/22/2022 0542   LYMPHSABS 1.7 03/14/2021 1204   MONOABS 0.9 10/22/2022 0542   EOSABS 0.0 10/22/2022 0542   EOSABS 0.1 03/14/2021 1204   BASOSABS 0.1 10/22/2022 0542   BASOSABS 0.0 03/14/2021 1204    BMET    Component Value Date/Time   NA 131 (L) 10/23/2022 0503   NA 144 09/19/2022 1209   K 5.1 10/23/2022 0503   CL 97 (L) 10/23/2022 0503   CO2 23 10/23/2022 0503   GLUCOSE 305 (H) 10/23/2022 0503   BUN 13 10/23/2022 0503   BUN 11 09/19/2022 1209   CREATININE 0.83 10/23/2022 0503   CALCIUM 8.1 (L) 10/23/2022 0503   GFRNONAA >60 10/23/2022 0503   GFRAA 101 02/17/2019 1543    INR    Component Value Date/Time   INR 1.2 10/22/2022 0542     Intake/Output Summary (Last 24 hours) at 10/26/2022 0949 Last data filed at 10/26/2022 0901 Gross per 24 hour  Intake 1515.82 ml  Output 2300 ml  Net -784.18 ml      Assessment/Plan:  57 y.o. male is s/p:  excision of infected PTFE bypass with redo left SFA to posterior tibial bypass with CryoVein   4 Days Post-Op   -dressings removed  today and drain removed.  Incisions slowly improving.  Brisk doppler flow left PT. -continue daily dressing changes -PICC and home IV abx per ID-appreciate assistance -DVT prophylaxis:  sq heparin -out of bed tid   Doreatha Massed, PA-C [facility] [phone] 10/26/2022 9:49 AM  I have seen and evaluated the patient. I agree with the PA note as documented above.  Admitted with infected left SFA to PT PTFE redo bypass.  This has been excised and replaced with CryoVein.  Left leg dressings changed at bedside.  Has brisk PT signal at the ankle.  Remaining Penrose drain removed.  Cultures growing MRSA and strep, also positive blood cultures.  Appreciate ID input.  Plan 6 weeks of IV antibiotics.  Now on Unasyn and daptomycin.  Out of bed and mobilize. PICC line tomorrow.  Moving closer to discharge.  Cephus Shelling, MD [facility] [place] Office: [phone]

## 2022-10-26 NOTE — Plan of Care (Signed)
  Problem: Activity: Goal: Ability to tolerate increased activity will improve Outcome: Progressing   

## 2022-10-27 ENCOUNTER — Other Ambulatory Visit (HOSPITAL_COMMUNITY): Payer: Self-pay

## 2022-10-27 ENCOUNTER — Other Ambulatory Visit: Payer: Self-pay

## 2022-10-27 DIAGNOSIS — Z9889 Other specified postprocedural states: Secondary | ICD-10-CM | POA: Diagnosis not present

## 2022-10-27 LAB — AEROBIC/ANAEROBIC CULTURE W GRAM STAIN (SURGICAL/DEEP WOUND)

## 2022-10-27 LAB — GLUCOSE, CAPILLARY
Glucose-Capillary: 132 mg/dL — ABNORMAL HIGH (ref 70–99)
Glucose-Capillary: 138 mg/dL — ABNORMAL HIGH (ref 70–99)
Glucose-Capillary: 120 mg/dL — ABNORMAL HIGH (ref 70–99)
Glucose-Capillary: 124 mg/dL — ABNORMAL HIGH (ref 70–99)

## 2022-10-27 LAB — CULTURE, BLOOD (ROUTINE X 2)
Culture: NO GROWTH
Culture: NO GROWTH

## 2022-10-27 MED ORDER — CEFADROXIL 500 MG PO CAPS
1000.0000 mg | ORAL_CAPSULE | Freq: Two times a day (BID) | ORAL | 0 refills | Status: AC
  Filled 2022-10-27: qty 92, 23d supply, fill #0

## 2022-10-27 MED ORDER — DOXYCYCLINE HYCLATE 100 MG PO TABS
100.0000 mg | ORAL_TABLET | Freq: Two times a day (BID) | ORAL | Status: DC
  Administered 2022-10-27 – 2022-10-29 (×4): 100 mg via ORAL
  Filled 2022-10-27 (×4): qty 1

## 2022-10-27 MED ORDER — FERROUS SULFATE 325 (65 FE) MG PO TABS
325.0000 mg | ORAL_TABLET | Freq: Every day | ORAL | Status: DC
  Administered 2022-10-28 – 2022-10-29 (×2): 325 mg via ORAL
  Filled 2022-10-27 (×2): qty 1

## 2022-10-27 MED ORDER — DOXYCYCLINE HYCLATE 100 MG PO TABS
100.0000 mg | ORAL_TABLET | Freq: Two times a day (BID) | ORAL | 0 refills | Status: DC
  Filled 2022-10-27: qty 46, 23d supply, fill #0

## 2022-10-27 MED ORDER — CEFADROXIL 500 MG PO CAPS
1000.0000 mg | ORAL_CAPSULE | Freq: Two times a day (BID) | ORAL | Status: DC
  Administered 2022-10-27 – 2022-10-29 (×4): 1000 mg via ORAL
  Filled 2022-10-27 (×6): qty 2

## 2022-10-27 NOTE — Progress Notes (Addendum)
  Progress Note    10/27/2022 8:55 AM 5 Days Post-Op  Subjective:  no complaints   Vitals:   10/27/22 0313 10/27/22 0833  BP: 117/73 117/77  Pulse: 60 65  Resp: 18   Temp: 98 F (36.7 C) 97.8 F (36.6 C)  SpO2: 98% 97%   Physical Exam: Cardiac:  regular Lungs:  non labored Incisions:  left leg incisions intact. There are several areas where there is some separation of the skin, staples intact. No active bleeding or drainage. Dry 4x4s, kerlix and Ace reapplied Extremities: LLE well perfused and warm with brisk doppler PT signal Neurologic: alert and oriented  CBC    Component Value Date/Time   WBC 9.6 10/23/2022 0503   RBC 2.84 (L) 10/23/2022 0503   HGB 9.2 (L) 10/23/2022 0503   HGB 14.7 09/19/2022 1209   HCT 25.5 (L) 10/23/2022 0503   HCT 42.1 09/19/2022 1209   PLT 261 10/23/2022 0503   PLT 231 09/19/2022 1209   MCV 89.8 10/23/2022 0503   MCV 92 09/19/2022 1209   MCH 32.4 10/23/2022 0503   MCHC 36.1 (H) 10/23/2022 0503   RDW 11.9 10/23/2022 0503   RDW 12.8 09/19/2022 1209   LYMPHSABS 1.1 10/22/2022 0542   LYMPHSABS 1.7 03/14/2021 1204   MONOABS 0.9 10/22/2022 0542   EOSABS 0.0 10/22/2022 0542   EOSABS 0.1 03/14/2021 1204   BASOSABS 0.1 10/22/2022 0542   BASOSABS 0.0 03/14/2021 1204    BMET    Component Value Date/Time   NA 131 (L) 10/23/2022 0503   NA 144 09/19/2022 1209   K 5.1 10/23/2022 0503   CL 97 (L) 10/23/2022 0503   CO2 23 10/23/2022 0503   GLUCOSE 305 (H) 10/23/2022 0503   BUN 13 10/23/2022 0503   BUN 11 09/19/2022 1209   CREATININE 0.83 10/23/2022 0503   CALCIUM 8.1 (L) 10/23/2022 0503   GFRNONAA >60 10/23/2022 0503   GFRAA 101 02/17/2019 1543    INR    Component Value Date/Time   INR 1.2 10/22/2022 0542     Intake/Output Summary (Last 24 hours) at 10/27/2022 0855 Last data filed at 10/27/2022 0500 Gross per 24 hour  Intake 1282 ml  Output 3025 ml  Net -1743 ml     Assessment/Plan:  57 y.o. male is s/p excision of infected  PTFE bypass with redo left SFA to posterior tibial bypass with CryoVein  5 Days Post-Op   LLE remains well perfused and warm with brisk doppler PT signal Incisions intact. Dry dressings applied Plan is for PICC line today On Unasyn and Daptomycin Will need 6 weeks of total abx Appreciate ID assistance Mobilize as tolerated Hopefully will be able to d/c in the next day or two   [facility] [phone] 10/27/2022 8:55 AM  I have seen and evaluated the patient. I agree with the PA note as documented above.  Will order PICC line for 6 weeks of IV antibiotics as directed by ID.  Making good progress.  All the drains removed over the weekend.  Brisk PT signal at the ankle after redo bypass with CryoVein.  Cephus Shelling, MD [facility] [place] Office: [phone]

## 2022-10-27 NOTE — Progress Notes (Signed)
[facility]   Reason for visit: Follow up on wound infection  Interval History: remains afebrile.  Continues on daptomycin and ampicillin/sulbactam.     Physical Exam: Constitutional:  Vitals:   10/27/22 0833 10/27/22 1131  BP: 117/77 125/64  Pulse: 65 66  Resp:    Temp: 97.8 F (36.6 C) (!) 97.5 F (36.4 C)  SpO2: 97% 96%   patient appears in NAD Respiratory: Normal respiratory effort  Review of Systems: Constitutional: negative for fevers and chills  Lab Results  Component Value Date   WBC 9.6 10/23/2022   HGB 9.2 (L) 10/23/2022   HCT 25.5 (L) 10/23/2022   MCV 89.8 10/23/2022   PLT 261 10/23/2022    Lab Results  Component Value Date   CREATININE 0.83 10/23/2022   BUN 13 10/23/2022   NA 131 (L) 10/23/2022   K 5.1 10/23/2022   CL 97 (L) 10/23/2022   CO2 23 10/23/2022    Lab Results  Component Value Date   ALT 21 10/22/2022   AST 23 10/22/2022   ALKPHOS 65 10/22/2022     Microbiology: Recent Results (from the past 240 hour(s))  Blood Culture (routine x 2)     Status: None   Collection Time: 10/22/22  5:41 AM   Specimen: BLOOD  Result Value Ref Range Status   Specimen Description BLOOD LEFT ANTECUBITAL  Final   Special Requests   Final    BOTTLES DRAWN AEROBIC AND ANAEROBIC Blood Culture adequate volume   Culture   Final    NO GROWTH 5 DAYS Performed at [facility], [address] [place], [place] [address]    Report Status 10/27/2022 FINAL  Final  Blood Culture (routine x 2)     Status: Abnormal   Collection Time: 10/22/22  5:42 AM   Specimen: BLOOD  Result Value Ref Range Status   Specimen Description BLOOD LEFT ANTECUBITAL  Final   Special Requests   Final    BOTTLES DRAWN AEROBIC AND ANAEROBIC Blood Culture adequate volume   Culture  Setup Time   Final    GRAM POSITIVE COCCI IN CHAINS IN BOTH AEROBIC AND ANAEROBIC BOTTLES CRITICAL RESULT CALLED TO, READ BACK BY AND VERIFIED WITH: PHARMD MADELEINE  MITCHELL ON 10/19/22 @ 2104 BY DRT Performed at [facility], [address], [place], [place] [address]    Culture STREPTOCOCCUS GROUP G (A)  Final   Report Status 10/24/2022 FINAL  Final   Organism ID, Bacteria STREPTOCOCCUS GROUP G  Final      Susceptibility   Streptococcus group g - MIC*    CLINDAMYCIN RESISTANT Resistant     AMPICILLIN <=0.25 SENSITIVE Sensitive     ERYTHROMYCIN >=8 RESISTANT Resistant     VANCOMYCIN 0.5 SENSITIVE Sensitive     CEFTRIAXONE <=0.12 SENSITIVE Sensitive     LEVOFLOXACIN 0.5 SENSITIVE Sensitive     PENICILLIN <=0.06 SENSITIVE Sensitive     * STREPTOCOCCUS GROUP G  Blood Culture ID Panel (Reflexed)     Status: Abnormal   Collection Time: 10/22/22  5:42 AM  Result Value Ref Range Status   Enterococcus faecalis NOT DETECTED NOT DETECTED Final   Enterococcus Faecium NOT DETECTED NOT DETECTED Final   Listeria monocytogenes NOT DETECTED NOT DETECTED Final   Staphylococcus species NOT DETECTED NOT DETECTED Final   Staphylococcus aureus (BCID) NOT DETECTED NOT DETECTED Final   Staphylococcus epidermidis NOT DETECTED NOT DETECTED Final   Staphylococcus lugdunensis NOT DETECTED NOT DETECTED  Final   Streptococcus species DETECTED (A) NOT DETECTED Final    Comment: Not Enterococcus species, Streptococcus agalactiae, Streptococcus pyogenes, or Streptococcus pneumoniae. CRITICAL RESULT CALLED TO, READ BACK BY AND VERIFIED WITH: PHARMD MADELEINE MITCHELL ON 10/19/22 @ 2104 BY DRT    Streptococcus agalactiae NOT DETECTED NOT DETECTED Final   Streptococcus pneumoniae NOT DETECTED NOT DETECTED Final   Streptococcus pyogenes NOT DETECTED NOT DETECTED Final   A.calcoaceticus-baumannii NOT DETECTED NOT DETECTED Final   Bacteroides fragilis NOT DETECTED NOT DETECTED Final   Enterobacterales NOT DETECTED NOT DETECTED Final   Enterobacter cloacae complex NOT DETECTED NOT DETECTED Final   Escherichia coli NOT DETECTED NOT DETECTED Final   Klebsiella aerogenes NOT  DETECTED NOT DETECTED Final   Klebsiella oxytoca NOT DETECTED NOT DETECTED Final   Klebsiella pneumoniae NOT DETECTED NOT DETECTED Final   Proteus species NOT DETECTED NOT DETECTED Final   Salmonella species NOT DETECTED NOT DETECTED Final   Serratia marcescens NOT DETECTED NOT DETECTED Final   Haemophilus influenzae NOT DETECTED NOT DETECTED Final   Neisseria meningitidis NOT DETECTED NOT DETECTED Final   Pseudomonas aeruginosa NOT DETECTED NOT DETECTED Final   Stenotrophomonas maltophilia NOT DETECTED NOT DETECTED Final   Candida albicans NOT DETECTED NOT DETECTED Final   Candida auris NOT DETECTED NOT DETECTED Final   Candida glabrata NOT DETECTED NOT DETECTED Final   Candida krusei NOT DETECTED NOT DETECTED Final   Candida parapsilosis NOT DETECTED NOT DETECTED Final   Candida tropicalis NOT DETECTED NOT DETECTED Final   Cryptococcus neoformans/gattii NOT DETECTED NOT DETECTED Final    Comment: Performed at [facility], [address], [place], [place] [address]  Aerobic/Anaerobic Culture w Gram Stain (surgical/deep wound)     Status: None   Collection Time: 10/22/22  3:43 PM   Specimen: Wound; Tissue  Result Value Ref Range Status   Specimen Description WOUND  Final   Special Requests LEFT ANKLE  Final   Gram Stain NO WBC SEEN NO ORGANISMS SEEN   Final   Culture   Final    MODERATE METHICILLIN RESISTANT STAPHYLOCOCCUS AUREUS FEW STREPTOCOCCUS GROUP G Beta hemolytic streptococci are predictably susceptible to penicillin and other beta lactams. Susceptibility testing not routinely performed. NO ANAEROBES ISOLATED Performed at [facility], [address], [place], [place] [address]    Report Status 10/27/2022 FINAL  Final   Organism ID, Bacteria METHICILLIN RESISTANT STAPHYLOCOCCUS AUREUS  Final      Susceptibility   Methicillin resistant staphylococcus aureus - MIC*    CIPROFLOXACIN >=8 RESISTANT Resistant     ERYTHROMYCIN >=8 RESISTANT Resistant      GENTAMICIN <=0.5 SENSITIVE Sensitive     OXACILLIN >=4 RESISTANT Resistant     TETRACYCLINE <=1 SENSITIVE Sensitive     VANCOMYCIN 1 SENSITIVE Sensitive     TRIMETH/SULFA <=10 SENSITIVE Sensitive     CLINDAMYCIN <=0.25 SENSITIVE Sensitive     RIFAMPIN <=0.5 SENSITIVE Sensitive     Inducible Clindamycin NEGATIVE Sensitive     * MODERATE METHICILLIN RESISTANT STAPHYLOCOCCUS AUREUS  Aerobic/Anaerobic Culture w Gram Stain (surgical/deep wound)     Status: None (Preliminary result)   Collection Time: 10/22/22  5:01 PM   Specimen: Wound; Tissue  Result Value Ref Range Status   Specimen Description WOUND  Final   Special Requests LEFT THIGH WOUND  Final   Gram Stain   Final    ABUNDANT WBC PRESENT, PREDOMINANTLY PMN NO ORGANISMS SEEN Performed at [facility], [address], [place], [place] [address]    Culture   Final    FEW STREPTOCOCCUS GROUP G Beta hemolytic streptococci are predictably susceptible to penicillin and other beta lactams. Susceptibility testing not routinely performed. NO ANAEROBES ISOLATED; CULTURE IN PROGRESS FOR 5 DAYS    Report Status PENDING  Incomplete  Aerobic/Anaerobic Culture w Gram Stain (surgical/deep wound)     Status: None   Collection Time: 10/22/22  5:02 PM   Specimen: PATH Soft tissue  Result Value Ref Range Status   Specimen Description TISSUE  Final   Special Requests LEFT THIGH GRAFT  Final   Gram Stain   Final    ABUNDANT WBC PRESENT, PREDOMINANTLY PMN NO ORGANISMS SEEN    Culture   Final    FEW STAPHYLOCOCCUS AUREUS MODERATE STREPTOCOCCUS GROUP G Beta hemolytic streptococci are predictably susceptible to penicillin and other beta lactams. Susceptibility testing not routinely performed. NO ANAEROBES ISOLATED Performed at [facility], [address], [place], [place] [address]    Report Status 10/27/2022 FINAL  Final   Organism ID, Bacteria STAPHYLOCOCCUS AUREUS  Final      Susceptibility   Staphylococcus aureus - MIC*     CIPROFLOXACIN <=0.5 SENSITIVE Sensitive     ERYTHROMYCIN >=8 RESISTANT Resistant     GENTAMICIN <=0.5 SENSITIVE Sensitive     OXACILLIN <=0.25 SENSITIVE Sensitive     TETRACYCLINE <=1 SENSITIVE Sensitive     VANCOMYCIN 1 SENSITIVE Sensitive     TRIMETH/SULFA <=10 SENSITIVE Sensitive     CLINDAMYCIN RESISTANT Resistant     RIFAMPIN <=0.5 SENSITIVE Sensitive     Inducible Clindamycin POSITIVE Resistant     * FEW STAPHYLOCOCCUS AUREUS  Culture, blood (Routine X 2) w Reflex to ID Panel     Status: None (Preliminary result)   Collection Time: 10/24/22  9:41 AM   Specimen: BLOOD LEFT ARM  Result Value Ref Range Status   Specimen Description BLOOD LEFT ARM  Final   Special Requests   Final    BOTTLES DRAWN AEROBIC AND ANAEROBIC Blood Culture adequate volume   Culture   Final    NO GROWTH 3 DAYS Performed at [facility], [address] [place], [place] [address]    Report Status PENDING  Incomplete  Culture, blood (Routine X 2) w Reflex to ID Panel     Status: None (Preliminary result)   Collection Time: 10/24/22  9:46 AM   Specimen: BLOOD LEFT ARM  Result Value Ref Range Status   Specimen Description BLOOD LEFT ARM  Final   Special Requests   Final    BOTTLES DRAWN AEROBIC AND ANAEROBIC Blood Culture adequate volume   Culture   Final    NO GROWTH 3 DAYS Performed at [facility], [address] [place], [place] [address]    Report Status PENDING  Incomplete    Impression/Plan:  1. 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  Cultures noted with Streptococcus group G in both the debridement site and one blood culture set as well as MSSA and MRSA in different wound cultures, now final.  I recommend to continue with oral doxycycline 100 mg twice a day and cefadroxil 1000 mg twice a day for 4 weeks  total.  He will follow up 5/16  2.  Tobacco abuse - he  is interested in quitting and information given for the [place] quit line.    3.  Medication monitoring - creat wnl.  No antibiotic adjustments indicated.    I will otherwise sign off, call with questions.

## 2022-10-27 NOTE — Progress Notes (Signed)
Mobility Specialist Progress Note:   10/27/22 1045  Mobility  Activity Ambulated with assistance in hallway  Level of Assistance Standby assist, set-up cues, supervision of patient - no hands on  Assistive Device None;Other (Comment) (Hallway rails)  Distance Ambulated (ft) 150 ft  Activity Response Tolerated well  Mobility Referral Yes  $Mobility charge 1 Mobility   Pt agreeable to mobility session. Required no physical assist throughout. Ambulated with no AD use, occasionally reached for hallway rails for comfort. No c/o LLE pain throughout session. Pt back in bed with all needs met.  Addison Lank Mobility Specialist Please contact via SecureChat or  Rehab office at [phone]

## 2022-10-27 NOTE — Addendum Note (Signed)
Addendum  created 10/27/22 1529 by Val Eagle, MD   Attestation recorded in Intraprocedure, Intraprocedure Attestations filed

## 2022-10-28 ENCOUNTER — Other Ambulatory Visit (HOSPITAL_COMMUNITY): Payer: Self-pay

## 2022-10-28 ENCOUNTER — Other Ambulatory Visit: Payer: Self-pay

## 2022-10-28 LAB — GLUCOSE, CAPILLARY
Glucose-Capillary: 110 mg/dL — ABNORMAL HIGH (ref 70–99)
Glucose-Capillary: 124 mg/dL — ABNORMAL HIGH (ref 70–99)
Glucose-Capillary: 132 mg/dL — ABNORMAL HIGH (ref 70–99)
Glucose-Capillary: 142 mg/dL — ABNORMAL HIGH (ref 70–99)
Glucose-Capillary: 159 mg/dL — ABNORMAL HIGH (ref 70–99)
Glucose-Capillary: 238 mg/dL — ABNORMAL HIGH (ref 70–99)

## 2022-10-28 NOTE — Progress Notes (Addendum)
Progress Note    10/28/2022 7:58 AM 6 Days Post-Op  Subjective:  no complaints. Says he ambulated in hall yesterday. Pain overall well controlled. He does not yet feel comfortable going home today   Vitals:   10/27/22 1630 10/27/22 2033  BP: 119/75 128/69  Pulse: 76 68  Resp:  18  Temp: 97.7 F (36.5 C) 98.3 F (36.8 C)  SpO2: 97% 97%   Physical Exam: Cardiac:  regular Lungs:  non labored Incisions:  left leg incisions are intact. There is small separation in proximal aspect of distal incision where drain was that is oozing. Dry dressings applied to left leg, kerlix and ACE Extremities:  Doppler PT and DP signals in left foot. Motor and sensation intact Abdomen:  soft Neurologic: alert and oriented  CBC    Component Value Date/Time   WBC 9.6 10/23/2022 0503   RBC 2.84 (L) 10/23/2022 0503   HGB 9.2 (L) 10/23/2022 0503   HGB 14.7 09/19/2022 1209   HCT 25.5 (L) 10/23/2022 0503   HCT 42.1 09/19/2022 1209   PLT 261 10/23/2022 0503   PLT 231 09/19/2022 1209   MCV 89.8 10/23/2022 0503   MCV 92 09/19/2022 1209   MCH 32.4 10/23/2022 0503   MCHC 36.1 (H) 10/23/2022 0503   RDW 11.9 10/23/2022 0503   RDW 12.8 09/19/2022 1209   LYMPHSABS 1.1 10/22/2022 0542   LYMPHSABS 1.7 03/14/2021 1204   MONOABS 0.9 10/22/2022 0542   EOSABS 0.0 10/22/2022 0542   EOSABS 0.1 03/14/2021 1204   BASOSABS 0.1 10/22/2022 0542   BASOSABS 0.0 03/14/2021 1204    BMET    Component Value Date/Time   NA 131 (L) 10/23/2022 0503   NA 144 09/19/2022 1209   K 5.1 10/23/2022 0503   CL 97 (L) 10/23/2022 0503   CO2 23 10/23/2022 0503   GLUCOSE 305 (H) 10/23/2022 0503   BUN 13 10/23/2022 0503   BUN 11 09/19/2022 1209   CREATININE 0.83 10/23/2022 0503   CALCIUM 8.1 (L) 10/23/2022 0503   GFRNONAA >60 10/23/2022 0503   GFRAA 101 02/17/2019 1543    INR    Component Value Date/Time   INR 1.2 10/22/2022 0542     Intake/Output Summary (Last 24 hours) at 10/28/2022 0758 Last data filed at  10/27/2022 2150 Gross per 24 hour  Intake 480 ml  Output 2975 ml  Net -2495 ml     Assessment/Plan:  57 y.o. male is s/p excision of infected PTFE bypass with redo left SFA to posterior tibial bypass with CryoVein  6 Days Post-Op   Left leg remains well perfused and warm with doppler Dp/PT signals Left leg incisions are intact and well appearing. Some oozing. Dry dressings applied He will now d/c on Po abx x 4 weeks per ID recommendations. Has follow up as outpatient with ID on 5/16 He will have follow up with our office in 2 weeks for staple removal and incision check Possible discharge home tomorrow   [facility] [phone] 10/28/2022 7:58 AM  I have seen and evaluated the patient. I agree with the PA note as documented above.  PICC line order canceled now that ID is recommending 4 weeks of oral antibiotics.  Dressings changed today and left leg incisions healing since re-do bypass and excision of PTFE bypass.  Brisk PT signal at the ankle.  Will give another day to mobilize and hopefully discharge tomorrow.  Overall looks good.  Cephus Shelling, MD [facility] [place] Office: [phone]

## 2022-10-29 ENCOUNTER — Other Ambulatory Visit (HOSPITAL_COMMUNITY): Payer: Self-pay

## 2022-10-29 LAB — CULTURE, BLOOD (ROUTINE X 2)
Culture: NO GROWTH
Special Requests: ADEQUATE

## 2022-10-29 LAB — GLUCOSE, CAPILLARY
Glucose-Capillary: 112 mg/dL — ABNORMAL HIGH (ref 70–99)
Glucose-Capillary: 118 mg/dL — ABNORMAL HIGH (ref 70–99)

## 2022-10-29 MED ORDER — OXYCODONE-ACETAMINOPHEN 5-325 MG PO TABS
1.0000 | ORAL_TABLET | Freq: Four times a day (QID) | ORAL | 0 refills | Status: DC | PRN
  Filled 2022-10-29: qty 30, 7d supply, fill #0

## 2022-10-29 NOTE — Discharge Instructions (Signed)
 [facility] [place]  Discharge instructions  Lower Extremity Bypass Surgery  Please refer to the following instruction for your post-procedure care. Your surgeon or physician assistant will discuss any changes with you.  Activity  You are encouraged to walk as much as you can. You can slowly return to normal activities during the month after your surgery. Avoid strenuous activity and heavy lifting until your doctor tells you it's OK. Avoid activities such as vacuuming or swinging a golf club. Do not drive until your doctor give the OK and you are no longer taking prescription pain medications. It is also normal to have difficulty with sleep habits, eating and bowel movement after surgery. These will go away with time.  Bathing/Showering  Shower daily after you go home. Do not soak in a bathtub, hot tub, or swim until the incision heals completely.  Incision Care  Clean your incision with mild soap and water. Shower every day. Pat the area dry with a clean towel. You do not need a bandage unless otherwise instructed. Do not apply any ointments or creams to your incision. If you have open wounds you will be instructed how to care for them or a visiting nurse may be arranged for you. If you have staples or sutures along your incision they will be removed at your post-op appointment. You may have skin glue on your incision. Do not peel it off. It will come off on its own in about one week.  Wash the groin wound with soap and water daily and pat dry. (No tub bath-only shower)  Then put a dry gauze or washcloth in the groin to keep this area dry to help prevent wound infection.  Do this daily and as needed.  Do not use Vaseline or neosporin on your incisions.  Only use soap and water on your incisions and then protect and keep dry.  Diet  Resume your normal diet. There are no special food restrictions following this procedure. A low fat/ low cholesterol diet is  recommended for all patients with vascular disease. In order to heal from your surgery, it is CRITICAL to get adequate nutrition. Your body requires vitamins, minerals, and protein. Vegetables are the best source of vitamins and minerals. Vegetables also provide the perfect balance of protein. Processed food has little nutritional value, so try to avoid this.  Medications  Resume taking all your medications unless your doctor or physician assistant tells you not to. If your incision is causing pain, you may take over-the-counter pain relievers such as acetaminophen (Tylenol). If you were prescribed a stronger pain medication, please aware these medication can cause nausea and constipation. Prevent nausea by taking the medication with a snack or meal. Avoid constipation by drinking plenty of fluids and eating foods with high amount of fiber, such as fruits, vegetables, and grains. Take Colace 100 mg (an over-the-counter stool softener) twice a day as needed for constipation.  Do not take Tylenol if you are taking prescription pain medications.  Follow Up  Our office will schedule a follow up appointment 2-3 weeks following discharge.  Please call us immediately for any of the following conditions  Severe or worsening pain in your legs or feet while at rest or while walking Increase pain, redness, warmth, or drainage (pus) from your incision site(s) Fever of 101 degree or higher The swelling in your leg with the bypass suddenly worsens and becomes more painful than when you were in the hospital If you have   been instructed to feel your graft pulse then you should do so every day. If you can no longer feel this pulse, call the office immediately. Not all patients are given this instruction.  Leg swelling is common after leg bypass surgery.  The swelling should improve over a few months following surgery. To improve the swelling, you may elevate your legs above the level of your heart while you are  sitting or resting. Your surgeon or physician assistant may ask you to apply an ACE wrap or wear compression (TED) stockings to help to reduce swelling.  Reduce your risk of vascular disease  Stop smoking. If you would like help call [facility] at 1-800-QUIT-NOW (1-800-784-8669) or [place] at 336-586-4000.  Manage your cholesterol Maintain a desired weight Control your diabetes weight Control your diabetes Keep your blood pressure down  If you have any questions, please call the office at [phone]  

## 2022-10-29 NOTE — Plan of Care (Signed)
  Problem: Education: Goal: Understanding of CV disease, CV risk reduction, and recovery process will improve Outcome: Adequate for Discharge Goal: Individualized Educational Video(s) Outcome: Adequate for Discharge   Problem: Activity: Goal: Ability to return to baseline activity level will improve Outcome: Adequate for Discharge   Problem: Cardiovascular: Goal: Ability to achieve and maintain adequate cardiovascular perfusion will improve Outcome: Adequate for Discharge Goal: Vascular access site(s) Level 0-1 will be maintained Outcome: Adequate for Discharge   Problem: Health Behavior/Discharge Planning: Goal: Ability to safely manage health-related needs after discharge will improve Outcome: Adequate for Discharge   Problem: Bowel/Gastric: Goal: Gastrointestinal status for postoperative course will improve Outcome: Adequate for Discharge   Problem: Activity: Goal: Ability to tolerate increased activity will improve Outcome: Adequate for Discharge   Problem: Fluid Volume: Goal: Ability to maintain a balanced intake and output will improve Outcome: Adequate for Discharge   Problem: Health Behavior/Discharge Planning: Goal: Ability to identify and utilize available resources and services will improve Outcome: Adequate for Discharge Goal: Ability to manage health-related needs will improve Outcome: Adequate for Discharge   Problem: Education: Goal: Ability to describe self-care measures that may prevent or decrease complications (Diabetes Survival Skills Education) will improve Outcome: Adequate for Discharge Goal: Individualized Educational Video(s) Outcome: Adequate for Discharge

## 2022-10-29 NOTE — TOC Transition Note (Signed)
Transition of Care (TOC) - CM/SW Discharge Note Donn Pierini RN, BSN Transitions of Care Unit 4E- RN Case Manager See Treatment Team for direct phone #   Patient Details  Name: Tony Hicks MRN: [id number] Date of Birth: 1965/10/11  Transition of Care [facility]) CM/SW Contact:  Darrold Span, RN Phone Number: 10/29/2022, 1:35 PM   Clinical Narrative:    Pt stable for transition home, noted pt mobilizing without assist devices per mobility note.  No TOC needs noted.   Pt to follow up as per AVS instructions. Has transportation home.    Final next level of care: Home/Self Care Barriers to Discharge: No Barriers Identified   Patient Goals and CMS Choice   Choice offered to / list presented to : NA  Discharge Placement                 Home        Discharge Plan and Services Additional resources added to the After Visit Summary for   In-house Referral: NA Discharge Planning Services: NA Post Acute Care Choice: NA          DME Arranged: N/A DME Agency: NA       HH Arranged: NA HH Agency: NA        Social Determinants of Health (SDOH) Interventions SDOH Screenings   Food Insecurity: No Food Insecurity (10/24/2022)  Housing: Low Risk  (10/24/2022)  Transportation Needs: No Transportation Needs (10/24/2022)  Utilities: Not At Risk (10/24/2022)  Alcohol Screen: Medium Risk (01/30/2021)  Depression (PHQ2-9): Low Risk  (09/19/2022)  Tobacco Use: High Risk (10/24/2022)     Readmission Risk Interventions    10/29/2022    1:35 PM  Readmission Risk Prevention Plan  Transportation Screening Complete  PCP or Specialist Appt within 5-7 Days Complete  Home Care Screening Complete  Medication Review (RN CM) Complete

## 2022-10-29 NOTE — Progress Notes (Addendum)
Progress Note    10/29/2022 7:56 AM 7 Days Post-Op  Subjective:  no major complaints   Vitals:   10/29/22 0544 10/29/22 0720  BP: 106/65 109/68  Pulse: 60 79  Resp: 14 18  Temp: 97.6 F (36.4 C) 97.6 F (36.4 C)  SpO2: 100% 98%   Physical Exam: Cardiac:  regular Lungs:  non labored Incisions: left leg incisions intact, staples and sutures in place Extremities:  2+ femoral, Doppler PT/ DP Neurologic: alert and oriented  CBC    Component Value Date/Time   WBC 9.6 10/23/2022 0503   RBC 2.84 (L) 10/23/2022 0503   HGB 9.2 (L) 10/23/2022 0503   HGB 14.7 09/19/2022 1209   HCT 25.5 (L) 10/23/2022 0503   HCT 42.1 09/19/2022 1209   PLT 261 10/23/2022 0503   PLT 231 09/19/2022 1209   MCV 89.8 10/23/2022 0503   MCV 92 09/19/2022 1209   MCH 32.4 10/23/2022 0503   MCHC 36.1 (H) 10/23/2022 0503   RDW 11.9 10/23/2022 0503   RDW 12.8 09/19/2022 1209   LYMPHSABS 1.1 10/22/2022 0542   LYMPHSABS 1.7 03/14/2021 1204   MONOABS 0.9 10/22/2022 0542   EOSABS 0.0 10/22/2022 0542   EOSABS 0.1 03/14/2021 1204   BASOSABS 0.1 10/22/2022 0542   BASOSABS 0.0 03/14/2021 1204    BMET    Component Value Date/Time   NA 131 (L) 10/23/2022 0503   NA 144 09/19/2022 1209   K 5.1 10/23/2022 0503   CL 97 (L) 10/23/2022 0503   CO2 23 10/23/2022 0503   GLUCOSE 305 (H) 10/23/2022 0503   BUN 13 10/23/2022 0503   BUN 11 09/19/2022 1209   CREATININE 0.83 10/23/2022 0503   CALCIUM 8.1 (L) 10/23/2022 0503   GFRNONAA >60 10/23/2022 0503   GFRAA 101 02/17/2019 1543    INR    Component Value Date/Time   INR 1.2 10/22/2022 0542     Intake/Output Summary (Last 24 hours) at 10/29/2022 0756 Last data filed at 10/29/2022 0500 Gross per 24 hour  Intake 250 ml  Output 2300 ml  Net -2050 ml     Assessment/Plan:  56 y.o. male is s/p excision of infected PTFE bypass with redo left SFA to posterior tibial bypass with CryoVein  7 Days Post-Op   Left leg remains well perfused and warm with  doppler Dp/PT signals Left leg incisions are intact and well appearing. No drainage He will now d/c on Po abx x 4 weeks per ID recommendations. Has follow up as outpatient with ID on 5/16 Continue to mobilize as able Discussed importance of continued smoking cessation He is stable for discharge home today  He will have follow up with our office in 2 weeks for staple removal and incision check   Dory Horn [facility] [phone] 10/29/2022 7:56 AM  I have seen and evaluated the patient. I agree with the PA note as documented above.  Patient has now undergone excision of his left SFA to PT PTFE bypass that was infected with replacement of bypass with CryoVein.  Continues to have a brisk PT signal at the ankle.  All of his drains have been removed.  He has no significant wound care issues and no significant drainage on exam.  I think he is safe for discharge.  ID has recommended 1 month of oral antibiotics with doxycycline and cefadroxil.  Discussed will arrange follow-up in 2 weeks in the office for incision checks and to get his staples and sutures out.  Plan discharge today.  Plan aspirin statin Plavix at discharge  Cephus Shelling, MD [facility]: [phone]

## 2022-10-30 ENCOUNTER — Other Ambulatory Visit: Payer: Self-pay

## 2022-10-30 ENCOUNTER — Telehealth: Payer: Self-pay

## 2022-10-30 NOTE — Transitions of Care (Post Inpatient/ED Visit) (Signed)
   10/30/2022  Name: Tony Hicks MRN: [id number] DOB: 1966/03/24  Today's TOC FU Call Status: Today's TOC FU Call Status:: Unsuccessul Call (1st Attempt) Unsuccessful Call (1st Attempt) Date: 10/30/22  Attempted to reach the patient regarding the most recent Inpatient/ED visit.  Follow Up Plan: Additional outreach attempts will be made to reach the patient to complete the Transitions of Care (Post Inpatient/ED visit) call.   Signature Karena Addison, LPN [facility] Nurse Health Advisor Direct Dial [phone]

## 2022-11-01 NOTE — Discharge Summary (Signed)
Bypass Discharge Summary Patient ID: DELWYN Hicks [id number] 57 y.o. 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 male with infected left superficial femoral to posterior tibial artery bypass.  This was placed about 2 weeks ago (10/08/2022). After careful discussion of risks, benefits, and alternatives the patient was offered excision of the bypass and replacement with cadaveric greater saphenous vein. We specifically discussed the risk of limb loss. The patient understood and wished to proceed.   Hospital Course: Tony Hicks was admitted on 10/22/22 and underwent 1) excision of prosthetic left superficial femoral to posterior tibial artery bypass 2) re-do left superficial femoral to posterior tibial artery bypass with cadaveric greater saphenous vein 3)  Placement of Penrose drains in previous saphenectomy tunnel by Dr. Lenell Antu and Dr. Chestine Spore. He tolerated the procedure well and was taken to the recovery room in stable condition  POD#1, left leg remained well perfused and warm with brisk PT signal. Left leg feeling a lot better. Penrose drains placed in old bypass tunnel for drainage. Continued on Vancomycin and Zosyn postoperatively. Intraoperative cultures pending. Blood cultures positive for strep. Echo ordered.   POD#2, continued  adequate perfusion of Left lower extremity. Penrose drains continued. Dressings to left leg changed. ID consulted. Echo negative for any vegetations. Blood cultures with group G strep. OR cultures growing staph and strep dysgalactiae. ID recommended transition from Vancomycin to Daptomycin and Zosyn to Unasyn.  Recommending 6 weeks course. Encouraged ambulation. PT/OT cleared patient.   The remainder of his hospital stay consisted of continued IV antibiotics. ID initially recommending PICC line however following final cultures recommended oral antibiotics x 4 weeks on discharge. Otherwise his left lower extremity remained well perfused. The penrose drain was removed and his incisions remained intact and well appearing. He continued to progress in his mobility and pain control.   By POD#6, he remained stable for discharge home. Left leg well perfused and warm with brisk DP/PT signals. Incisions well appearing and dry. 4 weeks of Doxycycline and Cefadroxil sent to transition of care pharmacy. He will continue on Aspirin, statin and Plavix as well as his home medications. PDMP reviewed an post operative pain medication sent to his pharmacy. Smoking cessation and plan discussed with patient. Follow up arranged for 2 weeks for staple removal and incision check.   Consults: ID  Treatments: antibiotics: vancomycin, Zosyn, ceftriaxone, and Unasyn, Cefadroxil, Doxycycline, analgesia: Oxycodone-Acetaminophen, anticoagulation: heparin, therapies: PT, OT, RN, and SW, and surgery: 1) excision of prosthetic left superficial femoral to posterior tibial artery bypass 2) re-do left superficial femoral to posterior tibial artery bypass with cadaveric greater saphenous vein 3.  Placement of Penrose drains in previous saphenectomy tunnel    Disposition: Discharge disposition: 01-Home or Self Care       - For [facility] use ---  Post-op:  Wound infection: No  Graft infection: No  Transfusion: No  If yes, 0 units  given New Arrhythmia: No Patency judged by: Arly.Keller ] Dopper only, [ ]   Palpable graft pulse, [ ]  Palpable distal pulse, [ ]  ABI inc. > 0.15, [ ]  Duplex D/C Ambulatory Status: Ambulatory with Assistance  Complications: MI: Arly.Keller ] No, [ ]  Troponin only, [ ]  EKG or Clinical CHF: No Resp failure: Arly.Keller ] none, [ ]  Pneumonia, [ ]  Ventilator Chg in renal function: [ X] none, [ ]  Inc. Cr > 0.5, [ ]  Temp. Dialysis, [ ]  Permanent dialysis Stroke: Arly.Keller ] None, [ ]  Minor, [ ]  Major Return to OR: No  Reason for return to OR: [ ]  Bleeding, [ ]  Infection, [ ]  Thrombosis, [ ]  Revision  Discharge medications: Statin use:  Yes ASA use:  Yes Plavix use:  Yes Beta blocker use: No  for medical reason not indicated Coumadin use: No  for medical reason not indicated    Patient Instructions:  Allergies as of 10/29/2022   No Known Allergies      Medication List     TAKE these medications    acetaminophen 325 MG tablet Commonly known as: TYLENOL Take 650 mg by mouth every 6 (six) hours as needed for mild pain or moderate pain.   aspirin EC 81 MG tablet Take 1 tablet (81 mg total) by mouth daily. Swallow whole.   atorvastatin 40 MG tablet Commonly known as: LIPITOR Take 1 tablet (40 mg total) by mouth daily.   cefadroxil 500 MG capsule Commonly known as: DURICEF Take 2 capsules (1,000 mg total) by mouth 2 (two) times daily for 23 days.   citalopram 20 MG tablet Commonly known as: CELEXA Take 1 tablet (20 mg total) by mouth daily.   clopidogrel 75 MG tablet Commonly known as: Plavix Take 1 tablet (75 mg total) by mouth daily.   doxycycline 100 MG tablet Commonly known as: VIBRA-TABS Take 1 tablet (100 mg total) by mouth 2 (two) times daily for 23 days.   FeroSul 325 (65 FE) MG tablet Generic drug: ferrous sulfate Take 1 tablet (325 mg total) by mouth daily with breakfast.   gabapentin 300 MG capsule Commonly known as: NEURONTIN Take 1 capsule (300 mg total) by mouth 2 (two) times daily.    metFORMIN 500 MG tablet Commonly known as: GLUCOPHAGE Take 1 tablet (500 mg total) by mouth daily with breakfast.   nicotine 21 mg/24hr patch Commonly known as: Nicoderm CQ Place 1 patch (21 mg total) onto the skin daily.   omega-3 acid ethyl esters 1 g capsule Commonly known as: LOVAZA Take 1 capsule (1 g total) by mouth 2 (two) times daily.   oxyCODONE-acetaminophen 5-325 MG tablet Commonly known as: Percocet Take 1 tablet by mouth every 6 (six) hours as needed for severe pain.   pantoprazole 40 MG tablet Commonly known as: PROTONIX Take 1 tablet (40 mg total) by mouth daily.               Discharge Care Instructions  (From admission, onward)           Start     Ordered   10/29/22 0000  Discharge wound care:       Comments: It is okay for you to shower. Wash incisions with mild soap and water, pat dry. Do not soak in bathtub. Do not apply any topical ointments, lotions, etc to your incisions. Okay to apply dry gauze to incisions if there is any oozing. Make sure to change dressings daily   10/29/22 0933           Activity: activity as tolerated, no driving while on  analgesics, and no heavy lifting for 4-6 weeks Diet: cardiac diet and low fat, low cholesterol diet Wound Care:  Okay to shower. Wash and wash incisions with mild soap and water, pat dry. Do not soak in bathtub. Do not apply any topical ointments etc  Follow-up with [facility] in  2-3  weeks.  Signed: Graceann Congress 11/01/2022 9:46 AM

## 2022-11-03 NOTE — Transitions of Care (Post Inpatient/ED Visit) (Signed)
   11/03/2022  Name: Tony Hicks MRN: [id number] DOB: Jul 10, 1965  Today's TOC FU Call Status: Today's TOC FU Call Status:: Unsuccessful Call (2nd Attempt) Unsuccessful Call (1st Attempt) Date: 10/30/22 Unsuccessful Call (2nd Attempt) Date: 11/03/22  Attempted to reach the patient regarding the most recent Inpatient/ED visit.  Follow Up Plan: No further outreach attempts will be made at this time. We have been unable to contact the patient.  Signature Karena Addison, LPN [facility] Nurse Health Advisor Direct Dial [phone]

## 2022-11-08 ENCOUNTER — Other Ambulatory Visit: Payer: Self-pay | Admitting: Physician Assistant

## 2022-11-10 ENCOUNTER — Other Ambulatory Visit: Payer: Self-pay | Admitting: Nurse Practitioner

## 2022-11-10 ENCOUNTER — Other Ambulatory Visit: Payer: Self-pay

## 2022-11-10 ENCOUNTER — Other Ambulatory Visit (HOSPITAL_COMMUNITY): Payer: Self-pay

## 2022-11-10 ENCOUNTER — Other Ambulatory Visit: Payer: Self-pay | Admitting: Internal Medicine

## 2022-11-10 MED ORDER — DOXYCYCLINE HYCLATE 100 MG PO TABS
100.0000 mg | ORAL_TABLET | Freq: Two times a day (BID) | ORAL | 0 refills | Status: DC
  Filled 2022-11-10 – 2022-11-23 (×4): qty 46, 23d supply, fill #0

## 2022-11-10 MED ORDER — NICOTINE 21 MG/24HR TD PT24
21.0000 mg | MEDICATED_PATCH | Freq: Every day | TRANSDERMAL | 0 refills | Status: DC
  Filled 2022-11-10 – 2022-11-23 (×3): qty 28, 28d supply, fill #0

## 2022-11-10 MED ORDER — ACETAMINOPHEN 325 MG PO TABS
650.0000 mg | ORAL_TABLET | Freq: Four times a day (QID) | ORAL | 2 refills | Status: DC | PRN
  Filled 2022-11-10 – 2022-12-24 (×6): qty 90, 12d supply, fill #0
  Filled 2022-12-30 – 2023-01-04 (×2): qty 90, 12d supply, fill #1
  Filled 2023-02-20: qty 90, 12d supply, fill #2

## 2022-11-10 NOTE — Telephone Encounter (Signed)
Caller & Relationship to patient:  MRN #  [id number]   Call Back Number:   Date of Last Office Visit: Visit date not found     Date of Next Office Visit: 11/20/2022    Medication(s) to be Refilled: Oxycodone   Preferred Pharmacy:   ** Please notify patient to allow 48-72 hours to process** **Let patient know to contact pharmacy at the end of the day to make sure medication is ready. ** **If patient has not been seen in a year or longer, book an appointment **Advise to use MyChart for refill requests OR to contact their pharmacy

## 2022-11-10 NOTE — Telephone Encounter (Signed)
Please advise KH 

## 2022-11-10 NOTE — Telephone Encounter (Signed)
From: [name] To: Office of [name], NP Sent: 11/10/2022 2:48 PM EDT Subject: Medication Renewal Request  Refills have been requested for the following medications:   acetaminophen (TYLENOL) 325 MG tablet  Preferred pharmacy: [facility] Delivery method: Daryll Drown

## 2022-11-11 ENCOUNTER — Other Ambulatory Visit (HOSPITAL_COMMUNITY): Payer: Self-pay

## 2022-11-11 ENCOUNTER — Ambulatory Visit (INDEPENDENT_AMBULATORY_CARE_PROVIDER_SITE_OTHER): Payer: Medicaid Other | Admitting: Physician Assistant

## 2022-11-11 ENCOUNTER — Other Ambulatory Visit: Payer: Self-pay

## 2022-11-11 VITALS — BP 116/79 | HR 93 | Temp 97.8°F

## 2022-11-11 DIAGNOSIS — I739 Peripheral vascular disease, unspecified: Secondary | ICD-10-CM

## 2022-11-11 DIAGNOSIS — I70322 Atherosclerosis of unspecified type of bypass graft(s) of the extremities with rest pain, left leg: Secondary | ICD-10-CM

## 2022-11-11 MED ORDER — OXYCODONE-ACETAMINOPHEN 5-325 MG PO TABS
1.0000 | ORAL_TABLET | Freq: Four times a day (QID) | ORAL | 0 refills | Status: DC | PRN
  Filled 2022-11-11: qty 20, 5d supply, fill #0

## 2022-11-11 NOTE — Progress Notes (Signed)
POST OPERATIVE OFFICE NOTE    CC:  F/u for surgery  HPI:  This is a 57 y.o. male who is s/p redo left lower extremity SFA to posterior tibial bypass with PTFE by Dr. Lenell Antu on 10/08/2022.  He developed an infected bypass graft and underwent excision of infected bypass with redo left SFA to PT bypass using cadaveric CryoVein by Dr. Lenell Antu on 10/22/2022.  Bypass was performed due to left great toe and heel wounds.  ID was also involved and he continues to be on 6 weeks of antibiotics.  His main complaints are of left lower extremity edema and ongoing pain.  He denies any drainage from his incisions.  He is on aspirin, plavix and statin daily  No Known Allergies  Current Outpatient Medications  Medication Sig Dispense Refill   acetaminophen (TYLENOL) 325 MG tablet Take 2 tablets (650 mg total) by mouth every 6 (six) hours as needed for mild pain or moderate pain. 90 tablet 2   aspirin EC 81 MG tablet Take 1 tablet (81 mg total) by mouth daily. Swallow whole. 30 tablet 11   atorvastatin (LIPITOR) 40 MG tablet Take 1 tablet (40 mg total) by mouth daily. 90 tablet 1   cefadroxil (DURICEF) 500 MG capsule Take 2 capsules (1,000 mg total) by mouth 2 (two) times daily for 23 days. 92 capsule 0   citalopram (CELEXA) 20 MG tablet Take 1 tablet (20 mg total) by mouth daily. 30 tablet 3   clopidogrel (PLAVIX) 75 MG tablet Take 1 tablet (75 mg total) by mouth daily. 30 tablet 11   doxycycline (VIBRA-TABS) 100 MG tablet Take 1 tablet (100 mg total) by mouth 2 (two) times daily for 23 days. 46 tablet 0   ferrous sulfate 325 (65 FE) MG tablet Take 1 tablet (325 mg total) by mouth daily with breakfast. 120 tablet 0   gabapentin (NEURONTIN) 300 MG capsule Take 1 capsule (300 mg total) by mouth 2 (two) times daily. 180 capsule 1   metFORMIN (GLUCOPHAGE) 500 MG tablet Take 1 tablet (500 mg total) by mouth daily with breakfast. 90 tablet 1   nicotine (NICODERM CQ) 21 mg/24hr patch Place 1 patch (21 mg total) onto the  skin daily. 28 patch 0   omega-3 acid ethyl esters (LOVAZA) 1 g capsule Take 1 capsule (1 g total) by mouth 2 (two) times daily. 60 capsule 2   pantoprazole (PROTONIX) 40 MG tablet Take 1 tablet (40 mg total) by mouth daily. 90 tablet 1   oxyCODONE-acetaminophen (PERCOCET) 5-325 MG tablet Take 1 tablet by mouth every 6 (six) hours as needed for severe pain. 20 tablet 0   No current facility-administered medications for this visit.     ROS:  See HPI  Physical Exam:  Vitals:   11/11/22 0855  BP: 116/79  Pulse: 93  Temp: 97.8 F (36.6 C)  TempSrc: Temporal  SpO2: 98%    Incision: Left SFA and distal thigh incision well-healed; left ankle incision also well-appearing Extremities:   Brisk left PT and DP signals by Doppler; left lower extremity with pitting edema and warm to touch; dry left great toe ulcer; dry left heel ulcer  Assessment/Plan:  This is a 57 y.o. male who is s/p: Redo left SFA to PT bypass with PTFE; bypass became infected and patient underwent excision of PTFE bypass with redo SFA to PT bypass using cadaveric CryoVein  -Left foot well-perfused with brisk PT and DP signal by Doppler.  All incisions appear to be healing  well.  Staples were removed from SFA and knee level incisions.  Every other staple was removed from the left ankle incision.  Encourage patient to continue to elevate the leg when possible during the day to help with edema.  He will return in 1 to 2 weeks for removal of the remainder of staples and sutures from the left ankle incision.  He will continue his antibiotics under the recommendations of infectious disease.  Dr. Chestine Spore was also involved in the evaluation management plan of this patient today.   Emilie Rutter, PA-C [facility] [phone]

## 2022-11-12 ENCOUNTER — Other Ambulatory Visit: Payer: Self-pay

## 2022-11-12 ENCOUNTER — Other Ambulatory Visit (HOSPITAL_COMMUNITY): Payer: Self-pay

## 2022-11-13 ENCOUNTER — Other Ambulatory Visit: Payer: Self-pay | Admitting: Internal Medicine

## 2022-11-13 ENCOUNTER — Other Ambulatory Visit (HOSPITAL_COMMUNITY): Payer: Self-pay

## 2022-11-14 ENCOUNTER — Other Ambulatory Visit: Payer: Self-pay

## 2022-11-14 ENCOUNTER — Other Ambulatory Visit (HOSPITAL_COMMUNITY): Payer: Self-pay

## 2022-11-15 ENCOUNTER — Other Ambulatory Visit: Payer: Self-pay | Admitting: Physician Assistant

## 2022-11-17 ENCOUNTER — Telehealth: Payer: Self-pay | Admitting: Nurse Practitioner

## 2022-11-17 ENCOUNTER — Other Ambulatory Visit (HOSPITAL_COMMUNITY): Payer: Self-pay

## 2022-11-17 ENCOUNTER — Other Ambulatory Visit: Payer: Self-pay

## 2022-11-17 ENCOUNTER — Other Ambulatory Visit: Payer: Self-pay | Admitting: Internal Medicine

## 2022-11-17 NOTE — Progress Notes (Unsigned)
POST OPERATIVE OFFICE NOTE    CC:  F/u for surgery  HPI:  This is a 57 y.o. male who is s/p  redo left lower extremity SFA to posterior tibial bypass with PTFE by Dr. Lenell Antu on 10/08/2022.  He developed an infected bypass graft and underwent excision of infected bypass with redo left SFA to PT bypass using cadaveric CryoVein by Dr. Lenell Antu on 10/22/2022.  Bypass was performed due to left great toe and heel wounds. ID was also involved and he continues to be on 6 weeks of antibiotics.   Pt was seen on 11/11/2022 and at that time he had LLE edema and ongoing pain.  He was not having any drainage from his incision.  Staples were removed from SFA and knee level incisions. Every other staple was removed from the left ankle incision.  He was encouraged on leg elevation and return in 1-2 weeks for removal of remainder of staples. He was on his IV abx per ID.   Pt returns today for follow up.  Pt states ***   No Known Allergies  Current Outpatient Medications  Medication Sig Dispense Refill   acetaminophen (TYLENOL) 325 MG tablet Take 2 tablets (650 mg total) by mouth every 6 (six) hours as needed for mild pain or moderate pain. 90 tablet 2   aspirin EC 81 MG tablet Take 1 tablet (81 mg total) by mouth daily. Swallow whole. 30 tablet 11   atorvastatin (LIPITOR) 40 MG tablet Take 1 tablet (40 mg total) by mouth daily. 90 tablet 1   cefadroxil (DURICEF) 500 MG capsule Take 2 capsules (1,000 mg total) by mouth 2 (two) times daily for 23 days. 92 capsule 0   citalopram (CELEXA) 20 MG tablet Take 1 tablet (20 mg total) by mouth daily. 30 tablet 3   clopidogrel (PLAVIX) 75 MG tablet Take 1 tablet (75 mg total) by mouth daily. 30 tablet 11   doxycycline (VIBRA-TABS) 100 MG tablet Take 1 tablet (100 mg total) by mouth 2 (two) times daily for 23 days. 46 tablet 0   ferrous sulfate 325 (65 FE) MG tablet Take 1 tablet (325 mg total) by mouth daily with breakfast. 120 tablet 0   gabapentin (NEURONTIN) 300 MG  capsule Take 1 capsule (300 mg total) by mouth 2 (two) times daily. 180 capsule 1   metFORMIN (GLUCOPHAGE) 500 MG tablet Take 1 tablet (500 mg total) by mouth daily with breakfast. 90 tablet 1   nicotine (NICODERM CQ) 21 mg/24hr patch Place 1 patch (21 mg total) onto the skin daily. 28 patch 0   omega-3 acid ethyl esters (LOVAZA) 1 g capsule Take 1 capsule (1 g total) by mouth 2 (two) times daily. 60 capsule 2   oxyCODONE-acetaminophen (PERCOCET) 5-325 MG tablet Take 1 tablet by mouth every 6 (six) hours as needed for severe pain. 20 tablet 0   pantoprazole (PROTONIX) 40 MG tablet Take 1 tablet (40 mg total) by mouth daily. 90 tablet 1   No current facility-administered medications for this visit.     ROS:  See HPI  Physical Exam:  ***  Incision:  *** Extremities:  *** Neuro: *** Abdomen:  ***    Assessment/Plan:  This is a 57 y.o. male who is s/p: redo left lower extremity SFA to posterior tibial bypass with PTFE by Dr. Lenell Antu on 10/08/2022.  He developed an infected bypass graft and underwent excision of infected bypass with redo left SFA to PT bypass using cadaveric CryoVein by Dr. Lenell Antu on  10/22/2022.  Bypass was performed due to left great toe and heel wounds.  -***   Doreatha Massed, [facility] [phone]   Clinic MD:  Lenell Antu

## 2022-11-17 NOTE — Telephone Encounter (Signed)
Appt 5/16

## 2022-11-17 NOTE — Telephone Encounter (Signed)
Pt requesting prescription for oxycodone sent to pharmacy. Pt said he is in a lot of pain but the pharmacy needs verification before they will fill his medication.

## 2022-11-18 ENCOUNTER — Telehealth: Payer: Self-pay

## 2022-11-18 ENCOUNTER — Other Ambulatory Visit: Payer: Self-pay

## 2022-11-18 ENCOUNTER — Other Ambulatory Visit: Payer: Self-pay | Admitting: Physician Assistant

## 2022-11-18 ENCOUNTER — Ambulatory Visit (INDEPENDENT_AMBULATORY_CARE_PROVIDER_SITE_OTHER): Payer: Medicaid Other | Admitting: Physician Assistant

## 2022-11-18 VITALS — BP 133/87 | HR 86 | Temp 98.0°F | Resp 20 | Ht 66.0 in | Wt 197.1 lb

## 2022-11-18 DIAGNOSIS — I70322 Atherosclerosis of unspecified type of bypass graft(s) of the extremities with rest pain, left leg: Secondary | ICD-10-CM

## 2022-11-18 DIAGNOSIS — I739 Peripheral vascular disease, unspecified: Secondary | ICD-10-CM

## 2022-11-18 MED ORDER — OXYCODONE-ACETAMINOPHEN 5-325 MG PO TABS
1.0000 | ORAL_TABLET | Freq: Four times a day (QID) | ORAL | 0 refills | Status: DC | PRN
  Filled 2022-11-18: qty 20, 5d supply, fill #0

## 2022-11-18 NOTE — Telephone Encounter (Signed)
[employer] uses a 3rd party to do their authorizations ([employer]) and [employer].  Per Rep, she put a ticket in to update [facility] phone and fax number in their system.  In the meantime, we cannot submit an authorization for surgery, per HIPAA.  I called patient to make him aware of the delay and that we will authorize his procedure as soon as we can.  We may have to reschedule his surgery until then.  Patient is aware and verbalized understanding.

## 2022-11-18 NOTE — Telephone Encounter (Signed)
Pt was advised to call the pharmacy to have them to request the medication from the prescriber. KH

## 2022-11-18 NOTE — Telephone Encounter (Signed)
Per office practice administrator Roxy Horseman, patient needs to proceed with aortogram as scheduled on 5/16 due to increase risk of limb loss.   Spoke with patient to advise procedure will proceed as scheduled. Reviewed instructions and patient verbalized understanding.

## 2022-11-18 NOTE — Telephone Encounter (Signed)
Doxy was refilled 5/6, patient had to cancel his appointment this week due to scheduled procedure. Do you want to extend the cefadroxil?

## 2022-11-20 ENCOUNTER — Inpatient Hospital Stay: Payer: Medicaid Other | Admitting: Internal Medicine

## 2022-11-20 ENCOUNTER — Ambulatory Visit (HOSPITAL_COMMUNITY)
Admission: RE | Admit: 2022-11-20 | Discharge: 2022-11-20 | Disposition: A | Discharge status: Home / Self Care | Payer: No Typology Code available for payment source | Attending: Vascular Surgery | Admitting: Vascular Surgery

## 2022-11-20 ENCOUNTER — Other Ambulatory Visit (HOSPITAL_COMMUNITY): Payer: Self-pay

## 2022-11-20 ENCOUNTER — Other Ambulatory Visit: Payer: Self-pay

## 2022-11-20 ENCOUNTER — Encounter (HOSPITAL_COMMUNITY)
Admission: RE | Disposition: A | Discharge status: Home / Self Care | Payer: Self-pay | Source: Home / Self Care | Attending: Vascular Surgery

## 2022-11-20 DIAGNOSIS — I70522 Atherosclerosis of nonautologous biological bypass graft(s) of the extremities with rest pain, left leg: Secondary | ICD-10-CM | POA: Diagnosis not present

## 2022-11-20 DIAGNOSIS — I739 Peripheral vascular disease, unspecified: Secondary | ICD-10-CM

## 2022-11-20 DIAGNOSIS — I70245 Atherosclerosis of native arteries of left leg with ulceration of other part of foot: Secondary | ICD-10-CM

## 2022-11-20 DIAGNOSIS — I70322 Atherosclerosis of unspecified type of bypass graft(s) of the extremities with rest pain, left leg: Secondary | ICD-10-CM

## 2022-11-20 DIAGNOSIS — F17210 Nicotine dependence, cigarettes, uncomplicated: Secondary | ICD-10-CM | POA: Diagnosis not present

## 2022-11-20 DIAGNOSIS — I70222 Atherosclerosis of native arteries of extremities with rest pain, left leg: Secondary | ICD-10-CM | POA: Diagnosis not present

## 2022-11-20 HISTORY — PX: PERIPHERAL VASCULAR INTERVENTION: CATH118257

## 2022-11-20 HISTORY — PX: LOWER EXTREMITY ANGIOGRAPHY: CATH118251

## 2022-11-20 LAB — POCT I-STAT, CHEM 8
BUN: 11 mg/dL (ref 6–20)
Calcium, Ion: 1.21 mmol/L (ref 1.15–1.40)
Chloride: 103 mmol/L (ref 98–111)
Creatinine, Ser: 0.5 mg/dL — ABNORMAL LOW (ref 0.61–1.24)
Glucose, Bld: 111 mg/dL — ABNORMAL HIGH (ref 70–99)
HCT: 37 % — ABNORMAL LOW (ref 39.0–52.0)
Hemoglobin: 12.6 g/dL — ABNORMAL LOW (ref 13.0–17.0)
Potassium: 3.9 mmol/L (ref 3.5–5.1)
Sodium: 137 mmol/L (ref 135–145)
TCO2: 23 mmol/L (ref 22–32)

## 2022-11-20 LAB — GLUCOSE, CAPILLARY
Glucose-Capillary: 115 mg/dL — ABNORMAL HIGH (ref 70–99)
Glucose-Capillary: 91 mg/dL (ref 70–99)

## 2022-11-20 LAB — POCT ACTIVATED CLOTTING TIME: Activated Clotting Time: 250 seconds

## 2022-11-20 SURGERY — LOWER EXTREMITY ANGIOGRAPHY
Anesthesia: LOCAL

## 2022-11-20 MED ORDER — MIDAZOLAM HCL 2 MG/2ML IJ SOLN
INTRAMUSCULAR | Status: AC
  Filled 2022-11-20: qty 2

## 2022-11-20 MED ORDER — SODIUM CHLORIDE 0.9 % IV SOLN: 250.0000 mL | INTRAVENOUS | Status: DC | PRN

## 2022-11-20 MED ORDER — ONDANSETRON HCL 4 MG/2ML IJ SOLN: 4.0000 mg | Freq: Four times a day (QID) | INTRAMUSCULAR | Status: DC | PRN

## 2022-11-20 MED ORDER — HEPARIN (PORCINE) IN NACL 1000-0.9 UT/500ML-% IV SOLN
INTRAVENOUS | Status: DC | PRN
  Administered 2022-11-20 (×4): 500 mL

## 2022-11-20 MED ORDER — MIDAZOLAM HCL 2 MG/2ML IJ SOLN
INTRAMUSCULAR | Status: DC | PRN
  Administered 2022-11-20 (×3): 1 mg via INTRAVENOUS

## 2022-11-20 MED ORDER — OXYCODONE-ACETAMINOPHEN 5-325 MG PO TABS
1.0000 | ORAL_TABLET | Freq: Four times a day (QID) | ORAL | 0 refills | Status: DC | PRN
  Filled 2022-11-20 (×2): qty 20, 5d supply, fill #0

## 2022-11-20 MED ORDER — SODIUM CHLORIDE 0.9 % IV SOLN: INTRAVENOUS | Status: DC

## 2022-11-20 MED ORDER — LIDOCAINE HCL (PF) 1 % IJ SOLN
INTRAMUSCULAR | Status: DC | PRN
  Administered 2022-11-20 (×2): 10 mL

## 2022-11-20 MED ORDER — LIDOCAINE HCL (PF) 1 % IJ SOLN
INTRAMUSCULAR | Status: AC
  Filled 2022-11-20: qty 30

## 2022-11-20 MED ORDER — HEPARIN SODIUM (PORCINE) 1000 UNIT/ML IJ SOLN
INTRAMUSCULAR | Status: DC | PRN
  Administered 2022-11-20: 9000 [IU] via INTRAVENOUS

## 2022-11-20 MED ORDER — LABETALOL HCL 5 MG/ML IV SOLN: 10.0000 mg | INTRAVENOUS | Status: DC | PRN

## 2022-11-20 MED ORDER — SODIUM CHLORIDE 0.9% FLUSH: 3.0000 mL | Freq: Two times a day (BID) | INTRAVENOUS | Status: DC

## 2022-11-20 MED ORDER — IODIXANOL 320 MG/ML IV SOLN
INTRAVENOUS | Status: DC | PRN
  Administered 2022-11-20: 125 mL

## 2022-11-20 MED ORDER — FENTANYL CITRATE (PF) 100 MCG/2ML IJ SOLN
INTRAMUSCULAR | Status: AC
  Filled 2022-11-20: qty 2

## 2022-11-20 MED ORDER — ACETAMINOPHEN 325 MG PO TABS: 650.0000 mg | ORAL_TABLET | ORAL | Status: DC | PRN

## 2022-11-20 MED ORDER — SODIUM CHLORIDE 0.9% FLUSH: 3.0000 mL | INTRAVENOUS | Status: DC | PRN

## 2022-11-20 MED ORDER — HEPARIN SODIUM (PORCINE) 1000 UNIT/ML IJ SOLN
INTRAMUSCULAR | Status: AC
  Filled 2022-11-20: qty 10

## 2022-11-20 MED ORDER — FENTANYL CITRATE (PF) 100 MCG/2ML IJ SOLN
INTRAMUSCULAR | Status: DC | PRN
  Administered 2022-11-20 (×5): 25 ug via INTRAVENOUS

## 2022-11-20 MED ORDER — HYDRALAZINE HCL 20 MG/ML IJ SOLN: 5.0000 mg | INTRAMUSCULAR | Status: DC | PRN

## 2022-11-20 SURGICAL SUPPLY — 33 items
BALLN MUSTANG 5X120X135 (BALLOONS) ×2
BALLN STERLING OTW 2.5X150X150 (BALLOONS) ×2
BALLN STERLING OTW 3X100X150 (BALLOONS) ×2
BALLN STERLING OTW 4X150X150 (BALLOONS) ×2
BALLOON MUSTANG 5X120X135 (BALLOONS) ×1 IMPLANT
BALLOON STERLING OTW 3X100X150 (BALLOONS) ×1 IMPLANT
BALLOON STERLING OTW 4X150X150 (BALLOONS) ×1 IMPLANT
BALLOON STRLNG OTW 2.5X150X150 (BALLOONS) ×1 IMPLANT
CATH CXI 2.6F 65 ANG (CATHETERS) ×2
CATH CXI 2.6F 65 ST (CATHETERS) ×2
CATH OMNI FLUSH 5F 65CM (CATHETERS) ×1 IMPLANT
CATH QUICKCROSS .035X135CM (MICROCATHETER) ×1 IMPLANT
CATH SPRT ANG 65X2.3FR PLATN (CATHETERS) ×1 IMPLANT
CATH SPRT STRG 65X2.6FR ACPT (CATHETERS) ×1 IMPLANT
DEVICE CLOSURE MYNXGRIP 6/7F (Vascular Products) ×1 IMPLANT
DEVICE ONE SNARE 10MM (MISCELLANEOUS) ×1 IMPLANT
GLIDEWIRE ADV .035X260CM (WIRE) ×1 IMPLANT
KIT ENCORE 26 ADVANTAGE (KITS) ×1 IMPLANT
KIT MICROPUNCTURE NIT STIFF (SHEATH) ×2 IMPLANT
KIT PV (KITS) ×2 IMPLANT
SHEATH CATAPULT 6FR 45 (SHEATH) ×1 IMPLANT
SHEATH MICROPUNCTURE PEDAL 4FR (SHEATH) ×1 IMPLANT
SHEATH PINNACLE 5F 10CM (SHEATH) ×1 IMPLANT
SHEATH PINNACLE 6F 10CM (SHEATH) ×1 IMPLANT
SHEATH PROBE COVER 6X72 (BAG) ×1 IMPLANT
STENT ELUVIA 6X150X130 (Permanent Stent) ×1 IMPLANT
STOPCOCK MORSE 400PSI 3WAY (MISCELLANEOUS) ×1 IMPLANT
SYR MEDRAD MARK 7 150ML (SYRINGE) ×2 IMPLANT
TRANSDUCER W/STOPCOCK (MISCELLANEOUS) ×2 IMPLANT
TRAY PV CATH (CUSTOM PROCEDURE TRAY) ×2 IMPLANT
TUBING CIL FLEX 10 FLL-RA (TUBING) ×1 IMPLANT
WIRE G V18X300CM (WIRE) ×3 IMPLANT
WIRE STARTER BENTSON 035X150 (WIRE) ×1 IMPLANT

## 2022-11-20 NOTE — H&P (Signed)
History and Physical Interval Note:  11/20/2022 12:23 PM  Tony Hicks  has presented today for surgery, with the diagnosis of accluded bypass with rest pain.  The various methods of treatment have been discussed with the patient and family. After consideration of risks, benefits and other options for treatment, the patient has consented to  Procedure(s): ABDOMINAL AORTOGRAM W/LOWER EXTREMITY (N/A) as a surgical intervention.  The patient's history has been reviewed, patient examined, no change in status, stable for surgery.  I have reviewed the patient's chart and labs.  Questions were answered to the patient's satisfaction.    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  Most recent graft bypass now occluded.  Discussed limited options moving forward.  High risk for limb loss.  Tony Hicks   POST OPERATIVE OFFICE NOTE       CC:  F/u for surgery   HPI:  This is a 57 y.o. male who is s/p  redo left lower extremity SFA to posterior tibial bypass with PTFE by Dr. Lenell Antu on 10/08/2022.  He developed an infected bypass graft and underwent excision of infected bypass with redo left SFA to PT bypass using cadaveric CryoVein by Dr. Chestine Spore on 10/22/2022.  Bypass was performed due to left great toe and heel wounds. ID was also involved and he continues to be on 6 weeks of antibiotics.    Pt was seen on 11/11/2022 and at that time he had LLE edema and ongoing pain.  He was not having any drainage from his incision.  Staples were removed from SFA and knee level incisions. Every other staple was removed from the left ankle incision.  He was encouraged on leg elevation and return in 1-2 weeks for removal of remainder of staples. He was on his IV abx per ID.    Pt returns today for follow up.  Pt states he states that he is having left foot pain.  He states it started a couple of days ago and he also has numbness.  He states that he really can't tolerate this pain the rest of his life.  He is still  smoking cigarettes.       No Known Allergies         Current Outpatient Medications  Medication Sig Dispense Refill   acetaminophen (TYLENOL) 325 MG tablet Take 2 tablets (650 mg total) by mouth every 6 (six) hours as needed for mild pain or moderate pain. 90 tablet 2   aspirin EC 81 MG tablet Take 1 tablet (81 mg total) by mouth daily. Swallow whole. 30 tablet 11   atorvastatin (LIPITOR) 40 MG tablet Take 1 tablet (40 mg total) by mouth daily. 90 tablet 1   cefadroxil (DURICEF) 500 MG capsule Take 2 capsules (1,000 mg total) by mouth 2 (two) times daily for 23 days. 92 capsule 0   citalopram (CELEXA) 20 MG tablet Take 1 tablet (20 mg total) by mouth daily. 30 tablet 3   clopidogrel (PLAVIX) 75 MG tablet Take 1 tablet (75 mg total) by mouth daily. 30 tablet 11   doxycycline (VIBRA-TABS) 100 MG tablet Take 1 tablet (100 mg total) by mouth 2 (two) times daily for 23 days. 46 tablet 0   ferrous sulfate 325 (65 FE) MG tablet Take 1 tablet (325 mg total) by mouth daily with breakfast. 120 tablet 0   gabapentin (NEURONTIN) 300 MG capsule Take 1 capsule (300 mg total) by mouth 2 (two) times daily. 180 capsule 1   metFORMIN (  GLUCOPHAGE) 500 MG tablet Take 1 tablet (500 mg total) by mouth daily with breakfast. 90 tablet 1   nicotine (NICODERM CQ) 21 mg/24hr patch Place 1 patch (21 mg total) onto the skin daily. 28 patch 0   omega-3 acid ethyl esters (LOVAZA) 1 g capsule Take 1 capsule (1 g total) by mouth 2 (two) times daily. 60 capsule 2   oxyCODONE-acetaminophen (PERCOCET) 5-325 MG tablet Take 1 tablet by mouth every 6 (six) hours as needed for severe pain. 20 tablet 0   pantoprazole (PROTONIX) 40 MG tablet Take 1 tablet (40 mg total) by mouth daily. 90 tablet 1    No current facility-administered medications for this visit.       ROS:  See HPI   Physical Exam:      Today's Vitals    11/18/22 0856  BP: 133/87  Pulse: 86  Resp: 20  Temp: 98 F (36.7 C)  TempSrc: Temporal  SpO2: 96%   Weight: 197 lb 1.6 oz (89.4 kg)  Height: 5\' 6"  (1.676 m)  PainSc: 9     Body mass index is 31.81 kg/m.     Incision:  all incisions have healed.  Extremities:  unable to obtain doppler flow left foot. + swelling LLE         Assessment/Plan:  This is a 57 y.o. male who is s/p: redo left lower extremity SFA to posterior tibial bypass with PTFE by Dr. Lenell Antu on 10/08/2022.  He developed an infected bypass graft and underwent excision of infected bypass with redo left SFA to PT bypass using cadaveric CryoVein by Dr. Lenell Antu on 10/22/2022.  Bypass was performed due to left great toe and heel wounds.   -pt seen with Dr. Chestine Spore and pt without doppler flow left foot.  Dr. Chestine Spore had discussion with pt that he is running out of options and is not a candidate for any further bypass options.  He discussed that he could do angiogram and possibly balloon/stent left leg.  Pt is in agreement and will plan for this on Thursday, 11/20/2022.  Pt knows he is at high risk for amputation.   -continue asa/statin/plavix     Doreatha Massed, [facility] [phone]

## 2022-11-20 NOTE — Op Note (Signed)
Patient name: LAVERN LANGLITZ MRN: [id number] DOB: Mar 20, 1966 Sex: male  11/20/2022 Pre-operative Diagnosis: Critical limb ischemia of the left lower extremity with tissue loss with occluded SFA to posterior tibial artery CryoVein bypass Post-operative diagnosis:  Same Surgeon:  Cephus Shelling, MD Assistant:  Ladonna Snide, MD Procedure Performed: 1.  Ultrasound-guided access right common femoral artery 2.  Left lower extremity arteriogram with catheter selection of the left common femoral, SFA, popliteal artery from an antegrade approach 3.  Ultrasound-guided access left dorsalis pedis artery retrograde at the ankle 4.  Left anterior tibial angioplasty (2.5 mm and 3 mm Sterling) 5.  Angioplasty and stent of the left SFA above-knee popliteal artery (6 mm x 150 mm Eluvia postdilated with a 5 mm Sterling) 6.  Angioplasty of the left above and below-knee popliteal artery including behind the knee (3 mm and 4 mm Sterling) 7.  Mynx closure of the right common femoral artery 8.  124 minutes monitored moderate conscious sedation time  Indications: 57 year old male initially underwent a left above-knee popliteal artery to posterior tibial bypass with vein on 01/14/2021 for critical limb ischemia with tissue loss.  Ultimately his vein graft occluded and he underwent a redo bypass with PTFE.  This got infected and most recently was excised with replacement of CryoVein on 10/08/2022.  He came back to the office on Tuesday and his bypass was occluded.  He now presents for attempted endovascular revascularization of his left lower extremity knowing he is high risk for limb loss.  Findings:   Ultrasound-guided access right common femoral artery.  Aortogram was deferred.  Left lower extremity arteriogram showed a patent common femoral as well as the profunda and widely patent proximal to mid SFA.  Distally the SFA had multiple segments of moderate to high-grade disease and the the above-knee popliteal  artery and below-knee popliteal artery was occluded.  The trifurcation was occluded.  Just above the ankle he reconstitutes a very diminutive anterior tibial and posterior tibial.  Left leg bypass occluded.  Ultimately I accessed the dorsalis pedis retrograde in the left foot and I was able get my wire and catheter retrograde into the below-knee popliteal artery.  I could not get retrograde through the popliteal artery due to heavily calcified chronic total occlusion.  I then tried to come antegrade and put a 6 French sheath over the aortic bifurcation into the left SFA.  I then came antegrade and again encountered the popliteal chronic total occlusion that was heavily calcified.  After working including with my partner Dr. Karin Lieu who came to assist we finally were able to cross the popliteal occlusion and snare our retrograde wire and we had through and through access.  The entire anterior tibial was then angioplastied with a 2.5 mm Sterling and I angioplastied the popliteal artery including above and below the knee joint with a 4 mm Sterling.  After review these images we elected to place a long Eluvia stent using a 6 mm x 150 mm Eluvia in the distal SFA above-knee popliteal artery postdilated with a 5 mm balloon.  I then went ahead and upsized treating the proximal and mid anterior tibial with a 3 mm Sterling.  We now had a widely patent stent with patent flow behind the knee in the popliteal artery and flow into the foot through the anterior tibial.   Procedure:  The patient was identified in the holding area and taken to room 8.  The patient was then placed supine on  the table and prepped and draped in the usual sterile fashion.  A time out was called.  Ultrasound was used to evaluate the right common femoral artery.  It was patent .  A digital ultrasound image was acquired.  A micropuncture needle was used to access the right common femoral artery under ultrasound guidance.  An 018 wire was advanced  without resistance and a micropuncture sheath was placed.  The 018 wire was removed and a benson wire was placed.  The micropuncture sheath was exchanged for a 5 french sheath.  An omniflush catheter was advanced over the wire to the level of L-1.  Next the bifurcation was crossed and the catheter was placed in the left common femoral artery.  Left leg angiogram was obtained with pertinent findings noted above.  I then elected to go ahead and access the tibial vessel retrograde.  I did briefly attempt to access the posterior tibial without success and I elected to access the dorsalis pedis after this was evaluated with ultrasound and I used a pedal stick to get a needle and microwire and then microsheath into the dorsalis pedis retrograde.  I then gave the patient 100 units/kg IV heparin.  I then used the V18 with a CXI catheter to get through the anterior tibial retrograde into the below-knee popliteal artery.  Here I encountered a very heavily calcified chronic total occlusion and I could not further advance retrograde.  I then elected to come antegrade and upsized to a long 6 French Catapault sheath in the right groin over the aortic bifurcation into the left SFA.  I then came antegrade with my glidewire advantage and quick cross catheter trying to get through the popliteal occlusion ultimately I got in a subintimal plane and could not snare my wire that was retrograde.  I had to continue to maneuver until I was able to finally get in the same plane with both antegrade and retrograde wires.  I then used a small snare and snared by retrograde wire and had through and through access from the pedal sheath to the right groin sheath.  We then angioplastied the entire anterior tibial with a 2.5 mm Sterling to nominal pressure for 2 minutes.  The popliteal occlusion above and below the knee joint was treated with a 4 mm Sterling to nominal pressure for 2 minutes.  I then took a hand-injection image that showed  dissection in the above-knee popliteal artery with residual high-grade stenosis.  I then stented the distal SFA above-knee popliteal artery with a long 6 mm x 150 mm Eluvia postdilated with a 5 mm balloon.  I then got a retrograde sheath shot that showed much better flow in the anterior tibial but there still appeared to be a flow-limiting lesion at the ostium of the anterior tibial.  I upsized to a 3 mm Sterling to treat the proximal to mid anterior tibial.  I now had inline flow down the SFA and above-knee popliteal stent through the below-knee popliteal artery into the anterior tibial.  I thought he was as good as I can make him.  Wires and catheters were removed.  A miyx closure device was placed in the right groin after placed a short 6 French sheath.  The pedal sheath was removed and manual pressure was held.  Brisk DP signal at completion.   Plan: Patient is to continue aspirin Plavix statin.  Will arrange follow-up in 1 month.  Cephus Shelling, MD [facility] Office:  [phone]   

## 2022-11-20 NOTE — Discharge Instructions (Addendum)
Femoral  and Left Dorsalis Pedis Site Care This sheet gives you information about how to care for yourself after your procedure. Your health care provider may also give you more specific instructions. If you have problems or questions, contact your health care provider. What can I expect after the procedure?  After the procedure, it is common to have: Bruising that usually fades within 1-2 weeks. Tenderness at the site. Follow these instructions at home: Wound care Follow instructions from your health care provider about how to take care of your insertion site. Make sure you: Wash your hands with soap and water before you change your bandage (dressing). If soap and water are not available, use hand sanitizer. Remove your dressing at 5 pm Do not take baths, swim, or use a hot tub until your health care provider approves. You may shower 24 hours after the procedure. Gently wash the site with plain soap and water. Pat the area dry with a clean towel. Do not rub the site. This may cause bleeding. Do not apply powder or lotion to the site. Keep the site clean and dry. Check your femoral site every day for signs of infection. Check for: Redness, swelling, or pain. Fluid or blood. Warmth. Pus or a bad smell. Activity For the first 2-3 days after your procedure, or as long as directed: Avoid climbing stairs as much as possible. Do not squat. Do not lift anything that is heavier than 10 lb (4.5 kg), or the limit that you are told, until your health care provider says that it is safe. For 5 days Rest as directed. Avoid sitting for a long time without moving. Get up to take short walks every 1-2 hours. Do not drive for 24 hours if you were given a medicine to help you relax (sedative). General instructions Take over-the-counter and prescription medicines only as told by your health care provider. Keep all follow-up visits as told by your health care provider. This is important. Contact a  health care provider if you have: A fever or chills. You have redness, swelling, or pain around your insertion site. Get help right away if: The catheter insertion area swells very fast. You pass out. You suddenly start to sweat or your skin gets clammy. The catheter insertion area is bleeding, and the bleeding does not stop when you hold steady pressure on the area. The area near or just beyond the catheter insertion site becomes pale, cool, tingly, or numb. These symptoms may represent a serious problem that is an emergency. Do not wait to see if the symptoms will go away. Get medical help right away. Call your local emergency services (911 in the [place] Do not drive yourself to the hospital. Summary After the procedure, it is common to have bruising that usually fades within 1-2 weeks. Check your femoral site every day for signs of infection. Do not lift anything that is heavier than 10 lb (4.5 kg), or the limit that you are told, until your health care provider says that it is safe. This information is not intended to replace advice given to you by your health care provider. Make sure you discuss any questions you have with your health care provider. Document Revised: 07/06/2017 Document Reviewed: 07/06/2017 Elsevier Patient Education  2020 [employer].

## 2022-11-21 ENCOUNTER — Encounter (HOSPITAL_COMMUNITY): Payer: Self-pay | Admitting: Vascular Surgery

## 2022-11-21 ENCOUNTER — Other Ambulatory Visit: Payer: Self-pay

## 2022-11-22 ENCOUNTER — Other Ambulatory Visit (HOSPITAL_COMMUNITY): Payer: Self-pay

## 2022-11-23 ENCOUNTER — Other Ambulatory Visit: Payer: Self-pay | Admitting: Vascular Surgery

## 2022-11-24 ENCOUNTER — Other Ambulatory Visit: Payer: Self-pay

## 2022-11-24 ENCOUNTER — Other Ambulatory Visit (HOSPITAL_COMMUNITY): Payer: Self-pay

## 2022-11-24 MED ORDER — ACETAMINOPHEN 500 MG PO TABS
1000.0000 mg | ORAL_TABLET | Freq: Four times a day (QID) | ORAL | 2 refills | Status: DC | PRN
  Filled 2022-11-24 – 2022-12-24 (×6): qty 30, 4d supply, fill #0
  Filled 2022-12-30: qty 30, 4d supply, fill #1
  Filled 2023-01-04 – 2023-01-06 (×2): qty 30, 4d supply, fill #2

## 2022-11-24 NOTE — Telephone Encounter (Signed)
From: [name] To: Office of [name], NP Sent: 11/23/2022 8:30 AM EDT Subject: Medication Renewal Request  Refills have been requested for the following medications:   acetaminophen (TYLENOL) 500 MG tablet  Preferred pharmacy: [facility] Delivery method: Mail

## 2022-11-24 NOTE — Telephone Encounter (Signed)
Please advise KH 

## 2022-11-25 ENCOUNTER — Other Ambulatory Visit: Payer: Self-pay

## 2022-11-27 ENCOUNTER — Other Ambulatory Visit: Payer: Self-pay

## 2022-11-28 ENCOUNTER — Other Ambulatory Visit: Payer: Self-pay

## 2022-11-28 ENCOUNTER — Other Ambulatory Visit (HOSPITAL_COMMUNITY): Payer: Self-pay

## 2022-11-28 ENCOUNTER — Ambulatory Visit (INDEPENDENT_AMBULATORY_CARE_PROVIDER_SITE_OTHER): Payer: No Typology Code available for payment source | Admitting: Internal Medicine

## 2022-11-28 ENCOUNTER — Encounter: Payer: Self-pay | Admitting: Internal Medicine

## 2022-11-28 VITALS — BP 129/80 | HR 78 | Resp 16 | Ht 66.0 in | Wt 200.0 lb

## 2022-11-28 DIAGNOSIS — F17210 Nicotine dependence, cigarettes, uncomplicated: Secondary | ICD-10-CM | POA: Diagnosis not present

## 2022-11-28 DIAGNOSIS — L089 Local infection of the skin and subcutaneous tissue, unspecified: Secondary | ICD-10-CM | POA: Insufficient documentation

## 2022-11-28 DIAGNOSIS — Z720 Tobacco use: Secondary | ICD-10-CM

## 2022-11-28 DIAGNOSIS — T148XXA Other injury of unspecified body region, initial encounter: Secondary | ICD-10-CM | POA: Diagnosis not present

## 2022-11-28 NOTE — Assessment & Plan Note (Addendum)
Infection in the leg with debridement and no further grafting in place.  He has completed prolonged treatment to cover organisms in culture and clinically area appears healed, scabbed, no erythema or other concerns.   He will stop antibiotics and can follow up here as needed  I have personally spent 32 minutes involved in face-to-face and non-face-to-face activities for this patient on the day of the visit. Professional time spent includes the following activities: Preparing to see the patient (review of tests), Obtaining and/or reviewing separately obtained history (admission/discharge record), Performing a medically appropriate examination and/or evaluation , Ordering medications/tests/procedures, referring and communicating with other health care professionals, Documenting clinical information in the EMR, Independently interpreting results (not separately reported), Communicating results to the patient/family/caregiver, Counseling and educating the patient/family/caregiver and Care coordination (not separately reported).

## 2022-11-28 NOTE — Assessment & Plan Note (Signed)
Discussed again the importance of cessation and he is trying hard to quit.

## 2022-11-28 NOTE — Progress Notes (Signed)
   Subjective:    Patient ID: Tony Hicks, male    DOB: Aug 26, 1965, 57 y.o.   MRN: [id number]  HPI Tony Hicks is here for follow up of an infected left SFA to posterior tibial artery PTFE bypass.   He is s/p excision of the bypass graft and redo of the left superficial femoral artery to posterior tibial bypass with a cadaveric cryo great saphenous vein.  There were multiple cultures with different organisms and he has been on doxycycline and cefadroxil since his hospitalization last month, with a plan for 4 weeks, which he has surpassed.  Main complaint is pain.     Review of Systems  Constitutional:  Negative for chills and fever.  Gastrointestinal:  Negative for diarrhea.  Skin:  Negative for rash.       Objective:   Physical Exam Eyes:     General: No scleral icterus. Pulmonary:     Effort: Pulmonary effort is normal.  Neurological:     Mental Status: He is alert.   SH: + tobacco but continues to cut down        Assessment & Plan:

## 2022-12-01 ENCOUNTER — Other Ambulatory Visit: Payer: Self-pay | Admitting: Vascular Surgery

## 2022-12-02 ENCOUNTER — Other Ambulatory Visit: Payer: Self-pay

## 2022-12-02 ENCOUNTER — Other Ambulatory Visit (HOSPITAL_COMMUNITY): Payer: Self-pay

## 2022-12-05 ENCOUNTER — Other Ambulatory Visit (HOSPITAL_COMMUNITY): Payer: Self-pay

## 2022-12-08 ENCOUNTER — Other Ambulatory Visit: Payer: Self-pay

## 2022-12-10 ENCOUNTER — Other Ambulatory Visit: Payer: Self-pay | Admitting: *Deleted

## 2022-12-10 DIAGNOSIS — I70322 Atherosclerosis of unspecified type of bypass graft(s) of the extremities with rest pain, left leg: Secondary | ICD-10-CM

## 2022-12-10 DIAGNOSIS — I739 Peripheral vascular disease, unspecified: Secondary | ICD-10-CM

## 2022-12-19 ENCOUNTER — Ambulatory Visit (INDEPENDENT_AMBULATORY_CARE_PROVIDER_SITE_OTHER)
Admission: RE | Admit: 2022-12-19 | Discharge: 2022-12-19 | Disposition: A | Discharge status: Home / Self Care | Payer: No Typology Code available for payment source | Source: Ambulatory Visit | Attending: Vascular Surgery | Admitting: Vascular Surgery

## 2022-12-19 ENCOUNTER — Ambulatory Visit (HOSPITAL_COMMUNITY)
Admission: RE | Admit: 2022-12-19 | Discharge: 2022-12-19 | Disposition: A | Discharge status: Home / Self Care | Payer: No Typology Code available for payment source | Source: Ambulatory Visit | Attending: Vascular Surgery | Admitting: Vascular Surgery

## 2022-12-19 DIAGNOSIS — I739 Peripheral vascular disease, unspecified: Secondary | ICD-10-CM | POA: Diagnosis present

## 2022-12-19 DIAGNOSIS — I70322 Atherosclerosis of unspecified type of bypass graft(s) of the extremities with rest pain, left leg: Secondary | ICD-10-CM | POA: Insufficient documentation

## 2022-12-19 LAB — VAS US ABI WITH/WO TBI
Left ABI: 0.94
Right ABI: 1.02

## 2022-12-22 ENCOUNTER — Ambulatory Visit (INDEPENDENT_AMBULATORY_CARE_PROVIDER_SITE_OTHER): Payer: No Typology Code available for payment source | Admitting: Nurse Practitioner

## 2022-12-22 ENCOUNTER — Other Ambulatory Visit (HOSPITAL_COMMUNITY): Payer: Self-pay

## 2022-12-22 ENCOUNTER — Other Ambulatory Visit: Payer: Self-pay

## 2022-12-22 ENCOUNTER — Encounter: Payer: Self-pay | Admitting: Nurse Practitioner

## 2022-12-22 ENCOUNTER — Other Ambulatory Visit: Payer: Self-pay | Admitting: Nurse Practitioner

## 2022-12-22 VITALS — BP 133/73 | HR 89 | Temp 97.0°F | Wt 201.0 lb

## 2022-12-22 DIAGNOSIS — E118 Type 2 diabetes mellitus with unspecified complications: Secondary | ICD-10-CM | POA: Diagnosis not present

## 2022-12-22 LAB — POCT GLYCOSYLATED HEMOGLOBIN (HGB A1C): Hemoglobin A1C: 5.4 % (ref 4.0–5.6)

## 2022-12-22 MED ORDER — ACCU-CHEK SOFTCLIX LANCETS MISC
1.0000 | Freq: Three times a day (TID) | 0 refills | Status: DC
Start: 2022-12-22 — End: 2022-12-30
  Filled 2022-12-22: qty 100, 33d supply, fill #0
  Filled 2022-12-24: qty 100, 30d supply, fill #0
  Filled 2022-12-24: qty 100, 34d supply, fill #0

## 2022-12-22 MED ORDER — LANCET DEVICE MISC
1.0000 | Freq: Three times a day (TID) | 0 refills | Status: DC
Start: 2022-12-22 — End: 2023-02-03
  Filled 2022-12-22 – 2022-12-30 (×5): qty 1, 30d supply, fill #0

## 2022-12-22 MED ORDER — ACCU-CHEK GUIDE W/DEVICE KIT
1.0000 | PACK | Freq: Three times a day (TID) | 0 refills | Status: DC
Start: 2022-12-22 — End: 2023-01-04
  Filled 2022-12-22: qty 1, 33d supply, fill #0
  Filled 2022-12-24: qty 1, fill #0
  Filled 2022-12-24: qty 1, 30d supply, fill #0

## 2022-12-22 MED ORDER — OMEGA-3-ACID ETHYL ESTERS 1 G PO CAPS
1.0000 g | ORAL_CAPSULE | Freq: Two times a day (BID) | ORAL | 2 refills | Status: DC
  Filled 2022-12-22 – 2023-01-04 (×3): qty 60, 30d supply, fill #0
  Filled 2023-01-25 – 2023-02-03 (×2): qty 60, 30d supply, fill #1
  Filled 2023-02-20 – 2023-03-02 (×2): qty 60, 30d supply, fill #2

## 2022-12-22 MED ORDER — BLOOD GLUCOSE TEST VI STRP
1.0000 | ORAL_STRIP | Freq: Three times a day (TID) | 0 refills | Status: DC
Start: 2022-12-22 — End: 2022-12-30
  Filled 2022-12-22: qty 100, 33d supply, fill #0
  Filled 2022-12-24 (×3): qty 100, 34d supply, fill #0

## 2022-12-22 NOTE — Progress Notes (Signed)
@Patient  ID: Tony Hicks, male    DOB: [birth year], 57 y.o.   MRN: [id number]  Chief Complaint  Patient presents with   Diabetes    Follow up    Referring provider: [name], NP   HPI  Tony Hicks 57 y.o. male  has a past medical history of Diabetes mellitus without complication (HCC) and Syphilis. To the [facility] for reevaluation of chronic illness.      Diabetes Mellitus: Patient presents for follow up of diabetes. Symptoms: hyperglycemia and paresthesia of the feet.  Patient denies none.  Evaluation to date has been included: hemoglobin A1C.  Home sugars: patient does not check sugars. Treatment to date: no recent interventions.    A1C today 5.4   Denies any other concerns today. Denies any fatigue, chest pain, shortness of breath, HA or dizziness. Denies any blurred vision, numbness or tingling.  Will schedule eye appointment  Will refer to podiatry for diabetic foot care  Patient states that he does not need refills today   No Known Allergies  Immunization History  Administered Date(s) Administered   Influenza,inj,Quad PF,6+ Mos 03/14/2021   Moderna Sars-Covid-2 Vaccination 05/14/2020, 07/17/2020   Pneumococcal Polysaccharide-23 03/14/2021    Past Medical History:  Diagnosis Date   Diabetes mellitus without complication (HCC)    type 2   Family history of adverse reaction to anesthesia    HLD (hyperlipidemia)    Syphilis    Treated  02/14/2019    Tobacco History: Social History   Tobacco Use  Smoking Status Every Day   Packs/day: .5   Types: Cigarettes, Cigars   Passive exposure: Never  Smokeless Tobacco Never  Tobacco Comments   Patch is helping quit-2-5 a week-Been smoking since age 41 yrs old.5/24   Ready to quit: Not Answered Counseling given: Not Answered Tobacco comments: Patch is helping quit-2-5 a week-Been smoking since age 50 yrs old.5/24   Outpatient Encounter Medications as of 12/22/2022  Medication Sig   acetaminophen  (TYLENOL) 325 MG tablet Take 2 tablets (650 mg total) by mouth every 6 (six) hours as needed for mild pain or moderate pain.   acetaminophen (TYLENOL) 500 MG tablet Take 2 tablets (1,000 mg total) by mouth every 6 (six) hours as needed for moderate pain.   aspirin EC 81 MG tablet Take 1 tablet (81 mg total) by mouth daily. Swallow whole.   atorvastatin (LIPITOR) 40 MG tablet Take 1 tablet (40 mg total) by mouth daily.   Blood Glucose Monitoring Suppl DEVI 1 each by Does not apply route in the morning, at noon, and at bedtime. May substitute to any manufacturer covered by patient's insurance.   citalopram (CELEXA) 20 MG tablet Take 1 tablet (20 mg total) by mouth daily.   clopidogrel (PLAVIX) 75 MG tablet Take 1 tablet (75 mg total) by mouth daily.   ferrous sulfate 325 (65 FE) MG tablet Take 1 tablet (325 mg total) by mouth daily with breakfast.   gabapentin (NEURONTIN) 300 MG capsule Take 1 capsule (300 mg total) by mouth 2 (two) times daily.   Glucose Blood (BLOOD GLUCOSE TEST STRIPS) STRP Use as directed in the morning, at noon, and at bedtime. May substitute to any manufacturer covered by patient's insurance.   Lancet Device MISC 1 each by Does not apply route in the morning, at noon, and at bedtime. May substitute to any manufacturer covered by patient's insurance.   Lancets Misc. MISC 1 each by Does not apply route in the morning, at  noon, and at bedtime. May substitute to any manufacturer covered by patient's insurance.   metFORMIN (GLUCOPHAGE) 500 MG tablet Take 1 tablet (500 mg total) by mouth daily with breakfast.   nicotine (NICODERM CQ) 21 mg/24hr patch Place 1 patch (21 mg total) onto the skin daily.   omega-3 acid ethyl esters (LOVAZA) 1 g capsule Take 1 capsule (1 g total) by mouth 2 (two) times daily.   pantoprazole (PROTONIX) 40 MG tablet Take 1 tablet (40 mg total) by mouth daily.   oxyCODONE-acetaminophen (PERCOCET) 5-325 MG tablet Take 1 tablet by mouth every 6 (six) hours as  needed for severe pain. (Patient not taking: Reported on 12/22/2022)   No facility-administered encounter medications on file as of 12/22/2022.     Review of Systems  Review of Systems  Constitutional: Negative.   HENT: Negative.    Cardiovascular: Negative.   Gastrointestinal: Negative.   Allergic/Immunologic: Negative.   Neurological: Negative.   Psychiatric/Behavioral: Negative.         Physical Exam  BP 133/73   Pulse 89   Temp (!) 97 F (36.1 C)   Wt 201 lb (91.2 kg)   SpO2 100%   BMI 32.44 kg/m   Wt Readings from Last 5 Encounters:  12/22/22 201 lb (91.2 kg)  11/28/22 200 lb (90.7 kg)  11/20/22 197 lb (89.4 kg)  11/18/22 197 lb 1.6 oz (89.4 kg)  10/22/22 211 lb 10.3 oz (96 kg)     Physical Exam Vitals and nursing note reviewed.  Constitutional:      General: He is not in acute distress.    Appearance: He is well-developed.  Cardiovascular:     Rate and Rhythm: Normal rate and regular rhythm.  Pulmonary:     Effort: Pulmonary effort is normal.     Breath sounds: Normal breath sounds.  Skin:    General: Skin is warm and dry.  Neurological:     Mental Status: He is alert and oriented to person, place, and time.      Lab Results:  CBC    Component Value Date/Time   WBC 9.6 10/23/2022 0503   RBC 2.84 (L) 10/23/2022 0503   HGB 12.6 (L) 11/20/2022 0948   HGB 14.7 09/19/2022 1209   HCT 37.0 (L) 11/20/2022 0948   HCT 42.1 09/19/2022 1209   PLT 261 10/23/2022 0503   PLT 231 09/19/2022 1209   MCV 89.8 10/23/2022 0503   MCV 92 09/19/2022 1209   MCH 32.4 10/23/2022 0503   MCHC 36.1 (H) 10/23/2022 0503   RDW 11.9 10/23/2022 0503   RDW 12.8 09/19/2022 1209   LYMPHSABS 1.1 10/22/2022 0542   LYMPHSABS 1.7 03/14/2021 1204   MONOABS 0.9 10/22/2022 0542   EOSABS 0.0 10/22/2022 0542   EOSABS 0.1 03/14/2021 1204   BASOSABS 0.1 10/22/2022 0542   BASOSABS 0.0 03/14/2021 1204    BMET    Component Value Date/Time   NA 137 11/20/2022 0948   NA 144  09/19/2022 1209   K 3.9 11/20/2022 0948   CL 103 11/20/2022 0948   CO2 23 10/23/2022 0503   GLUCOSE 111 (H) 11/20/2022 0948   BUN 11 11/20/2022 0948   BUN 11 09/19/2022 1209   CREATININE 0.50 (L) 11/20/2022 0948   CALCIUM 8.1 (L) 10/23/2022 0503   GFRNONAA >60 10/23/2022 0503   GFRAA 101 02/17/2019 1543    BNP No results found for: "BNP"  ProBNP No results found for: "PROBNP"  Imaging: VAS [place] ABI WITH/WO TBI  Result Date:  12/19/2022  LOWER EXTREMITY DOPPLER STUDY Patient Name:  DESTRY DAWN  Date of Exam:   12/19/2022 Medical Rec #: [id number]       Accession #:    [id number] Date of Birth: 1966-03-05       Patient Gender: M Patient Age:   23 years Exam Location:  [facility] Procedure:      VAS [place] ABI WITH/WO TBI Referring Phys: Sherald Hess --------------------------------------------------------------------------------  Indications: Peripheral artery disease. High Risk Factors: Diabetes.  Vascular Interventions: 10/24/2021: Angioplasty of proximal and mid vein bypass                         of the left lower extremity. Angioplasty of native left                         SFA.                          01/14/2021: Left above-knee popliteal to posterior                         tibial artery bypass graft.                         10/22/2022 Redo left superficial femoral to PTA bypass                          11/20/2022 Left anterior tibial angioplasty, angioplasty                         and stent of the left SFA above-knee popliteal artery,                         angioplasty of left above and below knee popliteal                         artery. Limitations: Today's exam was limited due to patient unable to lie flat and              involuntary patient movement. Performing Technologist: Elita Quick RVT  Examination Guidelines: A complete evaluation includes at minimum, Doppler waveform signals and systolic blood pressure reading at the level of bilateral brachial, anterior  tibial, and posterior tibial arteries, when vessel segments are accessible. Bilateral testing is considered an integral part of a complete examination. Photoelectric Plethysmograph (PPG) waveforms and toe systolic pressure readings are included as required and additional duplex testing as needed. Limited examinations for reoccurring indications may be performed as noted.  ABI Findings: +---------+------------------+-----+----------+--------+ Right    Rt Pressure (mmHg)IndexWaveform  Comment  +---------+------------------+-----+----------+--------+ Brachial 125                                       +---------+------------------+-----+----------+--------+ PTA      126               0.99 monophasic         +---------+------------------+-----+----------+--------+ DP       129               1.02 monophasic         +---------+------------------+-----+----------+--------+ Lubertha Sayres  0.29 Abnormal           +---------+------------------+-----+----------+--------+ +---------+------------------+-----+----------+-------+ Left     Lt Pressure (mmHg)IndexWaveform  Comment +---------+------------------+-----+----------+-------+ Brachial 127                                      +---------+------------------+-----+----------+-------+ PTA      99                0.78 monophasic        +---------+------------------+-----+----------+-------+ DP       119               0.94 monophasic        +---------+------------------+-----+----------+-------+ Great Toe32                0.25 Abnormal          +---------+------------------+-----+----------+-------+ +-------+-----------+-----------+------------+------------+ ABI/TBIToday's ABIToday's TBIPrevious ABIPrevious TBI +-------+-----------+-----------+------------+------------+ Right  1.02       0.29       0.93                     +-------+-----------+-----------+------------+------------+ Left   0.94        0.25       0.16                     +-------+-----------+-----------+------------+------------+   Summary: Right: Resting right ankle-brachial index is within normal range, may be falsely elevated. The right toe-brachial index is abnormal. Left: Resting left ankle-brachial index indicates mild left lower extremity arterial disease, may be falsely elevated. . The left toe-brachial index is abnormal. *See table(s) above for measurements and observations.  Electronically signed by Gerarda Fraction on 12/19/2022 at 6:06:32 PM.    Final    VAS [place] LOWER EXTREMITY ARTERIAL DUPLEX  Result Date: 12/19/2022 LOWER EXTREMITY ARTERIAL DUPLEX STUDY Patient Name:  SIGFRED GUERNSEY  Date of Exam:   12/19/2022 Medical Rec #: [id number]       Accession #:    [id number] Date of Birth: 29-Jan-1966       Patient Gender: M Patient Age:   27 years Exam Location:  [facility] Procedure:      VAS [place] LOWER EXTREMITY ARTERIAL DUPLEX Referring Phys: Sherald Hess --------------------------------------------------------------------------------  Indications: Peripheral artery disease  Vascular Interventions: 10/24/2021: Angioplasty of proximal and mid vein bypass                         of the left lower extremity. Angioplasty of native left                         SFA. Above knee popliteal to posterior tibial artery                         bypass graft placed 01/14/2021.                          10/22/2022 Left leg redo superficial saphenous vein to                         cryo saphenous vein bypass with graft excision                         11/20/2022 Left ATA  angioplasty, angioplasty and stent of                         the left SFA above knee popliiteal artery , angioplsty                         of the left above and below knee popliteal artery. Current ABI:            Right ABI 1.02 Left 0.94 Limitations: Patient unable to cooperate, excessive movement throughout the              exam. Performing Technologist: Elita Quick RVT  Examination Guidelines: A complete evaluation includes B-mode imaging, spectral Doppler, color Doppler, and power Doppler as needed of all accessible portions of each vessel. Bilateral testing is considered an integral part of a complete examination. Limited examinations for reoccurring indications may be performed as noted.   +-----------+--------+-----+--------+----------+----------------------+ LEFT       PSV cm/sRatioStenosisWaveform  Comments               +-----------+--------+-----+--------+----------+----------------------+ CFA Prox   159                  triphasic                        +-----------+--------+-----+--------+----------+----------------------+ CFA Distal 169                  triphasic                        +-----------+--------+-----+--------+----------+----------------------+ DFA        118                  biphasic                         +-----------+--------+-----+--------+----------+----------------------+ SFA Prox   65                   triphasic                        +-----------+--------+-----+--------+----------+----------------------+ SFA Mid    79                   monophasicbrisk, occluded bypass +-----------+--------+-----+--------+----------+----------------------+ SFA Distal 60                   monophasicmid to distal , stent  +-----------+--------+-----+--------+----------+----------------------+ ATA Distal 113                  monophasic                       +-----------+--------+-----+--------+----------+----------------------+ PTA Distal 79                   monophasic                       +-----------+--------+-----+--------+----------+----------------------+ PERO Distal63                   monophasic                       +-----------+--------+-----+--------+----------+----------------------+  Left Stent(s): SFA +---------------+---+---------------+----------+--------+ Proximal Stent 40                 monophasic         +---------------+---+---------------+----------+--------+  Mid Stent      40                monophasic         +---------------+---+---------------+----------+--------+ Distal Stent   45                monophasic         +---------------+---+---------------+----------+--------+ Distal to [id number]% stenosis          stenotic +---------------+---+---------------+----------+--------+   Left Graft #1: +--------------------+--------+--------+----------+--------+                     PSV cm/sStenosisWaveform  Comments +--------------------+--------+--------+----------+--------+ Inflow              125             monophasicbrisk    +--------------------+--------+--------+----------+--------+ Proximal Anastomosis134             monophasicbrisk    +--------------------+--------+--------+----------+--------+ Proximal Graft                                occ      +--------------------+--------+--------+----------+--------+ Mid Graft                                     occ      +--------------------+--------+--------+----------+--------+ Distal Graft                                  NV       +--------------------+--------+--------+----------+--------+ Distal Anastomosis                            NV       +--------------------+--------+--------+----------+--------+ Outflow                                       NV       +--------------------+--------+--------+----------+--------+   Summary: Left: Technically difficult exam, unable to thoroughly visualized the lower extremity in its entirety. The left lower extremity appears patent through the common femoral to popliteal artery. The left stent appears patent with increased velocity in the outflow in the 50 - 99% stenosis range, upper end of range. Unable to thoroughly visualized the calf area.   See table(s) above for measurements and observations. Electronically signed by  Gerarda Fraction on 12/19/2022 at 6:06:21 PM.    Final      Assessment & Plan:   Diabetes mellitus type 2, controlled (HCC) - POCT glycosylated hemoglobin (Hb A1C) - Ambulatory referral to Podiatry - Blood Glucose Monitoring Suppl DEVI; 1 each by Does not apply route in the morning, at noon, and at bedtime. May substitute to any manufacturer covered by patient's insurance.  Dispense: 1 each; Refill: 0 - Glucose Blood (BLOOD GLUCOSE TEST STRIPS) STRP; 1 each by In Vitro route in the morning, at noon, and at bedtime. May substitute to any manufacturer covered by patient's insurance.  Dispense: 100 strip; Refill: 0 - Lancet Device MISC; 1 each by Does not apply route in the morning, at noon, and at bedtime. May substitute to any manufacturer covered by patient's insurance.  Dispense: 1 each; Refill: 0 - Lancets Misc. MISC; 1 each by Does not apply route  in the morning, at noon, and at bedtime. May substitute to any manufacturer covered by patient's insurance.  Dispense: 100 each; Refill: 0   - diabetic diet  - Stay active  - Continue current medications  - List given to patient to schedule eye exam  Follow up:  Follow up in 6 months     [name], NP 12/22/2022

## 2022-12-22 NOTE — Patient Instructions (Addendum)
1. Controlled type 2 diabetes mellitus with complication, without long-term current use of insulin (HCC)  - POCT glycosylated hemoglobin (Hb A1C) - Ambulatory referral to Podiatry - Blood Glucose Monitoring Suppl DEVI; 1 each by Does not apply route in the morning, at noon, and at bedtime. May substitute to any manufacturer covered by patient's insurance.  Dispense: 1 each; Refill: 0 - Glucose Blood (BLOOD GLUCOSE TEST STRIPS) STRP; 1 each by In Vitro route in the morning, at noon, and at bedtime. May substitute to any manufacturer covered by patient's insurance.  Dispense: 100 strip; Refill: 0 - Lancet Device MISC; 1 each by Does not apply route in the morning, at noon, and at bedtime. May substitute to any manufacturer covered by patient's insurance.  Dispense: 1 each; Refill: 0 - Lancets Misc. MISC; 1 each by Does not apply route in the morning, at noon, and at bedtime. May substitute to any manufacturer covered by patient's insurance.  Dispense: 100 each; Refill: 0   - diabetic diet  - Stay active  - Continue current medications  - List given to patient to schedule eye exam  Follow up:  Follow up in 6 months

## 2022-12-22 NOTE — Assessment & Plan Note (Signed)
-   POCT glycosylated hemoglobin (Hb A1C) - Ambulatory referral to Podiatry - Blood Glucose Monitoring Suppl DEVI; 1 each by Does not apply route in the morning, at noon, and at bedtime. May substitute to any manufacturer covered by patient's insurance.  Dispense: 1 each; Refill: 0 - Glucose Blood (BLOOD GLUCOSE TEST STRIPS) STRP; 1 each by In Vitro route in the morning, at noon, and at bedtime. May substitute to any manufacturer covered by patient's insurance.  Dispense: 100 strip; Refill: 0 - Lancet Device MISC; 1 each by Does not apply route in the morning, at noon, and at bedtime. May substitute to any manufacturer covered by patient's insurance.  Dispense: 1 each; Refill: 0 - Lancets Misc. MISC; 1 each by Does not apply route in the morning, at noon, and at bedtime. May substitute to any manufacturer covered by patient's insurance.  Dispense: 100 each; Refill: 0   - diabetic diet  - Stay active  - Continue current medications  - List given to patient to schedule eye exam  Follow up:  Follow up in 6 months

## 2022-12-23 ENCOUNTER — Other Ambulatory Visit (HOSPITAL_COMMUNITY): Payer: Self-pay

## 2022-12-23 ENCOUNTER — Other Ambulatory Visit: Payer: Self-pay

## 2022-12-23 ENCOUNTER — Ambulatory Visit (INDEPENDENT_AMBULATORY_CARE_PROVIDER_SITE_OTHER): Payer: Medicaid Other | Admitting: Vascular Surgery

## 2022-12-23 ENCOUNTER — Encounter: Payer: Self-pay | Admitting: Vascular Surgery

## 2022-12-23 ENCOUNTER — Other Ambulatory Visit: Payer: Self-pay | Admitting: Nurse Practitioner

## 2022-12-23 VITALS — BP 157/90 | HR 77 | Temp 97.8°F | Resp 16 | Ht 66.0 in | Wt 205.0 lb

## 2022-12-23 DIAGNOSIS — I70229 Atherosclerosis of native arteries of extremities with rest pain, unspecified extremity: Secondary | ICD-10-CM

## 2022-12-23 MED ORDER — OXYCODONE-ACETAMINOPHEN 5-325 MG PO TABS
1.0000 | ORAL_TABLET | Freq: Three times a day (TID) | ORAL | 0 refills | Status: DC | PRN
  Filled 2022-12-23 – 2022-12-24 (×2): qty 20, 7d supply, fill #0

## 2022-12-23 NOTE — Telephone Encounter (Signed)
Please advise KH 

## 2022-12-23 NOTE — Progress Notes (Signed)
Patient name: Tony Hicks MRN: [id number] DOB: 11/01/1965 Sex: male  REASON FOR CONSULT: One month follow-up  HPI: Tony Hicks is a 57 y.o. male, with history of diabetes and tobacco abuse who presents for hospital follow-up.  He initially underwent left above-knee popliteal artery to posterior tibial bypass with great saphenous vein on 01/14/2021 for critical limb ischemia with tissue loss.  His bypass later occluded and he underwent a redo bypass with PTFE from SFA to PT on 10/08/22 and this got infected and had to be excised and replaced with cryo on 10/08/2022.  This subsequently thrombosed several weeks later.  Most recently underwent recanalization of his native system with SFA and above-knee popliteal angioplasty/stenting and AT angioplasty requiring retrograde tibial access of the DP at the ankle on 11/20/2022.  He has left great toe ulcer has healed.  Still smoking.    Past Medical History:  Diagnosis Date   Diabetes mellitus without complication (HCC)    type 2   Family history of adverse reaction to anesthesia    HLD (hyperlipidemia)    Syphilis    Treated  02/14/2019    Past Surgical History:  Procedure Laterality Date   ABDOMINAL AORTOGRAM W/LOWER EXTREMITY N/A 01/11/2021   Procedure: ABDOMINAL AORTOGRAM W/LOWER EXTREMITY;  Surgeon: Leonie Douglas, MD;  Location: [facility];  Service: Cardiovascular;  Laterality: N/A;   ABDOMINAL AORTOGRAM W/LOWER EXTREMITY N/A 10/24/2021   Procedure: ABDOMINAL AORTOGRAM W/LOWER EXTREMITY;  Surgeon: Cephus Shelling, MD;  Location: [facility];  Service: Cardiovascular;  Laterality: N/A;   ABDOMINAL AORTOGRAM W/LOWER EXTREMITY Bilateral 09/25/2022   Procedure: ABDOMINAL AORTOGRAM W/LOWER EXTREMITY;  Surgeon: Cephus Shelling, MD;  Location: [facility];  Service: Cardiovascular;  Laterality: Bilateral;   FEMORAL-POPLITEAL BYPASS GRAFT Left 10/22/2022   Procedure: LEFT LEG REDO SUPERFICAL SAPHENOUS VEIN TO  CRYO SAPHENOUS VEIN BYPASS W/ GRAFT EXCISION;  Surgeon: Leonie Douglas, MD;  Location: [facility];  Service: Vascular;  Laterality: Left;   FEMORAL-TIBIAL BYPASS GRAFT Left 10/08/2022   Procedure: REDO LEFT SUPERFICIAL FEMORAL ARTERY-POSTERIOR TIBIAL BYPASS WITH PTFE;  Surgeon: Cephus Shelling, MD;  Location: [facility] OR;  Service: Vascular;  Laterality: Left;   LOWER EXTREMITY ANGIOGRAM Left 10/08/2022   Procedure: LOWER LEFT EXTREMITY ANGIOGRAM;  Surgeon: Cephus Shelling, MD;  Location: [facility] OR;  Service: Vascular;  Laterality: Left;   LOWER EXTREMITY ANGIOGRAPHY  11/20/2022   Procedure: Lower Extremity Angiography;  Surgeon: Cephus Shelling, MD;  Location: [facility];  Service: Cardiovascular;;   PERIPHERAL VASCULAR BALLOON ANGIOPLASTY  10/24/2021   Procedure: PERIPHERAL VASCULAR BALLOON ANGIOPLASTY;  Surgeon: Cephus Shelling, MD;  Location: [facility];  Service: Cardiovascular;;  Left SFA and Left Pop Bypass   PERIPHERAL VASCULAR INTERVENTION  11/20/2022   Procedure: PERIPHERAL VASCULAR INTERVENTION;  Surgeon: Cephus Shelling, MD;  Location: [facility];  Service: Cardiovascular;;   THROMBECTOMY OF BYPASS GRAFT FEMORAL- TIBIAL ARTERY Left 01/14/2021   Procedure: LEFT ABOVE KNEE TO  POSTERIOR TIBIAL BYPASS GRAFT WITH GREATER SAPHENOUS VEIN;  Surgeon: Cephus Shelling, MD;  Location: [facility] OR;  Service: Vascular;  Laterality: Left;    Family History  Family history unknown: Yes    SOCIAL HISTORY: Social History   Socioeconomic History   Marital status: Single    Spouse name: Not on file   Number of children: Not on file   Years of education: Not on file   Highest education  level: Not on file  Occupational History   Occupation: Holiday representative  Tobacco Use   Smoking status: Every Day    Packs/day: .5    Types: Cigarettes, Cigars    Passive exposure: Never   Smokeless tobacco: Never   Tobacco comments:    Patch is helping quit-2-5 a week-Been smoking  since age 14 yrs old.5/24  Vaping Use   Vaping Use: Never used  Substance and Sexual Activity   Alcohol use: Yes    Alcohol/week: 20.0 standard drinks of alcohol    Types: 20 Standard drinks or equivalent per week    Comment: Approx 20-25 beers week, cut back now drinking 12 pk as of 10/03/22   Drug use: Not Currently    Frequency: 1.0 times per week    Types: Cocaine, Marijuana    Comment: smoke crack   Sexual activity: Yes    Birth control/protection: Condom  Other Topics Concern   Not on file  Social History Narrative   ** Merged History Encounter **       Social Determinants of Health   Financial Resource Strain: Not on file  Food Insecurity: No Food Insecurity (10/24/2022)   Hunger Vital Sign    Worried About Running Out of Food in the Last Year: Never true    Ran Out of Food in the Last Year: Never true  Transportation Needs: No Transportation Needs (10/24/2022)   PRAPARE - Administrator, Civil Service (Medical): No    Lack of Transportation (Non-Medical): No  Physical Activity: Not on file  Stress: Not on file  Social Connections: Not on file  Intimate Partner Violence: Not At Risk (10/24/2022)   Humiliation, Afraid, Rape, and Kick questionnaire    Fear of Current or Ex-Partner: No    Emotionally Abused: No    Physically Abused: No    Sexually Abused: No    No Known Allergies  Current Outpatient Medications  Medication Sig Dispense Refill   acetaminophen (TYLENOL) 325 MG tablet Take 2 tablets (650 mg total) by mouth every 6 hours as needed for mild pain or moderate pain. 90 tablet 2   acetaminophen (TYLENOL) 500 MG tablet Take 2 tablets (1,000 mg total) by mouth every 6 hours as needed for moderate pain. 30 tablet 2   aspirin EC 81 MG tablet Take 1 tablet (81 mg total) by mouth daily. Swallow whole. 30 tablet 11   atorvastatin (LIPITOR) 40 MG tablet Take 1 tablet (40 mg total) by mouth daily. 90 tablet 1   Blood Glucose Monitoring Suppl (ONETOUCH VERIO  FLEX SYSTEM) w/Device KIT Use as directed in the morning, at noon, and at bedtime. 1 kit 0   citalopram (CELEXA) 20 MG tablet Take 1 tablet (20 mg total) by mouth daily. 30 tablet 3   clopidogrel (PLAVIX) 75 MG tablet Take 1 tablet (75 mg total) by mouth daily. 30 tablet 11   ferrous sulfate 325 (65 FE) MG tablet Take 1 tablet (325 mg total) by mouth daily with breakfast. 120 tablet 0   gabapentin (NEURONTIN) 300 MG capsule Take 1 capsule (300 mg total) by mouth 2 (two) times daily. 180 capsule 1   Glucose Blood (BLOOD GLUCOSE TEST STRIPS) STRP Use as directed in the morning, at noon, and at bedtime. May substitute to any manufacturer covered by patient's insurance. 100 strip 0   Lancet Device MISC Use in the morning, at noon, and at bedtime. May substitute to any manufacturer covered by patient's insurance. 1 each 0  Lancets (ONETOUCH DELICA PLUS LANCET33G) MISC Use as directed in the morning, at noon, and at bedtime. 100 each 0   metFORMIN (GLUCOPHAGE) 500 MG tablet Take 1 tablet (500 mg total) by mouth daily with breakfast. 90 tablet 1   nicotine (NICODERM CQ) 21 mg/24hr patch Place 1 patch (21 mg total) onto the skin daily. 28 patch 0   omega-3 acid ethyl esters (LOVAZA) 1 g capsule Take 1 capsule (1 g total) by mouth 2 (two) times daily. 60 capsule 2   pantoprazole (PROTONIX) 40 MG tablet Take 1 tablet (40 mg total) by mouth daily. 90 tablet 1   oxyCODONE-acetaminophen (PERCOCET) 5-325 MG tablet Take 1 tablet by mouth every 6 (six) hours as needed for severe pain. (Patient not taking: Reported on 12/23/2022) 20 tablet 0   No current facility-administered medications for this visit.    REVIEW OF SYSTEMS:  [X]  denotes positive finding, [ ]  denotes negative finding Cardiac  Comments:  Chest pain or chest pressure:    Shortness of breath upon exertion:    Short of breath when lying flat:    Irregular heart rhythm:        Vascular    Pain in calf, thigh, or hip brought on by ambulation:     Pain in feet at night that wakes you up from your sleep:     Blood clot in your veins:    Leg swelling:         Pulmonary    Oxygen at home:    Productive cough:     Wheezing:         Neurologic    Sudden weakness in arms or legs:     Sudden numbness in arms or legs:     Sudden onset of difficulty speaking or slurred speech:    Temporary loss of vision in one eye:     Problems with dizziness:         Gastrointestinal    Blood in stool:     Vomited blood:         Genitourinary    Burning when urinating:     Blood in urine:        Psychiatric    Major depression:         Hematologic    Bleeding problems:    Problems with blood clotting too easily:        Skin    Rashes or ulcers:        Constitutional    Fever or chills:      PHYSICAL EXAM: Vitals:   12/23/22 1356  BP: (!) 157/90  Pulse: 77  Resp: 16  Temp: 97.8 F (36.6 C)  TempSrc: Temporal  SpO2: 98%  Weight: 205 lb (93 kg)  Height: 5\' 6"  (1.676 m)    GENERAL: The patient is a well-nourished male, in no acute distress. The vital signs are documented above. CARDIAC: There is a regular rate and rhythm.  VASCULAR:  Bilateral femoral pulses palpable Left AT and DP brisk by doppler Left great toe wound healed  PULMONARY: No respiratory distress. ABDOMEN: Soft and non-tender. MUSCULOSKELETAL: There are no major deformities or cyanosis. NEUROLOGIC: No focal weakness or paresthesias are detected. PSYCHIATRIC: The patient has a normal affect.  DATA:   ABIs today are 1.02 on the right monophasic and 0.94 on the left monophasic  Left leg arterial duplex shows his SFA popliteal artery stents are patent but distal to the stent has a high-grade stenosis with a  velocity of 444  Assessment/Plan:   57 y.o. male, with history of diabetes and tobacco abuse who presents for hospital follow-up.  He initially underwent left above-knee popliteal artery to posterior tibial bypass with great saphenous vein on  01/14/2021 for critical limb ischemia with tissue loss.  His bypass later occluded and he underwent a redo bypass with PTFE from SFA to PT on 10/08/22 and this got infected and had to be excised and replaced with cryo on 10/08/2022.  This subsequently thrombosed several weeks later.  Most recently underwent recanalization of his native system with SFA and above-knee popliteal angioplasty/stenting and AT angioplasty requiring retrograde tibial access of the DP at the ankle on 11/20/2022.  His left SFA/popliteal stents are patent on duplex today.  He has a brisk dorsalis pedis and anterior tibial signal at the ankle since recent recanalization of his native system.  His wound has healed.  Very pleased with his progress.  He does have a high-grade stenosis on duplex distal to the SFA/pop stents with a velocity of 444.  I have recommended aortogram with left leg angiogram with possible intervention including angioplasty and stenting and atherectomy.  This will be from right transfemoral access.  Discussed if his native stents fail he has really has no other options for limb salvage.  Will schedule in cath lab.  Risk and benefits discussed.   Cephus Shelling, MD [facility] Office: [phone]

## 2022-12-24 ENCOUNTER — Encounter: Payer: Self-pay | Admitting: Pharmacist

## 2022-12-24 ENCOUNTER — Other Ambulatory Visit: Payer: Self-pay

## 2022-12-24 ENCOUNTER — Other Ambulatory Visit: Payer: Self-pay | Admitting: Vascular Surgery

## 2022-12-24 ENCOUNTER — Other Ambulatory Visit (HOSPITAL_COMMUNITY): Payer: Self-pay

## 2022-12-24 DIAGNOSIS — I70229 Atherosclerosis of native arteries of extremities with rest pain, unspecified extremity: Secondary | ICD-10-CM

## 2022-12-24 MED ORDER — NICOTINE 21 MG/24HR TD PT24
21.0000 mg | MEDICATED_PATCH | Freq: Every day | TRANSDERMAL | 0 refills | Status: DC
  Filled 2022-12-24 (×2): qty 28, 28d supply, fill #0

## 2022-12-25 ENCOUNTER — Other Ambulatory Visit (HOSPITAL_COMMUNITY): Payer: Self-pay

## 2022-12-26 ENCOUNTER — Other Ambulatory Visit (HOSPITAL_COMMUNITY): Payer: Self-pay

## 2022-12-30 ENCOUNTER — Encounter: Payer: Self-pay | Admitting: Podiatry

## 2022-12-30 ENCOUNTER — Other Ambulatory Visit: Payer: Self-pay | Admitting: Nurse Practitioner

## 2022-12-30 ENCOUNTER — Other Ambulatory Visit (HOSPITAL_COMMUNITY): Payer: Self-pay

## 2022-12-30 ENCOUNTER — Other Ambulatory Visit: Payer: Self-pay

## 2022-12-30 DIAGNOSIS — E118 Type 2 diabetes mellitus with unspecified complications: Secondary | ICD-10-CM

## 2022-12-30 DIAGNOSIS — G621 Alcoholic polyneuropathy: Secondary | ICD-10-CM

## 2022-12-31 ENCOUNTER — Other Ambulatory Visit (HOSPITAL_COMMUNITY): Payer: Self-pay

## 2022-12-31 ENCOUNTER — Other Ambulatory Visit: Payer: Self-pay

## 2022-12-31 ENCOUNTER — Ambulatory Visit: Payer: Medicaid Other | Admitting: Podiatry

## 2022-12-31 MED ORDER — METFORMIN HCL 500 MG PO TABS
500.0000 mg | ORAL_TABLET | Freq: Every day | ORAL | 1 refills | Status: DC
Start: 2022-12-31 — End: 2023-05-21
  Filled 2022-12-31 – 2023-04-15 (×2): qty 90, 90d supply, fill #0
  Filled 2023-05-01 – 2023-05-12 (×2): qty 90, 90d supply, fill #1

## 2022-12-31 MED ORDER — GABAPENTIN 300 MG PO CAPS
300.0000 mg | ORAL_CAPSULE | Freq: Two times a day (BID) | ORAL | 1 refills | Status: DC
Start: 2022-12-31 — End: 2023-05-21
  Filled 2022-12-31 – 2023-04-15 (×4): qty 180, 90d supply, fill #0
  Filled 2023-05-01 – 2023-05-12 (×2): qty 180, 90d supply, fill #1

## 2022-12-31 MED ORDER — BLOOD GLUCOSE TEST VI STRP
1.0000 | ORAL_STRIP | Freq: Three times a day (TID) | 0 refills | Status: DC
Start: 2022-12-31 — End: 2023-02-20
  Filled 2022-12-31 – 2023-01-23 (×3): qty 100, 34d supply, fill #0

## 2022-12-31 MED ORDER — ACCU-CHEK SOFTCLIX LANCETS MISC
1.0000 | Freq: Three times a day (TID) | 0 refills | Status: DC
Start: 2022-12-31 — End: 2023-02-20
  Filled 2022-12-31 – 2023-01-23 (×3): qty 100, 34d supply, fill #0

## 2023-01-01 ENCOUNTER — Ambulatory Visit (HOSPITAL_COMMUNITY)
Admission: RE | Admit: 2023-01-01 | Discharge: 2023-01-01 | Disposition: A | Discharge status: Home / Self Care | Payer: No Typology Code available for payment source | Attending: Vascular Surgery | Admitting: Vascular Surgery

## 2023-01-01 ENCOUNTER — Other Ambulatory Visit: Payer: Self-pay

## 2023-01-01 ENCOUNTER — Encounter (HOSPITAL_COMMUNITY)
Admission: RE | Disposition: A | Discharge status: Home / Self Care | Payer: Self-pay | Source: Home / Self Care | Attending: Vascular Surgery

## 2023-01-01 ENCOUNTER — Other Ambulatory Visit: Payer: Self-pay | Admitting: Vascular Surgery

## 2023-01-01 ENCOUNTER — Other Ambulatory Visit (HOSPITAL_COMMUNITY): Payer: Self-pay

## 2023-01-01 DIAGNOSIS — T82858A Stenosis of vascular prosthetic devices, implants and grafts, initial encounter: Secondary | ICD-10-CM

## 2023-01-01 DIAGNOSIS — I998 Other disorder of circulatory system: Secondary | ICD-10-CM

## 2023-01-01 DIAGNOSIS — E1151 Type 2 diabetes mellitus with diabetic peripheral angiopathy without gangrene: Secondary | ICD-10-CM | POA: Diagnosis present

## 2023-01-01 DIAGNOSIS — Z7984 Long term (current) use of oral hypoglycemic drugs: Secondary | ICD-10-CM | POA: Insufficient documentation

## 2023-01-01 DIAGNOSIS — F17210 Nicotine dependence, cigarettes, uncomplicated: Secondary | ICD-10-CM | POA: Diagnosis not present

## 2023-01-01 DIAGNOSIS — I70212 Atherosclerosis of native arteries of extremities with intermittent claudication, left leg: Secondary | ICD-10-CM | POA: Insufficient documentation

## 2023-01-01 DIAGNOSIS — I70229 Atherosclerosis of native arteries of extremities with rest pain, unspecified extremity: Secondary | ICD-10-CM

## 2023-01-01 HISTORY — PX: ABDOMINAL AORTOGRAM W/LOWER EXTREMITY: CATH118223

## 2023-01-01 HISTORY — PX: PERIPHERAL VASCULAR BALLOON ANGIOPLASTY: CATH118281

## 2023-01-01 LAB — POCT I-STAT, CHEM 8
BUN: 11 mg/dL (ref 6–20)
Calcium, Ion: 1.1 mmol/L — ABNORMAL LOW (ref 1.15–1.40)
Chloride: 104 mmol/L (ref 98–111)
Creatinine, Ser: 0.7 mg/dL (ref 0.61–1.24)
Glucose, Bld: 96 mg/dL (ref 70–99)
HCT: 40 % (ref 39.0–52.0)
Hemoglobin: 13.6 g/dL (ref 13.0–17.0)
Potassium: 4.2 mmol/L (ref 3.5–5.1)
Sodium: 136 mmol/L (ref 135–145)
TCO2: 25 mmol/L (ref 22–32)

## 2023-01-01 LAB — GLUCOSE, CAPILLARY: Glucose-Capillary: 87 mg/dL (ref 70–99)

## 2023-01-01 SURGERY — ABDOMINAL AORTOGRAM W/LOWER EXTREMITY
Anesthesia: LOCAL

## 2023-01-01 MED ORDER — LIDOCAINE HCL (PF) 1 % IJ SOLN
INTRAMUSCULAR | Status: AC
  Filled 2023-01-01: qty 30

## 2023-01-01 MED ORDER — NITROGLYCERIN 1 MG/10 ML FOR IR/CATH LAB
INTRA_ARTERIAL | Status: AC
  Filled 2023-01-01: qty 10

## 2023-01-01 MED ORDER — SODIUM CHLORIDE 0.9% FLUSH: 3.0000 mL | INTRAVENOUS | Status: DC | PRN

## 2023-01-01 MED ORDER — SODIUM CHLORIDE 0.9 % IV SOLN: INTRAVENOUS | Status: DC

## 2023-01-01 MED ORDER — SODIUM CHLORIDE 0.9% FLUSH: 3.0000 mL | Freq: Two times a day (BID) | INTRAVENOUS | Status: DC

## 2023-01-01 MED ORDER — LIDOCAINE HCL (PF) 1 % IJ SOLN
INTRAMUSCULAR | Status: DC | PRN
  Administered 2023-01-01: 15 mL

## 2023-01-01 MED ORDER — MIDAZOLAM HCL 2 MG/2ML IJ SOLN
INTRAMUSCULAR | Status: DC | PRN
  Administered 2023-01-01 (×2): 1 mg via INTRAVENOUS

## 2023-01-01 MED ORDER — FENTANYL CITRATE (PF) 100 MCG/2ML IJ SOLN
INTRAMUSCULAR | Status: DC | PRN
  Administered 2023-01-01 (×4): 25 ug via INTRAVENOUS

## 2023-01-01 MED ORDER — LABETALOL HCL 5 MG/ML IV SOLN: 10.0000 mg | INTRAVENOUS | Status: DC | PRN

## 2023-01-01 MED ORDER — IODIXANOL 320 MG/ML IV SOLN
INTRAVENOUS | Status: DC | PRN
  Administered 2023-01-01: 150 mL

## 2023-01-01 MED ORDER — HEPARIN (PORCINE) IN NACL 1000-0.9 UT/500ML-% IV SOLN
INTRAVENOUS | Status: DC | PRN
  Administered 2023-01-01 (×2): 500 mL

## 2023-01-01 MED ORDER — SODIUM CHLORIDE 0.9 % IV SOLN: 250.0000 mL | INTRAVENOUS | Status: DC | PRN

## 2023-01-01 MED ORDER — OXYCODONE-ACETAMINOPHEN 5-325 MG PO TABS
1.0000 | ORAL_TABLET | Freq: Three times a day (TID) | ORAL | 0 refills | Status: DC | PRN
  Filled 2023-01-01 (×2): qty 20, 7d supply, fill #0

## 2023-01-01 MED ORDER — FENTANYL CITRATE (PF) 100 MCG/2ML IJ SOLN
INTRAMUSCULAR | Status: AC
  Filled 2023-01-01: qty 2

## 2023-01-01 MED ORDER — HYDRALAZINE HCL 20 MG/ML IJ SOLN: 5.0000 mg | INTRAMUSCULAR | Status: DC | PRN

## 2023-01-01 MED ORDER — NITROGLYCERIN 1 MG/10 ML FOR IR/CATH LAB
INTRA_ARTERIAL | Status: DC | PRN
  Administered 2023-01-01: 200 ug via INTRA_ARTERIAL

## 2023-01-01 MED ORDER — MIDAZOLAM HCL 2 MG/2ML IJ SOLN
INTRAMUSCULAR | Status: AC
  Filled 2023-01-01: qty 2

## 2023-01-01 MED ORDER — ONDANSETRON HCL 4 MG/2ML IJ SOLN: 4.0000 mg | Freq: Four times a day (QID) | INTRAMUSCULAR | Status: DC | PRN

## 2023-01-01 MED ORDER — HEPARIN SODIUM (PORCINE) 1000 UNIT/ML IJ SOLN
INTRAMUSCULAR | Status: AC
  Filled 2023-01-01: qty 10

## 2023-01-01 MED ORDER — HEPARIN SODIUM (PORCINE) 1000 UNIT/ML IJ SOLN
INTRAMUSCULAR | Status: DC | PRN
  Administered 2023-01-01: 9000 [IU] via INTRAVENOUS

## 2023-01-01 MED ORDER — ACETAMINOPHEN 325 MG PO TABS: 650.0000 mg | ORAL_TABLET | ORAL | Status: DC | PRN

## 2023-01-01 SURGICAL SUPPLY — 26 items
BALLN COYOTE OTW 3X150X150 (BALLOONS) ×2
BALLN STERLING OTW 5X100X135 (BALLOONS) ×2
BALLN STERLING OTW 5X40X135 (BALLOONS) ×2
BALLOON COYOTE OTW 3X150X150 (BALLOONS) IMPLANT
BALLOON STERLING OTW 5X100X135 (BALLOONS) IMPLANT
BALLOON STERLING OTW 5X40X135 (BALLOONS) IMPLANT
CATH OMNI FLUSH 5F 65CM (CATHETERS) IMPLANT
CATH SHOCKWAVE 5.5X60 (CATHETERS) IMPLANT
CATH TEMPO AQUA 5F 100CM (CATHETERS) IMPLANT
DEVICE CLOSURE MYNXGRIP 6/7F (Vascular Products) IMPLANT
GLIDEWIRE ADV .035X260CM (WIRE) IMPLANT
KIT ENCORE 26 ADVANTAGE (KITS) IMPLANT
KIT MICROPUNCTURE NIT STIFF (SHEATH) IMPLANT
KIT PV (KITS) ×2 IMPLANT
SHEATH CATAPULT 6FR 45 (SHEATH) IMPLANT
SHEATH PINNACLE 5F 10CM (SHEATH) IMPLANT
SHEATH PINNACLE 6F 10CM (SHEATH) IMPLANT
SHEATH PROBE COVER 6X72 (BAG) IMPLANT
STENT ELUVIA 6X100X130 (Permanent Stent) IMPLANT
STENT ELUVIA 6X40X130 (Permanent Stent) IMPLANT
STOPCOCK MORSE 400PSI 3WAY (MISCELLANEOUS) IMPLANT
SYR MEDRAD MARK 7 150ML (SYRINGE) ×2 IMPLANT
TRANSDUCER W/STOPCOCK (MISCELLANEOUS) ×2 IMPLANT
TRAY PV CATH (CUSTOM PROCEDURE TRAY) ×2 IMPLANT
WIRE BENTSON .035X145CM (WIRE) IMPLANT
WIRE SPARTACORE .014X300CM (WIRE) IMPLANT

## 2023-01-01 NOTE — Op Note (Signed)
Patient name: Tony Hicks MRN: [id number] DOB: [birth year] Sex: male  01/01/2023 Pre-operative Diagnosis: Critical limb ischemia of the left lower extremity with tissue loss and high-grade stenosis in the distal SFA/popliteal stent Post-operative diagnosis:  Same Surgeon:  Cephus Shelling, MD Procedure Performed: 1.  Ultrasound-guided access right common femoral artery 2.  Aortogram with catheter selection of aorta 3.  Left lower extremity arteriogram with selection of third order branches 4.  Catheter selection of left SFA with angiography 5.  Shockwave lithotripsy of the left popliteal artery behind the knee (5.5 mm x 60 mm shockwave lithotripsy balloon x 300 pulses) 6.  Angioplasty and stent of left popliteal artery behind the knee (6 mm x 40 mm Eluvia postdilated with a 5 mm Mustang) 7.  Left anterior tibial artery angioplasty (3 mm x 150 mm coyote) 8.  Left mid SFA angioplasty with stent placement (6 mm x 100 mm Eluvia postdilated with a 5 mm Mustang) 9.  96 minutes of monitored moderate conscious sedation time 10.  Mynx closure of the right common femoral artery  Indications: 57 year old male that has had multiple failed tibial bypasses in the left leg for critical limb ischemia with tissue loss.  He most recently underwent recanalization of his native arterial system in the left leg for occlusive disease.  Duplex showed high-grade stenosis in the distal SFA above-knee popliteal stent.  He presents today for left leg angiogram and possible intervention after risks benefits discussed.  Findings:   Aortogram showed no flow-limiting stenosis in the aortoiliac segment.  The bilateral renal arteries were patent as well as the infrarenal aorta.  The iliac arteries were widely patent.  Left lower extremity arteriogram showed widely patent common femoral and profunda.  The SFA was patent.  There was a 50% moderate stenosis in the mid SFA.  The distal SFA above-knee popliteal stent was  patent and had a high-grade calcified stenosis in the distal stent with a second high-grade calcified stenosis at the knee joint.  There was single-vessel runoff to the anterior tibial that had multiple segments of recurrent high-grade stenosis >80%.  Initially treated the distal stent in the above knee popliteal artery and the popliteal artery behind the knee given the high-grade 80% calcified stenosis.  I used a 5.5 mm x 60 mm shockwave lithotripsy balloon.  Initially went to 2 atm at 60 pulses, 4 atm at 60 pulses, 6 atm at 60 pulses and then 8 atm at 60 pulses.  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  This was stented with a 6 mm x 40 mm Eluvia postdilated with a 5 mm Mustang.  Widely patent vessel at completion with no significant residual stenosis.  I then went down and angioplastied the entire AT into the foot including the dorsalis pedis with a long 3 mm coyote balloon.  I then at completion stented the mid SFA moderate stenosis with a 6 mm x 100 mm drug-coated Eluvia.  Widely patent stents in the left SFA popliteal artery with single-vessel runoff in the anterior tibial and inline flow to the foot at completion.   Procedure:  The patient was identified in the holding area and taken to room 8.  The patient was then placed supine on the table and prepped and draped in the usual sterile fashion.  A time out was called.  The patient received Versed and fentanyl for conscious moderate sedation.  Vital signs were monitored including heart rate, respiratory rate, oxygenation and blood pressure.  I was present for all of moderate sedation.  Ultrasound was used to evaluate the right common femoral artery.  It was patent .  A digital ultrasound image was acquired.  A micropuncture needle was used to access the right common femoral artery under ultrasound guidance.  An 018 wire was advanced without resistance and a micropuncture sheath was placed.  The 018 wire was removed and a  benson wire was placed.  The micropuncture sheath was exchanged for a 5 french sheath.  An omniflush catheter was advanced over the wire to the level of L-1.  An abdominal angiogram was obtained.  Next, using the omniflush catheter and a benson wire, the aortic bifurcation was crossed and the catheter was placed into theleft external iliac artery and left runoff was obtained.  After evaluating images elected to go ahead and plan intervention.  A glidewire advantage was placed in the left SFA and exchanged for a long 6 French Catapault sheath in the right groin over the aortic bifurcation.  Patient was given 100 units/kg IV heparin.  I then used a 0.014 coyote wire to get down to the popliteal disease and into the anterior tibial artery and into the foot.  I initially went to the popliteal artery behind the knee and this was treated with a 5.5 mm x 60 mm shockwave lithotripsy balloon and this was inflated to 2 atm for 60 pulses, 4 atm for 60 pulses, 6 atm for 60 pulses and finally 8 atm for 60 pulses.  Imaging showed what looked like significant residual disease with a dissection.  I then elected to extend my stent to just at the knee joint.  I then placed a 6 mm x 40 mm drug-coated Eluvia postdilated with a 5 mm Mustang in the left popliteal artery behind the knee.  This was widely patent with no residual dissection.  I then went down and treated the entire anterior tibial across the ankle joint including the dorsalis pedis with a 3 mm x 150 mm coyote angioplasty balloon with each segment at two minutes.  I then went up to the SFA and treated the mid SFA moderate disease with a 6 mm x 100 mm Eluvia postdilated with a 5 mm Mustang.  We gave nitroglycerin and final imaging showed widely patent stents with preserved runoff in the anterior tibial with filling of the dorsalis pedis into the foot.  Wires and catheters were removed and I placed a short 5 French sheath in the right groin.  A mynx closure device was  deployed.  Plan: Excellent results after intervention today.  Aspirin Plavix statin.  Will arrange follow-up in 1 month.   Cephus Shelling, MD [facility] Office: [phone]

## 2023-01-01 NOTE — Discharge Instructions (Addendum)
NO METFORMIN FOR 2 DAYS    Femoral Site Care This sheet gives you information about how to care for yourself after your procedure. Your health care provider may also give you more specific instructions. If you have problems or questions, contact your health care provider. What can I expect after the procedure?  After the procedure, it is common to have: Bruising that usually fades within 1-2 weeks. Tenderness at the site. Follow these instructions at home: Wound care Follow instructions from your health care provider about how to take care of your insertion site. Make sure you: Wash your hands with soap and water before you change your bandage (dressing). If soap and water are not available, use hand sanitizer. Remove your dressing as told by your health care provider. 24 hours Do not take baths, swim, or use a hot tub until your health care provider approves. You may shower 24-48 hours after the procedure or as told by your health care provider. Gently wash the site with plain soap and water. Pat the area dry with a clean towel. Do not rub the site. This may cause bleeding. Do not apply powder or lotion to the site. Keep the site clean and dry. Check your femoral site every day for signs of infection. Check for: Redness, swelling, or pain. Fluid or blood. Warmth. Pus or a bad smell. Activity For the first 2-3 days after your procedure, or as long as directed: Avoid climbing stairs as much as possible. Do not squat. Do not lift anything that is heavier than 10 lb (4.5 kg), or the limit that you are told, until your health care provider says that it is safe. For 5 days Rest as directed. Avoid sitting for a long time without moving. Get up to take short walks every 1-2 hours. Do not drive for 24 hours if you were given a medicine to help you relax (sedative). General instructions Take over-the-counter and prescription medicines only as told by your health care provider. Keep all  follow-up visits as told by your health care provider. This is important. Contact a health care provider if you have: A fever or chills. You have redness, swelling, or pain around your insertion site. Get help right away if: The catheter insertion area swells very fast. You pass out. You suddenly start to sweat or your skin gets clammy. The catheter insertion area is bleeding, and the bleeding does not stop when you hold steady pressure on the area. The area near or just beyond the catheter insertion site becomes pale, cool, tingly, or numb. These symptoms may represent a serious problem that is an emergency. Do not wait to see if the symptoms will go away. Get medical help right away. Call your local emergency services (911 in the [place] Do not drive yourself to the hospital. Summary After the procedure, it is common to have bruising that usually fades within 1-2 weeks. Check your femoral site every day for signs of infection. Do not lift anything that is heavier than 10 lb (4.5 kg), or the limit that you are told, until your health care provider says that it is safe. This information is not intended to replace advice given to you by your health care provider. Make sure you discuss any questions you have with your health care provider. Document Revised: 07/06/2017 Document Reviewed: 07/06/2017 Elsevier Patient Education  2020 [employer].

## 2023-01-01 NOTE — Progress Notes (Signed)
Dr Clemson in to see client 

## 2023-01-01 NOTE — H&P (Signed)
History and Physical Interval Note:  01/01/2023 11:20 AM  Tony Hicks  has presented today for surgery, with the diagnosis of lower limb ischemia.  The various methods of treatment have been discussed with the patient and family. After consideration of risks, benefits and other options for treatment, the patient has consented to  Procedure(s): ABDOMINAL AORTOGRAM W/LOWER EXTREMITY (N/A) as a surgical intervention.  The patient's history has been reviewed, patient examined, no change in status, stable for surgery.  I have reviewed the patient's chart and labs.  Questions were answered to the patient's satisfaction.    Evaluate high grade stenosis left SFA/pop stents.  Cephus Shelling     Patient name: Tony Hicks            MRN: [id number]        DOB: Apr 09, 1966          Sex: male   REASON FOR CONSULT: One month follow-up   HPI: Tony Hicks is a 57 y.o. male, with history of diabetes and tobacco abuse who presents for hospital follow-up.  He initially underwent left above-knee popliteal artery to posterior tibial bypass with great saphenous vein on 01/14/2021 for critical limb ischemia with tissue loss.  His bypass later occluded and he underwent a redo bypass with PTFE from SFA to PT on 10/08/22 and this got infected and had to be excised and replaced with cryo on 10/08/2022.  This subsequently thrombosed several weeks later.  Most recently underwent recanalization of his native system with SFA and above-knee popliteal angioplasty/stenting and AT angioplasty requiring retrograde tibial access of the DP at the ankle on 11/20/2022.   He has left great toe ulcer has healed.  Still smoking.         Past Medical History:  Diagnosis Date   Diabetes mellitus without complication (HCC)      type 2   Family history of adverse reaction to anesthesia     HLD (hyperlipidemia)     Syphilis      Treated  02/14/2019           Past Surgical History:  Procedure Laterality Date   ABDOMINAL  AORTOGRAM W/LOWER EXTREMITY N/A 01/11/2021    Procedure: ABDOMINAL AORTOGRAM W/LOWER EXTREMITY;  Surgeon: Leonie Douglas, MD;  Location: [facility];  Service: Cardiovascular;  Laterality: N/A;   ABDOMINAL AORTOGRAM W/LOWER EXTREMITY N/A 10/24/2021    Procedure: ABDOMINAL AORTOGRAM W/LOWER EXTREMITY;  Surgeon: Cephus Shelling, MD;  Location: [facility];  Service: Cardiovascular;  Laterality: N/A;   ABDOMINAL AORTOGRAM W/LOWER EXTREMITY Bilateral 09/25/2022    Procedure: ABDOMINAL AORTOGRAM W/LOWER EXTREMITY;  Surgeon: Cephus Shelling, MD;  Location: [facility];  Service: Cardiovascular;  Laterality: Bilateral;   FEMORAL-POPLITEAL BYPASS GRAFT Left 10/22/2022    Procedure: LEFT LEG REDO SUPERFICAL SAPHENOUS VEIN TO CRYO SAPHENOUS VEIN BYPASS W/ GRAFT EXCISION;  Surgeon: Leonie Douglas, MD;  Location: [facility];  Service: Vascular;  Laterality: Left;   FEMORAL-TIBIAL BYPASS GRAFT Left 10/08/2022    Procedure: REDO LEFT SUPERFICIAL FEMORAL ARTERY-POSTERIOR TIBIAL BYPASS WITH PTFE;  Surgeon: Cephus Shelling, MD;  Location: [facility] OR;  Service: Vascular;  Laterality: Left;   LOWER EXTREMITY ANGIOGRAM Left 10/08/2022    Procedure: LOWER LEFT EXTREMITY ANGIOGRAM;  Surgeon: Cephus Shelling, MD;  Location[facility];  Service: Vascular;  Laterality: Left;   LOWER EXTREMITY ANGIOGRAPHY   11/20/2022    Procedure: Lower Extremity Angiography;  Surgeon: Cephus Shelling, MD;  Location: [facility];  Service: Cardiovascular;;   PERIPHERAL VASCULAR BALLOON ANGIOPLASTY   10/24/2021    Procedure: PERIPHERAL VASCULAR BALLOON ANGIOPLASTY;  Surgeon: Cephus Shelling, MD;  Location: [facility];  Service: Cardiovascular;;  Left SFA and Left Pop Bypass   PERIPHERAL VASCULAR INTERVENTION   11/20/2022    Procedure: PERIPHERAL VASCULAR INTERVENTION;  Surgeon: Cephus Shelling, MD;  Location: [facility];  Service: Cardiovascular;;   THROMBECTOMY OF BYPASS GRAFT  FEMORAL- TIBIAL ARTERY Left 01/14/2021    Procedure: LEFT ABOVE KNEE TO  POSTERIOR TIBIAL BYPASS GRAFT WITH GREATER SAPHENOUS VEIN;  Surgeon: Cephus Shelling, MD;  Location: [facility] OR;  Service: Vascular;  Laterality: Left;      Family History  Family history unknown: Yes      SOCIAL HISTORY: Social History         Socioeconomic History   Marital status: Single      Spouse name: Not on file   Number of children: Not on file   Years of education: Not on file   Highest education level: Not on file  Occupational History   Occupation: Holiday representative  Tobacco Use   Smoking status: Every Day      Packs/day: .5      Types: Cigarettes, Cigars      Passive exposure: Never   Smokeless tobacco: Never   Tobacco comments:      Patch is helping quit-2-5 a week-Been smoking since age 27 yrs old.5/24  Vaping Use   Vaping Use: Never used  Substance and Sexual Activity   Alcohol use: Yes      Alcohol/week: 20.0 standard drinks of alcohol      Types: 20 Standard drinks or equivalent per week      Comment: Approx 20-25 beers week, cut back now drinking 12 pk as of 10/03/22   Drug use: Not Currently      Frequency: 1.0 times per week      Types: Cocaine, Marijuana      Comment: smoke crack   Sexual activity: Yes      Birth control/protection: Condom  Other Topics Concern   Not on file  Social History Narrative    ** Merged History Encounter **         Social Determinants of Health        Financial Resource Strain: Not on file  Food Insecurity: No Food Insecurity (10/24/2022)    Hunger Vital Sign     Worried About Running Out of Food in the Last Year: Never true     Ran Out of Food in the Last Year: Never true  Transportation Needs: No Transportation Needs (10/24/2022)    PRAPARE - Therapist, art (Medical): No     Lack of Transportation (Non-Medical): No  Physical Activity: Not on file  Stress: Not on file  Social Connections: Not on file  Intimate  Partner Violence: Not At Risk (10/24/2022)    Humiliation, Afraid, Rape, and Kick questionnaire     Fear of Current or Ex-Partner: No     Emotionally Abused: No     Physically Abused: No     Sexually Abused: No      No Known Allergies         Current Outpatient Medications  Medication Sig Dispense Refill   acetaminophen (TYLENOL) 325 MG tablet Take 2 tablets (650 mg total) by mouth every 6 hours as needed for mild pain or moderate pain.  90 tablet 2   acetaminophen (TYLENOL) 500 MG tablet Take 2 tablets (1,000 mg total) by mouth every 6 hours as needed for moderate pain. 30 tablet 2   aspirin EC 81 MG tablet Take 1 tablet (81 mg total) by mouth daily. Swallow whole. 30 tablet 11   atorvastatin (LIPITOR) 40 MG tablet Take 1 tablet (40 mg total) by mouth daily. 90 tablet 1   Blood Glucose Monitoring Suppl (ONETOUCH VERIO FLEX SYSTEM) w/Device KIT Use as directed in the morning, at noon, and at bedtime. 1 kit 0   citalopram (CELEXA) 20 MG tablet Take 1 tablet (20 mg total) by mouth daily. 30 tablet 3   clopidogrel (PLAVIX) 75 MG tablet Take 1 tablet (75 mg total) by mouth daily. 30 tablet 11   ferrous sulfate 325 (65 FE) MG tablet Take 1 tablet (325 mg total) by mouth daily with breakfast. 120 tablet 0   gabapentin (NEURONTIN) 300 MG capsule Take 1 capsule (300 mg total) by mouth 2 (two) times daily. 180 capsule 1   Glucose Blood (BLOOD GLUCOSE TEST STRIPS) STRP Use as directed in the morning, at noon, and at bedtime. May substitute to any manufacturer covered by patient's insurance. 100 strip 0   Lancet Device MISC Use in the morning, at noon, and at bedtime. May substitute to any manufacturer covered by patient's insurance. 1 each 0   Lancets (ONETOUCH DELICA PLUS LANCET33G) MISC Use as directed in the morning, at noon, and at bedtime. 100 each 0   metFORMIN (GLUCOPHAGE) 500 MG tablet Take 1 tablet (500 mg total) by mouth daily with breakfast. 90 tablet 1   nicotine (NICODERM CQ) 21 mg/24hr  patch Place 1 patch (21 mg total) onto the skin daily. 28 patch 0   omega-3 acid ethyl esters (LOVAZA) 1 g capsule Take 1 capsule (1 g total) by mouth 2 (two) times daily. 60 capsule 2   pantoprazole (PROTONIX) 40 MG tablet Take 1 tablet (40 mg total) by mouth daily. 90 tablet 1   oxyCODONE-acetaminophen (PERCOCET) 5-325 MG tablet Take 1 tablet by mouth every 6 (six) hours as needed for severe pain. (Patient not taking: Reported on 12/23/2022) 20 tablet 0    No current facility-administered medications for this visit.      REVIEW OF SYSTEMS:  [X]  denotes positive finding, [ ]  denotes negative finding Cardiac   Comments:  Chest pain or chest pressure:      Shortness of breath upon exertion:      Short of breath when lying flat:      Irregular heart rhythm:             Vascular      Pain in calf, thigh, or hip brought on by ambulation:      Pain in feet at night that wakes you up from your sleep:       Blood clot in your veins:      Leg swelling:              Pulmonary      Oxygen at home:      Productive cough:       Wheezing:              Neurologic      Sudden weakness in arms or legs:       Sudden numbness in arms or legs:       Sudden onset of difficulty speaking or slurred speech:      Temporary loss of  vision in one eye:       Problems with dizziness:              Gastrointestinal      Blood in stool:       Vomited blood:              Genitourinary      Burning when urinating:       Blood in urine:             Psychiatric      Major depression:              Hematologic      Bleeding problems:      Problems with blood clotting too easily:             Skin      Rashes or ulcers:             Constitutional      Fever or chills:          PHYSICAL EXAM:    Vitals:    12/23/22 1356  BP: (!) 157/90  Pulse: 77  Resp: 16  Temp: 97.8 F (36.6 C)  TempSrc: Temporal  SpO2: 98%  Weight: 205 lb (93 kg)  Height: 5\' 6"  (1.676 m)      GENERAL: The patient is  a well-nourished male, in no acute distress. The vital signs are documented above. CARDIAC: There is a regular rate and rhythm.  VASCULAR:  Bilateral femoral pulses palpable Left AT and DP brisk by doppler Left great toe wound healed  PULMONARY: No respiratory distress. ABDOMEN: Soft and non-tender. MUSCULOSKELETAL: There are no major deformities or cyanosis. NEUROLOGIC: No focal weakness or paresthesias are detected. PSYCHIATRIC: The patient has a normal affect.   DATA:    ABIs today are 1.02 on the right monophasic and 0.94 on the left monophasic   Left leg arterial duplex shows his SFA popliteal artery stents are patent but distal to the stent has a high-grade stenosis with a velocity of 444   Assessment/Plan:    57 y.o. male, with history of diabetes and tobacco abuse who presents for hospital follow-up.  He initially underwent left above-knee popliteal artery to posterior tibial bypass with great saphenous vein on 01/14/2021 for critical limb ischemia with tissue loss.  His bypass later occluded and he underwent a redo bypass with PTFE from SFA to PT on 10/08/22 and this got infected and had to be excised and replaced with cryo on 10/08/2022.  This subsequently thrombosed several weeks later.  Most recently underwent recanalization of his native system with SFA and above-knee popliteal angioplasty/stenting and AT angioplasty requiring retrograde tibial access of the DP at the ankle on 11/20/2022.   His left SFA/popliteal stents are patent on duplex today.  He has a brisk dorsalis pedis and anterior tibial signal at the ankle since recent recanalization of his native system.  His wound has healed.  Very pleased with his progress.  He does have a high-grade stenosis on duplex distal to the SFA/pop stents with a velocity of 444.  I have recommended aortogram with left leg angiogram with possible intervention including angioplasty and stenting and atherectomy.  This will be from right transfemoral  access.  Discussed if his native stents fail he has really has no other options for limb salvage.  Will schedule in cath lab.  Risk and benefits discussed.     Cephus Shelling, MD [facility]  Office: [phone]

## 2023-01-01 NOTE — Progress Notes (Signed)
Client up and walked and tolerated well; right groin stable, no bleeding or hematoma 

## 2023-01-02 ENCOUNTER — Other Ambulatory Visit (HOSPITAL_COMMUNITY): Payer: Self-pay

## 2023-01-02 ENCOUNTER — Telehealth: Payer: Self-pay | Admitting: Vascular Surgery

## 2023-01-02 ENCOUNTER — Encounter (HOSPITAL_COMMUNITY): Payer: Self-pay | Admitting: Vascular Surgery

## 2023-01-02 NOTE — Telephone Encounter (Signed)
-----   Message from Cephus Shelling, MD sent at 01/01/2023  2:25 PM EDT ----- Patient name: Tony Hicks            MRN: [id number]        DOB: March 13, 1966          Sex: male   01/01/2023 Pre-operative Diagnosis: Critical limb ischemia of the left lower extremity with tissue loss and high-grade stenosis in the distal SFA/popliteal stent Post-operative diagnosis:  Same Surgeon:  Cephus Shelling, MD Procedure Performed: 1.  Ultrasound-guided access right common femoral artery 2.  Aortogram with catheter selection of aorta 3.  Left lower extremity arteriogram with selection of third order branches 4.  Catheter selection of left SFA with angiography 5.  Shockwave lithotripsy of the left popliteal artery behind the knee (5.5 mm x 60 mm shockwave lithotripsy balloon x 300 pulses) 6.  Angioplasty and stent of left popliteal artery behind the knee (6 mm x 40 mm Eluvia postdilated with a 5 mm Mustang) 7.  Left anterior tibial artery angioplasty (3 mm x 150 mm coyote) 8.  Left mid SFA angioplasty with stent placement (6 mm x 100 mm Eluvia postdilated with a 5 mm Mustang) 9.  96 minutes of monitored moderate conscious sedation time 10.  Mynx closure of the right common femoral artery  #Can you arrange follow-up in one month with left leg arterial duplex and ABI?  Thanks,  Thayer Ohm

## 2023-01-04 ENCOUNTER — Other Ambulatory Visit: Payer: Self-pay | Admitting: Nurse Practitioner

## 2023-01-04 DIAGNOSIS — D509 Iron deficiency anemia, unspecified: Secondary | ICD-10-CM

## 2023-01-04 DIAGNOSIS — E118 Type 2 diabetes mellitus with unspecified complications: Secondary | ICD-10-CM

## 2023-01-05 ENCOUNTER — Other Ambulatory Visit: Payer: Self-pay

## 2023-01-05 MED ORDER — ACCU-CHEK GUIDE W/DEVICE KIT
1.0000 | PACK | Freq: Three times a day (TID) | 0 refills | Status: DC
Start: 2023-01-05 — End: 2023-02-20
  Filled 2023-01-05 – 2023-02-03 (×5): qty 1, 30d supply, fill #0

## 2023-01-05 MED ORDER — FERROUS SULFATE 325 (65 FE) MG PO TABS
325.0000 mg | ORAL_TABLET | Freq: Every day | ORAL | 0 refills | Status: DC
Start: 2023-01-05 — End: 2023-06-20
  Filled 2023-01-05: qty 120, 120d supply, fill #0
  Filled 2023-01-23: qty 30, 30d supply, fill #0
  Filled 2023-02-20: qty 30, 30d supply, fill #1
  Filled 2023-04-15: qty 30, 30d supply, fill #2
  Filled 2023-05-01 – 2023-05-18 (×3): qty 30, 30d supply, fill #3

## 2023-01-06 ENCOUNTER — Other Ambulatory Visit: Payer: Self-pay

## 2023-01-06 ENCOUNTER — Other Ambulatory Visit (HOSPITAL_COMMUNITY): Payer: Self-pay

## 2023-01-07 ENCOUNTER — Ambulatory Visit: Payer: Medicaid Other | Admitting: Podiatry

## 2023-01-12 ENCOUNTER — Telehealth: Payer: Self-pay

## 2023-01-12 NOTE — Telephone Encounter (Signed)
Caller: Patient sent Mychart msgs requesting a refill on pain meds. Replied with a msg that pt needed to be evaluated before refill sent.  Concern: Leg pain down the entirety of the leg  Pt denies redness, swelling, or discoloration  Location: left leg  Description:  since procedure  Procedure: Interventional Vascular Procedure  Resolution:  Pt offered appt for 7/9, but he needed to secure transportation. Appt slot held, waited for pt to call back  Next Appt: Waiting for pt to secure transportation, pt stated that he would be unable to make the 7/9 appt, so appt slot opened for scheduling.

## 2023-01-15 ENCOUNTER — Other Ambulatory Visit: Payer: Self-pay

## 2023-01-20 ENCOUNTER — Other Ambulatory Visit: Payer: Self-pay

## 2023-01-23 ENCOUNTER — Other Ambulatory Visit (HOSPITAL_COMMUNITY): Payer: Self-pay

## 2023-01-23 ENCOUNTER — Other Ambulatory Visit: Payer: Self-pay

## 2023-01-26 ENCOUNTER — Other Ambulatory Visit: Payer: Self-pay

## 2023-01-26 ENCOUNTER — Other Ambulatory Visit: Payer: Self-pay | Admitting: *Deleted

## 2023-01-26 DIAGNOSIS — I70229 Atherosclerosis of native arteries of extremities with rest pain, unspecified extremity: Secondary | ICD-10-CM

## 2023-01-26 DIAGNOSIS — I739 Peripheral vascular disease, unspecified: Secondary | ICD-10-CM

## 2023-01-29 ENCOUNTER — Other Ambulatory Visit: Payer: Self-pay

## 2023-02-01 ENCOUNTER — Other Ambulatory Visit: Payer: Self-pay | Admitting: Nurse Practitioner

## 2023-02-02 ENCOUNTER — Other Ambulatory Visit (HOSPITAL_COMMUNITY): Payer: Self-pay

## 2023-02-02 MED ORDER — ACETAMINOPHEN 500 MG PO TABS
1000.0000 mg | ORAL_TABLET | Freq: Four times a day (QID) | ORAL | 2 refills | Status: DC | PRN
  Filled 2023-02-02 (×2): qty 30, 4d supply, fill #0
  Filled 2023-02-20 (×2): qty 30, 4d supply, fill #1
  Filled 2023-04-15 (×2): qty 30, 4d supply, fill #2

## 2023-02-03 ENCOUNTER — Other Ambulatory Visit: Payer: Self-pay | Admitting: Nurse Practitioner

## 2023-02-03 ENCOUNTER — Other Ambulatory Visit: Payer: Self-pay

## 2023-02-03 ENCOUNTER — Other Ambulatory Visit (HOSPITAL_COMMUNITY): Payer: Self-pay

## 2023-02-03 DIAGNOSIS — E118 Type 2 diabetes mellitus with unspecified complications: Secondary | ICD-10-CM

## 2023-02-03 MED ORDER — LANCET DEVICE MISC
1.0000 | Freq: Three times a day (TID) | 0 refills | Status: DC
Start: 2023-02-03 — End: 2023-02-20
  Filled 2023-02-03 – 2023-02-12 (×2): qty 1, 30d supply, fill #0

## 2023-02-05 ENCOUNTER — Other Ambulatory Visit (HOSPITAL_COMMUNITY): Payer: Self-pay

## 2023-02-06 ENCOUNTER — Other Ambulatory Visit (HOSPITAL_COMMUNITY): Payer: Self-pay

## 2023-02-13 ENCOUNTER — Other Ambulatory Visit (HOSPITAL_COMMUNITY): Payer: Self-pay

## 2023-02-17 ENCOUNTER — Ambulatory Visit (HOSPITAL_COMMUNITY): Payer: No Typology Code available for payment source

## 2023-02-20 ENCOUNTER — Other Ambulatory Visit: Payer: Self-pay | Admitting: Family Medicine

## 2023-02-20 ENCOUNTER — Other Ambulatory Visit: Payer: Self-pay

## 2023-02-20 ENCOUNTER — Other Ambulatory Visit: Payer: Self-pay | Admitting: Nurse Practitioner

## 2023-02-20 ENCOUNTER — Other Ambulatory Visit (HOSPITAL_COMMUNITY): Payer: Self-pay

## 2023-02-20 DIAGNOSIS — K219 Gastro-esophageal reflux disease without esophagitis: Secondary | ICD-10-CM

## 2023-02-20 DIAGNOSIS — E118 Type 2 diabetes mellitus with unspecified complications: Secondary | ICD-10-CM

## 2023-02-20 MED ORDER — PANTOPRAZOLE SODIUM 40 MG PO TBEC
40.0000 mg | DELAYED_RELEASE_TABLET | Freq: Every day | ORAL | 1 refills | Status: DC
Start: 2023-02-20 — End: 2023-05-21
  Filled 2023-02-20 – 2023-05-18 (×3): qty 90, 90d supply, fill #0

## 2023-02-20 MED ORDER — CITALOPRAM HYDROBROMIDE 20 MG PO TABS
20.0000 mg | ORAL_TABLET | Freq: Every day | ORAL | 3 refills | Status: DC
  Filled 2023-02-20 – 2023-03-02 (×2): qty 30, 30d supply, fill #0
  Filled 2023-04-15: qty 30, 30d supply, fill #1
  Filled 2023-05-01 – 2023-05-18 (×3): qty 30, 30d supply, fill #2

## 2023-02-20 MED ORDER — LANCET DEVICE MISC
1.0000 | Freq: Three times a day (TID) | 0 refills | Status: AC
Start: 2023-02-20 — End: 2023-03-22
  Filled 2023-02-20 – 2023-03-13 (×2): qty 1, 30d supply, fill #0

## 2023-02-20 MED ORDER — NICOTINE 21 MG/24HR TD PT24
21.0000 mg | MEDICATED_PATCH | Freq: Every day | TRANSDERMAL | 0 refills | Status: DC
  Filled 2023-02-20: qty 28, 28d supply, fill #0

## 2023-02-20 MED ORDER — ACCU-CHEK GUIDE W/DEVICE KIT
1.0000 | PACK | Freq: Three times a day (TID) | 0 refills | Status: DC
Start: 2023-02-20 — End: 2023-05-01
  Filled 2023-02-20 – 2023-03-13 (×4): qty 1, 30d supply, fill #0

## 2023-02-20 MED ORDER — BLOOD GLUCOSE TEST VI STRP
1.0000 | ORAL_STRIP | Freq: Three times a day (TID) | 0 refills | Status: DC
Start: 2023-02-20 — End: 2023-03-12
  Filled 2023-02-20: qty 100, 34d supply, fill #0

## 2023-02-20 MED ORDER — ACCU-CHEK SOFTCLIX LANCETS MISC
1.0000 | Freq: Three times a day (TID) | 0 refills | Status: DC
Start: 2023-02-20 — End: 2023-05-01
  Filled 2023-02-20: qty 100, 34d supply, fill #0

## 2023-02-21 ENCOUNTER — Other Ambulatory Visit (HOSPITAL_COMMUNITY): Payer: Self-pay

## 2023-02-23 ENCOUNTER — Other Ambulatory Visit: Payer: Self-pay

## 2023-02-23 ENCOUNTER — Other Ambulatory Visit (HOSPITAL_COMMUNITY): Payer: Self-pay

## 2023-03-03 ENCOUNTER — Other Ambulatory Visit: Payer: Self-pay

## 2023-03-03 ENCOUNTER — Other Ambulatory Visit (HOSPITAL_COMMUNITY): Payer: Self-pay

## 2023-03-12 ENCOUNTER — Other Ambulatory Visit: Payer: Self-pay

## 2023-03-12 ENCOUNTER — Other Ambulatory Visit: Payer: Self-pay | Admitting: Nurse Practitioner

## 2023-03-12 ENCOUNTER — Other Ambulatory Visit (HOSPITAL_COMMUNITY): Payer: Self-pay

## 2023-03-12 DIAGNOSIS — E118 Type 2 diabetes mellitus with unspecified complications: Secondary | ICD-10-CM

## 2023-03-12 MED ORDER — BLOOD GLUCOSE TEST VI STRP
1.0000 | ORAL_STRIP | Freq: Three times a day (TID) | 0 refills | Status: DC
Start: 2023-03-12 — End: 2023-05-01
  Filled 2023-03-12 – 2023-04-15 (×2): qty 100, 34d supply, fill #0

## 2023-03-13 ENCOUNTER — Other Ambulatory Visit (HOSPITAL_COMMUNITY): Payer: Self-pay

## 2023-03-13 ENCOUNTER — Ambulatory Visit: Payer: Self-pay | Admitting: Nurse Practitioner

## 2023-03-13 ENCOUNTER — Other Ambulatory Visit: Payer: Self-pay

## 2023-03-16 ENCOUNTER — Other Ambulatory Visit: Payer: Self-pay

## 2023-03-20 ENCOUNTER — Ambulatory Visit: Payer: Self-pay | Admitting: Nurse Practitioner

## 2023-04-01 ENCOUNTER — Other Ambulatory Visit: Payer: Self-pay | Admitting: *Deleted

## 2023-04-01 DIAGNOSIS — I70229 Atherosclerosis of native arteries of extremities with rest pain, unspecified extremity: Secondary | ICD-10-CM

## 2023-04-01 DIAGNOSIS — I739 Peripheral vascular disease, unspecified: Secondary | ICD-10-CM

## 2023-04-06 NOTE — Progress Notes (Unsigned)
HISTORY AND PHYSICAL     CC:  follow up. Requesting Provider:  [name], NP  HPI: This is a 57 y.o. male who is here today for follow up for PAD.  Pt has hx of redo left lower extremity SFA to posterior tibial bypass with PTFE by Dr. Lenell Antu on 10/08/2022.  He developed an infected bypass graft and underwent excision of infected bypass with redo left SFA to PT bypass using cadaveric CryoVein by Dr. Chestine Spore on 10/22/2022.  Bypass was performed due to left great toe and heel wounds. ID was also involved and he continues to be on 6 weeks of antibiotics.  On 11/20/2022, he underwent angiogram with left ATA angioplasty, angioplasty and stent of the left SFA AK popliteal artery, angioplasty left above and below knee popliteal artery for CLI with tissue loss by Dr. Chestine Spore.  On 01/01/2023 he underwent angiogram with shockwave lithotripsy of left popliteal artery, angioplasty and stent of left popliteal artery behind the knee, left ATA angioplasty, left mid SFA angioplasty with stent 01/01/2023 by Dr. Chestine Spore for CLI and tissue loss.   He has single vessel runoff in the ATA and inline flow to the foot at completion of his last angiogram.   He was seen 12/23/2022 and at that time, his left great toe ulceration had healed.  His duplex at that time revealed increased velocities in his SFA/popliteal stents and he underwent angiogram as above.  He was continuing to smoke at that time.   The pt returns today for follow up.  ***  The pt is on a statin for cholesterol management.    The pt is on an aspirin.    Other AC:  plavix The pt is not on medication for hypertension.  The pt is  on medication for diabetes. Tobacco hx:  ***  Pt does *** have family hx of AAA.  Past Medical History:  Diagnosis Date   Diabetes mellitus without complication (HCC)    type 2   Family history of adverse reaction to anesthesia    HLD (hyperlipidemia)    Syphilis    Treated  02/14/2019    Past Surgical History:  Procedure  Laterality Date   ABDOMINAL AORTOGRAM W/LOWER EXTREMITY N/A 01/11/2021   Procedure: ABDOMINAL AORTOGRAM W/LOWER EXTREMITY;  Surgeon: Leonie Douglas, MD;  Location: [facility];  Service: Cardiovascular;  Laterality: N/A;   ABDOMINAL AORTOGRAM W/LOWER EXTREMITY N/A 10/24/2021   Procedure: ABDOMINAL AORTOGRAM W/LOWER EXTREMITY;  Surgeon: Cephus Shelling, MD;  Location: [facility];  Service: Cardiovascular;  Laterality: N/A;   ABDOMINAL AORTOGRAM W/LOWER EXTREMITY Bilateral 09/25/2022   Procedure: ABDOMINAL AORTOGRAM W/LOWER EXTREMITY;  Surgeon: Cephus Shelling, MD;  Location: [facility];  Service: Cardiovascular;  Laterality: Bilateral;   ABDOMINAL AORTOGRAM W/LOWER EXTREMITY N/A 01/01/2023   Procedure: ABDOMINAL AORTOGRAM W/LOWER EXTREMITY;  Surgeon: Cephus Shelling, MD;  Location: [facility];  Service: Cardiovascular;  Laterality: N/A;   FEMORAL-POPLITEAL BYPASS GRAFT Left 10/22/2022   Procedure: LEFT LEG REDO SUPERFICAL SAPHENOUS VEIN TO CRYO SAPHENOUS VEIN BYPASS W/ GRAFT EXCISION;  Surgeon: Leonie Douglas, MD;  Location: [facility];  Service: Vascular;  Laterality: Left;   FEMORAL-TIBIAL BYPASS GRAFT Left 10/08/2022   Procedure: REDO LEFT SUPERFICIAL FEMORAL ARTERY-POSTERIOR TIBIAL BYPASS WITH PTFE;  Surgeon: Cephus Shelling, MD;  Location: [facility] OR;  Service: Vascular;  Laterality: Left;   LOWER EXTREMITY ANGIOGRAM Left 10/08/2022   Procedure: LOWER LEFT EXTREMITY ANGIOGRAM;  Surgeon: Sherald Hess  J, MD;  Location: [facility];  Service: Vascular;  Laterality: Left;   LOWER EXTREMITY ANGIOGRAPHY  11/20/2022   Procedure: Lower Extremity Angiography;  Surgeon: Cephus Shelling, MD;  Location: [facility];  Service: Cardiovascular;;   PERIPHERAL VASCULAR BALLOON ANGIOPLASTY  10/24/2021   Procedure: PERIPHERAL VASCULAR BALLOON ANGIOPLASTY;  Surgeon: Cephus Shelling, MD;  Location: [facility];  Service: Cardiovascular;;  Left SFA and  Left Pop Bypass   PERIPHERAL VASCULAR BALLOON ANGIOPLASTY  01/01/2023   Procedure: PERIPHERAL VASCULAR BALLOON ANGIOPLASTY;  Surgeon: Cephus Shelling, MD;  Location: [facility];  Service: Cardiovascular;;  lt popliteal - lithotripsy   PERIPHERAL VASCULAR INTERVENTION  11/20/2022   Procedure: PERIPHERAL VASCULAR INTERVENTION;  Surgeon: Cephus Shelling, MD;  [facility]: [facility];  Service: Cardiovascular;;   THROMBECTOMY OF BYPASS GRAFT FEMORAL- TIBIAL ARTERY Left 01/14/2021   Procedure: LEFT ABOVE KNEE TO  POSTERIOR TIBIAL BYPASS GRAFT WITH GREATER SAPHENOUS VEIN;  Surgeon: Cephus Shelling, MD;  Location: [facility];  Service: Vascular;  Laterality: Left;    No Known Allergies  Current Outpatient Medications  Medication Sig Dispense Refill   Accu-Chek Softclix Lancets lancets Testing in the morning, at noon, and at bedtime. 100 each 0   acetaminophen (TYLENOL) 325 MG tablet Take 2 tablets (650 mg total) by mouth every 6 hours as needed for mild pain or moderate pain. 90 tablet 2   acetaminophen (TYLENOL) 500 MG tablet Take 2 tablets (1,000 mg total) by mouth every 6 hours as needed for moderate pain. 30 tablet 2   aspirin EC 81 MG tablet Take 1 tablet (81 mg total) by mouth daily. Swallow whole. 30 tablet 11   atorvastatin (LIPITOR) 40 MG tablet Take 1 tablet (40 mg total) by mouth daily. 90 tablet 1   Blood Glucose Monitoring Suppl (ACCU-CHEK GUIDE) w/Device KIT Use to test 3 times a day in the morning, at noon, and at bedtime. 1 kit 0   citalopram (CELEXA) 20 MG tablet Take 1 tablet (20 mg total) by mouth daily. 30 tablet 3   clopidogrel (PLAVIX) 75 MG tablet Take 1 tablet (75 mg total) by mouth daily. 30 tablet 11   ferrous sulfate (FEROSUL) 325 (65 FE) MG tablet Take 1 tablet (325 mg) by mouth daily with breakfast. 120 tablet 0   gabapentin (NEURONTIN) 300 MG capsule Take 1 capsule (300 mg total) by mouth 2 (two) times daily. 180 capsule 1   Glucose Blood (BLOOD  GLUCOSE TEST STRIPS) STRP Use as directed in the morning, at noon, and at bedtime. 100 strip 0   metFORMIN (GLUCOPHAGE) 500 MG tablet Take 1 tablet (500 mg total) by mouth daily with breakfast. 90 tablet 1   nicotine (NICODERM CQ) 21 mg/24hr patch Place 1 patch (21 mg total) onto the skin daily. 28 patch 0   omega-3 acid ethyl esters (LOVAZA) 1 g capsule Take 1 capsule (1 g total) by mouth 2 (two) times daily. 60 capsule 2   oxyCODONE-acetaminophen (PERCOCET) 5-325 MG tablet Take 1 tablet by mouth every 8 (eight) hours as needed for moderate pain. 20 tablet 0   pantoprazole (PROTONIX) 40 MG tablet Take 1 tablet (40 mg total) by mouth daily. 90 tablet 1   No current facility-administered medications for this visit.    Family History  Family history unknown: Yes    Social History   Socioeconomic History   Marital status: Single    Spouse name: Not on file  Number of children: Not on file   Years of education: Not on file   Highest education level: Not on file  Occupational History   Occupation: Holiday representative  Tobacco Use   Smoking status: Every Day    Current packs/day: 0.50    Types: Cigarettes, Cigars    Passive exposure: Never   Smokeless tobacco: Never   Tobacco comments:    Patch is helping quit-2-5 a week-Been smoking since age 54 yrs old.5/24  Vaping Use   Vaping status: Never Used  Substance and Sexual Activity   Alcohol use: Yes    Alcohol/week: 20.0 standard drinks of alcohol    Types: 20 Standard drinks or equivalent per week    Comment: Approx 20-25 beers week, cut back now drinking 12 pk as of 10/03/22   Drug use: Not Currently    Frequency: 1.0 times per week    Types: Cocaine, Marijuana    Comment: smoke crack   Sexual activity: Yes    Birth control/protection: Condom  Other Topics Concern   Not on file  Social History Narrative   ** Merged History Encounter **       Social Determinants of Health   Financial Resource Strain: Not on file  Food  Insecurity: No Food Insecurity (10/24/2022)   Hunger Vital Sign    Worried About Running Out of Food in the Last Year: Never true    Ran Out of Food in the Last Year: Never true  Transportation Needs: No Transportation Needs (10/24/2022)   PRAPARE - Administrator, Civil Service (Medical): No    Lack of Transportation (Non-Medical): No  Physical Activity: Not on file  Stress: Not on file  Social Connections: Not on file  Intimate Partner Violence: Not At Risk (10/24/2022)   Humiliation, Afraid, Rape, and Kick questionnaire    Fear of Current or Ex-Partner: No    Emotionally Abused: No    Physically Abused: No    Sexually Abused: No     REVIEW OF SYSTEMS:  *** [X]  denotes positive finding, [ ]  denotes negative finding Cardiac  Comments:  Chest pain or chest pressure:    Shortness of breath upon exertion:    Short of breath when lying flat:    Irregular heart rhythm:        Vascular    Pain in calf, thigh, or hip brought on by ambulation:    Pain in feet at night that wakes you up from your sleep:     Blood clot in your veins:    Leg swelling:         Pulmonary    Oxygen at home:    Productive cough:     Wheezing:         Neurologic    Sudden weakness in arms or legs:     Sudden numbness in arms or legs:     Sudden onset of difficulty speaking or slurred speech:    Temporary loss of vision in one eye:     Problems with dizziness:         Gastrointestinal    Blood in stool:     Vomited blood:         Genitourinary    Burning when urinating:     Blood in urine:        Psychiatric    Major depression:         Hematologic    Bleeding problems:    Problems with blood clotting too  easily:        Skin    Rashes or ulcers:        Constitutional    Fever or chills:      PHYSICAL EXAMINATION:  ***  General:  WDWN in NAD; vital signs documented above Gait: Not observed HENT: WNL, normocephalic Pulmonary: normal non-labored breathing , without  wheezing Cardiac: {Desc; regular/irreg:14544} HR, {With/Without:20273} carotid bruit*** Abdomen: soft, NT; aortic pulse is *** palpable Skin: {With/Without:20273} rashes Vascular Exam/Pulses:  Right Left  Radial {Exam; arterial pulse strength 0-4:30167} {Exam; arterial pulse strength 0-4:30167}  Femoral {Exam; arterial pulse strength 0-4:30167} {Exam; arterial pulse strength 0-4:30167}  Popliteal {Exam; arterial pulse strength 0-4:30167} {Exam; arterial pulse strength 0-4:30167}  DP {Exam; arterial pulse strength 0-4:30167} {Exam; arterial pulse strength 0-4:30167}  PT {Exam; arterial pulse strength 0-4:30167} {Exam; arterial pulse strength 0-4:30167}  Peroneal *** ***   Extremities: {With/Without:20273} ischemic changes, {With/Without:20273} Gangrene , {With/Without:20273} cellulitis; {With/Without:20273} open wounds Musculoskeletal: no muscle wasting or atrophy  Neurologic: A&O X 3 Psychiatric:  The pt has {Desc; normal/abnormal:11317::"Normal"} affect.   Non-Invasive Vascular Imaging:   ABI's/TBI's on 04/07/2023: Right:  *** - Great toe pressure: *** Left:  *** - Great toe pressure: ***  Arterial duplex on 04/07/2023: ***  Previous ABI's/TBI's on 12/19/2022: Right:  1.02/0.29 - Great toe pressure: 37 Left:  0.94/0.25 - Great toe pressure:  32     ASSESSMENT/PLAN:: 57 y.o. male here for follow up for PAD with hx of  redo left lower extremity SFA to posterior tibial bypass with PTFE by Dr. Lenell Antu on 10/08/2022.  He developed an infected bypass graft and underwent excision of infected bypass with redo left SFA to PT bypass using cadaveric CryoVein by Dr. Chestine Spore on 10/22/2022.  Bypass was performed due to left great toe and heel wounds. ID was also involved and he continues to be on 6 weeks of antibiotics.  On 11/20/2022, he underwent angiogram with left ATA angioplasty, angioplasty and stent of the left SFA AK popliteal artery, angioplasty left above and below knee popliteal artery for CLI  with tissue loss by Dr. Chestine Spore.  On 01/01/2023 he underwent angiogram with shockwave lithotripsy of left popliteal artery, angioplasty and stent of left popliteal artery behind the knee, left ATA angioplasty, left mid SFA angioplasty with stent 01/01/2023 by Dr. Chestine Spore for CLI and tissue loss.    -*** -continue asa/statin/plavix -pt will f/u in *** with ***.   Doreatha Massed, [facility] [phone]  Clinic MD:   Chestine Spore

## 2023-04-07 ENCOUNTER — Ambulatory Visit (INDEPENDENT_AMBULATORY_CARE_PROVIDER_SITE_OTHER): Payer: No Typology Code available for payment source | Admitting: Physician Assistant

## 2023-04-07 ENCOUNTER — Ambulatory Visit (HOSPITAL_COMMUNITY)
Admission: RE | Admit: 2023-04-07 | Discharge: 2023-04-07 | Disposition: A | Discharge status: Home / Self Care | Payer: No Typology Code available for payment source | Source: Ambulatory Visit | Attending: Vascular Surgery | Admitting: Vascular Surgery

## 2023-04-07 ENCOUNTER — Ambulatory Visit (INDEPENDENT_AMBULATORY_CARE_PROVIDER_SITE_OTHER)
Admission: RE | Admit: 2023-04-07 | Discharge: 2023-04-07 | Disposition: A | Discharge status: Home / Self Care | Payer: No Typology Code available for payment source | Source: Ambulatory Visit | Attending: Vascular Surgery | Admitting: Vascular Surgery

## 2023-04-07 VITALS — BP 152/89 | HR 74 | Temp 98.3°F | Wt 212.0 lb

## 2023-04-07 DIAGNOSIS — I70222 Atherosclerosis of native arteries of extremities with rest pain, left leg: Secondary | ICD-10-CM

## 2023-04-07 DIAGNOSIS — I739 Peripheral vascular disease, unspecified: Secondary | ICD-10-CM

## 2023-04-07 DIAGNOSIS — I70229 Atherosclerosis of native arteries of extremities with rest pain, unspecified extremity: Secondary | ICD-10-CM | POA: Diagnosis not present

## 2023-04-07 LAB — VAS US ABI WITH/WO TBI
Left ABI: 0.73
Right ABI: 1.06

## 2023-04-13 ENCOUNTER — Ambulatory Visit: Payer: Medicaid Other | Admitting: Podiatry

## 2023-04-15 ENCOUNTER — Other Ambulatory Visit (HOSPITAL_COMMUNITY): Payer: Self-pay

## 2023-04-15 ENCOUNTER — Other Ambulatory Visit: Payer: Self-pay | Admitting: Nurse Practitioner

## 2023-04-15 ENCOUNTER — Other Ambulatory Visit: Payer: Self-pay

## 2023-04-15 DIAGNOSIS — E118 Type 2 diabetes mellitus with unspecified complications: Secondary | ICD-10-CM

## 2023-04-15 MED ORDER — OMEGA-3-ACID ETHYL ESTERS 1 G PO CAPS
1.0000 g | ORAL_CAPSULE | Freq: Two times a day (BID) | ORAL | 2 refills | Status: DC
  Filled 2023-04-15: qty 60, 30d supply, fill #0
  Filled 2023-05-01 – 2023-05-18 (×3): qty 60, 30d supply, fill #1

## 2023-04-21 ENCOUNTER — Other Ambulatory Visit: Payer: Self-pay

## 2023-04-21 DIAGNOSIS — I739 Peripheral vascular disease, unspecified: Secondary | ICD-10-CM

## 2023-05-01 ENCOUNTER — Other Ambulatory Visit: Payer: Self-pay | Admitting: Nurse Practitioner

## 2023-05-01 ENCOUNTER — Other Ambulatory Visit (HOSPITAL_COMMUNITY): Payer: Self-pay

## 2023-05-01 DIAGNOSIS — E118 Type 2 diabetes mellitus with unspecified complications: Secondary | ICD-10-CM

## 2023-05-02 ENCOUNTER — Other Ambulatory Visit (HOSPITAL_COMMUNITY): Payer: Self-pay

## 2023-05-04 ENCOUNTER — Other Ambulatory Visit (HOSPITAL_COMMUNITY): Payer: Self-pay

## 2023-05-04 ENCOUNTER — Other Ambulatory Visit: Payer: Self-pay

## 2023-05-04 MED ORDER — BLOOD GLUCOSE TEST VI STRP
1.0000 | ORAL_STRIP | Freq: Three times a day (TID) | 0 refills | Status: DC
  Filled 2023-05-04: qty 100, 34d supply, fill #0

## 2023-05-04 MED ORDER — NICOTINE 21 MG/24HR TD PT24
21.0000 mg | MEDICATED_PATCH | Freq: Every day | TRANSDERMAL | 0 refills | Status: DC
  Filled 2023-05-04: qty 28, 28d supply, fill #0

## 2023-05-04 MED ORDER — ACETAMINOPHEN 500 MG PO TABS
1000.0000 mg | ORAL_TABLET | Freq: Four times a day (QID) | ORAL | 2 refills | Status: DC | PRN
  Filled 2023-05-04 – 2023-07-19 (×6): qty 30, 4d supply, fill #0
  Filled 2023-07-22 – 2023-07-30 (×2): qty 30, 4d supply, fill #1
  Filled 2023-08-02: qty 30, 4d supply, fill #2

## 2023-05-04 MED ORDER — ATORVASTATIN CALCIUM 40 MG PO TABS
40.0000 mg | ORAL_TABLET | Freq: Every day | ORAL | 1 refills | Status: DC
  Filled 2023-05-04 – 2023-05-12 (×3): qty 90, 90d supply, fill #0

## 2023-05-04 MED ORDER — ACCU-CHEK SOFTCLIX LANCETS MISC
1.0000 | Freq: Three times a day (TID) | 0 refills | Status: DC
  Filled 2023-05-04: qty 100, 34d supply, fill #0

## 2023-05-04 MED ORDER — ACCU-CHEK GUIDE W/DEVICE KIT
1.0000 | PACK | Freq: Three times a day (TID) | 0 refills | Status: DC
  Filled 2023-05-04 – 2023-07-01 (×9): qty 1, 30d supply, fill #0

## 2023-05-04 MED ORDER — ACETAMINOPHEN 325 MG PO TABS
650.0000 mg | ORAL_TABLET | Freq: Four times a day (QID) | ORAL | 2 refills | Status: DC | PRN
  Filled 2023-05-04: qty 90, 12d supply, fill #0
  Filled 2023-07-07 – 2023-08-19 (×3): qty 90, 12d supply, fill #1
  Filled 2023-09-29: qty 90, 12d supply, fill #2

## 2023-05-05 ENCOUNTER — Telehealth: Payer: Self-pay | Admitting: Nurse Practitioner

## 2023-05-05 NOTE — Telephone Encounter (Signed)
Latina from [facility] (very hard to understand on vm) left VM on nurse line 05/01/2023 at 3:47pm  Prescription Form: Section 1 and 2 need to be completed with diagnosis   Please complete these sections and re-fax form to [phone]  Phone: [phone]

## 2023-05-06 ENCOUNTER — Other Ambulatory Visit (HOSPITAL_COMMUNITY): Payer: Self-pay

## 2023-05-06 ENCOUNTER — Other Ambulatory Visit: Payer: Self-pay

## 2023-05-07 ENCOUNTER — Other Ambulatory Visit: Payer: Self-pay

## 2023-05-08 ENCOUNTER — Other Ambulatory Visit: Payer: Self-pay

## 2023-05-11 ENCOUNTER — Other Ambulatory Visit (HOSPITAL_COMMUNITY): Payer: Self-pay

## 2023-05-11 ENCOUNTER — Other Ambulatory Visit: Payer: Self-pay

## 2023-05-11 ENCOUNTER — Other Ambulatory Visit: Payer: Self-pay | Admitting: Nurse Practitioner

## 2023-05-11 DIAGNOSIS — E118 Type 2 diabetes mellitus with unspecified complications: Secondary | ICD-10-CM

## 2023-05-11 NOTE — Telephone Encounter (Signed)
Form was placed in folder. KH

## 2023-05-12 ENCOUNTER — Other Ambulatory Visit (HOSPITAL_COMMUNITY): Payer: Self-pay

## 2023-05-12 ENCOUNTER — Other Ambulatory Visit: Payer: Self-pay

## 2023-05-12 ENCOUNTER — Encounter (HOSPITAL_COMMUNITY): Payer: Self-pay

## 2023-05-13 ENCOUNTER — Other Ambulatory Visit: Payer: Self-pay

## 2023-05-14 ENCOUNTER — Other Ambulatory Visit (HOSPITAL_COMMUNITY): Payer: Self-pay

## 2023-05-15 ENCOUNTER — Other Ambulatory Visit: Payer: Self-pay

## 2023-05-18 ENCOUNTER — Other Ambulatory Visit (HOSPITAL_COMMUNITY): Payer: Self-pay

## 2023-05-18 ENCOUNTER — Other Ambulatory Visit: Payer: Self-pay

## 2023-05-19 ENCOUNTER — Telehealth: Payer: Self-pay

## 2023-05-19 ENCOUNTER — Other Ambulatory Visit: Payer: Self-pay

## 2023-05-19 NOTE — Telephone Encounter (Signed)
Pt was called to ask if he is switching pharmacy [facility]. No answer lvm.  They can be reached [phone].

## 2023-05-21 ENCOUNTER — Other Ambulatory Visit: Payer: Self-pay

## 2023-05-21 DIAGNOSIS — K219 Gastro-esophageal reflux disease without esophagitis: Secondary | ICD-10-CM

## 2023-05-21 DIAGNOSIS — G621 Alcoholic polyneuropathy: Secondary | ICD-10-CM

## 2023-05-21 DIAGNOSIS — E118 Type 2 diabetes mellitus with unspecified complications: Secondary | ICD-10-CM

## 2023-05-21 MED ORDER — OMEGA-3-ACID ETHYL ESTERS 1 G PO CAPS: 1.0000 g | ORAL_CAPSULE | Freq: Two times a day (BID) | ORAL | 2 refills | Status: DC

## 2023-05-21 MED ORDER — CITALOPRAM HYDROBROMIDE 20 MG PO TABS: 20.0000 mg | ORAL_TABLET | Freq: Every day | ORAL | 3 refills | Status: DC

## 2023-05-21 MED ORDER — GABAPENTIN 300 MG PO CAPS: 300.0000 mg | ORAL_CAPSULE | Freq: Two times a day (BID) | ORAL | 1 refills | Status: DC

## 2023-05-21 MED ORDER — ATORVASTATIN CALCIUM 40 MG PO TABS: 40.0000 mg | ORAL_TABLET | Freq: Every day | ORAL | 1 refills | Status: DC

## 2023-05-21 MED ORDER — PANTOPRAZOLE SODIUM 40 MG PO TBEC: 40.0000 mg | DELAYED_RELEASE_TABLET | Freq: Every day | ORAL | 1 refills | Status: DC

## 2023-05-21 MED ORDER — METFORMIN HCL 500 MG PO TABS: 500.0000 mg | ORAL_TABLET | Freq: Every day | ORAL | 1 refills | Status: DC

## 2023-05-21 NOTE — Telephone Encounter (Signed)
Please advise KH 

## 2023-05-22 ENCOUNTER — Other Ambulatory Visit (HOSPITAL_COMMUNITY): Payer: Self-pay

## 2023-05-26 DIAGNOSIS — I872 Venous insufficiency (chronic) (peripheral): Secondary | ICD-10-CM | POA: Diagnosis not present

## 2023-05-26 DIAGNOSIS — E119 Type 2 diabetes mellitus without complications: Secondary | ICD-10-CM | POA: Diagnosis not present

## 2023-05-26 DIAGNOSIS — L84 Corns and callosities: Secondary | ICD-10-CM | POA: Diagnosis not present

## 2023-05-27 ENCOUNTER — Other Ambulatory Visit (HOSPITAL_COMMUNITY): Payer: Self-pay

## 2023-06-06 ENCOUNTER — Other Ambulatory Visit (HOSPITAL_COMMUNITY): Payer: Self-pay

## 2023-06-08 ENCOUNTER — Other Ambulatory Visit: Payer: Self-pay

## 2023-06-10 ENCOUNTER — Other Ambulatory Visit: Payer: Self-pay

## 2023-06-15 ENCOUNTER — Other Ambulatory Visit: Payer: Self-pay | Admitting: Nurse Practitioner

## 2023-06-15 ENCOUNTER — Other Ambulatory Visit: Payer: Self-pay

## 2023-06-15 ENCOUNTER — Other Ambulatory Visit (HOSPITAL_COMMUNITY): Payer: Self-pay

## 2023-06-15 ENCOUNTER — Telehealth: Payer: Self-pay

## 2023-06-15 DIAGNOSIS — E118 Type 2 diabetes mellitus with unspecified complications: Secondary | ICD-10-CM

## 2023-06-15 DIAGNOSIS — D509 Iron deficiency anemia, unspecified: Secondary | ICD-10-CM

## 2023-06-15 MED ORDER — CLOPIDOGREL BISULFATE 75 MG PO TABS: 75.0000 mg | ORAL_TABLET | Freq: Every day | ORAL | 3 refills | Status: DC

## 2023-06-15 NOTE — Telephone Encounter (Signed)
Copied from CRM [id number]. Topic: Clinical - Medication Refill >> Jun 15, 2023 10:24 AM Shelbie Proctor wrote: Most Recent Primary Care Visit:  Provider: [name]  Department: [facility]  Visit Type: OFFICE VISIT  Date: 12/22/2022  Medication: [facility] pharmacy direct c/b # [phone] Fax [phone]. Needs clarification on missing rx, Clopidogrel 75 mg is pt still taking med, if so pls send rx refill to pharmacy.  Has the patient contacted their pharmacy?  (Agent: If no, request that the patient contact the pharmacy for the refill. If patient does not wish to contact the pharmacy document the reason why and proceed with request.) (Agent: If yes, when and what did the pharmacy advise?)  Is this the correct pharmacy for this prescription?  If no, delete pharmacy and type the correct one.  This is the patient's preferred pharmacy:   [facility] [place] [address] [place] [address] Phone: [phone] Fax: [phone]   Has the prescription been filled recently?   Is the patient out of the medication?   Has the patient been seen for an appointment in the last year OR does the patient have an upcoming appointment?   Can we respond through MyChart?   Agent: Please be advised that Rx refills may take up to 3 business days. We ask that you follow-up with your pharmacy.  Called back no answer. Lvm for pharmacy to call back. KH

## 2023-06-19 ENCOUNTER — Other Ambulatory Visit (HOSPITAL_COMMUNITY): Payer: Self-pay

## 2023-06-19 ENCOUNTER — Other Ambulatory Visit: Payer: Self-pay | Admitting: Nurse Practitioner

## 2023-06-19 DIAGNOSIS — E118 Type 2 diabetes mellitus with unspecified complications: Secondary | ICD-10-CM

## 2023-06-19 DIAGNOSIS — D509 Iron deficiency anemia, unspecified: Secondary | ICD-10-CM

## 2023-06-19 MED ORDER — BLOOD GLUCOSE TEST VI STRP
1.0000 | ORAL_STRIP | Freq: Three times a day (TID) | 0 refills | Status: DC
  Filled 2023-06-19: qty 100, 34d supply, fill #0

## 2023-06-20 ENCOUNTER — Other Ambulatory Visit (HOSPITAL_COMMUNITY): Payer: Self-pay

## 2023-06-20 ENCOUNTER — Other Ambulatory Visit: Payer: Self-pay | Admitting: Nurse Practitioner

## 2023-06-20 DIAGNOSIS — E118 Type 2 diabetes mellitus with unspecified complications: Secondary | ICD-10-CM

## 2023-06-20 DIAGNOSIS — D509 Iron deficiency anemia, unspecified: Secondary | ICD-10-CM

## 2023-06-22 ENCOUNTER — Other Ambulatory Visit: Payer: Self-pay | Admitting: Nurse Practitioner

## 2023-06-22 ENCOUNTER — Other Ambulatory Visit: Payer: Self-pay

## 2023-06-22 DIAGNOSIS — E118 Type 2 diabetes mellitus with unspecified complications: Secondary | ICD-10-CM

## 2023-06-22 MED ORDER — FERROUS SULFATE 325 (65 FE) MG PO TABS
325.0000 mg | ORAL_TABLET | Freq: Every day | ORAL | 0 refills | Status: DC
  Filled 2023-06-22: qty 30, 30d supply, fill #0
  Filled 2023-07-02 – 2023-07-17 (×2): qty 30, 30d supply, fill #1
  Filled 2023-07-19 – 2023-08-19 (×8): qty 30, 30d supply, fill #2
  Filled 2023-08-25 – 2023-09-13 (×3): qty 30, 30d supply, fill #3

## 2023-06-22 MED ORDER — ACCU-CHEK SOFTCLIX LANCETS MISC
1.0000 | Freq: Three times a day (TID) | 0 refills | Status: DC
  Filled 2023-06-22: qty 100, 34d supply, fill #0

## 2023-06-23 ENCOUNTER — Other Ambulatory Visit: Payer: Self-pay

## 2023-06-23 ENCOUNTER — Telehealth: Payer: Self-pay | Admitting: Nurse Practitioner

## 2023-06-23 MED ORDER — BLOOD GLUCOSE TEST VI STRP
1.0000 | ORAL_STRIP | Freq: Three times a day (TID) | 0 refills | Status: DC
  Filled 2023-06-23 – 2023-08-06 (×5): qty 100, 34d supply, fill #0

## 2023-06-23 NOTE — Telephone Encounter (Signed)
Record request printed and put on Kim's desk

## 2023-06-23 NOTE — Telephone Encounter (Signed)
Copied from CRM [id number]. Topic: Medical Record Request - Provider/Facility Request >> Jun 23, 2023 10:40 AM Almira Coaster wrote: Reason for CRM: Angie from [facility] called to follow up on a Medical records request that was faxed on November 26th and again this morning December 17th. Their call back number is [phone].

## 2023-06-24 ENCOUNTER — Encounter: Payer: Self-pay | Admitting: Nurse Practitioner

## 2023-06-24 ENCOUNTER — Other Ambulatory Visit: Payer: Self-pay

## 2023-06-24 ENCOUNTER — Other Ambulatory Visit (HOSPITAL_COMMUNITY): Payer: Self-pay

## 2023-06-24 ENCOUNTER — Ambulatory Visit (INDEPENDENT_AMBULATORY_CARE_PROVIDER_SITE_OTHER): Payer: No Typology Code available for payment source | Admitting: Nurse Practitioner

## 2023-06-24 VITALS — BP 147/89 | HR 72 | Temp 98.2°F | Wt 225.6 lb

## 2023-06-24 DIAGNOSIS — E118 Type 2 diabetes mellitus with unspecified complications: Secondary | ICD-10-CM

## 2023-06-24 DIAGNOSIS — Z720 Tobacco use: Secondary | ICD-10-CM | POA: Diagnosis not present

## 2023-06-24 DIAGNOSIS — R32 Unspecified urinary incontinence: Secondary | ICD-10-CM | POA: Diagnosis not present

## 2023-06-24 DIAGNOSIS — Z23 Encounter for immunization: Secondary | ICD-10-CM

## 2023-06-24 MED ORDER — NICOTINE POLACRILEX 4 MG MT GUM
4.0000 mg | CHEWING_GUM | OROMUCOSAL | 0 refills | Status: DC | PRN
  Filled 2023-06-24: qty 100, fill #0
  Filled 2023-07-02: qty 100, 30d supply, fill #0
  Filled 2023-07-14: qty 100, 20d supply, fill #0
  Filled 2023-07-17 – 2023-07-19 (×2): qty 100, 10d supply, fill #0
  Filled 2023-07-24 – 2023-07-28 (×3): qty 100, 30d supply, fill #0

## 2023-06-24 NOTE — Patient Instructions (Addendum)
1. Declined smoking cessation (Primary)  - nicotine polacrilex (NICORETTE) 4 MG gum; Take 1 each (4 mg total) by mouth as needed for smoking cessation.  Dispense: 100 tablet; Refill: 0   2. Need for influenza vaccination  - Flu vaccine trivalent PF, 6mos and older(Flulaval,Afluria,Fluarix,Fluzone)   Follow up:  Follow up in 6 months

## 2023-06-24 NOTE — Progress Notes (Addendum)
Subjective   Patient ID: Tony Hicks, male    DOB: 06/13/1966, 57 y.o.   MRN: [id number]  Chief Complaint  Patient presents with   Diabetes   Hyperlipidemia    Referring provider: [name], NP  Tony Hicks is a 57 y.o. male with Past Medical History: No date: Diabetes mellitus without complication (HCC)     Comment:  type 2 No date: Family history of adverse reaction to anesthesia No date: HLD (hyperlipidemia) No date: Syphilis     Comment:  Treated  02/14/2019   HPI  Diabetes Mellitus: Patient presents for follow up of diabetes. Symptoms: hyperglycemia and paresthesia of the feet. Patient denies none.  Evaluation to date has been included: hemoglobin A1C.  Home sugars: patient does not check sugars. Treatment to date: no recent interventions. A1C today 5.9.   Denies any other concerns today. Denies any fatigue, chest pain, shortness of breath, HA or dizziness. Denies any blurred vision, numbness or tingling.  Note: Patient does have intermittent urinary incontinence / inability to hold his bladder  No Known Allergies  Immunization History  Administered Date(s) Administered   Influenza, Seasonal, Injecte, Preservative Fre 06/24/2023   Influenza,inj,Quad PF,6+ Mos 03/14/2021   Moderna Sars-Covid-2 Vaccination 05/14/2020, 07/17/2020   Pneumococcal Polysaccharide-23 03/14/2021    Tobacco History: Social History   Tobacco Use  Smoking Status Every Day   Current packs/day: 0.50   Types: Cigarettes   Passive exposure: Never  Smokeless Tobacco Never  Tobacco Comments   Patch is helping quit-2-5 a week-Been smoking since age 59 yrs old.5/24   Ready to quit: Yes Counseling given: Yes Tobacco comments: Patch is helping quit-2-5 a week-Been smoking since age 11 yrs old.5/24   Outpatient Encounter Medications as of 06/24/2023  Medication Sig   Accu-Chek Softclix Lancets lancets Use to check blood glucose in the morning, at noon, and at bedtime.    acetaminophen (TYLENOL) 325 MG tablet Take 2 tablets (650 mg total) by mouth every 6 hours as needed for mild pain or moderate pain.   acetaminophen (TYLENOL) 500 MG tablet Take 2 tablets (1,000 mg total) by mouth every 6 hours as needed for moderate pain.   aspirin EC 81 MG tablet Take 1 tablet (81 mg total) by mouth daily. Swallow whole.   atorvastatin (LIPITOR) 40 MG tablet Take 1 tablet (40 mg total) by mouth daily.   Blood Glucose Monitoring Suppl (ACCU-CHEK GUIDE) w/Device KIT Use to test 3 times a day in the morning, at noon, and at bedtime.   citalopram (CELEXA) 20 MG tablet Take 1 tablet (20 mg total) by mouth daily.   clopidogrel (PLAVIX) 75 MG tablet Take 1 tablet (75 mg total) by mouth daily.   ferrous sulfate (FEROSUL) 325 (65 FE) MG tablet Take 1 tablet (325 mg) by mouth daily with breakfast.   gabapentin (NEURONTIN) 300 MG capsule Take 1 capsule (300 mg total) by mouth 2 (two) times daily.   Glucose Blood (BLOOD GLUCOSE TEST STRIPS) STRP Use as directed in the morning, at noon, and at bedtime.   metFORMIN (GLUCOPHAGE) 500 MG tablet Take 1 tablet (500 mg total) by mouth daily with breakfast.   nicotine (NICODERM CQ) 21 mg/24hr patch Place 1 patch (21 mg total) onto the skin daily.   nicotine polacrilex (NICORETTE) 4 MG gum Take 1 each (4 mg total) by mouth as needed for smoking cessation.   omega-3 acid ethyl esters (LOVAZA) 1 g capsule Take 1 capsule (1 g total) by mouth  2 (two) times daily. (Patient not taking: Reported on 06/24/2023)   pantoprazole (PROTONIX) 40 MG tablet Take 1 tablet (40 mg total) by mouth daily. (Patient not taking: Reported on 06/24/2023)   No facility-administered encounter medications on file as of 06/24/2023.    Review of Systems  Review of Systems  Constitutional: Negative.   HENT: Negative.    Cardiovascular: Negative.   Gastrointestinal: Negative.   Allergic/Immunologic: Negative.   Neurological: Negative.   Psychiatric/Behavioral: Negative.        Objective:   BP (!) 147/89   Pulse 72   Temp 98.2 F (36.8 C)   Wt 225 lb 9.6 oz (102.3 kg)   SpO2 98%   BMI 36.41 kg/m   Wt Readings from Last 5 Encounters:  06/24/23 225 lb 9.6 oz (102.3 kg)  04/07/23 212 lb (96.2 kg)  01/01/23 200 lb (90.7 kg)  12/23/22 205 lb (93 kg)  12/22/22 201 lb (91.2 kg)     Physical Exam Vitals and nursing note reviewed.  Constitutional:      General: He is not in acute distress.    Appearance: He is well-developed.  Cardiovascular:     Rate and Rhythm: Normal rate and regular rhythm.  Pulmonary:     Effort: Pulmonary effort is normal.     Breath sounds: Normal breath sounds.  Skin:    General: Skin is warm and dry.  Neurological:     Mental Status: He is alert and oriented to person, place, and time.       Assessment & Plan:   Declined smoking cessation -     Nicotine Polacrilex; Take 1 each (4 mg total) by mouth as needed for smoking cessation.  Dispense: 100 tablet; Refill: 0  Need for influenza vaccination -     Flu vaccine trivalent PF, 6mos and older(Flulaval,Afluria,Fluarix,Fluzone)  Controlled type 2 diabetes mellitus with complication, without long-term current use of insulin (HCC) -     CBC -     Comprehensive metabolic panel -     POCT glycosylated hemoglobin (Hb A1C)     Return in about 6 months (around 12/23/2023).     [name], NP 06/25/2023

## 2023-06-25 LAB — COMPREHENSIVE METABOLIC PANEL
ALT: 22 [IU]/L (ref 0–44)
AST: 19 [IU]/L (ref 0–40)
Albumin: 4.6 g/dL (ref 3.8–4.9)
Alkaline Phosphatase: 77 [IU]/L (ref 44–121)
BUN/Creatinine Ratio: 17 (ref 9–20)
BUN: 15 mg/dL (ref 6–24)
Bilirubin Total: 0.3 mg/dL (ref 0.0–1.2)
CO2: 22 mmol/L (ref 20–29)
Calcium: 9.7 mg/dL (ref 8.7–10.2)
Chloride: 99 mmol/L (ref 96–106)
Creatinine, Ser: 0.86 mg/dL (ref 0.76–1.27)
Globulin, Total: 2.6 g/dL (ref 1.5–4.5)
Glucose: 90 mg/dL (ref 70–99)
Potassium: 4.1 mmol/L (ref 3.5–5.2)
Sodium: 139 mmol/L (ref 134–144)
Total Protein: 7.2 g/dL (ref 6.0–8.5)
eGFR: 102 mL/min/{1.73_m2} (ref >59)

## 2023-06-25 LAB — CBC
Hematocrit: 41.6 % (ref 37.5–51.0)
Hemoglobin: 14.2 g/dL (ref 13.0–17.7)
MCH: 31.6 pg (ref 26.6–33.0)
MCHC: 34.1 g/dL (ref 31.5–35.7)
MCV: 93 fL (ref 79–97)
Platelets: 187 10*3/uL (ref 150–450)
RBC: 4.49 x10E6/uL (ref 4.14–5.80)
RDW: 11.2 % — ABNORMAL LOW (ref 11.6–15.4)
WBC: 4.1 10*3/uL (ref 3.4–10.8)

## 2023-06-25 LAB — POCT GLYCOSYLATED HEMOGLOBIN (HGB A1C): Hemoglobin A1C: 5.9 % — AB (ref 4.0–5.6)

## 2023-07-01 ENCOUNTER — Other Ambulatory Visit: Payer: Self-pay | Admitting: Nurse Practitioner

## 2023-07-01 DIAGNOSIS — E118 Type 2 diabetes mellitus with unspecified complications: Secondary | ICD-10-CM

## 2023-07-02 ENCOUNTER — Other Ambulatory Visit: Payer: Self-pay | Admitting: Nurse Practitioner

## 2023-07-02 ENCOUNTER — Other Ambulatory Visit: Payer: Self-pay

## 2023-07-02 ENCOUNTER — Other Ambulatory Visit (HOSPITAL_COMMUNITY): Payer: Self-pay

## 2023-07-02 DIAGNOSIS — E118 Type 2 diabetes mellitus with unspecified complications: Secondary | ICD-10-CM

## 2023-07-02 MED ORDER — ACCU-CHEK SOFTCLIX LANCETS MISC
1.0000 | Freq: Three times a day (TID) | 0 refills | Status: DC
  Filled 2023-07-02 – 2023-08-06 (×4): qty 100, 34d supply, fill #0

## 2023-07-03 ENCOUNTER — Other Ambulatory Visit: Payer: Self-pay

## 2023-07-03 ENCOUNTER — Other Ambulatory Visit (HOSPITAL_COMMUNITY): Payer: Self-pay

## 2023-07-03 MED ORDER — ONETOUCH ULTRA 2 W/DEVICE KIT
1.0000 | PACK | Freq: Three times a day (TID) | 0 refills | Status: DC
  Filled 2023-07-03 – 2023-09-07 (×8): qty 1, 30d supply, fill #0

## 2023-07-07 ENCOUNTER — Other Ambulatory Visit (HOSPITAL_COMMUNITY): Payer: Self-pay

## 2023-07-07 ENCOUNTER — Other Ambulatory Visit: Payer: Self-pay | Admitting: Nurse Practitioner

## 2023-07-07 ENCOUNTER — Other Ambulatory Visit: Payer: Self-pay

## 2023-07-09 ENCOUNTER — Other Ambulatory Visit: Payer: Self-pay

## 2023-07-09 MED ORDER — NICOTINE 21 MG/24HR TD PT24
21.0000 mg | MEDICATED_PATCH | Freq: Every day | TRANSDERMAL | 0 refills | Status: DC
  Filled 2023-07-09: qty 28, 28d supply, fill #0

## 2023-07-14 ENCOUNTER — Other Ambulatory Visit: Payer: Self-pay

## 2023-07-14 ENCOUNTER — Encounter (HOSPITAL_COMMUNITY): Payer: No Typology Code available for payment source

## 2023-07-14 ENCOUNTER — Other Ambulatory Visit (HOSPITAL_COMMUNITY): Payer: Self-pay

## 2023-07-14 ENCOUNTER — Ambulatory Visit: Payer: No Typology Code available for payment source | Admitting: Vascular Surgery

## 2023-07-17 ENCOUNTER — Other Ambulatory Visit: Payer: Self-pay

## 2023-07-17 ENCOUNTER — Other Ambulatory Visit (HOSPITAL_COMMUNITY): Payer: Self-pay

## 2023-07-20 ENCOUNTER — Other Ambulatory Visit: Payer: Self-pay

## 2023-07-20 ENCOUNTER — Ambulatory Visit: Payer: No Typology Code available for payment source | Admitting: Podiatry

## 2023-07-20 ENCOUNTER — Other Ambulatory Visit (HOSPITAL_COMMUNITY): Payer: Self-pay

## 2023-07-22 ENCOUNTER — Other Ambulatory Visit: Payer: Self-pay

## 2023-07-23 ENCOUNTER — Other Ambulatory Visit (HOSPITAL_COMMUNITY): Payer: Self-pay

## 2023-07-24 ENCOUNTER — Other Ambulatory Visit: Payer: Self-pay

## 2023-07-24 ENCOUNTER — Other Ambulatory Visit: Payer: Self-pay | Admitting: Nurse Practitioner

## 2023-07-24 NOTE — Telephone Encounter (Signed)
Please advise KH 

## 2023-07-25 ENCOUNTER — Other Ambulatory Visit (HOSPITAL_COMMUNITY): Payer: Self-pay

## 2023-07-27 ENCOUNTER — Other Ambulatory Visit: Payer: Self-pay

## 2023-07-27 ENCOUNTER — Other Ambulatory Visit (HOSPITAL_COMMUNITY): Payer: Self-pay

## 2023-07-27 ENCOUNTER — Other Ambulatory Visit: Payer: Self-pay | Admitting: Nurse Practitioner

## 2023-07-27 MED ORDER — NICOTINE 21 MG/24HR TD PT24
21.0000 mg | MEDICATED_PATCH | Freq: Every day | TRANSDERMAL | 0 refills | Status: DC
  Filled 2023-07-27 – 2023-08-02 (×2): qty 28, 28d supply, fill #0

## 2023-07-28 ENCOUNTER — Ambulatory Visit (HOSPITAL_COMMUNITY): Payer: No Typology Code available for payment source

## 2023-07-28 ENCOUNTER — Ambulatory Visit: Payer: No Typology Code available for payment source | Admitting: Vascular Surgery

## 2023-07-29 ENCOUNTER — Other Ambulatory Visit: Payer: Self-pay

## 2023-07-29 ENCOUNTER — Other Ambulatory Visit: Payer: Self-pay | Admitting: Nurse Practitioner

## 2023-07-29 DIAGNOSIS — Z720 Tobacco use: Secondary | ICD-10-CM

## 2023-07-29 NOTE — Telephone Encounter (Signed)
Please advise KH 

## 2023-07-30 ENCOUNTER — Other Ambulatory Visit: Payer: Self-pay

## 2023-07-30 ENCOUNTER — Other Ambulatory Visit (HOSPITAL_COMMUNITY): Payer: Self-pay

## 2023-07-30 MED ORDER — NICOTINE POLACRILEX 4 MG MT GUM
4.0000 mg | CHEWING_GUM | OROMUCOSAL | 0 refills | Status: DC | PRN
  Filled 2023-07-30 – 2023-08-06 (×4): qty 100, 30d supply, fill #0
  Filled 2023-08-08: qty 100, 15d supply, fill #0
  Filled 2023-08-19 – 2023-10-14 (×4): qty 100, 30d supply, fill #0

## 2023-07-31 ENCOUNTER — Other Ambulatory Visit: Payer: Self-pay

## 2023-07-31 ENCOUNTER — Encounter: Payer: Self-pay | Admitting: Pharmacist

## 2023-08-02 ENCOUNTER — Other Ambulatory Visit (HOSPITAL_COMMUNITY): Payer: Self-pay

## 2023-08-03 ENCOUNTER — Other Ambulatory Visit: Payer: Self-pay

## 2023-08-05 ENCOUNTER — Other Ambulatory Visit: Payer: Self-pay

## 2023-08-06 ENCOUNTER — Other Ambulatory Visit (HOSPITAL_COMMUNITY): Payer: Self-pay

## 2023-08-06 ENCOUNTER — Other Ambulatory Visit: Payer: Self-pay | Admitting: Nurse Practitioner

## 2023-08-06 ENCOUNTER — Other Ambulatory Visit: Payer: Self-pay

## 2023-08-07 ENCOUNTER — Other Ambulatory Visit (HOSPITAL_COMMUNITY): Payer: Self-pay

## 2023-08-07 ENCOUNTER — Other Ambulatory Visit: Payer: Self-pay

## 2023-08-07 MED ORDER — ACETAMINOPHEN 500 MG PO TABS
1000.0000 mg | ORAL_TABLET | Freq: Four times a day (QID) | ORAL | 2 refills | Status: DC | PRN
  Filled 2023-08-07: qty 30, 4d supply, fill #0
  Filled 2023-08-08 – 2023-09-07 (×4): qty 30, 4d supply, fill #1
  Filled 2023-09-29 (×2): qty 30, 4d supply, fill #2

## 2023-08-08 ENCOUNTER — Other Ambulatory Visit: Payer: Self-pay | Admitting: Nurse Practitioner

## 2023-08-08 DIAGNOSIS — E118 Type 2 diabetes mellitus with unspecified complications: Secondary | ICD-10-CM

## 2023-08-10 ENCOUNTER — Other Ambulatory Visit (HOSPITAL_COMMUNITY): Payer: Self-pay

## 2023-08-10 ENCOUNTER — Other Ambulatory Visit: Payer: Self-pay

## 2023-08-10 MED ORDER — ONETOUCH DELICA PLUS LANCET33G MISC
1.0000 | Freq: Three times a day (TID) | 0 refills | Status: DC
  Filled 2023-08-10 – 2023-08-25 (×3): qty 100, 34d supply, fill #0
  Filled 2023-09-07: qty 100, 33d supply, fill #0

## 2023-08-10 MED ORDER — BLOOD GLUCOSE TEST VI STRP
1.0000 | ORAL_STRIP | Freq: Three times a day (TID) | 0 refills | Status: DC
  Filled 2023-08-10 – 2023-09-07 (×4): qty 100, 34d supply, fill #0

## 2023-08-11 ENCOUNTER — Other Ambulatory Visit (HOSPITAL_COMMUNITY): Payer: Self-pay

## 2023-08-11 ENCOUNTER — Other Ambulatory Visit: Payer: Self-pay

## 2023-08-12 ENCOUNTER — Telehealth: Payer: Self-pay | Admitting: Nurse Practitioner

## 2023-08-12 NOTE — Telephone Encounter (Signed)
 Copied from CRM [id number]. Topic: Clinical - Prescription Issue >> Aug 12, 2023  2:21 PM Lennie Ra H wrote: Reason for CRM: Requesting a prescription request for omnipod 5

## 2023-08-13 ENCOUNTER — Other Ambulatory Visit: Payer: Self-pay

## 2023-08-13 ENCOUNTER — Other Ambulatory Visit (HOSPITAL_COMMUNITY): Payer: Self-pay

## 2023-08-14 ENCOUNTER — Telehealth: Payer: Self-pay

## 2023-08-14 ENCOUNTER — Other Ambulatory Visit (HOSPITAL_COMMUNITY): Payer: Self-pay

## 2023-08-14 ENCOUNTER — Other Ambulatory Visit: Payer: Self-pay

## 2023-08-14 NOTE — Telephone Encounter (Signed)
 Copied from CRM [id number]. Topic: Clinical - Medication Question >> Aug 14, 2023 12:24 PM Deleta HERO wrote: Reason for CRM: Beverley from [employer] wanted to confirm if the prior authorization for the Omnipod 5 has been signed by his PCP and faxed over to them. Advised it was completed on 08/04/2023 and confirmed the fax number. Beverley states the prior authorization has still not been received and he is wanting to know if this can be re-faxed?

## 2023-08-14 NOTE — Telephone Encounter (Signed)
 Copied from CRM [id number]. Topic: General - Other >> Aug 14, 2023 12:34 PM Susanna ORN wrote: Reason for CRM: Tony Hicks, with [facility]([employer]) called stating that he just spoke with another agent who told him that the [place] form for the Omnipod was sent on Feb. 5th. Checked and advised that the form was signed by pcp & faxed back to [phone]. He states they still haven't received it. He stated he was told by last agent that it would be re-faxed so they will be on the look out for it.

## 2023-08-18 ENCOUNTER — Telehealth: Payer: Self-pay

## 2023-08-18 NOTE — Telephone Encounter (Unsigned)
Copied from CRM [id number]. Topic: General - Other >> Aug 18, 2023  2:33 PM Suzette B wrote: Reason for CRM: Ms. Matthew Saras [facility]([employer]) called in regards to a prior authorization which was sent via fax on 02/05 Please refer to CRM [id number], please call Ernestine at [phone]

## 2023-08-19 ENCOUNTER — Other Ambulatory Visit: Payer: Self-pay

## 2023-08-20 ENCOUNTER — Other Ambulatory Visit: Payer: Self-pay

## 2023-08-20 ENCOUNTER — Telehealth: Payer: Self-pay | Admitting: Nurse Practitioner

## 2023-08-20 NOTE — Telephone Encounter (Signed)
Copied from CRM [id number]. Topic: General - Other >> Aug 20, 2023 12:10 PM Antwanette L wrote: Reason for CRM: Koby from [employer] was calling to get an update on a  prior authorization for a ominpod 5 dexcom g7ge intro kit (gen 5). Anne Ng can be reached at [phone]

## 2023-08-26 ENCOUNTER — Telehealth: Payer: Self-pay

## 2023-08-26 ENCOUNTER — Other Ambulatory Visit (HOSPITAL_COMMUNITY): Payer: Self-pay

## 2023-08-26 ENCOUNTER — Other Ambulatory Visit: Payer: Self-pay

## 2023-08-26 NOTE — Telephone Encounter (Signed)
Are you trying to order the dexcom sensor  to check his blood sugars  or he needs the omni pod to automatically give him insulin? Please advise [facility]

## 2023-08-27 ENCOUNTER — Other Ambulatory Visit: Payer: Self-pay

## 2023-08-27 DIAGNOSIS — E118 Type 2 diabetes mellitus with unspecified complications: Secondary | ICD-10-CM

## 2023-08-27 MED ORDER — DEXCOM G7 SENSOR MISC: 1.0000 | 3 refills | Status: AC | PRN

## 2023-08-27 MED ORDER — DEXCOM G7 RECEIVER DEVI: 1.0000 | 0 refills | Status: DC | PRN

## 2023-08-27 NOTE — Telephone Encounter (Signed)
[facility]. KH

## 2023-08-28 ENCOUNTER — Other Ambulatory Visit: Payer: Self-pay | Admitting: Nurse Practitioner

## 2023-08-28 NOTE — Telephone Encounter (Signed)
 Please advise in [place] absence. KH

## 2023-09-03 ENCOUNTER — Ambulatory Visit: Payer: No Typology Code available for payment source | Admitting: Podiatry

## 2023-09-08 ENCOUNTER — Other Ambulatory Visit (HOSPITAL_COMMUNITY): Payer: Self-pay

## 2023-09-08 ENCOUNTER — Other Ambulatory Visit: Payer: Self-pay

## 2023-09-08 DIAGNOSIS — E118 Type 2 diabetes mellitus with unspecified complications: Secondary | ICD-10-CM

## 2023-09-08 MED ORDER — DEXCOM G7 RECEIVER DEVI
1.0000 | 0 refills | Status: AC | PRN
  Filled 2023-09-08: qty 1, 90d supply, fill #0
  Filled 2023-09-29: qty 1, 30d supply, fill #0
  Filled 2024-05-13 – 2024-07-21 (×5): qty 1, 90d supply, fill #0
  Filled 2024-07-22 – 2024-07-26 (×2): qty 1, fill #0

## 2023-09-09 ENCOUNTER — Ambulatory Visit: Payer: No Typology Code available for payment source | Admitting: Podiatry

## 2023-09-09 ENCOUNTER — Other Ambulatory Visit: Payer: Self-pay

## 2023-09-10 ENCOUNTER — Other Ambulatory Visit: Payer: Self-pay

## 2023-09-13 ENCOUNTER — Other Ambulatory Visit: Payer: Self-pay | Admitting: Nurse Practitioner

## 2023-09-13 DIAGNOSIS — E118 Type 2 diabetes mellitus with unspecified complications: Secondary | ICD-10-CM

## 2023-09-14 ENCOUNTER — Other Ambulatory Visit (HOSPITAL_COMMUNITY): Payer: Self-pay

## 2023-09-14 MED ORDER — ACCU-CHEK GUIDE W/DEVICE KIT
1.0000 | PACK | Freq: Three times a day (TID) | 0 refills | Status: DC
  Filled 2023-09-14 – 2024-04-24 (×40): qty 1, 30d supply, fill #0

## 2023-09-14 MED ORDER — BLOOD GLUCOSE TEST VI STRP
1.0000 | ORAL_STRIP | Freq: Three times a day (TID) | 0 refills | Status: DC
  Filled 2023-09-14 – 2023-10-14 (×5): qty 100, 34d supply, fill #0

## 2023-09-15 ENCOUNTER — Other Ambulatory Visit: Payer: Self-pay

## 2023-09-15 ENCOUNTER — Other Ambulatory Visit (HOSPITAL_COMMUNITY): Payer: Self-pay

## 2023-09-20 ENCOUNTER — Other Ambulatory Visit: Payer: Self-pay | Admitting: Nurse Practitioner

## 2023-09-20 DIAGNOSIS — D509 Iron deficiency anemia, unspecified: Secondary | ICD-10-CM

## 2023-09-21 ENCOUNTER — Other Ambulatory Visit (HOSPITAL_COMMUNITY): Payer: Self-pay

## 2023-09-21 ENCOUNTER — Other Ambulatory Visit: Payer: Self-pay

## 2023-09-21 MED ORDER — FERROUS SULFATE 325 (65 FE) MG PO TABS
325.0000 mg | ORAL_TABLET | Freq: Every day | ORAL | 0 refills | Status: DC
  Filled 2023-09-21 – 2023-09-29 (×2): qty 120, 120d supply, fill #0

## 2023-09-23 ENCOUNTER — Other Ambulatory Visit: Payer: Self-pay

## 2023-09-25 ENCOUNTER — Other Ambulatory Visit: Payer: Self-pay

## 2023-09-25 ENCOUNTER — Other Ambulatory Visit (HOSPITAL_COMMUNITY): Payer: Self-pay

## 2023-09-29 ENCOUNTER — Ambulatory Visit (HOSPITAL_COMMUNITY)
Admission: RE | Admit: 2023-09-29 | Discharge: 2023-09-29 | Disposition: A | Discharge status: Home / Self Care | Payer: No Typology Code available for payment source | Source: Ambulatory Visit | Attending: Vascular Surgery | Admitting: Vascular Surgery

## 2023-09-29 ENCOUNTER — Other Ambulatory Visit: Payer: Self-pay | Admitting: Nurse Practitioner

## 2023-09-29 ENCOUNTER — Other Ambulatory Visit: Payer: Self-pay

## 2023-09-29 ENCOUNTER — Other Ambulatory Visit (HOSPITAL_COMMUNITY): Payer: Self-pay

## 2023-09-29 ENCOUNTER — Ambulatory Visit (INDEPENDENT_AMBULATORY_CARE_PROVIDER_SITE_OTHER): Payer: No Typology Code available for payment source | Admitting: Vascular Surgery

## 2023-09-29 ENCOUNTER — Ambulatory Visit (INDEPENDENT_AMBULATORY_CARE_PROVIDER_SITE_OTHER)
Admission: RE | Admit: 2023-09-29 | Discharge: 2023-09-29 | Disposition: A | Discharge status: Home / Self Care | Payer: No Typology Code available for payment source | Source: Ambulatory Visit | Attending: Vascular Surgery | Admitting: Vascular Surgery

## 2023-09-29 VITALS — BP 178/100 | HR 75 | Temp 97.6°F | Resp 18 | Ht 66.0 in | Wt 236.3 lb

## 2023-09-29 DIAGNOSIS — I70229 Atherosclerosis of native arteries of extremities with rest pain, unspecified extremity: Secondary | ICD-10-CM

## 2023-09-29 DIAGNOSIS — I739 Peripheral vascular disease, unspecified: Secondary | ICD-10-CM | POA: Insufficient documentation

## 2023-09-29 DIAGNOSIS — E118 Type 2 diabetes mellitus with unspecified complications: Secondary | ICD-10-CM

## 2023-09-29 LAB — VAS US ABI WITH/WO TBI
Left ABI: 0.86
Right ABI: 0.96

## 2023-09-29 MED ORDER — ONETOUCH DELICA PLUS LANCET33G MISC
1.0000 | Freq: Three times a day (TID) | 0 refills | Status: DC
  Filled 2023-09-29 – 2023-10-14 (×3): qty 100, 33d supply, fill #0

## 2023-09-29 NOTE — Progress Notes (Signed)
 Patient name: Tony Hicks MRN: [id number] DOB: Sep 28, 1965 Sex: male  REASON FOR CONSULT: 3 month follow-up, critical limb ischemia left lower extremity  HPI: Tony Hicks is a 58 y.o. male, with history of diabetes and tobacco abuse who presents for 3 month follow-up of his PAD.   He initially underwent left above-knee popliteal artery to posterior tibial bypass with great saphenous vein on 01/14/2021 for critical limb ischemia with tissue loss.  His bypass later occluded and he underwent a redo bypass with PTFE from SFA to PT on 10/08/22 and this got infected and had to be excised and replaced with cryo on 10/08/2022.  This subsequently thrombosed several weeks later.  Most recently underwent recanalization of his native system with SFA and above-knee popliteal angioplasty/stenting and AT angioplasty requiring retrograde tibial access of the DP at the ankle on 11/20/2022.    He then underwent additional left leg intervention on 01/01/2023 with shockwave lithotripsy of the popliteal artery behind the knee with popliteal stent using a 6 mm Eluvia.  Today he has no left lower extremity complaints.  States he is taking his aspirin Plavix statin.  Down to smoking 4 cigarettes a day.  His previous left foot tissue loss healed.  No open wounds at this time.   Past Medical History:  Diagnosis Date   Diabetes mellitus without complication (HCC)    type 2   Family history of adverse reaction to anesthesia    HLD (hyperlipidemia)    Syphilis    Treated  02/14/2019    Past Surgical History:  Procedure Laterality Date   ABDOMINAL AORTOGRAM W/LOWER EXTREMITY N/A 01/11/2021   Procedure: ABDOMINAL AORTOGRAM W/LOWER EXTREMITY;  Surgeon: Leonie Douglas, MD;  Location: [facility];  Service: Cardiovascular;  Laterality: N/A;   ABDOMINAL AORTOGRAM W/LOWER EXTREMITY N/A 10/24/2021   Procedure: ABDOMINAL AORTOGRAM W/LOWER EXTREMITY;  Surgeon: Cephus Shelling, MD;  Location: [facility];   Service: Cardiovascular;  Laterality: N/A;   ABDOMINAL AORTOGRAM W/LOWER EXTREMITY Bilateral 09/25/2022   Procedure: ABDOMINAL AORTOGRAM W/LOWER EXTREMITY;  Surgeon: Cephus Shelling, MD;  Location: [facility];  Service: Cardiovascular;  Laterality: Bilateral;   ABDOMINAL AORTOGRAM W/LOWER EXTREMITY N/A 01/01/2023   Procedure: ABDOMINAL AORTOGRAM W/LOWER EXTREMITY;  Surgeon: Cephus Shelling, MD;  Location: [facility];  Service: Cardiovascular;  Laterality: N/A;   FEMORAL-POPLITEAL BYPASS GRAFT Left 10/22/2022   Procedure: LEFT LEG REDO SUPERFICAL SAPHENOUS VEIN TO CRYO SAPHENOUS VEIN BYPASS W/ GRAFT EXCISION;  Surgeon: Leonie Douglas, MD;  Location: [facility];  Service: Vascular;  Laterality: Left;   FEMORAL-TIBIAL BYPASS GRAFT Left 10/08/2022   Procedure: REDO LEFT SUPERFICIAL FEMORAL ARTERY-POSTERIOR TIBIAL BYPASS WITH PTFE;  Surgeon: Cephus Shelling, MD;  Location: [facility] OR;  Service: Vascular;  Laterality: Left;   LOWER EXTREMITY ANGIOGRAM Left 10/08/2022   Procedure: LOWER LEFT EXTREMITY ANGIOGRAM;  Surgeon: Cephus Shelling, MD;  Location: [facility] OR;  Service: Vascular;  Laterality: Left;   LOWER EXTREMITY ANGIOGRAPHY  11/20/2022   Procedure: Lower Extremity Angiography;  Surgeon: Cephus Shelling, MD;  Location: [facility];  Service: Cardiovascular;;   PERIPHERAL VASCULAR BALLOON ANGIOPLASTY  10/24/2021   Procedure: PERIPHERAL VASCULAR BALLOON ANGIOPLASTY;  Surgeon: Cephus Shelling, MD;  [facility]: [facility];  Service: Cardiovascular;;  Left SFA and Left Pop Bypass   PERIPHERAL VASCULAR BALLOON ANGIOPLASTY  01/01/2023   Procedure: PERIPHERAL VASCULAR BALLOON ANGIOPLASTY;  Surgeon: Cephus Shelling, MD;  Location:  [facility];  Service: Cardiovascular;;  lt popliteal - lithotripsy   PERIPHERAL VASCULAR INTERVENTION  11/20/2022   Procedure: PERIPHERAL VASCULAR INTERVENTION;  Surgeon: Cephus Shelling, MD;  Location: [facility];   Service: Cardiovascular;;   THROMBECTOMY OF BYPASS GRAFT FEMORAL- TIBIAL ARTERY Left 01/14/2021   Procedure: LEFT ABOVE KNEE TO  POSTERIOR TIBIAL BYPASS GRAFT WITH GREATER SAPHENOUS VEIN;  Surgeon: Cephus Shelling, MD;  Location: [facility];  Service: Vascular;  Laterality: Left;    Family History  Family history unknown: Yes    SOCIAL HISTORY: Social History   Socioeconomic History   Marital status: Single    Spouse name: Not on file   Number of children: Not on file   Years of education: Not on file   Highest education level: Not on file  Occupational History   Occupation: Holiday representative  Tobacco Use   Smoking status: Every Day    Current packs/day: 0.50    Types: Cigarettes    Passive exposure: Never   Smokeless tobacco: Never   Tobacco comments:    Patch is helping quit-2-5 a week-Been smoking since age 85 yrs old.5/24  Vaping Use   Vaping status: Never Used  Substance and Sexual Activity   Alcohol use: Yes    Alcohol/week: 20.0 standard drinks of alcohol    Types: 20 Standard drinks or equivalent per week    Comment: Approx 20-25 beers week, cut back now drinking 12 pk as of 10/03/22   Drug use: Not Currently    Frequency: 1.0 times per week    Types: Cocaine, Marijuana    Comment: smoke crack   Sexual activity: Yes    Birth control/protection: Condom  Other Topics Concern   Not on file  Social History Narrative   ** Merged History Encounter **       Social Drivers of Corporate investment banker Strain: Not on file  Food Insecurity: No Food Insecurity (10/24/2022)   Hunger Vital Sign    Worried About Running Out of Food in the Last Year: Never true    Ran Out of Food in the Last Year: Never true  Transportation Needs: No Transportation Needs (10/24/2022)   PRAPARE - Administrator, Civil Service (Medical): No    Lack of Transportation (Non-Medical): No  Physical Activity: Not on file  Stress: Not on file  Social Connections: Not on file   Intimate Partner Violence: Not At Risk (10/24/2022)   Humiliation, Afraid, Rape, and Kick questionnaire    Fear of Current or Ex-Partner: No    Emotionally Abused: No    Physically Abused: No    Sexually Abused: No    No Known Allergies  Current Outpatient Medications  Medication Sig Dispense Refill   acetaminophen (TYLENOL) 325 MG tablet Take 2 tablets (650 mg total) by mouth every 6 hours as needed for mild pain or moderate pain. 90 tablet 2   acetaminophen (TYLENOL) 500 MG tablet Take 2 tablets (1,000 mg total) by mouth every 6 hours as needed for moderate pain. 30 tablet 2   aspirin EC 81 MG tablet Take 1 tablet (81 mg total) by mouth daily. Swallow whole. 30 tablet 11   atorvastatin (LIPITOR) 40 MG tablet Take 1 tablet (40 mg total) by mouth daily. 90 tablet 1   Blood Glucose Monitoring Suppl (ONE TOUCH ULTRA 2) w/Device KIT Use to check blood sugar in the morning, at noon, and at bedtime. 1 kit 0   citalopram (  CELEXA) 20 MG tablet TAKE ONE TABLET (20 MG TOTAL) BY MOUTH DAILY AT 9AM 30 tablet 11   clopidogrel (PLAVIX) 75 MG tablet Take 1 tablet (75 mg total) by mouth daily. 90 tablet 3   Continuous Glucose Receiver (DEXCOM G7 RECEIVER) DEVI Use as directed to check blood sugars 1 each 0   ferrous sulfate (FEROSUL) 325 (65 FE) MG tablet Take 1 tablet (325 mg) by mouth daily with breakfast. 120 tablet 0   gabapentin (NEURONTIN) 300 MG capsule Take 1 capsule (300 mg total) by mouth 2 (two) times daily. 180 capsule 1   Glucose Blood (BLOOD GLUCOSE TEST STRIPS) STRP Use as directed in the morning, at noon, and at bedtime. 100 strip 0   Lancets (ONETOUCH DELICA PLUS LANCET33G) MISC 1 each in the morning, at noon, and at bedtime. 100 each 0   metFORMIN (GLUCOPHAGE) 500 MG tablet Take 1 tablet (500 mg total) by mouth daily with breakfast. 90 tablet 1   nicotine (NICODERM CQ) 21 mg/24hr patch Place 1 patch (21 mg total) onto the skin daily. 28 patch 0   nicotine polacrilex (NICORETTE) 4 MG gum  Take 1 each (4 mg total) by mouth as needed for smoking cessation. 100 tablet 0   omega-3 acid ethyl esters (LOVAZA) 1 g capsule TAKE ONE CAPSULE (1 G TOTAL) BY MOUTH TWICE DAILY 60 capsule 11   pantoprazole (PROTONIX) 40 MG tablet Take 1 tablet (40 mg total) by mouth daily. 90 tablet 1   No current facility-administered medications for this visit.    REVIEW OF SYSTEMS:  [X]  denotes positive finding, [ ]  denotes negative finding Cardiac  Comments:  Chest pain or chest pressure:    Shortness of breath upon exertion:    Short of breath when lying flat:    Irregular heart rhythm:        Vascular    Pain in calf, thigh, or hip brought on by ambulation:    Pain in feet at night that wakes you up from your sleep:     Blood clot in your veins:    Leg swelling:         Pulmonary    Oxygen at home:    Productive cough:     Wheezing:         Neurologic    Sudden weakness in arms or legs:     Sudden numbness in arms or legs:     Sudden onset of difficulty speaking or slurred speech:    Temporary loss of vision in one eye:     Problems with dizziness:         Gastrointestinal    Blood in stool:     Vomited blood:         Genitourinary    Burning when urinating:     Blood in urine:        Psychiatric    Major depression:         Hematologic    Bleeding problems:    Problems with blood clotting too easily:        Skin    Rashes or ulcers:        Constitutional    Fever or chills:      PHYSICAL EXAM: Vitals:   09/29/23 1501  BP: (!) 178/100  Pulse: 75  Resp: 18  Temp: 97.6 F (36.4 C)  TempSrc: Temporal  SpO2: 98%  Weight: 236 lb 4.8 oz (107.2 kg)  Height: 5\' 6"  (1.676 m)  GENERAL: The patient is a well-nourished male, in no acute distress. The vital signs are documented above. CARDIAC: There is a regular rate and rhythm.  VASCULAR:  Left femoral pulse palpable Left DP palpable No left leg tissue loss PULMONARY: No respiratory distress. ABDOMEN: Soft and  non-tender. MUSCULOSKELETAL: There are no major deformities or cyanosis. NEUROLOGIC: No focal weakness or paresthesias are detected. PSYCHIATRIC: The patient has a normal affect.  DATA:   ABIs today are 0.96 on the right monophasic and 0.86 on the left monophasic  Left leg arterial duplex shows patent stents with no stenosis in the SFA popliteal segment  Assessment/Plan:  58 y.o. male, with history of diabetes and tobacco abuse who presents for 3 month follow-up of his PAD.   He initially underwent left above-knee popliteal artery to posterior tibial bypass with great saphenous vein on 01/14/2021 for critical limb ischemia with tissue loss.  His bypass later occluded and he underwent a redo bypass with PTFE from SFA to PT on 10/08/22 and this got infected and had to be excised and replaced with cryo on 10/08/2022.  This subsequently thrombosed several weeks later.  Most recently underwent recanalization of his native system with SFA and above-knee popliteal angioplasty/stenting and AT angioplasty requiring retrograde tibial access of the DP at the ankle on 11/20/2022.  He then underwent additional left leg intervention on 01/01/2023 with shockwave lithotripsy of the popliteal artery behind the knee with popliteal stent using a 6 mm Eluvia.  Very pleased with how he is doing now and on evaluation today his wounds have healed on the left foot.  He has a palpable left DP pulse on exam.  Duplex today shows no evidence of significant recurrent stenosis in his left SFA popliteal stents.  I thought he would certainly face primary amputation at one point in time with ongoing bypass failure and I am very pleased with his progress.  I would like to keep a very close eye on him.  I will have him return in 3 months with left leg arterial duplex and ABIs with the PA visit.  Asked that he stay on aspirin Plavix statin.   Cephus Shelling, MD [facility] Office:  [phone]

## 2023-10-01 ENCOUNTER — Other Ambulatory Visit (HOSPITAL_COMMUNITY): Payer: Self-pay

## 2023-10-02 ENCOUNTER — Other Ambulatory Visit: Payer: Self-pay | Admitting: *Deleted

## 2023-10-02 DIAGNOSIS — I70322 Atherosclerosis of unspecified type of bypass graft(s) of the extremities with rest pain, left leg: Secondary | ICD-10-CM

## 2023-10-02 DIAGNOSIS — I739 Peripheral vascular disease, unspecified: Secondary | ICD-10-CM

## 2023-10-06 ENCOUNTER — Other Ambulatory Visit: Payer: Self-pay | Admitting: Nurse Practitioner

## 2023-10-06 DIAGNOSIS — D509 Iron deficiency anemia, unspecified: Secondary | ICD-10-CM

## 2023-10-07 ENCOUNTER — Encounter: Payer: Self-pay | Admitting: Pharmacist

## 2023-10-07 ENCOUNTER — Other Ambulatory Visit (HOSPITAL_COMMUNITY): Payer: Self-pay

## 2023-10-07 ENCOUNTER — Other Ambulatory Visit: Payer: Self-pay

## 2023-10-07 MED ORDER — ACETAMINOPHEN 325 MG PO TABS
650.0000 mg | ORAL_TABLET | Freq: Four times a day (QID) | ORAL | 2 refills | Status: DC | PRN
  Filled 2023-10-07 – 2023-11-06 (×2): qty 90, 12d supply, fill #0
  Filled 2023-12-13 – 2024-04-26 (×10): qty 90, 12d supply, fill #1

## 2023-10-07 MED ORDER — ACETAMINOPHEN 500 MG PO TABS
1000.0000 mg | ORAL_TABLET | Freq: Four times a day (QID) | ORAL | 2 refills | Status: DC | PRN
  Filled 2023-10-07 – 2023-11-07 (×3): qty 30, 4d supply, fill #0
  Filled 2023-12-13 – 2024-04-26 (×12): qty 30, 4d supply, fill #1

## 2023-10-07 MED ORDER — FERROUS SULFATE 325 (65 FE) MG PO TABS
325.0000 mg | ORAL_TABLET | Freq: Every day | ORAL | 0 refills | Status: DC
  Filled 2023-10-07 – 2024-01-02 (×7): qty 120, 120d supply, fill #0

## 2023-10-08 ENCOUNTER — Other Ambulatory Visit (HOSPITAL_COMMUNITY): Payer: Self-pay

## 2023-10-09 ENCOUNTER — Other Ambulatory Visit (HOSPITAL_COMMUNITY): Payer: Self-pay

## 2023-10-09 ENCOUNTER — Telehealth: Payer: Self-pay | Admitting: Podiatry

## 2023-10-09 NOTE — Telephone Encounter (Signed)
 Pt called to inform that he had to cancel appointment for 4/7 due to not having reliable transportation. He was made aware of the number of cancellations/no shows he has already had a can be discharged from the practice. He stated that is why he wanted to call to let us know he has been have trouble getting reliable transportation to get him to the appointments and want to work on securing dependable transportation before scheduling another appointment.

## 2023-10-12 ENCOUNTER — Other Ambulatory Visit: Payer: Self-pay

## 2023-10-12 ENCOUNTER — Ambulatory Visit: Admitting: Podiatry

## 2023-10-15 ENCOUNTER — Other Ambulatory Visit (HOSPITAL_COMMUNITY): Payer: Self-pay

## 2023-10-15 ENCOUNTER — Other Ambulatory Visit: Payer: Self-pay

## 2023-10-17 ENCOUNTER — Other Ambulatory Visit (HOSPITAL_COMMUNITY): Payer: Self-pay

## 2023-10-18 ENCOUNTER — Other Ambulatory Visit: Payer: Self-pay | Admitting: Nurse Practitioner

## 2023-10-18 DIAGNOSIS — E118 Type 2 diabetes mellitus with unspecified complications: Secondary | ICD-10-CM

## 2023-10-19 ENCOUNTER — Other Ambulatory Visit: Payer: Self-pay

## 2023-10-19 MED ORDER — ONETOUCH DELICA PLUS LANCET33G MISC
1.0000 | Freq: Three times a day (TID) | 0 refills | Status: DC
  Filled 2023-10-19 – 2023-11-07 (×5): qty 100, 33d supply, fill #0

## 2023-10-19 MED ORDER — BLOOD GLUCOSE TEST VI STRP
1.0000 | ORAL_STRIP | Freq: Three times a day (TID) | 0 refills | Status: DC
  Filled 2023-10-19 – 2023-11-06 (×4): qty 100, 34d supply, fill #0
  Filled 2023-11-07: qty 100, 33d supply, fill #0

## 2023-10-20 ENCOUNTER — Other Ambulatory Visit (HOSPITAL_COMMUNITY): Payer: Self-pay

## 2023-10-23 ENCOUNTER — Other Ambulatory Visit (HOSPITAL_COMMUNITY): Payer: Self-pay

## 2023-10-23 ENCOUNTER — Other Ambulatory Visit: Payer: Self-pay

## 2023-10-24 ENCOUNTER — Other Ambulatory Visit (HOSPITAL_COMMUNITY): Payer: Self-pay

## 2023-10-26 ENCOUNTER — Other Ambulatory Visit: Payer: Self-pay

## 2023-10-26 ENCOUNTER — Other Ambulatory Visit (HOSPITAL_COMMUNITY): Payer: Self-pay

## 2023-10-26 ENCOUNTER — Other Ambulatory Visit: Payer: Self-pay | Admitting: Nurse Practitioner

## 2023-10-26 DIAGNOSIS — K219 Gastro-esophageal reflux disease without esophagitis: Secondary | ICD-10-CM

## 2023-10-26 DIAGNOSIS — G621 Alcoholic polyneuropathy: Secondary | ICD-10-CM

## 2023-10-26 MED ORDER — PANTOPRAZOLE SODIUM 40 MG PO TBEC
40.0000 mg | DELAYED_RELEASE_TABLET | Freq: Every day | ORAL | 1 refills | Status: DC
  Filled 2023-10-26 – 2023-10-28 (×3): qty 90, 90d supply, fill #0
  Filled 2023-11-06 – 2023-11-07 (×2): qty 90, 90d supply, fill #1

## 2023-10-28 ENCOUNTER — Other Ambulatory Visit: Payer: Self-pay

## 2023-10-29 ENCOUNTER — Other Ambulatory Visit: Payer: Self-pay

## 2023-10-30 ENCOUNTER — Other Ambulatory Visit: Payer: Self-pay | Admitting: Nurse Practitioner

## 2023-10-30 ENCOUNTER — Other Ambulatory Visit: Payer: Self-pay

## 2023-10-30 ENCOUNTER — Other Ambulatory Visit (HOSPITAL_COMMUNITY): Payer: Self-pay

## 2023-10-30 DIAGNOSIS — K219 Gastro-esophageal reflux disease without esophagitis: Secondary | ICD-10-CM

## 2023-10-30 DIAGNOSIS — G621 Alcoholic polyneuropathy: Secondary | ICD-10-CM

## 2023-10-30 NOTE — Telephone Encounter (Signed)
 FYI- medications appear to have been refilled on 10/26/23.

## 2023-10-30 NOTE — Telephone Encounter (Signed)
 Copied from CRM [id number]. Topic: Clinical - Medication Refill >> Oct 30, 2023  3:37 PM Juluis Ok wrote: Most Recent Primary Care Visit:  Provider: Jerrlyn Morel  Department: [facility]  Visit Type: OFFICE VISIT  Date: 06/24/2023  Medication: pantoprazole  (PROTONIX ) 40 MG tablet gabapentin  (NEURONTIN ) 300 MG capsule  Has the patient contacted their pharmacy? Yes (Agent: If no, request that the patient contact the pharmacy for the refill. If patient does not wish to contact the pharmacy document the reason why and proceed with request.) (Agent: If yes, when and what did the pharmacy advise?)  Is this the correct pharmacy for this prescription? Yes If no, delete pharmacy and type the correct one.  This is the patient's preferred pharmacy:    [facility] - [place], [place] - [address] [place] [address] Phone: [phone] Fax: [phone]   Has the prescription been filled recently? No  Is the patient out of the medication? Yes  Has the patient been seen for an appointment in the last year OR does the patient have an upcoming appointment? Yes  Can we respond through MyChart? yes  Agent: Please be advised that Rx refills may take up to 3 business days. We ask that you follow-up with your pharmacy.

## 2023-11-04 ENCOUNTER — Other Ambulatory Visit (HOSPITAL_COMMUNITY): Payer: Self-pay

## 2023-11-06 ENCOUNTER — Other Ambulatory Visit: Payer: Self-pay | Admitting: Nurse Practitioner

## 2023-11-06 DIAGNOSIS — K219 Gastro-esophageal reflux disease without esophagitis: Secondary | ICD-10-CM

## 2023-11-06 DIAGNOSIS — G621 Alcoholic polyneuropathy: Secondary | ICD-10-CM

## 2023-11-06 NOTE — Telephone Encounter (Signed)
 Copied from CRM [id number]. Topic: Clinical - Medication Refill >> Nov 06, 2023 12:30 PM Lizabeth Riggs wrote: Most Recent Primary Care Visit:  Provider: Jerrlyn Morel  Department: [facility]  Visit Type: OFFICE VISIT  Date: 06/24/2023  Medication: gabapentin  (NEURONTIN ) 300 MG capsule AND pantoprazole  (PROTONIX ) 40 MG tablet - 90 day supply for both medications  Has the patient contacted their pharmacy? Yes (Agent: If no, request that the patient contact the pharmacy for the refill. If patient does not wish to contact the pharmacy document the reason why and proceed with request.) (Agent: If yes, when and what did the pharmacy advise?) Pharmacy needs order to refill  Is this the correct pharmacy for this prescription? Yes If no, delete pharmacy and type the correct one.  This is the patient's preferred pharmacy:   [facility] - [place], [place] - [address] [place] [address] Phone: [phone] Fax: [phone]   Has the prescription been filled recently? No  Is the patient out of the medication? No  Has the patient been seen for an appointment in the last year OR does the patient have an upcoming appointment? Yes  Can we respond through MyChart? No  Agent: Please be advised that Rx refills may take up to 3 business days. We ask that you follow-up with your pharmacy.

## 2023-11-07 ENCOUNTER — Other Ambulatory Visit (HOSPITAL_COMMUNITY): Payer: Self-pay

## 2023-11-07 ENCOUNTER — Other Ambulatory Visit: Payer: Self-pay

## 2023-11-07 ENCOUNTER — Other Ambulatory Visit: Payer: Self-pay | Admitting: Nurse Practitioner

## 2023-11-07 DIAGNOSIS — Z720 Tobacco use: Secondary | ICD-10-CM

## 2023-11-09 ENCOUNTER — Other Ambulatory Visit: Payer: Self-pay

## 2023-11-09 ENCOUNTER — Other Ambulatory Visit (HOSPITAL_COMMUNITY): Payer: Self-pay

## 2023-11-09 MED ORDER — NICOTINE POLACRILEX 4 MG MT GUM
4.0000 mg | CHEWING_GUM | OROMUCOSAL | 0 refills | Status: DC | PRN
  Filled 2023-11-09: qty 100, 15d supply, fill #0
  Filled 2023-12-13 – 2024-03-24 (×8): qty 100, 30d supply, fill #0

## 2023-11-09 MED ORDER — NICOTINE 21 MG/24HR TD PT24
21.0000 mg | MEDICATED_PATCH | Freq: Every day | TRANSDERMAL | 0 refills | Status: DC
  Filled 2023-11-09 – 2023-11-25 (×2): qty 28, 28d supply, fill #0

## 2023-11-11 ENCOUNTER — Other Ambulatory Visit: Payer: Self-pay

## 2023-11-13 ENCOUNTER — Other Ambulatory Visit: Payer: Self-pay

## 2023-11-18 ENCOUNTER — Other Ambulatory Visit: Payer: Self-pay | Admitting: Nurse Practitioner

## 2023-11-18 DIAGNOSIS — E118 Type 2 diabetes mellitus with unspecified complications: Secondary | ICD-10-CM

## 2023-11-18 DIAGNOSIS — K219 Gastro-esophageal reflux disease without esophagitis: Secondary | ICD-10-CM

## 2023-11-18 DIAGNOSIS — G621 Alcoholic polyneuropathy: Secondary | ICD-10-CM

## 2023-11-18 NOTE — Telephone Encounter (Unsigned)
 Copied from CRM [id number]. Topic: Clinical - Medication Refill >> Nov 18, 2023  2:43 PM Fredrica W wrote: Medication:  metFORMIN  (GLUCOPHAGE ) 500 MG tablet atorvastatin  (LIPITOR) 40 MG tablet gabapentin  (NEURONTIN ) 300 MG capsule pantoprazole  (PROTONIX ) 40 MG tablet   Has the patient contacted their pharmacy? Yes - Pharmacy requested  (Agent: If no, request that the patient contact the pharmacy for the refill. If patient does not wish to contact the pharmacy document the reason why and proceed with request.) (Agent: If yes, when and what did the pharmacy advise?)  This is the patient's preferred pharmacy:  [facility] - [place], [place] - [address] [place] [address] Phone: [phone] Fax: [phone]  Is this the correct pharmacy for this prescription? Yes If no, delete pharmacy and type the correct one.   Has the prescription been filled recently? No  Is the patient out of the medication? No - almost  Has the patient been seen for an appointment in the last year OR does the patient have an upcoming appointment? Yes  Can we respond through MyChart? No  Agent: Please be advised that Rx refills may take up to 3 business days. We ask that you follow-up with your pharmacy.

## 2023-11-19 ENCOUNTER — Other Ambulatory Visit: Payer: Self-pay

## 2023-11-19 ENCOUNTER — Other Ambulatory Visit (HOSPITAL_COMMUNITY): Payer: Self-pay

## 2023-11-19 MED ORDER — ATORVASTATIN CALCIUM 40 MG PO TABS
40.0000 mg | ORAL_TABLET | Freq: Every day | ORAL | 1 refills | Status: DC
  Filled 2023-11-19 – 2023-11-25 (×2): qty 90, 90d supply, fill #0
  Filled 2023-12-10 – 2024-06-02 (×22): qty 90, 90d supply, fill #1

## 2023-11-19 MED ORDER — PANTOPRAZOLE SODIUM 40 MG PO TBEC
40.0000 mg | DELAYED_RELEASE_TABLET | Freq: Every day | ORAL | 1 refills | Status: DC
  Filled 2023-11-19 – 2024-02-15 (×8): qty 90, 90d supply, fill #0
  Filled 2024-02-21 – 2024-06-02 (×14): qty 90, 90d supply, fill #1

## 2023-11-19 MED ORDER — GABAPENTIN 300 MG PO CAPS
300.0000 mg | ORAL_CAPSULE | Freq: Two times a day (BID) | ORAL | 11 refills | Status: AC
  Filled 2023-11-19 – 2023-11-25 (×3): qty 180, 90d supply, fill #0
  Filled 2023-12-13 – 2024-06-02 (×21): qty 180, 90d supply, fill #1
  Filled 2024-06-07 – 2024-07-22 (×2): qty 180, 90d supply, fill #2

## 2023-11-19 MED ORDER — METFORMIN HCL 500 MG PO TABS
500.0000 mg | ORAL_TABLET | Freq: Every day | ORAL | 1 refills | Status: DC
  Filled 2023-11-19 – 2023-11-25 (×2): qty 90, 90d supply, fill #0
  Filled 2024-02-15 – 2024-06-02 (×19): qty 90, 90d supply, fill #1

## 2023-11-19 NOTE — Telephone Encounter (Signed)
 Please advise [facility]

## 2023-11-25 ENCOUNTER — Other Ambulatory Visit: Payer: Self-pay | Admitting: Nurse Practitioner

## 2023-11-25 DIAGNOSIS — E118 Type 2 diabetes mellitus with unspecified complications: Secondary | ICD-10-CM

## 2023-11-26 ENCOUNTER — Other Ambulatory Visit (HOSPITAL_COMMUNITY): Payer: Self-pay

## 2023-11-26 ENCOUNTER — Other Ambulatory Visit: Payer: Self-pay

## 2023-11-26 ENCOUNTER — Other Ambulatory Visit: Payer: Self-pay | Admitting: Nurse Practitioner

## 2023-11-26 DIAGNOSIS — K219 Gastro-esophageal reflux disease without esophagitis: Secondary | ICD-10-CM

## 2023-11-26 DIAGNOSIS — G621 Alcoholic polyneuropathy: Secondary | ICD-10-CM

## 2023-11-26 MED ORDER — ONETOUCH DELICA PLUS LANCET33G MISC
1.0000 | Freq: Three times a day (TID) | 0 refills | Status: DC
  Filled 2023-11-26 – 2023-12-07 (×2): qty 100, 33d supply, fill #0

## 2023-11-26 MED ORDER — PANTOPRAZOLE SODIUM 40 MG PO TBEC: 40.0000 mg | DELAYED_RELEASE_TABLET | Freq: Every day | ORAL | 1 refills | Status: DC

## 2023-11-26 MED ORDER — BLOOD GLUCOSE TEST VI STRP
1.0000 | ORAL_STRIP | Freq: Three times a day (TID) | 0 refills | Status: DC
  Filled 2023-11-26 – 2023-12-07 (×2): qty 100, 34d supply, fill #0

## 2023-11-26 NOTE — Telephone Encounter (Signed)
 Please advise [facility]

## 2023-11-26 NOTE — Telephone Encounter (Signed)
 Copied from CRM [id number]. Topic: Clinical - Medication Refill >> Nov 26, 2023 12:55 PM Carlatta H wrote: Medication:  gabapentin  (NEURONTIN ) 300 MG capsule pantoprazole  (PROTONIX ) 40 MG tablet  Has the patient contacted their pharmacy? No (Agent: If no, request that the patient contact the pharmacy for the refill. If patient does not wish to contact the pharmacy document the reason why and proceed with request.) (Agent: If yes, when and what did the pharmacy advise?)  This is the patient's preferred pharmacy:    [facility] - [place] - [address] [place] [address] Phone: [phone] Fax: [phone]  Is this the correct pharmacy for this prescription? Yes If no, delete pharmacy and type the correct one.   Has the prescription been filled recently? No  Is the patient out of the medication? Yes  Has the patient been seen for an appointment in the last year OR does the patient have an upcoming appointment? Yes  Can we respond through MyChart? Yes  Agent: Please be advised that Rx refills may take up to 3 business days. We ask that you follow-up with your pharmacy.

## 2023-11-27 NOTE — Telephone Encounter (Signed)
 This was just sent in a week ago. Please advise . KH

## 2023-12-08 ENCOUNTER — Other Ambulatory Visit: Payer: Self-pay

## 2023-12-08 ENCOUNTER — Other Ambulatory Visit (HOSPITAL_COMMUNITY): Payer: Self-pay

## 2023-12-08 ENCOUNTER — Ambulatory Visit: Attending: Vascular Surgery | Admitting: Physician Assistant

## 2023-12-08 ENCOUNTER — Ambulatory Visit (HOSPITAL_COMMUNITY)
Admission: RE | Admit: 2023-12-08 | Discharge: 2023-12-08 | Disposition: A | Discharge status: Home / Self Care | Source: Ambulatory Visit | Attending: Vascular Surgery | Admitting: Vascular Surgery

## 2023-12-08 VITALS — BP 153/93 | HR 84 | Temp 98.7°F | Resp 18 | Ht 66.0 in | Wt 225.4 lb

## 2023-12-08 DIAGNOSIS — Z95820 Peripheral vascular angioplasty status with implants and grafts: Secondary | ICD-10-CM | POA: Diagnosis not present

## 2023-12-08 DIAGNOSIS — I739 Peripheral vascular disease, unspecified: Secondary | ICD-10-CM

## 2023-12-08 DIAGNOSIS — I70322 Atherosclerosis of unspecified type of bypass graft(s) of the extremities with rest pain, left leg: Secondary | ICD-10-CM

## 2023-12-08 LAB — VAS US ABI WITH/WO TBI
Left ABI: 0.77
Right ABI: 0.91

## 2023-12-08 NOTE — Progress Notes (Signed)
 Office Note     CC:  follow up Requesting Provider:  Jerrlyn Morel, NP  HPI: Kishaun Erekson. is a 58 y.o. (02-19-1966) male who presents for surveillance follow up of PAD. He has extensive history of LLE interventions including the following: left above-knee popliteal artery to posterior tibial bypass with great saphenous vein on 01/14/2021 for critical limb ischemia with tissue loss. His bypass later occluded and he underwent a redo bypass with PTFE from SFA to PT on 10/08/22 and this got infected and had to be excised and replaced with cryo on 10/08/2022. This subsequently thrombosed several weeks later. Most recently underwent recanalization of his native system with SFA and above-knee popliteal angioplasty/stenting and AT angioplasty requiring retrograde tibial access of the DP at the ankle on 11/20/2022. Additionally he had left leg intervention on 01/01/2023 with shockwave lithotripsy of the popliteal artery behind the knee with popliteal stent using a 6 mm Eluvia.   At his last visit with Dr. Fulton Job in March he was without any claudication, rest pain or tissue loss in his left leg. He was working on smoking cessation, down to just a couple cigarettes per day.   Today he reports overall doing well. He has pain in his left knee. This is most bothersome at night. He otherwise is able to walk without any pain in either leg. No pain at rest in his feet. No tissue loss. He says he walks 15-20 minutes every morning and afternoon. He is medically managed on Aspirin , Statin and Plavix . He is still trying to work on quitting smoking.   Past Medical History:  Diagnosis Date   Diabetes mellitus without complication (HCC)    type 2   Family history of adverse reaction to anesthesia    HLD (hyperlipidemia)    Syphilis    Treated  02/14/2019    Past Surgical History:  Procedure Laterality Date   ABDOMINAL AORTOGRAM W/LOWER EXTREMITY N/A 01/11/2021   Procedure: ABDOMINAL AORTOGRAM W/LOWER EXTREMITY;   Surgeon: Carlene Che, MD;  Location: [facility];  Service: Cardiovascular;  Laterality: N/A;   ABDOMINAL AORTOGRAM W/LOWER EXTREMITY N/A 10/24/2021   Procedure: ABDOMINAL AORTOGRAM W/LOWER EXTREMITY;  Surgeon: Young Hensen, MD;  Location: [facility];  Service: Cardiovascular;  Laterality: N/A;   ABDOMINAL AORTOGRAM W/LOWER EXTREMITY Bilateral 09/25/2022   Procedure: ABDOMINAL AORTOGRAM W/LOWER EXTREMITY;  Surgeon: Young Hensen, MD;  Location: [facility];  Service: Cardiovascular;  Laterality: Bilateral;   ABDOMINAL AORTOGRAM W/LOWER EXTREMITY N/A 01/01/2023   Procedure: ABDOMINAL AORTOGRAM W/LOWER EXTREMITY;  Surgeon: Young Hensen, MD;  Location: [facility];  Service: Cardiovascular;  Laterality: N/A;   FEMORAL-POPLITEAL BYPASS GRAFT Left 10/22/2022   Procedure: LEFT LEG REDO SUPERFICAL SAPHENOUS VEIN TO CRYO SAPHENOUS VEIN BYPASS W/ GRAFT EXCISION;  Surgeon: Carlene Che, MD;  Location: [facility];  Service: Vascular;  Laterality: Left;   FEMORAL-TIBIAL BYPASS GRAFT Left 10/08/2022   Procedure: REDO LEFT SUPERFICIAL FEMORAL ARTERY-POSTERIOR TIBIAL BYPASS WITH PTFE;  Surgeon: Young Hensen, MD;  Location: [facility];  Service: Vascular;  Laterality: Left;   LOWER EXTREMITY ANGIOGRAM Left 10/08/2022   Procedure: LOWER LEFT EXTREMITY ANGIOGRAM;  Surgeon: Young Hensen, MD;  Location: [facility] OR;  Service: Vascular;  Laterality: Left;   LOWER EXTREMITY ANGIOGRAPHY  11/20/2022   Procedure: Lower Extremity Angiography;  Surgeon: Young Hensen, MD;  Location: [facility];  Service: Cardiovascular;;   PERIPHERAL VASCULAR BALLOON ANGIOPLASTY  10/24/2021  Procedure: PERIPHERAL VASCULAR BALLOON ANGIOPLASTY;  Surgeon: Young Hensen, MD;  Location: [facility];  Service: Cardiovascular;;  Left SFA and Left Pop Bypass   PERIPHERAL VASCULAR BALLOON ANGIOPLASTY  01/01/2023   Procedure: PERIPHERAL VASCULAR BALLOON ANGIOPLASTY;   Surgeon: Young Hensen, MD;  Location: [facility];  Service: Cardiovascular;;  lt popliteal - lithotripsy   PERIPHERAL VASCULAR INTERVENTION  11/20/2022   Procedure: PERIPHERAL VASCULAR INTERVENTION;  Surgeon: Young Hensen, MD;  Location: [facility];  Service: Cardiovascular;;   THROMBECTOMY OF BYPASS GRAFT FEMORAL- TIBIAL ARTERY Left 01/14/2021   Procedure: LEFT ABOVE KNEE TO  POSTERIOR TIBIAL BYPASS GRAFT WITH GREATER SAPHENOUS VEIN;  Surgeon: Young Hensen, MD;  Location: [facility];  Service: Vascular;  Laterality: Left;    Social History   Socioeconomic History   Marital status: Single    Spouse name: Not on file   Number of children: Not on file   Years of education: Not on file   Highest education level: Not on file  Occupational History   Occupation: Holiday representative  Tobacco Use   Smoking status: Every Day    Current packs/day: 0.50    Types: Cigarettes    Passive exposure: Never   Smokeless tobacco: Never   Tobacco comments:    Patch is helping quit-2-5 a week-Been smoking since age 16 yrs old.5/24  Vaping Use   Vaping status: Never Used  Substance and Sexual Activity   Alcohol  use: Yes    Alcohol /week: 20.0 standard drinks of alcohol     Types: 20 Standard drinks or equivalent per week    Comment: Approx 20-25 beers week, cut back now drinking 12 pk as of 10/03/22   Drug use: Not Currently    Frequency: 1.0 times per week    Types: Cocaine, Marijuana    Comment: smoke crack   Sexual activity: Yes    Birth control/protection: Condom  Other Topics Concern   Not on file  Social History Narrative   ** Merged History Encounter **       Social Drivers of Corporate investment banker Strain: Not on file  Food Insecurity: No Food Insecurity (10/24/2022)   Hunger Vital Sign    Worried About Running Out of Food in the Last Year: Never true    Ran Out of Food in the Last Year: Never true  Transportation Needs: No Transportation Needs  (10/24/2022)   PRAPARE - Administrator, Civil Service (Medical): No    Lack of Transportation (Non-Medical): No  Physical Activity: Not on file  Stress: Not on file  Social Connections: Not on file  Intimate Partner Violence: Not At Risk (10/24/2022)   Humiliation, Afraid, Rape, and Kick questionnaire    Fear of Current or Ex-Partner: No    Emotionally Abused: No    Physically Abused: No    Sexually Abused: No    Family History  Family history unknown: Yes    Current Outpatient Medications  Medication Sig Dispense Refill   acetaminophen  (TYLENOL ) 325 MG tablet Take 2 tablets (650 mg total) by mouth every 6 hours as needed for mild pain or moderate pain. 90 tablet 2   acetaminophen  (TYLENOL ) 500 MG tablet Take 2 tablets (1,000 mg total) by mouth every 6 hours as needed for moderate pain. 30 tablet 2   aspirin  EC 81 MG tablet Take 1 tablet (81 mg total) by mouth daily. Swallow whole. 30 tablet 11   atorvastatin  (LIPITOR) 40 MG  tablet Take 1 tablet (40 mg total) by mouth daily. 90 tablet 1   Blood Glucose Monitoring Suppl (ONE TOUCH ULTRA 2) w/Device KIT Use to check blood sugar in the morning, at noon, and at bedtime. 1 kit 0   citalopram  (CELEXA ) 20 MG tablet TAKE ONE TABLET (20 MG TOTAL) BY MOUTH DAILY AT 9AM 30 tablet 11   clopidogrel  (PLAVIX ) 75 MG tablet Take 1 tablet (75 mg total) by mouth daily. 90 tablet 3   ferrous sulfate  (FEROSUL) 325 (65 FE) MG tablet Take 1 tablet (325 mg) by mouth daily with breakfast. 120 tablet 0   gabapentin  (NEURONTIN ) 300 MG capsule Take 1 capsule (300 mg total) by mouth 2 (two) times daily. 180 capsule 11   Glucose Blood (BLOOD GLUCOSE TEST STRIPS) STRP Use as directed in the morning, at noon, and at bedtime. 100 strip 0   Lancets (ONETOUCH DELICA PLUS LANCET33G) MISC Use as directed in the morning, at noon, and at bedtime. 100 each 0   metFORMIN  (GLUCOPHAGE ) 500 MG tablet Take 1 tablet (500 mg total) by mouth daily with breakfast. 90  tablet 1   nicotine  (NICODERM CQ ) 21 mg/24hr patch Place 1 patch (21 mg total) onto the skin daily. 28 patch 0   nicotine  polacrilex (NICORETTE ) 4 MG gum Chew 1 piece (4 mg total) as needed for smoking cessation. 100 tablet 0   omega-3 acid ethyl esters (LOVAZA ) 1 g capsule TAKE ONE CAPSULE (1 G TOTAL) BY MOUTH TWICE DAILY 60 capsule 11   pantoprazole  (PROTONIX ) 40 MG tablet Take 1 tablet (40 mg total) by mouth daily. 90 tablet 1   pantoprazole  (PROTONIX ) 40 MG tablet Take 1 tablet (40 mg total) by mouth daily. 90 tablet 1   Continuous Glucose Receiver (DEXCOM G7 RECEIVER) DEVI Use as directed to check blood sugars 1 each 0   No current facility-administered medications for this visit.    No Known Allergies   REVIEW OF SYSTEMS:  [X]  denotes positive finding, [ ]  denotes negative finding Cardiac  Comments:  Chest pain or chest pressure:    Shortness of breath upon exertion:    Short of breath when lying flat:    Irregular heart rhythm:        Vascular    Pain in calf, thigh, or hip brought on by ambulation:    Pain in feet at night that wakes you up from your sleep:     Blood clot in your veins:    Leg swelling:         Pulmonary    Oxygen at home:    Productive cough:     Wheezing:         Neurologic    Sudden weakness in arms or legs:     Sudden numbness in arms or legs:     Sudden onset of difficulty speaking or slurred speech:    Temporary loss of vision in one eye:     Problems with dizziness:         Gastrointestinal    Blood in stool:     Vomited blood:         Genitourinary    Burning when urinating:     Blood in urine:        Psychiatric    Major depression:         Hematologic    Bleeding problems:    Problems with blood clotting too easily:        Skin  Rashes or ulcers:        Constitutional    Fever or chills:      PHYSICAL EXAMINATION:  Vitals:   12/08/23 1131  BP: (!) 153/93  Pulse: 84  Resp: 18  Temp: 98.7 F (37.1 C)  TempSrc:  Temporal  SpO2: 96%  Weight: 225 lb 6.4 oz (102.2 kg)  Height: 5\' 6"  (1.676 m)    General:  WDWN in NAD; vital signs documented above Gait: Normal HENT: WNL, normocephalic Pulmonary: normal non-labored breathing Cardiac: regular HR Abdomen: soft Vascular Exam/Pulses: 2+ femoral, doppler DP/PT/Peroneal signals bilaterally. Feet warm and well perfused Extremities: without ischemic changes, without Gangrene , without cellulitis; without open wounds;  Musculoskeletal: no muscle wasting or atrophy  Neurologic: A&O X 3 Psychiatric:  The pt has Normal affect.   Non-Invasive Vascular Imaging:    +-------+-----------+-----------+------------+------------+  ABI/TBIToday's ABIToday's TBIPrevious ABIPrevious TBI  +-------+-----------+-----------+------------+------------+  Right 0.91       0.17       0.96        0.20          +-------+-----------+-----------+------------+------------+  Left  0.77       0.43       0.86        0.31          +-------+-----------+-----------+------------+------------+   VAS US  Lower extremity arterial duplex: Summary:  Left: Patent stent with increased velocity in the outflow artery suggestive of 1-49% stenosis.      ASSESSMENT/PLAN:: 58 y.o. male here for follow up for PAD. He has extensive LLE intervention history including several failed bypasses. He most recently had left leg intervention on 01/01/2023 by Dr. Fulton Job with shockwave lithotripsy of the popliteal artery behind the knee with popliteal stent using a 6 mm Eluvia. He has been without claudication, rest pain or tissue loss. He presently is having some knee pain but I suspect this is musculoskeletal. He has brisk doppler signals on exam.  - Overall ABI stable from prior study, slight decrease on the left - Duplex shows patent left SFA/popliteal stent. Duplex is unchanged from March - Recommend continued walking regimen and smoking cessation - continue Aspirin , Statin, Plavix  - He  will follow up in 3 months with repeat LLE arterial duplex and ABI - he knows to follow up earlier if any new or worsening symptoms   Deneen Finical, PA-C [facility] [phone]  Clinic MD:  Edgardo Goodwill

## 2023-12-10 ENCOUNTER — Other Ambulatory Visit: Payer: Self-pay | Admitting: Nurse Practitioner

## 2023-12-10 ENCOUNTER — Other Ambulatory Visit: Payer: Self-pay | Admitting: *Deleted

## 2023-12-10 DIAGNOSIS — I70322 Atherosclerosis of unspecified type of bypass graft(s) of the extremities with rest pain, left leg: Secondary | ICD-10-CM

## 2023-12-10 DIAGNOSIS — E118 Type 2 diabetes mellitus with unspecified complications: Secondary | ICD-10-CM

## 2023-12-10 DIAGNOSIS — I739 Peripheral vascular disease, unspecified: Secondary | ICD-10-CM

## 2023-12-11 ENCOUNTER — Other Ambulatory Visit (HOSPITAL_COMMUNITY): Payer: Self-pay

## 2023-12-11 ENCOUNTER — Other Ambulatory Visit: Payer: Self-pay

## 2023-12-11 MED ORDER — BLOOD GLUCOSE TEST VI STRP
1.0000 | ORAL_STRIP | Freq: Three times a day (TID) | 0 refills | Status: DC
  Filled 2023-12-11 – 2024-01-02 (×6): qty 100, 34d supply, fill #0

## 2023-12-11 MED ORDER — ONETOUCH DELICA PLUS LANCET33G MISC
1.0000 | Freq: Three times a day (TID) | 0 refills | Status: DC
  Filled 2023-12-11 – 2024-01-02 (×7): qty 100, 33d supply, fill #0

## 2023-12-13 ENCOUNTER — Other Ambulatory Visit: Payer: Self-pay | Admitting: Nurse Practitioner

## 2023-12-14 ENCOUNTER — Other Ambulatory Visit: Payer: Self-pay | Admitting: Nurse Practitioner

## 2023-12-14 ENCOUNTER — Other Ambulatory Visit: Payer: Self-pay

## 2023-12-14 MED ORDER — NICOTINE 21 MG/24HR TD PT24
21.0000 mg | MEDICATED_PATCH | Freq: Every day | TRANSDERMAL | 0 refills | Status: DC
  Filled 2023-12-14: qty 28, 28d supply, fill #0

## 2023-12-14 MED ORDER — NICOTINE 14 MG/24HR TD PT24
14.0000 mg | MEDICATED_PATCH | TRANSDERMAL | 0 refills | Status: DC
  Filled 2023-12-14 – 2023-12-24 (×3): qty 30, 30d supply, fill #0
  Filled 2023-12-30: qty 28, 28d supply, fill #0
  Filled 2024-01-14 – 2024-02-10 (×5): qty 28, 28d supply, fill #1

## 2023-12-14 NOTE — Telephone Encounter (Signed)
 Please advise [facility]

## 2023-12-15 ENCOUNTER — Encounter: Payer: Self-pay | Admitting: Pharmacist

## 2023-12-15 ENCOUNTER — Other Ambulatory Visit: Payer: Self-pay

## 2023-12-15 ENCOUNTER — Other Ambulatory Visit (HOSPITAL_COMMUNITY): Payer: Self-pay

## 2023-12-16 ENCOUNTER — Other Ambulatory Visit (HOSPITAL_COMMUNITY): Payer: Self-pay

## 2023-12-17 ENCOUNTER — Other Ambulatory Visit: Payer: Self-pay

## 2023-12-17 ENCOUNTER — Encounter: Payer: Self-pay | Admitting: Pharmacist

## 2023-12-18 ENCOUNTER — Other Ambulatory Visit: Payer: Self-pay

## 2023-12-23 ENCOUNTER — Ambulatory Visit: Payer: Self-pay | Admitting: Nurse Practitioner

## 2023-12-25 ENCOUNTER — Other Ambulatory Visit (HOSPITAL_COMMUNITY): Payer: Self-pay

## 2023-12-25 ENCOUNTER — Other Ambulatory Visit: Payer: Self-pay

## 2023-12-30 ENCOUNTER — Other Ambulatory Visit (HOSPITAL_COMMUNITY): Payer: Self-pay

## 2023-12-31 ENCOUNTER — Other Ambulatory Visit (HOSPITAL_COMMUNITY): Payer: Self-pay

## 2023-12-31 ENCOUNTER — Other Ambulatory Visit: Payer: Self-pay

## 2024-01-04 ENCOUNTER — Other Ambulatory Visit: Payer: Self-pay

## 2024-01-04 ENCOUNTER — Other Ambulatory Visit (HOSPITAL_COMMUNITY): Payer: Self-pay

## 2024-01-06 ENCOUNTER — Other Ambulatory Visit: Payer: Self-pay | Admitting: Nurse Practitioner

## 2024-01-06 DIAGNOSIS — E118 Type 2 diabetes mellitus with unspecified complications: Secondary | ICD-10-CM

## 2024-01-07 ENCOUNTER — Other Ambulatory Visit (HOSPITAL_COMMUNITY): Payer: Self-pay

## 2024-01-07 ENCOUNTER — Other Ambulatory Visit: Payer: Self-pay

## 2024-01-07 MED ORDER — ONETOUCH DELICA PLUS LANCET33G MISC
1.0000 | Freq: Three times a day (TID) | 0 refills | Status: DC
  Filled 2024-01-07 – 2024-02-10 (×5): qty 100, 33d supply, fill #0

## 2024-01-07 MED ORDER — BLOOD GLUCOSE TEST VI STRP
1.0000 | ORAL_STRIP | Freq: Three times a day (TID) | 0 refills | Status: DC
  Filled 2024-01-07 – 2024-01-14 (×2): qty 100, 34d supply, fill #0
  Filled 2024-01-18: qty 100, 33d supply, fill #0
  Filled 2024-01-20 – 2024-02-10 (×2): qty 100, 34d supply, fill #0

## 2024-01-14 ENCOUNTER — Telehealth: Payer: Self-pay

## 2024-01-14 NOTE — Telephone Encounter (Signed)
 Copied from CRM [id number]. Topic: General - Other >> Jan 14, 2024 10:16 AM Emylou G wrote: Reason for CRM: Jackolyn w/[employer] ( his insurance w/specialty ) hypertension program.. [phone] option 1 .. She adv patient labs that are due: Urine for uacr, A1c ( whole blood ), basic metabolic panel ( can be syrum or plasma )  Not sure what is going here. Please advise or call back. KH

## 2024-01-15 ENCOUNTER — Encounter: Payer: Self-pay | Admitting: Pharmacist

## 2024-01-15 ENCOUNTER — Other Ambulatory Visit (HOSPITAL_COMMUNITY): Payer: Self-pay

## 2024-01-15 ENCOUNTER — Other Ambulatory Visit: Payer: Self-pay

## 2024-01-19 ENCOUNTER — Other Ambulatory Visit: Payer: Self-pay

## 2024-01-19 ENCOUNTER — Other Ambulatory Visit (HOSPITAL_COMMUNITY): Payer: Self-pay

## 2024-01-20 ENCOUNTER — Other Ambulatory Visit: Payer: Self-pay

## 2024-01-21 ENCOUNTER — Other Ambulatory Visit: Payer: Self-pay

## 2024-01-21 ENCOUNTER — Other Ambulatory Visit (HOSPITAL_COMMUNITY): Payer: Self-pay

## 2024-01-25 ENCOUNTER — Telehealth: Payer: Self-pay

## 2024-01-25 NOTE — Telephone Encounter (Signed)
 Copied from CRM [id number]. Topic: Clinical - Medical Advice >> Jan 25, 2024 11:37 AM Vena H wrote: Reason for CRM: [facility] called in stating pt needs labs EGFR, UACR, and Hemoglobin A1C ran.  Pt's recent bp reading from 7/10-7/13 ,   7/10 - 148/90 hr 66 7/11 - 154/90 hr 77 7/12 - 145/86 hr 79 7/13 - 137/85 hr 75   Pt has appt 01/28/24.KH

## 2024-01-28 ENCOUNTER — Ambulatory Visit: Payer: Self-pay | Admitting: Nurse Practitioner

## 2024-02-05 ENCOUNTER — Telehealth: Payer: Self-pay

## 2024-02-05 ENCOUNTER — Other Ambulatory Visit (HOSPITAL_COMMUNITY): Payer: Self-pay

## 2024-02-05 ENCOUNTER — Other Ambulatory Visit (HOSPITAL_BASED_OUTPATIENT_CLINIC_OR_DEPARTMENT_OTHER): Payer: Self-pay

## 2024-02-05 NOTE — Telephone Encounter (Signed)
 Copied from CRM [id number]. Topic: General - Call Back - No Documentation >> Feb 05, 2024  1:13 PM Gustabo D wrote: Patient says he's returning a call from [facility]

## 2024-02-06 ENCOUNTER — Other Ambulatory Visit (HOSPITAL_COMMUNITY): Payer: Self-pay

## 2024-02-08 ENCOUNTER — Ambulatory Visit (INDEPENDENT_AMBULATORY_CARE_PROVIDER_SITE_OTHER): Payer: Self-pay | Admitting: Nurse Practitioner

## 2024-02-08 ENCOUNTER — Encounter: Payer: Self-pay | Admitting: Nurse Practitioner

## 2024-02-08 VITALS — BP 141/83 | HR 74 | Wt 224.6 lb

## 2024-02-08 DIAGNOSIS — E118 Type 2 diabetes mellitus with unspecified complications: Secondary | ICD-10-CM | POA: Diagnosis not present

## 2024-02-08 DIAGNOSIS — Z1211 Encounter for screening for malignant neoplasm of colon: Secondary | ICD-10-CM | POA: Diagnosis not present

## 2024-02-08 DIAGNOSIS — Z125 Encounter for screening for malignant neoplasm of prostate: Secondary | ICD-10-CM | POA: Diagnosis not present

## 2024-02-08 DIAGNOSIS — Z13220 Encounter for screening for lipoid disorders: Secondary | ICD-10-CM

## 2024-02-08 LAB — POCT GLYCOSYLATED HEMOGLOBIN (HGB A1C): Hemoglobin A1C: 6.1 % — AB (ref 4.0–5.6)

## 2024-02-08 NOTE — Progress Notes (Signed)
 Subjective   Patient ID: Tony Frick., male    DOB: 1965-10-27, 58 y.o.   MRN: [id number]  Chief Complaint  Patient presents with   Medical Management of Chronic Issues    Referring provider: Oley Bascom RAMAN, NP  Mitchell KATHEE Gretta Mickey. is a 57 y.o. male with Past Medical History: No date: Diabetes mellitus without complication (HCC)     Comment:  type 2 No date: Family history of adverse reaction to anesthesia No date: HLD (hyperlipidemia) No date: Syphilis     Comment:  Treated  02/14/2019  HPI  Diabetes Mellitus: Patient presents for follow up of diabetes. Symptoms: hyperglycemia and paresthesia of the feet. Patient denies none.  Evaluation to date has been included: hemoglobin A1C.  Home sugars: patient does not check sugars. Treatment to date: no recent interventions. A1C today 6.1.   Denies any other concerns today. Denies any fatigue, chest pain, shortness of breath, HA or dizziness. Denies any blurred vision, numbness or tingling.    No Known Allergies  Immunization History  Administered Date(s) Administered   Influenza, Seasonal, Injecte, Preservative Fre 06/24/2023   Influenza,inj,Quad PF,6+ Mos 03/14/2021   Moderna Sars-Covid-2 Vaccination 05/14/2020, 07/17/2020   Pneumococcal Polysaccharide-23 03/14/2021    Tobacco History: Social History   Tobacco Use  Smoking Status Every Day   Current packs/day: 0.50   Types: Cigarettes   Passive exposure: Never  Smokeless Tobacco Never  Tobacco Comments   Patch is helping quit-2-5 a week-Been smoking since age 38 yrs old.5/24   Ready to quit: Not Answered Counseling given: Yes Tobacco comments: Patch is helping quit-2-5 a week-Been smoking since age 76 yrs old.5/24   Outpatient Encounter Medications as of 02/08/2024  Medication Sig   acetaminophen  (TYLENOL ) 325 MG tablet Take 2 tablets (650 mg total) by mouth every 6 hours as needed for mild pain or moderate pain.   acetaminophen  (TYLENOL ) 500 MG tablet Take  2 tablets (1,000 mg total) by mouth every 6 hours as needed for moderate pain.   aspirin  EC 81 MG tablet Take 1 tablet (81 mg total) by mouth daily. Swallow whole.   atorvastatin  (LIPITOR) 40 MG tablet Take 1 tablet (40 mg total) by mouth daily.   Blood Glucose Monitoring Suppl (ONE TOUCH ULTRA 2) w/Device KIT Use to check blood sugar in the morning, at noon, and at bedtime.   citalopram  (CELEXA ) 20 MG tablet TAKE ONE TABLET (20 MG TOTAL) BY MOUTH DAILY AT 9AM   clopidogrel  (PLAVIX ) 75 MG tablet Take 1 tablet (75 mg total) by mouth daily.   Continuous Glucose Receiver (DEXCOM G7 RECEIVER) DEVI Use as directed to check blood sugars   ferrous sulfate  (FEROSUL) 325 (65 FE) MG tablet Take 1 tablet (325 mg) by mouth daily with breakfast.   gabapentin  (NEURONTIN ) 300 MG capsule Take 1 capsule (300 mg total) by mouth 2 (two) times daily.   Glucose Blood (BLOOD GLUCOSE TEST STRIPS) STRP Use as directed in the morning, at noon, and at bedtime.   Lancets (ONETOUCH DELICA PLUS LANCET33G) MISC Use as directed in the morning, at noon, and at bedtime.   metFORMIN  (GLUCOPHAGE ) 500 MG tablet Take 1 tablet (500 mg total) by mouth daily with breakfast.   nicotine  (NICODERM CQ  - DOSED IN MG/24 HOURS) 14 mg/24hr patch Place 1 patch (14 mg total) onto the skin daily.   nicotine  polacrilex (NICORETTE ) 4 MG gum Chew 1 piece (4 mg total) as needed for smoking cessation.   omega-3 acid ethyl esters (  LOVAZA ) 1 g capsule TAKE ONE CAPSULE (1 G TOTAL) BY MOUTH TWICE DAILY   pantoprazole  (PROTONIX ) 40 MG tablet Take 1 tablet (40 mg total) by mouth daily.   pantoprazole  (PROTONIX ) 40 MG tablet Take 1 tablet (40 mg total) by mouth daily.   No facility-administered encounter medications on file as of 02/08/2024.    Review of Systems  Review of Systems  Constitutional: Negative.   HENT: Negative.    Cardiovascular: Negative.   Gastrointestinal: Negative.   Allergic/Immunologic: Negative.   Neurological: Negative.    Psychiatric/Behavioral: Negative.       Objective:   BP (!) 141/83   Pulse 74   Wt 224 lb 9.6 oz (101.9 kg)   SpO2 100%   BMI 36.25 kg/m   Wt Readings from Last 5 Encounters:  02/08/24 224 lb 9.6 oz (101.9 kg)  12/08/23 225 lb 6.4 oz (102.2 kg)  09/29/23 236 lb 4.8 oz (107.2 kg)  06/24/23 225 lb 9.6 oz (102.3 kg)  04/07/23 212 lb (96.2 kg)     Physical Exam Vitals and nursing note reviewed.  Constitutional:      General: He is not in acute distress.    Appearance: He is well-developed.  Cardiovascular:     Rate and Rhythm: Normal rate and regular rhythm.  Pulmonary:     Effort: Pulmonary effort is normal.     Breath sounds: Normal breath sounds.  Skin:    General: Skin is warm and dry.  Neurological:     Mental Status: He is alert and oriented to person, place, and time.       Assessment & Plan:   Controlled type 2 diabetes mellitus with complication, without long-term current use of insulin  (HCC) -     POCT glycosylated hemoglobin (Hb A1C) -     Microalbumin / creatinine urine ratio -     CBC -     Comprehensive metabolic panel with GFR  Screening for colon cancer -     Cologuard  Prostate cancer screening -     PSA  Lipid screening -     Lipid panel     Return in about 6 months (around 08/10/2024).   Bascom GORMAN Borer, NP 02/08/2024

## 2024-02-08 NOTE — Patient Instructions (Signed)
 Health Maintenance, Male  Adopting a healthy lifestyle and getting preventive care are important in promoting health and wellness. Ask your health care provider about:  The right schedule for you to have regular tests and exams.  Things you can do on your own to prevent diseases and keep yourself healthy.  What should I know about diet, weight, and exercise?  Eat a healthy diet    Eat a diet that includes plenty of vegetables, fruits, low-fat dairy products, and lean protein.  Do not eat a lot of foods that are high in solid fats, added sugars, or sodium.  Maintain a healthy weight  Body mass index (BMI) is a measurement that can be used to identify possible weight problems. It estimates body fat based on height and weight. Your health care provider can help determine your BMI and help you achieve or maintain a healthy weight.  Get regular exercise  Get regular exercise. This is one of the most important things you can do for your health. Most adults should:  Exercise for at least 150 minutes each week. The exercise should increase your heart rate and make you sweat (moderate-intensity exercise).  Do strengthening exercises at least twice a week. This is in addition to the moderate-intensity exercise.  Spend less time sitting. Even light physical activity can be beneficial.  Watch cholesterol and blood lipids  Have your blood tested for lipids and cholesterol at 57 years of age, then have this test every 5 years.  You may need to have your cholesterol levels checked more often if:  Your lipid or cholesterol levels are high.  You are older than 57 years of age.  You are at high risk for heart disease.  What should I know about cancer screening?  Many types of cancers can be detected early and may often be prevented. Depending on your health history and family history, you may need to have cancer screening at various ages. This may include screening for:  Colorectal cancer.  Prostate cancer.  Skin cancer.  Lung  cancer.  What should I know about heart disease, diabetes, and high blood pressure?  Blood pressure and heart disease  High blood pressure causes heart disease and increases the risk of stroke. This is more likely to develop in people who have high blood pressure readings or are overweight.  Talk with your health care provider about your target blood pressure readings.  Have your blood pressure checked:  Every 3-5 years if you are 9-95 years of age.  Every year if you are 85 years old or older.  If you are between the ages of 29 and 29 and are a current or former smoker, ask your health care provider if you should have a one-time screening for abdominal aortic aneurysm (AAA).  Diabetes  Have regular diabetes screenings. This checks your fasting blood sugar level. Have the screening done:  Once every three years after age 23 if you are at a normal weight and have a low risk for diabetes.  More often and at a younger age if you are overweight or have a high risk for diabetes.  What should I know about preventing infection?  Hepatitis B  If you have a higher risk for hepatitis B, you should be screened for this virus. Talk with your health care provider to find out if you are at risk for hepatitis B infection.  Hepatitis C  Blood testing is recommended for:  Everyone born from [birth year] through 1964.  Anyone  with known risk factors for hepatitis C.  Sexually transmitted infections (STIs)  You should be screened each year for STIs, including gonorrhea and chlamydia, if:  You are sexually active and are younger than 57 years of age.  You are older than 57 years of age and your health care provider tells you that you are at risk for this type of infection.  Your sexual activity has changed since you were last screened, and you are at increased risk for chlamydia or gonorrhea. Ask your health care provider if you are at risk.  Ask your health care provider about whether you are at high risk for HIV. Your health care provider  may recommend a prescription medicine to help prevent HIV infection. If you choose to take medicine to prevent HIV, you should first get tested for HIV. You should then be tested every 3 months for as long as you are taking the medicine.  Follow these instructions at home:  Alcohol use  Do not drink alcohol if your health care provider tells you not to drink.  If you drink alcohol:  Limit how much you have to 0-2 drinks a day.  Know how much alcohol is in your drink. In the [place] one drink equals one 12 oz bottle of beer (355 mL), one 5 oz glass of wine (148 mL), or one 1 oz glass of hard liquor (44 mL).  Lifestyle  Do not use any products that contain nicotine or tobacco. These products include cigarettes, chewing tobacco, and vaping devices, such as e-cigarettes. If you need help quitting, ask your health care provider.  Do not use street drugs.  Do not share needles.  Ask your health care provider for help if you need support or information about quitting drugs.  General instructions  Schedule regular health, dental, and eye exams.  Stay current with your vaccines.  Tell your health care provider if:  You often feel depressed.  You have ever been abused or do not feel safe at home.  Summary  Adopting a healthy lifestyle and getting preventive care are important in promoting health and wellness.  Follow your health care provider's instructions about healthy diet, exercising, and getting tested or screened for diseases.  Follow your health care provider's instructions on monitoring your cholesterol and blood pressure.  This information is not intended to replace advice given to you by your health care provider. Make sure you discuss any questions you have with your health care provider.  Document Revised: 11/12/2020 Document Reviewed: 11/12/2020  Elsevier Patient Education  2024 [employer].

## 2024-02-09 LAB — COMPREHENSIVE METABOLIC PANEL WITH GFR
ALT: 19 IU/L (ref 0–44)
AST: 21 IU/L (ref 0–40)
Albumin: 4.5 g/dL (ref 3.8–4.9)
Alkaline Phosphatase: 86 IU/L (ref 44–121)
BUN/Creatinine Ratio: 11 (ref 9–20)
BUN: 9 mg/dL (ref 6–24)
Bilirubin Total: 0.5 mg/dL (ref 0.0–1.2)
CO2: 22 mmol/L (ref 20–29)
Calcium: 9.7 mg/dL (ref 8.7–10.2)
Chloride: 103 mmol/L (ref 96–106)
Creatinine, Ser: 0.83 mg/dL (ref 0.76–1.27)
Globulin, Total: 2.3 g/dL (ref 1.5–4.5)
Glucose: 118 mg/dL — ABNORMAL HIGH (ref 70–99)
Potassium: 4.2 mmol/L (ref 3.5–5.2)
Sodium: 141 mmol/L (ref 134–144)
Total Protein: 6.8 g/dL (ref 6.0–8.5)
eGFR: 102 mL/min/1.73 (ref >59)

## 2024-02-09 LAB — CBC
Hematocrit: 43.2 % (ref 37.5–51.0)
Hemoglobin: 14.8 g/dL (ref 13.0–17.7)
MCH: 32.2 pg (ref 26.6–33.0)
MCHC: 34.3 g/dL (ref 31.5–35.7)
MCV: 94 fL (ref 79–97)
Platelets: 209 x10E3/uL (ref 150–450)
RBC: 4.59 x10E6/uL (ref 4.14–5.80)
RDW: 13 % (ref 11.6–15.4)
WBC: 4.1 x10E3/uL (ref 3.4–10.8)

## 2024-02-09 LAB — LIPID PANEL
Chol/HDL Ratio: 2.2 ratio (ref 0.0–5.0)
Cholesterol, Total: 189 mg/dL (ref 100–199)
HDL: 87 mg/dL (ref >39)
LDL Chol Calc (NIH): 79 mg/dL (ref 0–99)
Triglycerides: 136 mg/dL (ref 0–149)
VLDL Cholesterol Cal: 23 mg/dL (ref 5–40)

## 2024-02-09 LAB — MICROALBUMIN / CREATININE URINE RATIO
Creatinine, Urine: 160.3 mg/dL
Microalb/Creat Ratio: 9 mg/g{creat} (ref 0–29)
Microalbumin, Urine: 14.3 ug/mL

## 2024-02-09 LAB — PSA: Prostate Specific Ag, Serum: 2.1 ng/mL (ref 0.0–4.0)

## 2024-02-10 ENCOUNTER — Ambulatory Visit: Payer: Self-pay | Admitting: Nurse Practitioner

## 2024-02-11 ENCOUNTER — Other Ambulatory Visit: Payer: Self-pay

## 2024-02-11 ENCOUNTER — Other Ambulatory Visit: Payer: Self-pay | Admitting: Nurse Practitioner

## 2024-02-11 ENCOUNTER — Other Ambulatory Visit (HOSPITAL_COMMUNITY): Payer: Self-pay

## 2024-02-11 MED ORDER — NICOTINE 14 MG/24HR TD PT24
14.0000 mg | MEDICATED_PATCH | TRANSDERMAL | 0 refills | Status: DC
  Filled 2024-02-11 – 2024-02-15 (×3): qty 28, 28d supply, fill #0

## 2024-02-11 NOTE — Telephone Encounter (Signed)
 Please advise [facility]

## 2024-02-12 ENCOUNTER — Other Ambulatory Visit (HOSPITAL_COMMUNITY): Payer: Self-pay

## 2024-02-15 ENCOUNTER — Other Ambulatory Visit: Payer: Self-pay

## 2024-02-15 ENCOUNTER — Other Ambulatory Visit (HOSPITAL_COMMUNITY): Payer: Self-pay

## 2024-02-15 ENCOUNTER — Other Ambulatory Visit: Payer: Self-pay | Admitting: Nurse Practitioner

## 2024-02-15 DIAGNOSIS — E118 Type 2 diabetes mellitus with unspecified complications: Secondary | ICD-10-CM

## 2024-02-15 DIAGNOSIS — D509 Iron deficiency anemia, unspecified: Secondary | ICD-10-CM

## 2024-02-15 MED ORDER — ONETOUCH DELICA PLUS LANCET33G MISC
1.0000 | Freq: Three times a day (TID) | 0 refills | Status: DC
  Filled 2024-02-15 – 2024-03-24 (×6): qty 100, 33d supply, fill #0

## 2024-02-15 MED ORDER — FERROUS SULFATE 325 (65 FE) MG PO TABS
325.0000 mg | ORAL_TABLET | Freq: Every day | ORAL | 0 refills | Status: AC
  Filled 2024-02-15 – 2024-07-30 (×22): qty 120, 120d supply, fill #0

## 2024-02-16 ENCOUNTER — Other Ambulatory Visit (HOSPITAL_COMMUNITY): Payer: Self-pay

## 2024-02-16 ENCOUNTER — Other Ambulatory Visit: Payer: Self-pay

## 2024-02-16 ENCOUNTER — Encounter: Payer: Self-pay | Admitting: Pharmacist

## 2024-02-16 MED ORDER — BLOOD GLUCOSE TEST VI STRP
1.0000 | ORAL_STRIP | Freq: Three times a day (TID) | 2 refills | Status: AC
  Filled 2024-02-16 – 2024-02-21 (×2): qty 600, 200d supply, fill #0
  Filled 2024-02-23: qty 100, 34d supply, fill #0
  Filled 2024-03-07: qty 600, 200d supply, fill #0
  Filled 2024-03-08 – 2024-03-24 (×2): qty 100, 34d supply, fill #0
  Filled 2024-03-25 – 2024-04-20 (×6): qty 100, 34d supply, fill #1
  Filled 2024-04-24 – 2024-05-16 (×5): qty 100, 34d supply, fill #2
  Filled 2024-05-30 – 2024-06-14 (×3): qty 100, 34d supply, fill #3
  Filled 2024-06-21: qty 300, 100d supply, fill #3
  Filled 2024-06-29 – 2024-07-26 (×9): qty 300, 100d supply, fill #4

## 2024-02-17 ENCOUNTER — Other Ambulatory Visit (HOSPITAL_COMMUNITY): Payer: Self-pay

## 2024-02-19 ENCOUNTER — Other Ambulatory Visit: Payer: Self-pay

## 2024-02-21 ENCOUNTER — Other Ambulatory Visit: Payer: Self-pay | Admitting: Nurse Practitioner

## 2024-02-22 ENCOUNTER — Other Ambulatory Visit (HOSPITAL_COMMUNITY): Payer: Self-pay

## 2024-02-22 ENCOUNTER — Other Ambulatory Visit: Payer: Self-pay

## 2024-02-22 MED ORDER — NICOTINE 14 MG/24HR TD PT24
14.0000 mg | MEDICATED_PATCH | TRANSDERMAL | 0 refills | Status: DC
  Filled 2024-02-22 – 2024-03-24 (×5): qty 28, 28d supply, fill #0

## 2024-02-22 NOTE — Telephone Encounter (Signed)
 Please advise [facility]

## 2024-02-23 ENCOUNTER — Encounter: Payer: Self-pay | Admitting: Pharmacist

## 2024-02-23 ENCOUNTER — Other Ambulatory Visit: Payer: Self-pay

## 2024-02-23 ENCOUNTER — Other Ambulatory Visit (HOSPITAL_COMMUNITY): Payer: Self-pay

## 2024-02-24 ENCOUNTER — Other Ambulatory Visit (HOSPITAL_COMMUNITY): Payer: Self-pay

## 2024-02-24 ENCOUNTER — Other Ambulatory Visit: Payer: Self-pay

## 2024-02-25 ENCOUNTER — Other Ambulatory Visit: Payer: Self-pay

## 2024-03-07 ENCOUNTER — Other Ambulatory Visit: Payer: Self-pay

## 2024-03-08 ENCOUNTER — Ambulatory Visit: Attending: Vascular Surgery | Admitting: Physician Assistant

## 2024-03-08 ENCOUNTER — Ambulatory Visit (HOSPITAL_BASED_OUTPATIENT_CLINIC_OR_DEPARTMENT_OTHER)
Admission: RE | Admit: 2024-03-08 | Discharge: 2024-03-08 | Disposition: A | Discharge status: Home / Self Care | Source: Ambulatory Visit | Attending: Physician Assistant | Admitting: Physician Assistant

## 2024-03-08 ENCOUNTER — Ambulatory Visit (HOSPITAL_COMMUNITY)
Admission: RE | Admit: 2024-03-08 | Discharge: 2024-03-08 | Disposition: A | Discharge status: Home / Self Care | Source: Ambulatory Visit | Attending: Physician Assistant | Admitting: Physician Assistant

## 2024-03-08 ENCOUNTER — Other Ambulatory Visit: Payer: Self-pay

## 2024-03-08 ENCOUNTER — Other Ambulatory Visit (HOSPITAL_COMMUNITY): Payer: Self-pay

## 2024-03-08 VITALS — BP 153/89 | HR 78 | Temp 98.0°F | Wt 217.5 lb

## 2024-03-08 DIAGNOSIS — I739 Peripheral vascular disease, unspecified: Secondary | ICD-10-CM | POA: Diagnosis present

## 2024-03-08 DIAGNOSIS — I70322 Atherosclerosis of unspecified type of bypass graft(s) of the extremities with rest pain, left leg: Secondary | ICD-10-CM

## 2024-03-08 DIAGNOSIS — F17200 Nicotine dependence, unspecified, uncomplicated: Secondary | ICD-10-CM | POA: Insufficient documentation

## 2024-03-08 DIAGNOSIS — Z95820 Peripheral vascular angioplasty status with implants and grafts: Secondary | ICD-10-CM | POA: Insufficient documentation

## 2024-03-08 LAB — VAS US ABI WITH/WO TBI
Left ABI: 0.65
Right ABI: 0.84

## 2024-03-08 NOTE — Progress Notes (Signed)
 Office Note     CC:  follow up Requesting Provider:  Oley Bascom RAMAN, NP  HPI: Tony Hicks. is a 58 y.o. (1965-10-04) male who presents for surveillance of PAD.  He has extensive history of left lower extremity interventions including left above-the-knee popliteal to posterior tibial bypass with vein on 01/14/2021.  His bypass occluded and underwent redo bypass with PTFE on 10/08/2022.  This became infected and he underwent excision with placement of cryo bypass on 10/08/2022.  This subsequently thrombosed and he underwent recannulization of the native system using retrograde tibial access on 11/20/2022.  He also underwent shockwave lithotripsy of the popliteal artery behind the knee with popliteal stent on 01/01/2023.  He denies any claudication, rest pain, or tissue loss of the left lower extremity.  He continues to be on aspirin , Plavix , statin daily.  He is still smoking however trying to quit and using patches currently.   Past Medical History:  Diagnosis Date   Diabetes mellitus without complication (HCC)    type 2   Family history of adverse reaction to anesthesia    HLD (hyperlipidemia)    Syphilis    Treated  02/14/2019    Past Surgical History:  Procedure Laterality Date   ABDOMINAL AORTOGRAM W/LOWER EXTREMITY N/A 01/11/2021   Procedure: ABDOMINAL AORTOGRAM W/LOWER EXTREMITY;  Surgeon: Magda Debby SAILOR, MD;  Location: [facility];  Service: Cardiovascular;  Laterality: N/A;   ABDOMINAL AORTOGRAM W/LOWER EXTREMITY N/A 10/24/2021   Procedure: ABDOMINAL AORTOGRAM W/LOWER EXTREMITY;  Surgeon: Gretta Lonni PARAS, MD;  Location: [facility];  Service: Cardiovascular;  Laterality: N/A;   ABDOMINAL AORTOGRAM W/LOWER EXTREMITY Bilateral 09/25/2022   Procedure: ABDOMINAL AORTOGRAM W/LOWER EXTREMITY;  Surgeon: Gretta Lonni PARAS, MD;  Location: [facility];  Service: Cardiovascular;  Laterality: Bilateral;   ABDOMINAL AORTOGRAM W/LOWER EXTREMITY N/A 01/01/2023    Procedure: ABDOMINAL AORTOGRAM W/LOWER EXTREMITY;  Surgeon: Gretta Lonni PARAS, MD;  Location: [facility];  Service: Cardiovascular;  Laterality: N/A;   FEMORAL-POPLITEAL BYPASS GRAFT Left 10/22/2022   Procedure: LEFT LEG REDO SUPERFICAL SAPHENOUS VEIN TO CRYO SAPHENOUS VEIN BYPASS W/ GRAFT EXCISION;  Surgeon: Magda Debby SAILOR, MD;  Location: [facility];  Service: Vascular;  Laterality: Left;   FEMORAL-TIBIAL BYPASS GRAFT Left 10/08/2022   Procedure: REDO LEFT SUPERFICIAL FEMORAL ARTERY-POSTERIOR TIBIAL BYPASS WITH PTFE;  Surgeon: Gretta Lonni PARAS, MD;  Location: [facility] OR;  Service: Vascular;  Laterality: Left;   LOWER EXTREMITY ANGIOGRAM Left 10/08/2022   Procedure: LOWER LEFT EXTREMITY ANGIOGRAM;  Surgeon: Gretta Lonni PARAS, MD;  Location: [facility];  Service: Vascular;  Laterality: Left;   LOWER EXTREMITY ANGIOGRAPHY  11/20/2022   Procedure: Lower Extremity Angiography;  Surgeon: Gretta Lonni PARAS, MD;  Location: [facility];  Service: Cardiovascular;;   PERIPHERAL VASCULAR BALLOON ANGIOPLASTY  10/24/2021   Procedure: PERIPHERAL VASCULAR BALLOON ANGIOPLASTY;  Surgeon: Gretta Lonni PARAS, MD;  Location: [facility];  Service: Cardiovascular;;  Left SFA and Left Pop Bypass   PERIPHERAL VASCULAR BALLOON ANGIOPLASTY  01/01/2023   Procedure: PERIPHERAL VASCULAR BALLOON ANGIOPLASTY;  Surgeon: Gretta Lonni PARAS, MD;  Location: [facility];  Service: Cardiovascular;;  lt popliteal - lithotripsy   PERIPHERAL VASCULAR INTERVENTION  11/20/2022   Procedure: PERIPHERAL VASCULAR INTERVENTION;  Surgeon: Gretta Lonni PARAS, MD;  Location: [facility];  Service: Cardiovascular;;   THROMBECTOMY OF BYPASS GRAFT FEMORAL- TIBIAL ARTERY Left 01/14/2021   Procedure: LEFT ABOVE KNEE TO  POSTERIOR TIBIAL BYPASS  GRAFT WITH GREATER SAPHENOUS VEIN;  Surgeon: Gretta Lonni PARAS, MD;  Location: [facility] OR;  Service: Vascular;  Laterality: Left;    Social History   Socioeconomic History    Marital status: Single    Spouse name: Not on file   Number of children: Not on file   Years of education: Not on file   Highest education level: Not on file  Occupational History   Occupation: Holiday representative  Tobacco Use   Smoking status: Every Day    Current packs/day: 0.50    Types: Cigarettes    Passive exposure: Never   Smokeless tobacco: Never   Tobacco comments:    Patch is helping quit-2-5 a week-Been smoking since age 74 yrs old.5/24  Vaping Use   Vaping status: Never Used  Substance and Sexual Activity   Alcohol  use: Yes    Alcohol /week: 20.0 standard drinks of alcohol     Types: 20 Standard drinks or equivalent per week    Comment: Approx 20-25 beers week, cut back now drinking 12 pk as of 10/03/22   Drug use: Not Currently    Frequency: 1.0 times per week    Types: Cocaine, Marijuana    Comment: smoke crack   Sexual activity: Yes    Birth control/protection: Condom  Other Topics Concern   Not on file  Social History Narrative   ** Merged History Encounter **       Social Drivers of Corporate investment banker Strain: Not on file  Food Insecurity: Food Insecurity Present (02/08/2024)   Hunger Vital Sign    Worried About Running Out of Food in the Last Year: Sometimes true    Ran Out of Food in the Last Year: Sometimes true  Transportation Needs: Unmet Transportation Needs (02/08/2024)   PRAPARE - Administrator, Civil Service (Medical): Yes    Lack of Transportation (Non-Medical): Yes  Physical Activity: Not on file  Stress: Not on file  Social Connections: Not on file  Intimate Partner Violence: Not At Risk (10/24/2022)   Humiliation, Afraid, Rape, and Kick questionnaire    Fear of Current or Ex-Partner: No    Emotionally Abused: No    Physically Abused: No    Sexually Abused: No    Family History  Family history unknown: Yes    Current Outpatient Medications  Medication Sig Dispense Refill   acetaminophen  (TYLENOL ) 325 MG tablet Take 2  tablets (650 mg total) by mouth every 6 hours as needed for mild pain or moderate pain. 90 tablet 2   acetaminophen  (TYLENOL ) 500 MG tablet Take 2 tablets (1,000 mg total) by mouth every 6 hours as needed for moderate pain. 30 tablet 2   aspirin  EC 81 MG tablet Take 1 tablet (81 mg total) by mouth daily. Swallow whole. 30 tablet 11   atorvastatin  (LIPITOR) 40 MG tablet Take 1 tablet (40 mg total) by mouth daily. 90 tablet 1   Blood Glucose Monitoring Suppl (ONE TOUCH ULTRA 2) w/Device KIT Use to check blood sugar in the morning, at noon, and at bedtime. 1 kit 0   citalopram  (CELEXA ) 20 MG tablet TAKE ONE TABLET (20 MG TOTAL) BY MOUTH DAILY AT 9AM 30 tablet 11   clopidogrel  (PLAVIX ) 75 MG tablet Take 1 tablet (75 mg total) by mouth daily. 90 tablet 3   Continuous Glucose Receiver (DEXCOM G7 RECEIVER) DEVI Use as directed to check blood sugars 1 each 0   ferrous sulfate  (FEROSUL) 325 (65 FE) MG tablet Take 1  tablet (325 mg) by mouth daily with breakfast. 120 tablet 0   gabapentin  (NEURONTIN ) 300 MG capsule Take 1 capsule (300 mg total) by mouth 2 (two) times daily. 180 capsule 11   Glucose Blood (BLOOD GLUCOSE TEST STRIPS) STRP Use as directed in the morning, at noon, and at bedtime. 600 strip 2   Lancets (ONETOUCH DELICA PLUS LANCET33G) MISC Use as directed in the morning, at noon, and at bedtime. 100 each 0   metFORMIN  (GLUCOPHAGE ) 500 MG tablet Take 1 tablet (500 mg total) by mouth daily with breakfast. 90 tablet 1   nicotine  (NICODERM CQ  - DOSED IN MG/24 HOURS) 14 mg/24hr patch Place 1 patch (14 mg total) onto the skin daily. 28 patch 0   nicotine  polacrilex (NICORETTE ) 4 MG gum Chew 1 piece (4 mg total) as needed for smoking cessation. 100 tablet 0   omega-3 acid ethyl esters (LOVAZA ) 1 g capsule TAKE ONE CAPSULE (1 G TOTAL) BY MOUTH TWICE DAILY 60 capsule 11   pantoprazole  (PROTONIX ) 40 MG tablet Take 1 tablet (40 mg total) by mouth daily. 90 tablet 1   pantoprazole  (PROTONIX ) 40 MG tablet Take  1 tablet (40 mg total) by mouth daily. 90 tablet 1   No current facility-administered medications for this visit.    No Known Allergies   REVIEW OF SYSTEMS:  Negative unless noted in HPI [X]  denotes positive finding, [ ]  denotes negative finding Cardiac  Comments:  Chest pain or chest pressure:    Shortness of breath upon exertion:    Short of breath when lying flat:    Irregular heart rhythm:        Vascular    Pain in calf, thigh, or hip brought on by ambulation:    Pain in feet at night that wakes you up from your sleep:     Blood clot in your veins:    Leg swelling:         Pulmonary    Oxygen at home:    Productive cough:     Wheezing:         Neurologic    Sudden weakness in arms or legs:     Sudden numbness in arms or legs:     Sudden onset of difficulty speaking or slurred speech:    Temporary loss of vision in one eye:     Problems with dizziness:         Gastrointestinal    Blood in stool:     Vomited blood:         Genitourinary    Burning when urinating:     Blood in urine:        Psychiatric    Major depression:         Hematologic    Bleeding problems:    Problems with blood clotting too easily:        Skin    Rashes or ulcers:        Constitutional    Fever or chills:      PHYSICAL EXAMINATION:  Vitals:   03/08/24 1237  BP: (!) 153/89  Pulse: 78  Temp: 98 F (36.7 C)  TempSrc: Temporal  Weight: 217 lb 8 oz (98.7 kg)    General:  WDWN in NAD; vital signs documented above Gait: Not observed HENT: WNL, normocephalic Pulmonary: normal non-labored breathing Cardiac: regular HR Abdomen: soft, NT, no masses Skin: without rashes Vascular Exam/Pulses: palpable L DP 2+ Extremities: without ischemic changes, without Gangrene , without cellulitis;  without open wounds;  Musculoskeletal: no muscle wasting or atrophy  Neurologic: A&O X 3 Psychiatric:  The pt has Normal affect.   Non-Invasive Vascular Imaging:   Patent left  SFA/popliteal stents  ABI/TBIToday's ABI            Today's TBIPrevious ABIPrevious TBI  +-------+-----------------------+-----------+------------+------------+  Right 0.84 (likely calcified)0.36       0.91        0.17          +-------+-----------------------+-----------+------------+------------+  Left  0.65                   0.47       0.77        0.43           ASSESSMENT/PLAN:: 58 y.o. male here for follow up for surveillance of PAD with extensive surgical history involving the left lower extremity  Left lower extremity is well-perfused with a palpable DP pulse.  Duplex demonstrates widely patent SFA/popliteal stents.  He will continue his aspirin , Plavix , statin daily.  I asked him to walk every day for exercise.  I also strongly encouraged smoking cessation.  We will repeat studies in 3 months.  He knows to call/turn office sooner with any questions or concerns.   Donnice Sender, PA-C [facility] [phone]  Clinic MD:   Magda

## 2024-03-09 ENCOUNTER — Other Ambulatory Visit: Payer: Self-pay

## 2024-03-10 ENCOUNTER — Other Ambulatory Visit: Payer: Self-pay | Admitting: *Deleted

## 2024-03-10 ENCOUNTER — Other Ambulatory Visit: Payer: Self-pay

## 2024-03-10 ENCOUNTER — Encounter: Payer: Self-pay | Admitting: Pharmacist

## 2024-03-10 DIAGNOSIS — I70229 Atherosclerosis of native arteries of extremities with rest pain, unspecified extremity: Secondary | ICD-10-CM

## 2024-03-10 DIAGNOSIS — I739 Peripheral vascular disease, unspecified: Secondary | ICD-10-CM

## 2024-03-10 DIAGNOSIS — I70322 Atherosclerosis of unspecified type of bypass graft(s) of the extremities with rest pain, left leg: Secondary | ICD-10-CM

## 2024-03-15 ENCOUNTER — Other Ambulatory Visit: Payer: Self-pay

## 2024-03-24 ENCOUNTER — Other Ambulatory Visit (HOSPITAL_COMMUNITY): Payer: Self-pay

## 2024-03-24 ENCOUNTER — Other Ambulatory Visit: Payer: Self-pay

## 2024-03-25 ENCOUNTER — Other Ambulatory Visit (HOSPITAL_COMMUNITY): Payer: Self-pay

## 2024-03-25 ENCOUNTER — Other Ambulatory Visit: Payer: Self-pay

## 2024-03-25 ENCOUNTER — Encounter: Payer: Self-pay | Admitting: Pharmacist

## 2024-03-29 ENCOUNTER — Other Ambulatory Visit (HOSPITAL_COMMUNITY): Payer: Self-pay

## 2024-03-30 ENCOUNTER — Other Ambulatory Visit (HOSPITAL_COMMUNITY): Payer: Self-pay

## 2024-03-30 ENCOUNTER — Other Ambulatory Visit: Payer: Self-pay | Admitting: Nurse Practitioner

## 2024-03-30 ENCOUNTER — Other Ambulatory Visit: Payer: Self-pay

## 2024-03-30 DIAGNOSIS — E118 Type 2 diabetes mellitus with unspecified complications: Secondary | ICD-10-CM

## 2024-03-31 ENCOUNTER — Other Ambulatory Visit (HOSPITAL_COMMUNITY): Payer: Self-pay

## 2024-03-31 ENCOUNTER — Other Ambulatory Visit: Payer: Self-pay

## 2024-03-31 MED ORDER — NICOTINE 14 MG/24HR TD PT24
14.0000 mg | MEDICATED_PATCH | TRANSDERMAL | 0 refills | Status: DC
  Filled 2024-03-31 – 2024-04-26 (×4): qty 28, 28d supply, fill #0

## 2024-03-31 MED ORDER — ONETOUCH DELICA PLUS LANCET33G MISC
1.0000 | Freq: Three times a day (TID) | 0 refills | Status: DC
  Filled 2024-03-31 – 2024-04-20 (×3): qty 100, 33d supply, fill #0

## 2024-04-04 ENCOUNTER — Other Ambulatory Visit: Payer: Self-pay | Admitting: Nurse Practitioner

## 2024-04-04 DIAGNOSIS — Z720 Tobacco use: Secondary | ICD-10-CM

## 2024-04-05 ENCOUNTER — Other Ambulatory Visit (HOSPITAL_COMMUNITY): Payer: Self-pay

## 2024-04-05 ENCOUNTER — Other Ambulatory Visit: Payer: Self-pay

## 2024-04-05 NOTE — Telephone Encounter (Signed)
 Rx for Nicotine  patches sent 03/31/24 for 28 days.   Please advise if also ok to send in gum. Last ordered 11/09/23 #100 0 refills

## 2024-04-06 ENCOUNTER — Other Ambulatory Visit (HOSPITAL_COMMUNITY): Payer: Self-pay

## 2024-04-06 MED ORDER — NICOTINE POLACRILEX 4 MG MT GUM
4.0000 mg | CHEWING_GUM | OROMUCOSAL | 0 refills | Status: DC | PRN
  Filled 2024-04-06 – 2024-04-26 (×4): qty 100, 30d supply, fill #0

## 2024-04-08 ENCOUNTER — Other Ambulatory Visit: Payer: Self-pay

## 2024-04-13 ENCOUNTER — Other Ambulatory Visit: Payer: Self-pay

## 2024-04-13 ENCOUNTER — Other Ambulatory Visit (HOSPITAL_COMMUNITY): Payer: Self-pay

## 2024-04-19 ENCOUNTER — Other Ambulatory Visit: Payer: Self-pay

## 2024-04-19 ENCOUNTER — Other Ambulatory Visit (HOSPITAL_COMMUNITY): Payer: Self-pay

## 2024-04-20 ENCOUNTER — Encounter: Payer: Self-pay | Admitting: Pharmacist

## 2024-04-20 ENCOUNTER — Other Ambulatory Visit: Payer: Self-pay

## 2024-04-24 ENCOUNTER — Other Ambulatory Visit: Payer: Self-pay | Admitting: Nurse Practitioner

## 2024-04-24 DIAGNOSIS — E118 Type 2 diabetes mellitus with unspecified complications: Secondary | ICD-10-CM

## 2024-04-25 ENCOUNTER — Other Ambulatory Visit: Payer: Self-pay

## 2024-04-25 ENCOUNTER — Other Ambulatory Visit (HOSPITAL_COMMUNITY): Payer: Self-pay

## 2024-04-25 MED ORDER — ONETOUCH DELICA PLUS LANCET33G MISC
1.0000 | Freq: Three times a day (TID) | 0 refills | Status: DC
  Filled 2024-04-25 – 2024-05-13 (×4): qty 100, 33d supply, fill #0

## 2024-04-26 ENCOUNTER — Other Ambulatory Visit (HOSPITAL_COMMUNITY): Payer: Self-pay

## 2024-04-26 ENCOUNTER — Other Ambulatory Visit: Payer: Self-pay

## 2024-04-26 ENCOUNTER — Telehealth: Payer: Self-pay

## 2024-04-26 ENCOUNTER — Other Ambulatory Visit: Payer: Self-pay | Admitting: Nurse Practitioner

## 2024-04-26 DIAGNOSIS — E118 Type 2 diabetes mellitus with unspecified complications: Secondary | ICD-10-CM

## 2024-04-26 NOTE — Telephone Encounter (Signed)
 Copied from CRM [id number]. Topic: Clinical - Medication Question >> Apr 26, 2024  1:53 PM Rosaria BRAVO wrote: Reason for CRM: Has questions about his BP medications. Best contact: [phone]                             Best cntact: [phone]

## 2024-04-27 ENCOUNTER — Other Ambulatory Visit: Payer: Self-pay

## 2024-04-27 ENCOUNTER — Other Ambulatory Visit (HOSPITAL_COMMUNITY): Payer: Self-pay

## 2024-04-27 MED ORDER — ACCU-CHEK GUIDE W/DEVICE KIT
1.0000 | PACK | Freq: Three times a day (TID) | 0 refills | Status: DC
  Filled 2024-04-27 – 2024-06-09 (×8): qty 1, 30d supply, fill #0

## 2024-04-28 ENCOUNTER — Other Ambulatory Visit (HOSPITAL_COMMUNITY): Payer: Self-pay

## 2024-04-28 NOTE — Telephone Encounter (Signed)
 LMOM asking what question he has about his medication.

## 2024-05-11 ENCOUNTER — Other Ambulatory Visit: Payer: Self-pay | Admitting: Nurse Practitioner

## 2024-05-11 DIAGNOSIS — Z720 Tobacco use: Secondary | ICD-10-CM

## 2024-05-12 ENCOUNTER — Other Ambulatory Visit (HOSPITAL_COMMUNITY): Payer: Self-pay

## 2024-05-12 ENCOUNTER — Other Ambulatory Visit: Payer: Self-pay

## 2024-05-12 MED ORDER — NICOTINE POLACRILEX 4 MG MT GUM
4.0000 mg | CHEWING_GUM | OROMUCOSAL | 0 refills | Status: DC | PRN
  Filled 2024-05-12 – 2024-06-29 (×3): qty 100, 30d supply, fill #0

## 2024-05-12 MED ORDER — NICOTINE 14 MG/24HR TD PT24
14.0000 mg | MEDICATED_PATCH | TRANSDERMAL | 0 refills | Status: DC
  Filled 2024-05-12 – 2024-05-16 (×2): qty 28, 28d supply, fill #0

## 2024-05-12 NOTE — Telephone Encounter (Signed)
 Please advise [facility]

## 2024-05-13 ENCOUNTER — Other Ambulatory Visit (HOSPITAL_COMMUNITY): Payer: Self-pay

## 2024-05-14 ENCOUNTER — Other Ambulatory Visit (HOSPITAL_COMMUNITY): Payer: Self-pay

## 2024-05-16 ENCOUNTER — Other Ambulatory Visit: Payer: Self-pay

## 2024-05-17 ENCOUNTER — Other Ambulatory Visit (HOSPITAL_COMMUNITY): Payer: Self-pay

## 2024-05-17 ENCOUNTER — Other Ambulatory Visit: Payer: Self-pay

## 2024-05-18 ENCOUNTER — Other Ambulatory Visit: Payer: Self-pay

## 2024-05-21 ENCOUNTER — Other Ambulatory Visit: Payer: Self-pay | Admitting: Nurse Practitioner

## 2024-05-23 ENCOUNTER — Other Ambulatory Visit: Payer: Self-pay

## 2024-05-30 ENCOUNTER — Other Ambulatory Visit: Payer: Self-pay

## 2024-05-30 ENCOUNTER — Other Ambulatory Visit (HOSPITAL_COMMUNITY): Payer: Self-pay

## 2024-05-31 ENCOUNTER — Other Ambulatory Visit: Payer: Self-pay

## 2024-06-01 ENCOUNTER — Other Ambulatory Visit (HOSPITAL_COMMUNITY): Payer: Self-pay

## 2024-06-03 ENCOUNTER — Other Ambulatory Visit: Payer: Self-pay

## 2024-06-07 ENCOUNTER — Other Ambulatory Visit: Payer: Self-pay

## 2024-06-07 ENCOUNTER — Ambulatory Visit (HOSPITAL_COMMUNITY)

## 2024-06-07 ENCOUNTER — Encounter: Payer: Self-pay | Admitting: Pharmacist

## 2024-06-07 ENCOUNTER — Ambulatory Visit

## 2024-06-07 ENCOUNTER — Ambulatory Visit (HOSPITAL_COMMUNITY): Admission: RE | Admit: 2024-06-07 | Source: Ambulatory Visit

## 2024-06-07 ENCOUNTER — Other Ambulatory Visit: Payer: Self-pay | Admitting: Nurse Practitioner

## 2024-06-07 DIAGNOSIS — K219 Gastro-esophageal reflux disease without esophagitis: Secondary | ICD-10-CM

## 2024-06-07 DIAGNOSIS — E118 Type 2 diabetes mellitus with unspecified complications: Secondary | ICD-10-CM

## 2024-06-08 ENCOUNTER — Other Ambulatory Visit: Payer: Self-pay

## 2024-06-08 MED ORDER — ONETOUCH DELICA PLUS LANCET33G MISC
1.0000 | Freq: Three times a day (TID) | 0 refills | Status: DC
  Filled 2024-06-08 – 2024-07-04 (×5): qty 100, 33d supply, fill #0

## 2024-06-08 MED ORDER — ATORVASTATIN CALCIUM 40 MG PO TABS
40.0000 mg | ORAL_TABLET | Freq: Every day | ORAL | 1 refills | Status: AC
  Filled 2024-06-08 – 2024-07-30 (×15): qty 90, 90d supply, fill #0

## 2024-06-08 MED ORDER — METFORMIN HCL 500 MG PO TABS
500.0000 mg | ORAL_TABLET | Freq: Every day | ORAL | 1 refills | Status: AC
  Filled 2024-06-08 – 2024-07-21 (×4): qty 90, 90d supply, fill #0

## 2024-06-08 MED ORDER — PANTOPRAZOLE SODIUM 40 MG PO TBEC
40.0000 mg | DELAYED_RELEASE_TABLET | Freq: Every day | ORAL | 1 refills | Status: AC
  Filled 2024-06-08 – 2024-07-30 (×12): qty 90, 90d supply, fill #0

## 2024-06-09 ENCOUNTER — Other Ambulatory Visit: Payer: Self-pay

## 2024-06-09 ENCOUNTER — Other Ambulatory Visit (HOSPITAL_COMMUNITY): Payer: Self-pay

## 2024-06-10 ENCOUNTER — Other Ambulatory Visit (HOSPITAL_COMMUNITY): Payer: Self-pay

## 2024-06-10 ENCOUNTER — Other Ambulatory Visit: Payer: Self-pay | Admitting: Nurse Practitioner

## 2024-06-10 DIAGNOSIS — E118 Type 2 diabetes mellitus with unspecified complications: Secondary | ICD-10-CM

## 2024-06-10 MED ORDER — ACCU-CHEK GUIDE W/DEVICE KIT
1.0000 | PACK | Freq: Three times a day (TID) | 0 refills | Status: AC
  Filled 2024-06-10 – 2024-07-04 (×6): qty 1, 30d supply, fill #0

## 2024-06-11 ENCOUNTER — Other Ambulatory Visit (HOSPITAL_COMMUNITY): Payer: Self-pay

## 2024-06-12 ENCOUNTER — Other Ambulatory Visit (HOSPITAL_COMMUNITY): Payer: Self-pay

## 2024-06-13 ENCOUNTER — Other Ambulatory Visit (HOSPITAL_COMMUNITY): Payer: Self-pay

## 2024-06-14 ENCOUNTER — Other Ambulatory Visit: Payer: Self-pay

## 2024-06-15 ENCOUNTER — Other Ambulatory Visit (HOSPITAL_COMMUNITY): Payer: Self-pay

## 2024-06-15 ENCOUNTER — Encounter: Payer: Self-pay | Admitting: Pharmacist

## 2024-06-15 ENCOUNTER — Other Ambulatory Visit: Payer: Self-pay

## 2024-06-17 ENCOUNTER — Other Ambulatory Visit: Payer: Self-pay

## 2024-06-20 ENCOUNTER — Other Ambulatory Visit: Payer: Self-pay

## 2024-06-21 ENCOUNTER — Other Ambulatory Visit (HOSPITAL_COMMUNITY): Payer: Self-pay

## 2024-06-21 ENCOUNTER — Other Ambulatory Visit: Payer: Self-pay

## 2024-06-21 ENCOUNTER — Ambulatory Visit (HOSPITAL_COMMUNITY)
Admission: RE | Admit: 2024-06-21 | Discharge: 2024-06-21 | Disposition: A | Source: Ambulatory Visit | Attending: Physician Assistant

## 2024-06-21 ENCOUNTER — Ambulatory Visit (HOSPITAL_COMMUNITY): Admission: RE | Admit: 2024-06-21 | Discharge: 2024-06-21 | Attending: Physician Assistant

## 2024-06-21 DIAGNOSIS — I739 Peripheral vascular disease, unspecified: Secondary | ICD-10-CM | POA: Diagnosis present

## 2024-06-21 DIAGNOSIS — I70322 Atherosclerosis of unspecified type of bypass graft(s) of the extremities with rest pain, left leg: Secondary | ICD-10-CM | POA: Insufficient documentation

## 2024-06-21 DIAGNOSIS — I70229 Atherosclerosis of native arteries of extremities with rest pain, unspecified extremity: Secondary | ICD-10-CM | POA: Diagnosis present

## 2024-06-21 LAB — VAS US ABI WITH/WO TBI
Left ABI: 0.51
Right ABI: 0.9

## 2024-06-29 ENCOUNTER — Other Ambulatory Visit: Payer: Self-pay | Admitting: Nurse Practitioner

## 2024-07-01 ENCOUNTER — Other Ambulatory Visit: Payer: Self-pay

## 2024-07-01 ENCOUNTER — Other Ambulatory Visit (HOSPITAL_COMMUNITY): Payer: Self-pay

## 2024-07-01 MED ORDER — NICOTINE 14 MG/24HR TD PT24
14.0000 mg | MEDICATED_PATCH | TRANSDERMAL | 0 refills | Status: AC
  Filled 2024-07-01: qty 28, 28d supply, fill #0

## 2024-07-04 ENCOUNTER — Other Ambulatory Visit: Payer: Self-pay | Admitting: Nurse Practitioner

## 2024-07-04 ENCOUNTER — Other Ambulatory Visit (HOSPITAL_COMMUNITY): Payer: Self-pay

## 2024-07-04 DIAGNOSIS — Z720 Tobacco use: Secondary | ICD-10-CM

## 2024-07-05 ENCOUNTER — Other Ambulatory Visit: Payer: Self-pay

## 2024-07-05 MED ORDER — NICOTINE POLACRILEX 4 MG MT GUM
4.0000 mg | CHEWING_GUM | OROMUCOSAL | 0 refills | Status: AC | PRN
  Filled 2024-07-05 – 2024-07-30 (×5): qty 100, 30d supply, fill #0

## 2024-07-05 NOTE — Telephone Encounter (Signed)
 Please advise [facility]

## 2024-07-05 NOTE — Telephone Encounter (Signed)
 nicotine  polacrilex (NICORETTE ) 4 MG gum [[id number]]

## 2024-07-06 ENCOUNTER — Other Ambulatory Visit (HOSPITAL_COMMUNITY): Payer: Self-pay

## 2024-07-08 ENCOUNTER — Other Ambulatory Visit: Payer: Self-pay | Admitting: Nurse Practitioner

## 2024-07-08 ENCOUNTER — Other Ambulatory Visit: Payer: Self-pay

## 2024-07-08 DIAGNOSIS — E118 Type 2 diabetes mellitus with unspecified complications: Secondary | ICD-10-CM

## 2024-07-11 MED ORDER — ACCU-CHEK GUIDE W/DEVICE KIT
1.0000 | PACK | Freq: Three times a day (TID) | 0 refills | Status: AC
  Filled 2024-07-11 – 2024-07-26 (×5): qty 1, 30d supply, fill #0
  Filled 2024-07-30: qty 1, 90d supply, fill #0

## 2024-07-11 NOTE — Telephone Encounter (Signed)
 Blood Glucose Monitoring Suppl (ACCU-CHEK GUIDE) w/Device KIT

## 2024-07-13 ENCOUNTER — Other Ambulatory Visit: Payer: Self-pay | Admitting: Nurse Practitioner

## 2024-07-13 ENCOUNTER — Other Ambulatory Visit (HOSPITAL_COMMUNITY): Payer: Self-pay

## 2024-07-13 ENCOUNTER — Other Ambulatory Visit: Payer: Self-pay

## 2024-07-13 DIAGNOSIS — E118 Type 2 diabetes mellitus with unspecified complications: Secondary | ICD-10-CM

## 2024-07-14 ENCOUNTER — Other Ambulatory Visit: Payer: Self-pay

## 2024-07-14 ENCOUNTER — Other Ambulatory Visit (HOSPITAL_COMMUNITY): Payer: Self-pay

## 2024-07-14 MED ORDER — ONETOUCH DELICA PLUS LANCET33G MISC
1.0000 | Freq: Three times a day (TID) | 0 refills | Status: AC
  Filled 2024-07-14 – 2024-07-29 (×7): qty 100, 33d supply, fill #0

## 2024-07-14 NOTE — Telephone Encounter (Signed)
 Lancets (ONETOUCH DELICA PLUS LANCET33G) MISC

## 2024-07-15 ENCOUNTER — Other Ambulatory Visit: Payer: Self-pay

## 2024-07-16 ENCOUNTER — Other Ambulatory Visit (HOSPITAL_COMMUNITY): Payer: Self-pay

## 2024-07-18 ENCOUNTER — Other Ambulatory Visit (HOSPITAL_BASED_OUTPATIENT_CLINIC_OR_DEPARTMENT_OTHER): Payer: Self-pay

## 2024-07-19 ENCOUNTER — Other Ambulatory Visit: Payer: Self-pay

## 2024-07-19 ENCOUNTER — Other Ambulatory Visit (HOSPITAL_COMMUNITY): Payer: Self-pay

## 2024-07-19 ENCOUNTER — Other Ambulatory Visit: Payer: Self-pay | Admitting: Nurse Practitioner

## 2024-07-20 ENCOUNTER — Other Ambulatory Visit: Payer: Self-pay

## 2024-07-21 ENCOUNTER — Other Ambulatory Visit: Payer: Self-pay

## 2024-07-21 ENCOUNTER — Other Ambulatory Visit (HOSPITAL_COMMUNITY): Payer: Self-pay

## 2024-07-22 ENCOUNTER — Other Ambulatory Visit: Payer: Self-pay

## 2024-07-23 ENCOUNTER — Other Ambulatory Visit (HOSPITAL_COMMUNITY): Payer: Self-pay

## 2024-07-27 ENCOUNTER — Other Ambulatory Visit: Payer: Self-pay

## 2024-07-27 ENCOUNTER — Other Ambulatory Visit (HOSPITAL_COMMUNITY): Payer: Self-pay

## 2024-07-29 ENCOUNTER — Other Ambulatory Visit: Payer: Self-pay

## 2024-07-30 ENCOUNTER — Other Ambulatory Visit (HOSPITAL_COMMUNITY): Payer: Self-pay

## 2024-07-30 ENCOUNTER — Other Ambulatory Visit: Payer: Self-pay | Admitting: Nurse Practitioner

## 2024-07-30 DIAGNOSIS — E118 Type 2 diabetes mellitus with unspecified complications: Secondary | ICD-10-CM

## 2024-07-31 ENCOUNTER — Other Ambulatory Visit: Payer: Self-pay

## 2024-07-31 ENCOUNTER — Other Ambulatory Visit (HOSPITAL_COMMUNITY): Payer: Self-pay

## 2024-08-01 ENCOUNTER — Other Ambulatory Visit: Payer: Self-pay

## 2024-08-01 ENCOUNTER — Other Ambulatory Visit (HOSPITAL_COMMUNITY): Payer: Self-pay

## 2024-08-01 MED ORDER — ONETOUCH DELICA PLUS LANCET33G MISC
1.0000 | Freq: Three times a day (TID) | 0 refills | Status: AC
  Filled 2024-08-01: qty 100, 33d supply, fill #0

## 2024-08-02 ENCOUNTER — Other Ambulatory Visit (HOSPITAL_COMMUNITY): Payer: Self-pay

## 2024-08-10 ENCOUNTER — Ambulatory Visit: Payer: Self-pay | Admitting: Nurse Practitioner

## 2024-08-11 ENCOUNTER — Other Ambulatory Visit: Payer: Self-pay | Admitting: Nurse Practitioner

## 2024-08-11 DIAGNOSIS — K219 Gastro-esophageal reflux disease without esophagitis: Secondary | ICD-10-CM

## 2024-08-12 NOTE — Telephone Encounter (Signed)
 Please call to make pt an appt. Thank you. KH

## 2024-08-19 ENCOUNTER — Ambulatory Visit: Payer: Self-pay | Admitting: Nurse Practitioner
